# Patient Record
Sex: Male | Born: 1950 | Race: White | Hispanic: No | Marital: Married | Smoking: Former smoker
Health system: Southern US, Community
[De-identification: ages and names within clinical notes are randomized; demographics above are authoritative.]

## PROBLEM LIST (undated history)

## (undated) DIAGNOSIS — F419 Anxiety disorder, unspecified: Secondary | ICD-10-CM

## (undated) DIAGNOSIS — N289 Disorder of kidney and ureter, unspecified: Secondary | ICD-10-CM

## (undated) DIAGNOSIS — M1990 Unspecified osteoarthritis, unspecified site: Secondary | ICD-10-CM

## (undated) DIAGNOSIS — F32A Depression, unspecified: Secondary | ICD-10-CM

## (undated) DIAGNOSIS — I10 Essential (primary) hypertension: Secondary | ICD-10-CM

## (undated) DIAGNOSIS — E785 Hyperlipidemia, unspecified: Secondary | ICD-10-CM

## (undated) DIAGNOSIS — Z87442 Personal history of urinary calculi: Secondary | ICD-10-CM

## (undated) DIAGNOSIS — G4730 Sleep apnea, unspecified: Secondary | ICD-10-CM

## (undated) DIAGNOSIS — D649 Anemia, unspecified: Secondary | ICD-10-CM

## (undated) DIAGNOSIS — N189 Chronic kidney disease, unspecified: Secondary | ICD-10-CM

## (undated) DIAGNOSIS — G2581 Restless legs syndrome: Secondary | ICD-10-CM

## (undated) DIAGNOSIS — K219 Gastro-esophageal reflux disease without esophagitis: Secondary | ICD-10-CM

## (undated) DIAGNOSIS — I219 Acute myocardial infarction, unspecified: Secondary | ICD-10-CM

## (undated) HISTORY — DX: Disorder of kidney and ureter, unspecified: N28.9

## (undated) HISTORY — PX: BACK SURGERY: SHX140

## (undated) HISTORY — PX: CATARACT EXTRACTION: SUR2

## (undated) HISTORY — PX: WRIST SURGERY: SHX841

---

## 1981-10-26 DIAGNOSIS — N289 Disorder of kidney and ureter, unspecified: Secondary | ICD-10-CM

## 1981-10-26 HISTORY — PX: OTHER SURGICAL HISTORY: SHX169

## 1981-10-26 HISTORY — DX: Disorder of kidney and ureter, unspecified: N28.9

## 2002-03-24 ENCOUNTER — Ambulatory Visit (HOSPITAL_COMMUNITY): Admission: RE | Admit: 2002-03-24 | Discharge: 2002-03-24 | Payer: Self-pay | Admitting: Internal Medicine

## 2005-01-09 ENCOUNTER — Ambulatory Visit: Payer: Self-pay | Admitting: Internal Medicine

## 2005-02-09 ENCOUNTER — Ambulatory Visit: Payer: Self-pay | Admitting: Internal Medicine

## 2005-02-23 ENCOUNTER — Ambulatory Visit (HOSPITAL_BASED_OUTPATIENT_CLINIC_OR_DEPARTMENT_OTHER): Admission: RE | Admit: 2005-02-23 | Discharge: 2005-02-23 | Payer: Self-pay | Admitting: Internal Medicine

## 2005-03-01 ENCOUNTER — Ambulatory Visit: Payer: Self-pay | Admitting: Internal Medicine

## 2005-03-09 ENCOUNTER — Ambulatory Visit: Payer: Self-pay | Admitting: Internal Medicine

## 2005-04-06 ENCOUNTER — Ambulatory Visit: Payer: Self-pay | Admitting: Internal Medicine

## 2005-10-12 ENCOUNTER — Ambulatory Visit: Payer: Self-pay | Admitting: Internal Medicine

## 2005-11-27 ENCOUNTER — Ambulatory Visit (HOSPITAL_COMMUNITY): Admission: RE | Admit: 2005-11-27 | Discharge: 2005-11-27 | Payer: Self-pay | Admitting: Pulmonary Disease

## 2005-12-16 ENCOUNTER — Ambulatory Visit: Payer: Self-pay | Admitting: Cardiology

## 2005-12-28 ENCOUNTER — Ambulatory Visit: Payer: Self-pay

## 2006-01-04 ENCOUNTER — Ambulatory Visit: Payer: Self-pay | Admitting: Cardiology

## 2006-01-08 ENCOUNTER — Ambulatory Visit: Payer: Self-pay | Admitting: Cardiology

## 2006-01-08 ENCOUNTER — Inpatient Hospital Stay (HOSPITAL_BASED_OUTPATIENT_CLINIC_OR_DEPARTMENT_OTHER): Admission: RE | Admit: 2006-01-08 | Discharge: 2006-01-08 | Payer: Self-pay | Admitting: Cardiology

## 2006-08-03 ENCOUNTER — Ambulatory Visit: Payer: Self-pay | Admitting: Internal Medicine

## 2006-09-03 ENCOUNTER — Ambulatory Visit: Payer: Self-pay | Admitting: Internal Medicine

## 2007-08-29 ENCOUNTER — Ambulatory Visit: Payer: Self-pay | Admitting: Internal Medicine

## 2007-11-25 ENCOUNTER — Ambulatory Visit (HOSPITAL_COMMUNITY): Admission: RE | Admit: 2007-11-25 | Discharge: 2007-11-25 | Payer: Self-pay | Admitting: Pulmonary Disease

## 2008-05-14 ENCOUNTER — Ambulatory Visit (HOSPITAL_COMMUNITY): Admission: RE | Admit: 2008-05-14 | Discharge: 2008-05-14 | Payer: Self-pay | Admitting: Pulmonary Disease

## 2008-06-21 ENCOUNTER — Ambulatory Visit (HOSPITAL_COMMUNITY): Admission: RE | Admit: 2008-06-21 | Discharge: 2008-06-21 | Payer: Self-pay | Admitting: Preventative Medicine

## 2008-08-29 ENCOUNTER — Encounter: Admission: RE | Admit: 2008-08-29 | Discharge: 2008-08-29 | Payer: Self-pay | Admitting: Preventative Medicine

## 2008-09-13 ENCOUNTER — Encounter: Admission: RE | Admit: 2008-09-13 | Discharge: 2008-09-13 | Payer: Self-pay | Admitting: Preventative Medicine

## 2008-10-05 ENCOUNTER — Encounter: Admission: RE | Admit: 2008-10-05 | Discharge: 2008-10-05 | Payer: Self-pay | Admitting: Preventative Medicine

## 2008-10-26 HISTORY — PX: OTHER SURGICAL HISTORY: SHX169

## 2009-07-12 ENCOUNTER — Encounter: Admission: RE | Admit: 2009-07-12 | Discharge: 2009-07-12 | Payer: Self-pay | Admitting: Neurological Surgery

## 2009-08-23 ENCOUNTER — Encounter: Admission: RE | Admit: 2009-08-23 | Discharge: 2009-08-23 | Payer: Self-pay | Admitting: Neurological Surgery

## 2009-11-19 ENCOUNTER — Encounter: Admission: RE | Admit: 2009-11-19 | Discharge: 2009-11-19 | Payer: Self-pay | Admitting: Neurological Surgery

## 2011-03-10 ENCOUNTER — Other Ambulatory Visit: Payer: Self-pay | Admitting: Neurological Surgery

## 2011-03-10 DIAGNOSIS — M542 Cervicalgia: Secondary | ICD-10-CM

## 2011-03-13 NOTE — Assessment & Plan Note (Signed)
[name]                               PULMONARY OFFICE NOTE   [name], [name]                      MRN:          [id number]  DATE:08/05/2006                            DOB:          02-05-1951    PULMONARY SLEEP FOLLOWUP   DATE OF VISIT:  August 03, 2006.   PRIMARY CARE PHYSICIAN:  Johnny L. Juanetta Gosling, MD.   PRIMARY CARDIOLOGIST:  Johnny Sans. Wall, MD, [facility].   PROBLEM:  1. Obstructive sleep apnea.  2. Periodic limb movement with arousal.   HISTORY:  He was last here in December when he said he was doing great on  CPAP at 17 CWP.  Since then, he has had cataract surgeries and a negative  heart cath.  He says CPAP worked great for a year with no daytime  sleepiness, then seemed to stop working.  By this, he means he began waking  up around 3 a.m., tossing and turning, and then feeling tired during the day  the way he did before he started CPAP.  He denies any discomfort from the  CPAP system, and he denies that he is snoring.  He also denies that he is  under any particular stresses or reason to be waking up at night.  He does  have Ambien.  He went on a three-week trip to [place] without taking his CPAP  and says he slept great as long as he took Ambien.  I discussed this and  explained to him that the Ambien would make it harder for him to wake when  he needed to get a breath.  Since he is not sleeping as well, he says he is  eating more and exercising less and is thus putting on weight.   MEDICATIONS:  1. Elavil 25 mg.  2. Lipitor.  3. CPAP at 17 CWP.  4. Ambien 5 to 10 mg p.r.n.   No medication allergy.   OBJECTIVE:  Weight 225 pounds, BP 102/62, pulse 71, room air saturation 95%.  He is alert, somewhat overweight, but down 12 pounds from December.  There  is no neck vein distention or stridor; no obvious nasal congestion.  Lungs  are clear.   IMPRESSION:  Obstructive sleep apnea with an insomnia component.   PLAN:  We are  having [facility] do an auto-titration to check his  pressure control.  I discussed treatment options.  Scheduled a return in one  month, earlier p.r.n.       Johnny D. Maple Hudson, MD, FCCP, FACP      CDY/MedQ  DD:  08/05/2006  DT:  08/07/2006  Job #:  [id number]   cc:   [name], M.D.  Johnny C. Wall, MD, [facility]

## 2011-03-13 NOTE — Assessment & Plan Note (Signed)
[facility]                               PULMONARY OFFICE NOTE   [name]                      MRN:          [id number]  DATE:09/03/2006                            DOB:          1951/08/31    PULMONARY/SLEEP FOLLOWUP   PRIMARY CARE:  Dr. Kari Baars.   CARDIOLOGIST:  Dr. Valera Castle.   PROBLEMS:  1. Obstructive sleep apnea.  2. Periodic limb movement with arousal.   HISTORY:  He says he still is not sleeping as well as he did during the  first 6 or 8 months of CPAP, but he has done better since we reduced his  pressure to 12 CWP after auto-titration.  Sleep onset is okay, but he is  waking around 2 or 3 a.m. for a few hours.  This is prevented if he takes  1/2 of a 5 mg Ambien tablet, but he was not sure about long-term use.  He  does not recognize causes for sleep disturbance.  He is comfortable with his  CPAP and told that he is not snoring through it.  We spent time discussing  sleep hygiene, management of sleep apnea, and of insomnia, and a discussion  of sleep medication use.   MEDICATIONS:  1. Elavil 25 mg.  2. Lipitor.  3. CPAP at 12 CWP.  4. Ambien 1/2 x5 mg.   ALLERGIES:  No medication allergy.   OBJECTIVE:  Weight 225 pounds, which is stable.  BP 124/78, pulse regular  77, room air saturation 97%.  He looks alert and comfortable.  There are no pressure marks on his face from the mask and no obvious nasal  congestion.  No tremor.  Pulses regular.   IMPRESSION:  1. Obstructive sleep apnea, continuous positive airway pressure of 12      seems adequate, but if there is any possibility that he would be more      comfortable at a lower pressure without breakthrough, we agreed we      would try it.  2. Waking after sleep onset/insomnia, nonspecific.   PLAN:  1. [name] will reduce his CPAP from 12 to 11 CWP for trial.  2. We discussed alternatives to the Ambien, suggesting he might want to  try Tylenol P.M. occasionally, but I also gave him permission to try      increasing his Elavil to 50 mg at bedtime for trial mainly to breakup      the emotional expectation of dependence on Ambien.  3. Schedule return 1 year, earlier p.r.n., but he is encouraged to call if      he has problems with any of this.     Johnny D. Maple Hudson, MD, [name], FACP  Electronically Signed    CDY/MedQ  DD: 09/05/2006  DT: 09/05/2006  Job #: [id number]   cc:   [name], M.D.

## 2011-03-13 NOTE — Assessment & Plan Note (Signed)
[facility]                             PULMONARY OFFICE NOTE   [name], [name]                      MRN:          [id number]  DATE:08/29/2007                            DOB:          [birth year]    PROBLEM:  1. Obstructive sleep apnea.  2. Periodic limb movement with arousal.   HISTORY:  A one-year followup.  He feels he is doing well.  He is taking  1/2 of an Ambien 10 mg tab most nights for sleep, but with this  comfortable with CPAP at 11 CWP.  He gets at least seven hours of sleep  and feels well with this.  Complains of weight gain despite exercise,  which we discussed.  He has had a flu vaccine.   MEDICATIONS:  1. Elavil 25 mg.  2. CPAP at 11 CWP.  3. Crestor.  4. Ambien.   ALLERGIES:  No known drug allergies.   OBJECTIVE:  VITAL SIGNS:  Weight 239 pounds, blood pressure 136/70,  pulse 82, room air saturation 98%.  GENERAL:  He is overweight, alert.  HEENT:  There are no pressures on his face from CPAP.  Palate spacing is  2/4.  LUNGS:  Fields are clear.  HEART:  Pulse is regular.   IMPRESSION:  Obstructive sleep apnea, well-controlled, but I am  concerned that he has gained some weight and this was discussed.   PLAN:  Weight loss.  Try Tylenol PM occasionally if needed as an  alternative to Ambien, with sleep hygiene discussion and medication  discussion.   FOLLOWUP:  Schedule a return appointment in one year or earlier p.r.n.     Johnny D. Maple Hudson, MD, [name], FACP  Electronically Signed    CDY/MedQ  DD: 09/03/2007  DT: 09/04/2007  Job #: [id number]   cc:   Ramon Dredge L. Juanetta Gosling, M.D.

## 2011-03-13 NOTE — Procedures (Signed)
NAMEEATHON, VALADE               ACCOUNT NO.:  [id number]   MEDICAL RECORD NO.:  [id number]          PATIENT TYPE:  OUT   LOCATION:  SLEEP CENTER                 FACILITY:  [facility]   PHYSICIAN:  Clinton D. Maple Hudson, M.D. DATE OF BIRTH:  04/22/1951   DATE OF STUDY:  02/23/2005                              NOCTURNAL POLYSOMNOGRAM   STUDY DATE:  Feb 23, 2005   REFERRING PHYSICIAN:  Dr. Jetty Duhamel   INDICATION FOR STUDY:  Hypersomnia with sleep apnea.  Epworth Sleepiness  Score 13/24, BMI 33, weight 215 pounds.   SLEEP ARCHITECTURE:  Total sleep time 411 minutes with sleep efficiency 86%.  Stage I was 4%, stage II 44%, stages III and IV were absent, REM was 32% of  total sleep time.  Sleep latency 26 minutes, REM latency 71 minutes, awake  after sleep onset 43 minutes, arousal index increased at 35.  He took Elavil  at bedtime.   RESPIRATORY DATA:  Split-study protocol.  Respiratory disturbance index  (RDI, AHI) 58.2 obstructive events per hour indicating severe obstructive  sleep apnea/hypopnea syndrome before CPAP.  There was 90 obstructive apneas  and 25 hypopneas before CPAP.  Events were most common while lying supine.  REM RDI 51.  CPAP was titrated to 17 CWP, RDI 1.5 per hour.  A ResMed Swift  with medium nasal pillows was used, with a heated humidifier.   OXYGEN DATA:  Moderate to loud snoring with oxygen desaturation to a nadir  of 78% before CPAP.  After CPAP control saturation held 97-98% on room air.   CARDIAC DATA:  Normal sinus rhythm.   MOVEMENT/PARASOMNIA:  A total of 24 limb jerks were recorded of which 15  were associated with arousal or awakening for a periodic limb movement with  arousal index of 2.2 per hour which was mildly increased.   IMPRESSION/RECOMMENDATION:  1.  Severe obstructive sleep apnea/hypopnea syndrome, respiratory      disturbance index 58.2 per hour with moderately loud snoring and oxygen      desaturation to a nadir of 78%.  2.  Continuous  positive airway pressure titration to 17 CWP, respiratory      disturbance index 1.5 per hour using a ResMed Swift with medium nasal      pillows and a heated humidifier.      CDY/MEDQ  D:  03/01/2005 10:24:07  T:  03/01/2005 11:41:37  Job:  [id number]

## 2011-03-13 NOTE — Procedures (Signed)
[name], [name]               ACCOUNT NO.:  [id number]   MEDICAL RECORD NO.:  [id number]          PATIENT TYPE:  OUT   LOCATION:  RAD                           FACILITY:  [facility]   PHYSICIAN:  Edward L. Juanetta Gosling, M.D.DATE OF BIRTH:  12/12/1950   DATE OF PROCEDURE:  DATE OF DISCHARGE:                              PULMONARY FUNCTION TEST   1.  Spirometry is normal.  2.  Lung volumes are normal.  3.  DLCO is normal.  4.  Arterial blood gases are normal.      Edward L. Juanetta Gosling, M.D.  Electronically Signed     ELH/MEDQ  D:  11/28/2005  T:  11/28/2005  Job:  [id number]

## 2011-03-13 NOTE — Cardiovascular Report (Signed)
[name], [name]               ACCOUNT NO.:  [id number]   MEDICAL RECORD NO.:  [id number]          PATIENT TYPE:  OIB   LOCATION:  1962                         FACILITY:  [facility]   PHYSICIAN:  Charlies Constable, M.D. 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  He has no prior history of known heart  disease but has had some dyspnea on exertion.  He attributed this to some  increased weight but he saw Dr. Juanetta Gosling and then Dr. Daleen Squibb in referral and  Dr. Daleen Squibb did a Cardiolite scan which was borderline for inferior ischemia.  Dr. Daleen Squibb arranged for him to be studied today in the outpatient laboratory.   PROCEDURE:  The procedure was performed via the right femoral artery using  arterial sheath and 4 French preformed coronary catheters.  A front wall  arterial puncture was performed and Omnipaque contrast was used.  The  patient tolerated the procedure well and left the laboratory in satisfactory  condition.   RESULTS:  The aortic pressure was 104/68 with a mean of 85.  The left  ventricular  pressure is 104/17.   Left main coronary artery was free of significant disease.   Left anterior descending artery gave rise to two diagonal branches and two  septal perforators.  There was minimal irregularity in the LAD but no  significant obstruction.   The circumflex artery was a large dominant vessel that gave rise to a  marginal branch, three posterolateral branches, an atrial branch, and a  posterior descending branch.  These vessels were free of significant  disease.   The right coronary artery is a nondominant vessel and supplies two right  ventricular branches.  These vessels were free of significant disease.   The left ventriculogram performed in the RAO projection showed  good wall  motion with no areas of hypokinesis.  The estimated ejection fraction was  60%.   A distal aortogram was performed which showed patent renal arteries and no  significant aorto-iliac obstruction.   CONCLUSION:  Normal coronary angiography and left ventricular wall motion.   RECOMMENDATIONS:  Reassurance.  In view of these findings, I think there is  no cardiac etiology to explain the patient's dyspnea on exertion.  I will  plan to continue primary risk factor modification and follow up with Dr.  Juanetta Gosling.           ______________________________  Charlies Constable, M.D. [facility]     BB/MEDQ  D:  01/08/2006  T:  01/09/2006  Job:  [id number]   cc:   Ramon Dredge L. Juanetta Gosling, M.D.  Fax: [phone]   Jesse Sans. Wall, M.D.  [address]  [address]  [place]  [place] [address]

## 2011-03-20 ENCOUNTER — Ambulatory Visit
Admission: RE | Admit: 2011-03-20 | Discharge: 2011-03-20 | Disposition: A | Discharge status: Home / Self Care | Payer: PRIVATE HEALTH INSURANCE | Source: Ambulatory Visit | Attending: Neurological Surgery | Admitting: Neurological Surgery

## 2011-03-20 DIAGNOSIS — M542 Cervicalgia: Secondary | ICD-10-CM

## 2011-04-22 ENCOUNTER — Other Ambulatory Visit (HOSPITAL_COMMUNITY): Payer: Self-pay | Admitting: Pulmonary Disease

## 2011-04-22 DIAGNOSIS — R7989 Other specified abnormal findings of blood chemistry: Secondary | ICD-10-CM

## 2011-04-24 ENCOUNTER — Ambulatory Visit (HOSPITAL_COMMUNITY)
Admission: RE | Admit: 2011-04-24 | Discharge: 2011-04-24 | Disposition: A | Discharge status: Home / Self Care | Payer: PRIVATE HEALTH INSURANCE | Source: Ambulatory Visit | Attending: Pulmonary Disease | Admitting: Pulmonary Disease

## 2011-04-24 ENCOUNTER — Other Ambulatory Visit (HOSPITAL_COMMUNITY): Payer: Self-pay | Admitting: Pulmonary Disease

## 2011-04-24 DIAGNOSIS — M25519 Pain in unspecified shoulder: Secondary | ICD-10-CM

## 2011-04-24 DIAGNOSIS — M546 Pain in thoracic spine: Secondary | ICD-10-CM | POA: Insufficient documentation

## 2011-04-24 DIAGNOSIS — R7989 Other specified abnormal findings of blood chemistry: Secondary | ICD-10-CM

## 2011-04-24 DIAGNOSIS — N1330 Unspecified hydronephrosis: Secondary | ICD-10-CM | POA: Insufficient documentation

## 2011-04-24 DIAGNOSIS — R944 Abnormal results of kidney function studies: Secondary | ICD-10-CM | POA: Insufficient documentation

## 2011-04-24 DIAGNOSIS — M549 Dorsalgia, unspecified: Secondary | ICD-10-CM

## 2011-07-24 ENCOUNTER — Ambulatory Visit (HOSPITAL_BASED_OUTPATIENT_CLINIC_OR_DEPARTMENT_OTHER)
Admission: RE | Admit: 2011-07-24 | Discharge: 2011-07-24 | Disposition: A | Discharge status: Home / Self Care | Payer: PRIVATE HEALTH INSURANCE | Source: Ambulatory Visit | Attending: Urology | Admitting: Urology

## 2011-07-24 DIAGNOSIS — N1330 Unspecified hydronephrosis: Secondary | ICD-10-CM | POA: Insufficient documentation

## 2011-07-24 DIAGNOSIS — N135 Crossing vessel and stricture of ureter without hydronephrosis: Secondary | ICD-10-CM | POA: Insufficient documentation

## 2011-07-24 LAB — POCT I-STAT 4, (NA,K, GLUC, HGB,HCT)
Glucose, Bld: 97 mg/dL (ref 70–99)
HCT: 39 % (ref 39.0–52.0)
Hemoglobin: 13.3 g/dL (ref 13.0–17.0)
Potassium: 3.8 mEq/L (ref 3.5–5.1)
Sodium: 143 mEq/L (ref 135–145)

## 2011-08-02 NOTE — Op Note (Signed)
[name], [name]               ACCOUNT NO.:  [id number]  MEDICAL RECORD NO.:  [id number]  LOCATION:                                 FACILITY:  PHYSICIAN:  Nevaeha Finerty C. Vernie Ammons, M.D.  DATE OF BIRTH:  [birth year]  DATE OF PROCEDURE:  07/24/2011 DATE OF DISCHARGE:                              OPERATIVE REPORT   PREOPERATIVE DIAGNOSIS:  Right hydronephrosis of undetermined etiology.  POSTOPERATIVE DIAGNOSIS:  Right ureteropelvic junction obstruction.  SURGEON:  Taziyah Iannuzzi C. Vernie Ammons, MD  ANESTHESIA:  General.  BLOOD LOSS:  None.  DRAINS:  None.  SPECIMENS:  None.  COMPLICATIONS:  None.  INDICATIONS:  The patient is a 60 year old who was found to have an elevated creatinine of 1.9.  A renal ultrasound was obtained that revealed marked hydronephrosis on the right-hand side with very little parenchyma present and severe blunting of the calyces, indicating longstanding obstruction.  The patient reports having undergone a right ureterolithotomy by Dr. Vonita Moss in 1983, although operative notes are not available to confirm the location of the previous surgery.  His wife indicated the stones were located in the kidney.  The need to evaluate the cause of his obstruction and location was discussed as well as the procedure in detail.  The patient understands and has elected to proceed.  DESCRIPTION OF OPERATION:  After informed consent, the patient was brought to the major OR, placed on the table, and administered general anesthesia and then moved to the dorsal lithotomy position.  His genitalia was sterilely prepped and draped.  An official time-out was then performed.  Initially, the 22-French cystoscope with 12-degree lens was passed under direct vision down the urethra which was noted to be entirely normal. Prostatic urethra revealed no lesions.  The lateral lobes were nonobstructing.  He did have a slightly high bladder neck/median lobe, but this was nonobstructing.  Upon entering  the bladder, it was fully and systematically inspected, noted to be free of any tumor, stones, or inflammatory lesions.  Ureteral orifices were of normal configuration and position.  A 6-French cone-tip catheter was then passed through the cystoscope and a right retrograde pyelogram was performed in a standard fashion.  I injected full-strength contrast under direct fluoroscopic control and watched the contrast as it passed up the ureter which appeared entirely normal throughout its course without filling defect or mass effect.  As the ureter met the renal pelvic region, I noted jetting of the contrast into the renal pelvis which was capacious.  This appeared to be free of any mass effect or filling defect.  I then removed the cone-tip catheter and passed an open-ended 6-French ureteral catheter through the cystoscope and then guidewire through this and up the ureter under fluoroscopy and noted this passed easily into the area of the renal pelvis.  I passed the open-ended catheter up to the renal pelvic region and into the renal pelvis and removed the guidewire and noted hydronephrotic drip.  I then injected contrast as I pulled the 6-French open-ended catheter down the ureter and through the UPJ which helped to me delineate this and I noted it was somewhat tortuous just below the UPJ, but again no filling defects or  worrisome findings were noted.  I, therefore, removed the open-ended ureteral catheter, drained the bladder, and the patient was awakened and taken to recovery room in stable and satisfactory condition.  He tolerated the procedure well, there were no intraoperative complications.  At this point, it would appear that he has a benign right UPJ obstruction whether this is congenital or equally possible acquired due to his previous surgery is undetermined, but of no clinical significance at this point since the right kidney has virtually no significant parenchymal mass and  therefore a UPJ repair would not be indicated.  I discussed that with the patient's wife and at this point, he will return to see me on an as-needed basis.     Betul Brisky C. Vernie Ammons, M.D.     MCO/MEDQ  D:  07/24/2011  T:  07/24/2011  Job:  [id number]  cc:   Ramon Dredge L. Juanetta Gosling, M.D. Fax: [phone]  Electronically Signed by Ihor Gully M.D. on 08/02/2011 03:09:48 PM

## 2011-10-14 ENCOUNTER — Other Ambulatory Visit: Payer: Self-pay | Admitting: Neurological Surgery

## 2011-10-14 DIAGNOSIS — M542 Cervicalgia: Secondary | ICD-10-CM

## 2011-10-23 ENCOUNTER — Other Ambulatory Visit: Payer: PRIVATE HEALTH INSURANCE

## 2011-10-23 ENCOUNTER — Ambulatory Visit
Admission: RE | Admit: 2011-10-23 | Discharge: 2011-10-23 | Disposition: A | Discharge status: Home / Self Care | Payer: PRIVATE HEALTH INSURANCE | Source: Ambulatory Visit | Attending: Neurological Surgery | Admitting: Neurological Surgery

## 2011-10-23 DIAGNOSIS — M542 Cervicalgia: Secondary | ICD-10-CM

## 2011-10-23 MED ORDER — TRIAMCINOLONE ACETONIDE 40 MG/ML IJ SUSP (RADIOLOGY)
60.0000 mg | Freq: Once | INTRAMUSCULAR | Status: AC
  Administered 2011-10-23: 60 mg via EPIDURAL

## 2011-10-23 MED ORDER — IOHEXOL 300 MG/ML  SOLN
1.0000 mL | Freq: Once | INTRAMUSCULAR | Status: AC | PRN
  Administered 2011-10-23: 1 mL via EPIDURAL

## 2011-10-23 NOTE — Progress Notes (Signed)
1145  Ambulates alone, gait steady.  Denies numbness or tingling of extremities.  Given verbal & written discharge instructions & states that he understands.  1150 Dr Karin Golden here to see patient.  1205  Patient's wife available to drive.  Discharged to home.

## 2011-11-22 DIAGNOSIS — I10 Essential (primary) hypertension: Secondary | ICD-10-CM | POA: Insufficient documentation

## 2011-12-01 ENCOUNTER — Other Ambulatory Visit (HOSPITAL_COMMUNITY): Payer: Self-pay | Admitting: Pulmonary Disease

## 2011-12-01 DIAGNOSIS — R591 Generalized enlarged lymph nodes: Secondary | ICD-10-CM

## 2011-12-03 ENCOUNTER — Ambulatory Visit (HOSPITAL_COMMUNITY)
Admission: RE | Admit: 2011-12-03 | Discharge: 2011-12-03 | Disposition: A | Discharge status: Home / Self Care | Payer: PRIVATE HEALTH INSURANCE | Source: Ambulatory Visit | Attending: Pulmonary Disease | Admitting: Pulmonary Disease

## 2011-12-03 DIAGNOSIS — R599 Enlarged lymph nodes, unspecified: Secondary | ICD-10-CM | POA: Insufficient documentation

## 2011-12-03 DIAGNOSIS — R591 Generalized enlarged lymph nodes: Secondary | ICD-10-CM

## 2012-01-20 ENCOUNTER — Encounter (INDEPENDENT_AMBULATORY_CARE_PROVIDER_SITE_OTHER): Payer: Self-pay

## 2012-02-23 ENCOUNTER — Encounter (INDEPENDENT_AMBULATORY_CARE_PROVIDER_SITE_OTHER): Payer: Self-pay | Admitting: *Deleted

## 2012-02-24 ENCOUNTER — Other Ambulatory Visit: Payer: Self-pay | Admitting: Neurological Surgery

## 2012-02-24 DIAGNOSIS — M542 Cervicalgia: Secondary | ICD-10-CM

## 2012-02-26 ENCOUNTER — Ambulatory Visit
Admission: RE | Admit: 2012-02-26 | Discharge: 2012-02-26 | Disposition: A | Discharge status: Home / Self Care | Payer: PRIVATE HEALTH INSURANCE | Source: Ambulatory Visit | Attending: Neurological Surgery | Admitting: Neurological Surgery

## 2012-02-26 VITALS — BP 188/96 | HR 84

## 2012-02-26 DIAGNOSIS — M542 Cervicalgia: Secondary | ICD-10-CM

## 2012-02-26 MED ORDER — TRIAMCINOLONE ACETONIDE 40 MG/ML IJ SUSP (RADIOLOGY)
60.0000 mg | Freq: Once | INTRAMUSCULAR | Status: AC
  Administered 2012-02-26: 60 mg via EPIDURAL

## 2012-02-26 MED ORDER — IOHEXOL 300 MG/ML  SOLN
1.0000 mL | Freq: Once | INTRAMUSCULAR | Status: AC | PRN
  Administered 2012-02-26: 1 mL via EPIDURAL

## 2012-03-01 ENCOUNTER — Other Ambulatory Visit (HOSPITAL_COMMUNITY): Payer: Self-pay | Admitting: Pulmonary Disease

## 2012-03-01 DIAGNOSIS — R591 Generalized enlarged lymph nodes: Secondary | ICD-10-CM

## 2012-03-02 ENCOUNTER — Encounter (HOSPITAL_COMMUNITY): Payer: Self-pay

## 2012-03-02 ENCOUNTER — Ambulatory Visit (HOSPITAL_COMMUNITY)
Admission: RE | Admit: 2012-03-02 | Discharge: 2012-03-02 | Disposition: A | Discharge status: Home / Self Care | Payer: PRIVATE HEALTH INSURANCE | Source: Ambulatory Visit | Attending: Pulmonary Disease | Admitting: Pulmonary Disease

## 2012-03-02 DIAGNOSIS — R599 Enlarged lymph nodes, unspecified: Secondary | ICD-10-CM | POA: Insufficient documentation

## 2012-03-02 DIAGNOSIS — R591 Generalized enlarged lymph nodes: Secondary | ICD-10-CM

## 2012-03-02 DIAGNOSIS — K5710 Diverticulosis of small intestine without perforation or abscess without bleeding: Secondary | ICD-10-CM | POA: Insufficient documentation

## 2012-03-30 ENCOUNTER — Telehealth (INDEPENDENT_AMBULATORY_CARE_PROVIDER_SITE_OTHER): Payer: Self-pay | Admitting: *Deleted

## 2012-03-30 ENCOUNTER — Other Ambulatory Visit (INDEPENDENT_AMBULATORY_CARE_PROVIDER_SITE_OTHER): Payer: Self-pay | Admitting: *Deleted

## 2012-03-30 DIAGNOSIS — Z1211 Encounter for screening for malignant neoplasm of colon: Secondary | ICD-10-CM

## 2012-03-30 NOTE — Telephone Encounter (Signed)
Patient needs movi prep 

## 2012-04-01 MED ORDER — PEG-KCL-NACL-NASULF-NA ASC-C 100 G PO SOLR: 1.0000 | Freq: Once | ORAL | Status: DC

## 2012-05-11 ENCOUNTER — Encounter (HOSPITAL_COMMUNITY): Payer: Self-pay | Admitting: Pharmacy Technician

## 2012-05-16 ENCOUNTER — Telehealth (INDEPENDENT_AMBULATORY_CARE_PROVIDER_SITE_OTHER): Payer: Self-pay | Admitting: *Deleted

## 2012-05-16 NOTE — Telephone Encounter (Signed)
PCP/Requesting MD: Juanetta Gosling  Name & DOB: [name] 12/16/1950     Procedure: tcs  Reason/Indication:  screening  Has patient had this procedure before?  yes  If so, when, by whom and where?  2003  Is there a family history of colon cancer?  no  Who?  What age when diagnosed?    Is patient diabetic?   no      Does patient have prosthetic heart valve?  no  Do you have a pacemaker?  no  Has patient had joint replacement within last 12 months?  no  Is patient on Coumadin, Plavix and/or Aspirin? yes  Medications: asa 81 mg daily, crestor 10 mg daily, amitriptyline 25 mg daily, tramadol 50 mg bid, ambien 10 mg 4-5 nights a week, androgel daily, omeprazole 20 mg bid, multi vit, flax seed oil  Allergies: nkda  Medication Adjustment: asa 2 days  Procedure date & time: 05/25/12 at 1030

## 2012-05-16 NOTE — Telephone Encounter (Signed)
Agree 

## 2012-05-25 ENCOUNTER — Encounter (HOSPITAL_COMMUNITY): Payer: Self-pay | Admitting: *Deleted

## 2012-05-25 ENCOUNTER — Ambulatory Visit (HOSPITAL_COMMUNITY)
Admission: RE | Admit: 2012-05-25 | Discharge: 2012-05-25 | Disposition: A | Discharge status: Home / Self Care | Payer: PRIVATE HEALTH INSURANCE | Source: Ambulatory Visit | Attending: Internal Medicine | Admitting: Internal Medicine

## 2012-05-25 ENCOUNTER — Encounter (HOSPITAL_COMMUNITY)
Admission: RE | Disposition: A | Discharge status: Home / Self Care | Payer: Self-pay | Source: Ambulatory Visit | Attending: Internal Medicine

## 2012-05-25 DIAGNOSIS — K5730 Diverticulosis of large intestine without perforation or abscess without bleeding: Secondary | ICD-10-CM

## 2012-05-25 DIAGNOSIS — K644 Residual hemorrhoidal skin tags: Secondary | ICD-10-CM

## 2012-05-25 DIAGNOSIS — Z1211 Encounter for screening for malignant neoplasm of colon: Secondary | ICD-10-CM

## 2012-05-25 DIAGNOSIS — E785 Hyperlipidemia, unspecified: Secondary | ICD-10-CM | POA: Insufficient documentation

## 2012-05-25 DIAGNOSIS — D126 Benign neoplasm of colon, unspecified: Secondary | ICD-10-CM | POA: Insufficient documentation

## 2012-05-25 DIAGNOSIS — G4733 Obstructive sleep apnea (adult) (pediatric): Secondary | ICD-10-CM | POA: Insufficient documentation

## 2012-05-25 HISTORY — DX: Hyperlipidemia, unspecified: E78.5

## 2012-05-25 HISTORY — DX: Unspecified osteoarthritis, unspecified site: M19.90

## 2012-05-25 HISTORY — DX: Gastro-esophageal reflux disease without esophagitis: K21.9

## 2012-05-25 HISTORY — DX: Sleep apnea, unspecified: G47.30

## 2012-05-25 HISTORY — PX: COLONOSCOPY: SHX5424

## 2012-05-25 SURGERY — COLONOSCOPY
Anesthesia: Moderate Sedation

## 2012-05-25 MED ORDER — MIDAZOLAM HCL 5 MG/5ML IJ SOLN
INTRAMUSCULAR | Status: DC | PRN
  Administered 2012-05-25 (×3): 2 mg via INTRAVENOUS

## 2012-05-25 MED ORDER — STERILE WATER FOR IRRIGATION IR SOLN
Status: DC | PRN
  Administered 2012-05-25: 11:00:00

## 2012-05-25 MED ORDER — MIDAZOLAM HCL 5 MG/5ML IJ SOLN
INTRAMUSCULAR | Status: AC
  Filled 2012-05-25: qty 10

## 2012-05-25 MED ORDER — MEPERIDINE HCL 50 MG/ML IJ SOLN
INTRAMUSCULAR | Status: DC | PRN
  Administered 2012-05-25 (×2): 25 mg via INTRAVENOUS

## 2012-05-25 MED ORDER — MEPERIDINE HCL 50 MG/ML IJ SOLN
INTRAMUSCULAR | Status: AC
  Filled 2012-05-25: qty 1

## 2012-05-25 MED ORDER — SODIUM CHLORIDE 0.45 % IV SOLN
Freq: Once | INTRAVENOUS | Status: AC
  Administered 2012-05-25: 1000 mL via INTRAVENOUS

## 2012-05-25 NOTE — H&P (Signed)
[name] is an 61 y.o. male.   Chief Complaint: Patient is here for colonoscopy. HPI: Patient is 61 year old Caucasian male who is in for screening colonoscopy. Patient's last exam was 10 years ago. He denies change in bowel habits or rectal bleeding. Family history is negative for colorectal carcinoma.  Past Medical History  Diagnosis Date  . Hyperlipidemia   . Sleep apnea     uses Cpap  . GERD (gastroesophageal reflux disease)   . Arthritis     Past Surgical History  Procedure Date  . Kidney stone   . Left foot surgery     History reviewed. No pertinent family history. Social History:  reports that he has never smoked. He does not have any smokeless tobacco history on file. He reports that he does not drink alcohol or use illicit drugs.  Allergies: No Known Allergies  Medications Prior to Admission  Medication Sig Dispense Refill  . amitriptyline (ELAVIL) 25 MG tablet Take 25 mg by mouth at bedtime.      Marland Kitchen aspirin EC 81 MG tablet Take 81 mg by mouth daily.      . Flaxseed, Linseed, 1200 MG CAPS Take 1,200 mg by mouth 2 (two) times daily.      . Multiple Vitamins-Minerals (MULTIVITAMIN WITH MINERALS) tablet Take 1 tablet by mouth daily.      Marland Kitchen omeprazole (PRILOSEC) 20 MG capsule Take 20 mg by mouth 2 (two) times daily.      . peg 3350 powder (MOVIPREP) 100 G SOLR Take 1 kit (100 g total) by mouth once.  1 kit  0  . rosuvastatin (CRESTOR) 10 MG tablet Take 10 mg by mouth at bedtime.      . Testosterone (ANDROGEL PUMP) 1.25 GM/ACT (1%) GEL Place 1 % onto the skin daily.      . traMADol (ULTRAM) 50 MG tablet Take 50 mg by mouth 3 (three) times daily.      Marland Kitchen zolpidem (AMBIEN) 10 MG tablet Take 10 mg by mouth at bedtime.        No results found for this or any previous visit (from the past 48 hour(s)). No results found.  ROS  Blood pressure 155/90, pulse 80, temperature 97.9 F (36.6 C), temperature source Oral, resp. rate 18, height 5\' 7"  (1.702 m), weight 225 lb  (102.059 kg), SpO2 96.00%. Physical Exam  Constitutional: He appears well-developed and well-nourished.  HENT:  Mouth/Throat: Oropharynx is clear and moist.  Eyes: Conjunctivae are normal. No scleral icterus.  Neck: No thyromegaly present.  Cardiovascular: Normal rate, regular rhythm and normal heart sounds.   No murmur heard. Respiratory: Effort normal and breath sounds normal.  GI: Soft. He exhibits no distension and no mass. There is no tenderness.  Musculoskeletal: He exhibits no edema.  Lymphadenopathy:    He has no cervical adenopathy.  Neurological: He is alert.  Skin: Skin is warm and dry.     Assessment/Plan Average risk screening colonoscopy.  REHMAN,NAJEEB U 05/25/2012, 10:26 AM

## 2012-05-25 NOTE — Op Note (Signed)
COLONOSCOPY PROCEDURE REPORT  PATIENT:  [name]  MR#:  [id number] Birthdate:  [birth year], 61 y.o., male Endoscopist:  Dr. Malissa Hippo, MD Referred By:  Dr. Oneal Deputy. Juanetta Gosling, MD Procedure Date: 05/25/2012  Procedure:   Colonoscopy  Indications: Patient is 61 year old Caucasian male was undergoing average risk screening colonoscopy.  Informed Consent:  The procedure and risks were reviewed with the patient and informed consent was obtained.  Medications:  Demerol 50 mg IV Versed 6 mg IV  Description of procedure:  After a digital rectal exam was performed, that colonoscope was advanced from the anus through the rectum and colon to the area of the cecum, ileocecal valve and appendiceal orifice. The cecum was deeply intubated. These structures were well-seen and photographed for the record. From the level of the cecum and ileocecal valve, the scope was slowly and cautiously withdrawn. The mucosal surfaces were carefully surveyed utilizing scope tip to flexion to facilitate fold flattening as needed. The scope was pulled down into the rectum where a thorough exam including retroflexion was performed. Terminal ileum was also examined.  Findings:   Prep excellent. Normal terminal ileum. Small polyp ablated via cold biopsy from hepatic flexure. Few scattered diverticula at sigmoid colon. Normal rectal mucosa. Small hemorrhoids below the dentate line.  Therapeutic/Diagnostic Maneuvers Performed:  See above  Complications:  None  Cecal Withdrawal Time:  14 minutes  Impression:  Normal terminal ileum. Small polyp ablated via cold biopsy from hepatic flexure. Few diverticula at sigmoid colon. Small external hemorrhoids.  Recommendations:  Standard instructions given. I will be contacting patient with results of biopsy and further recommendations.  REHMAN,NAJEEB U  05/25/2012 10:58 AM  CC: Dr. Fredirick Maudlin, MD & Dr. Bonnetta Barry ref. provider found

## 2012-05-27 ENCOUNTER — Encounter (HOSPITAL_COMMUNITY): Payer: Self-pay | Admitting: Internal Medicine

## 2012-06-01 ENCOUNTER — Encounter (INDEPENDENT_AMBULATORY_CARE_PROVIDER_SITE_OTHER): Payer: Self-pay | Admitting: *Deleted

## 2012-12-01 ENCOUNTER — Other Ambulatory Visit (HOSPITAL_COMMUNITY): Payer: Self-pay | Admitting: Pulmonary Disease

## 2012-12-01 DIAGNOSIS — I880 Nonspecific mesenteric lymphadenitis: Secondary | ICD-10-CM

## 2012-12-02 ENCOUNTER — Ambulatory Visit (HOSPITAL_COMMUNITY): Payer: PRIVATE HEALTH INSURANCE

## 2012-12-06 ENCOUNTER — Ambulatory Visit (HOSPITAL_COMMUNITY)
Admission: RE | Admit: 2012-12-06 | Discharge: 2012-12-06 | Disposition: A | Discharge status: Home / Self Care | Payer: PRIVATE HEALTH INSURANCE | Source: Ambulatory Visit | Attending: Pulmonary Disease | Admitting: Pulmonary Disease

## 2012-12-06 ENCOUNTER — Other Ambulatory Visit (HOSPITAL_COMMUNITY): Payer: Self-pay | Admitting: Pulmonary Disease

## 2012-12-06 DIAGNOSIS — I880 Nonspecific mesenteric lymphadenitis: Secondary | ICD-10-CM

## 2012-12-06 DIAGNOSIS — M5030 Other cervical disc degeneration, unspecified cervical region: Secondary | ICD-10-CM | POA: Insufficient documentation

## 2012-12-06 DIAGNOSIS — M542 Cervicalgia: Secondary | ICD-10-CM | POA: Insufficient documentation

## 2012-12-06 DIAGNOSIS — M25569 Pain in unspecified knee: Secondary | ICD-10-CM | POA: Insufficient documentation

## 2012-12-06 DIAGNOSIS — K8020 Calculus of gallbladder without cholecystitis without obstruction: Secondary | ICD-10-CM | POA: Insufficient documentation

## 2012-12-06 DIAGNOSIS — M79609 Pain in unspecified limb: Secondary | ICD-10-CM | POA: Insufficient documentation

## 2012-12-20 ENCOUNTER — Ambulatory Visit (INDEPENDENT_AMBULATORY_CARE_PROVIDER_SITE_OTHER): Payer: PRIVATE HEALTH INSURANCE | Admitting: Orthopedic Surgery

## 2012-12-20 ENCOUNTER — Encounter: Payer: Self-pay | Admitting: Orthopedic Surgery

## 2012-12-20 VITALS — BP 124/68 | Ht 68.0 in | Wt 222.0 lb

## 2012-12-20 DIAGNOSIS — G5600 Carpal tunnel syndrome, unspecified upper limb: Secondary | ICD-10-CM | POA: Insufficient documentation

## 2012-12-20 DIAGNOSIS — M23321 Other meniscus derangements, posterior horn of medial meniscus, right knee: Secondary | ICD-10-CM

## 2012-12-20 MED ORDER — GABAPENTIN 100 MG PO CAPS: 100.0000 mg | ORAL_CAPSULE | Freq: Every day | ORAL | Status: DC

## 2012-12-20 MED ORDER — VITAMIN B-6 100 MG PO TABS: 100.0000 mg | ORAL_TABLET | Freq: Two times a day (BID) | ORAL | Status: DC

## 2012-12-20 NOTE — Progress Notes (Signed)
Patient ID: [name], male   DOB: 1951-05-03, 62 y.o.   MRN: [id number]  Chief Complaint  Patient presents with  . Knee Pain    Right knee pain , no injury, referred by Dr.Hawkins   A ring consultation has been requested by Dr. Juanetta Gosling to evaluate the patient for right knee pain and bilateral hand pain and numbness and tingling in bilateral thumb pain  The patient's right knee started hurting 6 months ago and he remembers a specific event. He had his knee in external rotation and slight flexion and a push and then and he felt an acute burning sensation over the medial joint line. He continues to have dull aching pain over the joint line inability to squat and difficulty performing activities such as woodworking which is very important to him  He has bilateral hand pain with numbness and tingling in all the fingers and digits except the small finger. The pain does not radiate from proximal to distal from distal to proximal. He has a history of cervical disc disease stable evaluated by neurosurgery no surgery needed. Feels this is different.  Prior to his hand symptoms he started having pain in both thumbs at the base the joint. This involved grip and pinch activities.  Review of systems 10 systems reviewed all were negative except the neurologic symptom that as described in the musculoskeletal symptoms as described  Past Medical History  Diagnosis Date  . Hyperlipidemia   . Sleep apnea     uses Cpap  . GERD (gastroesophageal reflux disease)   . Arthritis   . Kidney function abnormal     Past Surgical History  Procedure Laterality Date  . Kidney stone    . Left foot surgery    . Colonoscopy  05/25/2012    Procedure: COLONOSCOPY;  Surgeon: Malissa Hippo, MD;  Location: [facility];  Service: Endoscopy;  Laterality: N/A;  1030  . Cataract extraction     The other parameters of the history have been reviewed in Epic.  The vital signs are BP 124/68  Ht 5\' 8"  (1.727 m)  Wt 222  lb (100.699 kg)  BMI 33.76 kg/m2  Gen. appearance overall the patient has a mild amount of obesity with a BMI of 33. He is well-developed nutritional status is normal he has no deformities his grooming is excellent. He is oriented x3 his mood and affect are normal his gait and station appear normal with mild varus. His upper extremities have normal pulses and temperature no edema no swelling no lymph nodes are palpable gross sensation is normal but he does have the sensory deficit in the median nerve distribution. No pathologic reflexes and normal coordination.  On inspection he has no swelling in the hands he has some tenderness over the carpal tunnel range of motion is normal he has tenderness at the basal joints of the thumb. There is some motion there consistent with basal joint arthrosis remaining joints are stable strength is normal skin is intact  Left lower extremity normal range of motion strength stability alignment skin pulse. Normal sensation. No repeat X. abnormalities normal coordination normal pulse.  Right knee and right leg coordination is normal no pathologic reflexes sensation is normal negative for lymphadenopathy normal pulse and temperature normal skin normal strength normal ligaments. Painful range of motion in terminal range of motion positive screw home test, positive Murray's test, positive hyperflexion rotation test. Tenderness along the medial joint line.   X-rays were done of his hand  and his knee and are basically normal he has a little arthritis in the knee a little subluxation at the basal joint of the thumb  Diagnosis CTS (carpal tunnel syndrome), unspecified laterality - Plan: pyridOXINE (VITAMIN B-6) 100 MG tablet, gabapentin (NEURONTIN) 100 MG capsule  Medial meniscus, posterior horn derangement, right  CMC arthritis   Then recommend splinting, medication for his carpal tunnel  Injection for the medial meniscal tear and observation currently for the [facility]  arthritis  We discussed possible surgical option for the knee but he would like to try the injection.  Knee  Injection Procedure Note  Pre-operative Diagnosis: right knee oa  Post-operative Diagnosis: same  Indications: pain  Anesthesia: ethyl chloride   Procedure Details   Verbal consent was obtained for the procedure. Time out was completed.The joint was prepped with alcohol, followed by  Ethyl chloride spray and A 20 gauge needle was inserted into the knee via lateral approach; 4ml 1% lidocaine and 1 ml of depomedrol  was then injected into the joint . The needle was removed and the area cleansed and dressed.  Complications:  None; patient tolerated the procedure well.

## 2012-12-20 NOTE — Patient Instructions (Addendum)
You have received a steroid shot. 15% of patients experience increased pain at the injection site with in the next 24 hours. This is best treated with ice and tylenol extra strength 2 tabs every 8 hours. If you are still having pain please call the office.   Knee sleeve  Braces for carpal tunnel  Gabapentin and B6   Carpal Tunnel Syndrome The carpal tunnel is a narrow area located on the palm side of your wrist. The tunnel is formed by the wrist bones and ligaments. Nerves, blood vessels, and tendons pass through the carpal tunnel. Repeated wrist motion or certain diseases may cause swelling within the tunnel. This swelling pinches the main nerve in the wrist (median nerve) and causes the painful hand and arm condition called carpal tunnel syndrome. CAUSES   Repeated wrist motions.  Wrist injuries.  Certain diseases like arthritis, diabetes, alcoholism, hyperthyroidism, and kidney failure.  Obesity.  Pregnancy. SYMPTOMS   A "pins and needles" feeling in your fingers or hand.  Tingling or numbness in your fingers or hand.  An aching feeling in your entire arm.  Wrist pain that goes up your arm to your shoulder.  Pain that goes down into your palm or fingers.  A weak feeling in your hands. DIAGNOSIS  Your caregiver will take your history and perform a physical exam. An electromyography test may be needed. This test measures electrical signals sent out by the muscles. The electrical signals are usually slowed by carpal tunnel syndrome. You may also need X-rays. TREATMENT  Carpal tunnel syndrome may clear up by itself. Your caregiver may recommend a wrist splint or medicine such as a nonsteroidal anti-inflammatory medicine. Cortisone injections may help. Sometimes, surgery may be needed to free the pinched nerve.  HOME CARE INSTRUCTIONS   Take all medicine as directed by your caregiver. Only take over-the-counter or prescription medicines for pain, discomfort, or fever as directed  by your caregiver.  If you were given a splint to keep your wrist from bending, wear it as directed. It is important to wear the splint at night. Wear the splint for as long as you have pain or numbness in your hand, arm, or wrist. This may take 1 to 2 months.  Rest your wrist from any activity that may be causing your pain. If your symptoms are work-related, you may need to talk to your employer about changing to a job that does not require using your wrist.  Put ice on your wrist after long periods of wrist activity.  Put ice in a plastic bag.  Place a towel between your skin and the bag.  Leave the ice on for 15 to 20 minutes, 3 to 4 times a day.  Keep all follow-up visits as directed by your caregiver. This includes any orthopedic referrals, physical therapy, and rehabilitation. Any delay in getting necessary care could result in a delay or failure of your condition to heal. SEEK IMMEDIATE MEDICAL CARE IF:   You have new, unexplained symptoms.  Your symptoms get worse and are not helped or controlled with medicines. MAKE SURE YOU:   Understand these instructions.  Will watch your condition.  Will get help right away if you are not doing well or get worse. Document Released: 10/09/2000 Document Revised: 01/04/2012 Document Reviewed: 08/28/2011 [facility] Patient Information 2013 [place], [place]. Knee, Cartilage (Meniscus) Injury It is suspected that you have a torn cartilage (meniscus) in your knee. The menisci are made of tough cartilage, and fit between the surfaces of  the thigh and leg bones. The menisci are "C"shaped and have a wedged profile. The wedged profile helps the stability of the joint by keeping the rounded femur surface from sliding off the flat tibial surface. The menisci are fed (nourished) by small blood vessels; but there is also a large area at the inner edge of the meniscus that does not have a good blood supply (avascular). This presents a problem when there is an  injury to the meniscus, because areas without good blood supply heal poorly. As a result when there is a torn cartilage in the knee, surgery is often required to fix it. This is usually done with a surgical procedure less invasive than open surgery (arthroscopy). Some times open surgery of the knee is required if there is other damage. PURPOSE OF THE MENISCUS The medial meniscus rests on the medial tibial plateau. The tibia is the large bone in your lower leg (the shin bone). The medial tibial plateau is the upper end of the bone making up the inner part of your knee. The lateral meniscus serves the same purpose and is located on the outside of the knee. The menisci help to distribute your body weight across the knee joint; they act as shock absorbers. Without the meniscus present, the weight of your body would be unevenly applied to the bones in your legs (the femur and tibia). The femur is the large bone in your thigh. This uneven weight distribution would cause increased wear and tear on the cartilage lining the joint surfaces, leading to early damage (arthritis) of these areas. The presence of the menisci cartilage is necessary for a healthy knee. PURPOSE OF THE KNEE CARTILAGE The knee joint is made up of three bones: the thigh bone (femur), the shin bone (tibia), and the knee cap (patella). The surfaces of these bones at the knee joint are covered with cartilage called articular cartilage. This smooth slippery surface allows the bones to slide against each other without causing bone damage. The meniscus sits between these cartilaginous surfaces of the bones. It distributes the weight evenly in the joints and helps with the stability of the joint (keeps the joint steady). HOME CARE INSTRUCTIONS  Use crutches and external braces as instructed.  Once home an ice pack applied to your injured knee may help with discomfort and keep the swelling down. An ice pack can be used for the first couple of days or as  instructed.  Only take over-the-counter or prescription medicines for pain, discomfort, or fever as directed by your caregiver.  Call if you do not have relief of pain with medications or if there is increasing in pain.  Call if your foot becomes cold or blue.  You may resume normal diet and activities as directed.  Make sure to keep your appointment with your follow-up caregiver. This injury may require further evaluation and treatment beyond the temporary treatment given today. Document Released: 01/02/2003 Document Revised: 01/04/2012 Document Reviewed: 04/26/2009 [facility] Patient Information 2013 [place], [place].

## 2013-01-31 ENCOUNTER — Ambulatory Visit (INDEPENDENT_AMBULATORY_CARE_PROVIDER_SITE_OTHER): Payer: PRIVATE HEALTH INSURANCE | Admitting: Orthopedic Surgery

## 2013-01-31 VITALS — BP 118/62 | Ht 68.0 in | Wt 222.0 lb

## 2013-01-31 DIAGNOSIS — M19049 Primary osteoarthritis, unspecified hand: Secondary | ICD-10-CM

## 2013-01-31 DIAGNOSIS — M23329 Other meniscus derangements, posterior horn of medial meniscus, unspecified knee: Secondary | ICD-10-CM

## 2013-01-31 DIAGNOSIS — G5600 Carpal tunnel syndrome, unspecified upper limb: Secondary | ICD-10-CM

## 2013-01-31 NOTE — Progress Notes (Signed)
Patient ID: CHASE ARNALL, male   DOB: Oct 05, 1951, 62 y.o.   MRN: [id number] No diagnosis found.  Chief Complaint  Patient presents with  . Follow-up    6 week recheck right knee and CTS   Medial meniscus, posterior horn derangement, unspecified laterality  CTS (carpal tunnel syndrome), unspecified laterality  CMC arthritis   The patient reports that his knee is 90% better after injection and his carpal tunnel is 85% better after splinting and gabapentin   He still having pain in the left thumb over the base of the joint  He denies fever chills any new symptoms  Knee range of motion full without effusion  Hands no numbness or tingling carpal tunnel is nontender bilaterally  Thumb tender painful range of motion at the base of the joint  Recommend small joint injection into the [facility] The joint  Procedure note Preprocedure diagnosis CMC joint arthritis left thumb Post procedure same Procedure injection CMC joint Medication 1 cc of Depo-Medrol 40 mg 1 cc of lidocaine 1% Technique sterile prep with alcohol and ethyl chloride for anesthesia Injected CMC joint no complications

## 2013-01-31 NOTE — Patient Instructions (Addendum)
You have received a steroid shot. 15% of patients experience increased pain at the injection site with in the next 24 hours. This is best treated with ice and tylenol extra strength 2 tabs every 8 hours. If you are still having pain please call the office.    

## 2013-02-07 ENCOUNTER — Emergency Department (HOSPITAL_COMMUNITY)
Admission: EM | Admit: 2013-02-07 | Discharge: 2013-02-07 | Disposition: A | Discharge status: Home / Self Care | Payer: PRIVATE HEALTH INSURANCE | Attending: Emergency Medicine | Admitting: Emergency Medicine

## 2013-02-07 ENCOUNTER — Encounter (HOSPITAL_COMMUNITY): Payer: Self-pay | Admitting: *Deleted

## 2013-02-07 DIAGNOSIS — S62639B Displaced fracture of distal phalanx of unspecified finger, initial encounter for open fracture: Secondary | ICD-10-CM | POA: Insufficient documentation

## 2013-02-07 DIAGNOSIS — W312XXA Contact with powered woodworking and forming machines, initial encounter: Secondary | ICD-10-CM | POA: Insufficient documentation

## 2013-02-07 DIAGNOSIS — Y929 Unspecified place or not applicable: Secondary | ICD-10-CM | POA: Insufficient documentation

## 2013-02-07 DIAGNOSIS — Y9389 Activity, other specified: Secondary | ICD-10-CM | POA: Insufficient documentation

## 2013-02-07 HISTORY — DX: Disorder of kidney and ureter, unspecified: N28.9

## 2013-02-07 MED ORDER — OXYCODONE-ACETAMINOPHEN 5-325 MG PO TABS
2.0000 | ORAL_TABLET | Freq: Once | ORAL | Status: AC
  Administered 2013-02-07: 2 via ORAL
  Filled 2013-02-07: qty 2

## 2013-02-07 MED ORDER — OXYCODONE HCL 5 MG PO TABS: ORAL_TABLET | ORAL | Status: DC

## 2013-02-07 MED ORDER — CEPHALEXIN 500 MG PO CAPS: 500.0000 mg | ORAL_CAPSULE | Freq: Four times a day (QID) | ORAL | Status: DC

## 2013-02-07 NOTE — ED Provider Notes (Signed)
MSE was initiated and I personally evaluated the patient and placed orders (if any) at  5:01 PM on February 07, 2013.  The patient appears stable so that the remainder of the MSE may be completed by another provider (Pt sent to meet hand surgeon Dr. Amanda Pea in ED).  Hurman Horn, MD 02/07/13 [phone]

## 2013-02-07 NOTE — Consult Note (Signed)
  Dictation # [id number] See Dictation  Dx: open distal phalanx fracture due to table saw injury Johnny Severin MD

## 2013-02-07 NOTE — Discharge Summary (Signed)
  See full consult note Diagnosis: Status post table saw injury with reconstruction 02/07/2013 We in [place].D.

## 2013-02-07 NOTE — ED Notes (Signed)
Dr. Amanda Pea at bedside with suture cart.

## 2013-02-07 NOTE — ED Notes (Addendum)
Pt comes from [facility] in [place] for laceration to left thumb as result of table saw. Reporting bone involvement. Given tetanus vaccine, 1 g rocephin IM. Coming private vehicle

## 2013-02-07 NOTE — ED Notes (Signed)
Pt sent here from UC in [place] for fracture/lac to L thumb d/t table saw accident.  Pt rcvd IM Rocephin, tetanus and a wound dressing, but no pain meds.

## 2013-02-08 NOTE — Consult Note (Signed)
NAMEBRAD, LIEURANCE               ACCOUNT NO.:  [id number]  MEDICAL RECORD NO.:  [id number]  LOCATION:  B16C                         FACILITY:  [facility]  PHYSICIAN:  Dionne Ano. Kinta Martis, M.D.DATE OF BIRTH:  Jan 18, 1951  DATE OF CONSULTATION: DATE OF DISCHARGE:  02/07/2013                                CONSULTATION   I had the pleasure to see [name].  He was referred from [facility] by Dr. Wende Crease.  He had a Skil saw/table saw type injury to his left thumb with open bony fracture, nail plate, and nail bed injury.  He is here with his wife.  He denies other issues.  He sees Dr. Fuller Canada for his regular orthopedic needs in the [place] area.  He is a retired Environmental manager.  He is a delightful gentleman.  I have reviewed his medicines, records, etc.  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  MEDICINES:  Tramadol, Ambien, Androgel, Crestor, vitamin B6, Prilosec, aspirin, and Elavil as well as Microzide.  ALLERGIES:  The patient has no known drug allergies.  He was given an antibiotic injection.  I have reviewed his x-rays from the urgent care and his chart at length.  He complains of pain in the thumb.  PHYSICAL EXAMINATION:  GENERAL:  He is a male, alert and oriented, in no acute distress. VITAL SIGNS:  Stable. PELVIS:  Stable. ABDOMEN:  Nontender, slightly obese. HEENT:  Within normal limits. CHEST:  Clear. NECK AND BACK:  Nontender. EXTREMITIES:  He has full sensation to the elbow, wrist, and forearm, right upper extremity.  Lower extremity examination is stable.  Left upper extremity has Skil saw injury to the pulp involving the bone.  His nail plate and nail bed are involved as well unfortunately.  X-rays do verify the open fracture.  IMPRESSION:  Left thumb saw injury with open bony fracture as well as nail plate and nail bed injury.  PLAN:  He was consented for I  and D and repair as necessary.  PROCEDURE NOTE:  The patient was seen by myself and taken to the procedure suite, underwent an intermetacarpal block to the thumb and median nerve as well as superficial radial nerve block with lidocaine with epinephrine.  Following this, he was prepped and draped in usual sterile fashion with 3 separate Betadine scrubs.  I was very meticulous in my scrub and sterile technique to make sure that we really got a good I and D.  Following this, he was irrigated with between 2 and 3 L of saline.  I performed irrigation debridement of skin, subcutaneous tissue, bone, periosteal tissue, nail plate, nail bed.  This was an excisional debridement with scissor tip, scalpel, and a curette.  Following this and copious irrigation, the bone was then set.  This was open treatment of an open distal phalanx fracture.  Following this, I performed nail plate removal, followed by a nail bed repair.  I was able to place a little sling flap to get a good coverage over the bone as there was a portion of it missing due to the defect created by  the saw.  He underwent nail plate removal, I and D of an open fracture, treatment of an open distal phalanx fracture, as well as volar advancement flap, and nail bed repair complex in nature under 4.0 loupe magnification.  He tolerated the procedure well.  I did perform a volar advancement flap and repair of a 1 inch laceration (2.5 cm).  Following this, I placed Adaptic under the eponychial fold to prevent nail bed adherence and dressed the thumb with further Adaptic, Xeroform and a thumb spica splint.  He tolerated this well.  There were no complicating features. He had excellent refill at the conclusion of the case.  The tourniquet was insufflated for only a brief portion of time.  I have discussed with him Keflex 500 mg 1 p.o. q.i.d., oxycodone 5 mg 1-2 q.4-6 h. p.r.n. pain p.o., and he will return to see me in 8-10 days for  followup with Therapy seeing him immediately following.  I have discussed him should any problems arise, he will notify me.  Otherwise, we will look forward to seeing him back at that time.  It has been absolute pleasure to see him today.  He understands our plans and all issues, etc.  Should any problems arise, he will notify us.  Once again, this was a full consultation as well as irrigation and debridement, open fracture, treatment of an open fracture, nail plate removal, nail bed repair, and volar advancement flap of the defect with suturing of greater than an inch injury.     Dionne Ano. Amanda Pea, M.D.     [facility]  D:  02/07/2013  T:  02/08/2013  Job:  [id number]

## 2013-04-13 ENCOUNTER — Ambulatory Visit (INDEPENDENT_AMBULATORY_CARE_PROVIDER_SITE_OTHER): Payer: PRIVATE HEALTH INSURANCE | Admitting: Orthopedic Surgery

## 2013-04-13 VITALS — BP 118/70 | Ht 68.0 in | Wt 222.0 lb

## 2013-04-13 DIAGNOSIS — M23329 Other meniscus derangements, posterior horn of medial meniscus, unspecified knee: Secondary | ICD-10-CM

## 2013-04-13 NOTE — Patient Instructions (Addendum)
You have received a steroid shot. 15% of patients experience increased pain at the injection site with in the next 24 hours. This is best treated with ice and tylenol extra strength 2 tabs every 8 hours. If you are still having pain please call the office.  Call if decide on surgery or repeat injection

## 2013-04-14 ENCOUNTER — Encounter: Payer: Self-pay | Admitting: Orthopedic Surgery

## 2013-04-14 NOTE — Progress Notes (Signed)
Patient ID: [name], male   DOB: May 30, 1951, 62 y.o.   MRN: [id number] Chief Complaint  Patient presents with  . Follow-up    Recheck right knee, requesting injection.    Diagnosis medial meniscal tear right knee  Patient received injection in the past did well and has requested another.  Knee  Injection Procedure Note  Pre-operative Diagnosis: right knee oa  Post-operative Diagnosis: same  Indications: pain  Anesthesia: ethyl chloride   Procedure Details   Verbal consent was obtained for the procedure. Time out was completed.The joint was prepped with alcohol, followed by  Ethyl chloride spray and A 20 gauge needle was inserted into the knee via lateral approach; 4ml 1% lidocaine and 1 ml of depomedrol  was then injected into the joint . The needle was removed and the area cleansed and dressed.  Complications:  None; patient tolerated the procedure well.

## 2013-06-21 ENCOUNTER — Telehealth: Payer: Self-pay | Admitting: Orthopedic Surgery

## 2013-06-21 NOTE — Telephone Encounter (Signed)
Called patient and told him to have pharmacy fax a refill request.

## 2013-06-22 ENCOUNTER — Other Ambulatory Visit: Payer: Self-pay | Admitting: *Deleted

## 2013-06-22 DIAGNOSIS — G5600 Carpal tunnel syndrome, unspecified upper limb: Secondary | ICD-10-CM

## 2013-06-22 MED ORDER — GABAPENTIN 100 MG PO CAPS: 100.0000 mg | ORAL_CAPSULE | Freq: Every day | ORAL | Status: DC

## 2013-12-11 ENCOUNTER — Other Ambulatory Visit (HOSPITAL_COMMUNITY): Payer: Self-pay | Admitting: Pulmonary Disease

## 2013-12-11 DIAGNOSIS — R109 Unspecified abdominal pain: Secondary | ICD-10-CM

## 2013-12-14 ENCOUNTER — Ambulatory Visit (HOSPITAL_COMMUNITY)
Admission: RE | Admit: 2013-12-14 | Discharge: 2013-12-14 | Disposition: A | Discharge status: Home / Self Care | Payer: PRIVATE HEALTH INSURANCE | Source: Ambulatory Visit | Attending: Pulmonary Disease | Admitting: Pulmonary Disease

## 2013-12-14 DIAGNOSIS — K8020 Calculus of gallbladder without cholecystitis without obstruction: Secondary | ICD-10-CM | POA: Insufficient documentation

## 2013-12-14 DIAGNOSIS — R109 Unspecified abdominal pain: Secondary | ICD-10-CM | POA: Insufficient documentation

## 2013-12-14 DIAGNOSIS — N269 Renal sclerosis, unspecified: Secondary | ICD-10-CM | POA: Insufficient documentation

## 2013-12-14 DIAGNOSIS — N135 Crossing vessel and stricture of ureter without hydronephrosis: Secondary | ICD-10-CM | POA: Insufficient documentation

## 2013-12-14 DIAGNOSIS — E785 Hyperlipidemia, unspecified: Secondary | ICD-10-CM | POA: Insufficient documentation

## 2013-12-14 MED ORDER — IOHEXOL 300 MG/ML  SOLN
80.0000 mL | Freq: Once | INTRAMUSCULAR | Status: AC | PRN
  Administered 2013-12-14: 80 mL via INTRAVENOUS

## 2014-01-23 ENCOUNTER — Ambulatory Visit: Payer: PRIVATE HEALTH INSURANCE | Admitting: Orthopedic Surgery

## 2014-03-14 ENCOUNTER — Other Ambulatory Visit: Payer: Self-pay | Admitting: Dermatology

## 2014-05-01 ENCOUNTER — Ambulatory Visit (INDEPENDENT_AMBULATORY_CARE_PROVIDER_SITE_OTHER): Payer: PRIVATE HEALTH INSURANCE | Admitting: Orthopedic Surgery

## 2014-05-01 VITALS — BP 142/86 | Ht 68.0 in | Wt 222.0 lb

## 2014-05-01 DIAGNOSIS — IMO0002 Reserved for concepts with insufficient information to code with codable children: Secondary | ICD-10-CM

## 2014-05-01 DIAGNOSIS — S83207A Unspecified tear of unspecified meniscus, current injury, left knee, initial encounter: Secondary | ICD-10-CM

## 2014-05-01 MED ORDER — HYDROCODONE-ACETAMINOPHEN 5-325 MG PO TABS: 1.0000 | ORAL_TABLET | Freq: Four times a day (QID) | ORAL | Status: DC | PRN

## 2014-05-01 NOTE — Progress Notes (Signed)
Patient ID: [name], male   DOB: Jul 13, 1951, 63 y.o.   MRN: [id number]  Chief complaint left knee pain x10 weeks  Acute onset left knee pain 10 weeks ago. Patient presents as a 63 year old male who with pain stiffness and giving way symptoms of his left knee. He complains that the knee is on fire with aching pain radiating across the front of the knee medial joint line which has become constant. He says is controlling his life he did get some mild relief with ice. He did not get good relief with prednisone. He says it hurts more sitting and walking.  Current medications Elavil Crestor Ambien tramadol AndroGel hydrochlorothiazide omeprazole multivitamin  No known drug allergies  System review ringing in the ears heartburn joint pain and limb pain swelling of the left knee  Visual disturbance numbness and tingling recent hematuria currently scheduled to have MRI for further workup as he has 1 functioning kidney  Vital signs: BP 142/86  Ht 5\' 8"  (1.727 m)  Wt 222 lb (100.699 kg)  BMI 33.76 kg/m2   General the patient is well-developed and well-nourished grooming and hygiene are normal Oriented x3 Mood and affect normal Ambulation he has a left leg limping gait with pain radiating up into his hip secondary to pain in his knee  medial joint line is tender  passive range of motion is normal Provocative test torn medial meniscus indicated by positive McMurray sign and positive screw home test.   All ligaments tested and were stable muscle tone and strength normal skin clean dry and intact no rash puncture  Cardiovascular exam is normal Sensory exam normal  Right knee no swelling full range of motion strength stability and alignment  Urgent care x-ray reviewed no arthritic changes no fracture dislocation or swelling  Diagnosis torn medial meniscus. The patient is anxious to have surgery if needed and I think it will be needed. MRI to plan surgical treatment and give appropriate  preoperative counseling.  MRI left knee  You have received a steroid shot. 15% of patients experience increased pain at the injection site with in the next 24 hours. This is best treated with ice and tylenol extra strength 2 tabs every 8 hours. If you are still having pain please call the office.   Meds ordered this encounter  Medications  . HYDROcodone-acetaminophen (NORCO/VICODIN) 5-325 MG per tablet    Sig: Take 1 tablet by mouth every 6 (six) hours as needed for moderate pain.    Dispense:  30 tablet    Refill:  0    Orders Placed This Encounter  Procedures  . MR Knee Left  Wo Contrast    Standing Status: Future     Number of Occurrences:      Standing Expiration Date: 07/03/2015    Order Specific Question:  Reason for Exam (SYMPTOM  OR DIAGNOSIS REQUIRED)    Answer:  left meniscal tear    Order Specific Question:  Preferred imaging location?    Answer:  [facility] [address]    Order Specific Question:  Does the patient have a pacemaker or implanted devices?    Answer:  No    Order Specific Question:  What is the patient's sedation requirement?    Answer:  No Sedation

## 2014-05-01 NOTE — Patient Instructions (Signed)
We will schedule MRI and call you with the appointment

## 2014-05-03 ENCOUNTER — Other Ambulatory Visit (HOSPITAL_COMMUNITY): Payer: Self-pay | Admitting: Urology

## 2014-05-03 ENCOUNTER — Telehealth: Payer: Self-pay | Admitting: *Deleted

## 2014-05-03 DIAGNOSIS — N189 Chronic kidney disease, unspecified: Secondary | ICD-10-CM

## 2014-05-03 DIAGNOSIS — R9389 Abnormal findings on diagnostic imaging of other specified body structures: Secondary | ICD-10-CM

## 2014-05-03 NOTE — Telephone Encounter (Signed)
No pre-cert required for MRI lower joint w/out contrast CPT 73721 per Sandrea Hammond Reference # [id number] Scheduled for 05-07-14 at 2:00

## 2014-05-07 ENCOUNTER — Ambulatory Visit (HOSPITAL_COMMUNITY)
Admission: RE | Admit: 2014-05-07 | Discharge: 2014-05-07 | Disposition: A | Discharge status: Home / Self Care | Payer: PRIVATE HEALTH INSURANCE | Source: Ambulatory Visit | Attending: Orthopedic Surgery | Admitting: Orthopedic Surgery

## 2014-05-07 DIAGNOSIS — M6740 Ganglion, unspecified site: Secondary | ICD-10-CM | POA: Insufficient documentation

## 2014-05-07 DIAGNOSIS — M7120 Synovial cyst of popliteal space [Baker], unspecified knee: Secondary | ICD-10-CM | POA: Insufficient documentation

## 2014-05-07 DIAGNOSIS — S83207A Unspecified tear of unspecified meniscus, current injury, left knee, initial encounter: Secondary | ICD-10-CM

## 2014-05-10 ENCOUNTER — Ambulatory Visit (HOSPITAL_COMMUNITY)
Admission: RE | Admit: 2014-05-10 | Discharge: 2014-05-10 | Disposition: A | Discharge status: Home / Self Care | Payer: PRIVATE HEALTH INSURANCE | Source: Ambulatory Visit | Attending: Urology | Admitting: Urology

## 2014-05-10 DIAGNOSIS — R319 Hematuria, unspecified: Secondary | ICD-10-CM | POA: Insufficient documentation

## 2014-05-10 DIAGNOSIS — K8020 Calculus of gallbladder without cholecystitis without obstruction: Secondary | ICD-10-CM | POA: Insufficient documentation

## 2014-05-10 DIAGNOSIS — N189 Chronic kidney disease, unspecified: Secondary | ICD-10-CM

## 2014-05-10 DIAGNOSIS — R9389 Abnormal findings on diagnostic imaging of other specified body structures: Secondary | ICD-10-CM | POA: Insufficient documentation

## 2014-05-14 ENCOUNTER — Ambulatory Visit (INDEPENDENT_AMBULATORY_CARE_PROVIDER_SITE_OTHER): Payer: PRIVATE HEALTH INSURANCE | Admitting: Orthopedic Surgery

## 2014-05-14 VITALS — BP 127/81 | Ht 68.0 in | Wt 222.0 lb

## 2014-05-14 DIAGNOSIS — IMO0002 Reserved for concepts with insufficient information to code with codable children: Secondary | ICD-10-CM

## 2014-05-14 DIAGNOSIS — S83207A Unspecified tear of unspecified meniscus, current injury, left knee, initial encounter: Secondary | ICD-10-CM

## 2014-05-14 MED ORDER — HYDROCODONE-ACETAMINOPHEN 5-325 MG PO TABS: 1.0000 | ORAL_TABLET | Freq: Four times a day (QID) | ORAL | Status: DC | PRN

## 2014-05-14 MED ORDER — GABAPENTIN 100 MG PO CAPS: 100.0000 mg | ORAL_CAPSULE | Freq: Three times a day (TID) | ORAL | Status: DC

## 2014-05-14 NOTE — Patient Instructions (Signed)
Start new medication Gabapentin 100mg  three times a day, if no relief go up to 200mg  three times a day, max 300mg  three times a day Continue Hydrocodone

## 2014-05-14 NOTE — Progress Notes (Signed)
Patient ID: [name], male   DOB: 02/07/1951, 63 y.o.   MRN: [id number] Chief complaint left knee pain x10 weeks  Acute onset left knee pain 10 weeks ago. Patient presents as a 63 year old male who with pain stiffness and giving way symptoms of his left knee. He complains that the knee is on fire with aching pain radiating across the front of the knee medial joint line which has become constant. He says is controlling his life he did get some mild relief with ice. He did not get good relief with prednisone. He says it hurts more sitting than walking.  MRI followup  His MRI shows fraying of his medial meniscus normal lateral meniscus mild arthritis. He still has lateral pain and he hurts when he sitting with his legs dangling more than when he is walking. He says he can stand and shuffle day without pain. I advised him that this is unusual in his symptoms don't match his MRI. He does not have back pain. He seems to have a burning pain in the joint he describes as being deep in the joint but not like the pain he had in his right knee which did have meniscal tear.  I discussed this with him at length I reexamined his knee with the following findings  Mild medial joint line tenderness negative McMurray's full range of motion. Normal strength normal straight leg raise. Ligament stable. Normal straight leg raise opposite side negative opposite leg strength leg raise test. Normal neurovascular exam. No lateral tenderness in the joint. No joint effusion.  Unclear cause of left knee pain. He is going away in September on the trip and would like to have this resolved before then however, explain to him that we were refer further nonoperative treatment since we don't have a definitive lesion on MRI and that is his knee symptoms. He understands that we may have to scope the knee if we can't figure things out any other way   Meds ordered this encounter  Medications  . gabapentin (NEURONTIN) 100 MG capsule     Sig: Take 1 capsule (100 mg total) by mouth 3 (three) times daily.    Dispense:  90 capsule    Refill:  2  . HYDROcodone-acetaminophen (NORCO/VICODIN) 5-325 MG per tablet    Sig: Take 1 tablet by mouth every 6 (six) hours as needed for moderate pain.    Dispense:  60 tablet    Refill:  0

## 2014-05-21 ENCOUNTER — Other Ambulatory Visit (HOSPITAL_COMMUNITY): Payer: Self-pay | Admitting: Pulmonary Disease

## 2014-05-21 ENCOUNTER — Telehealth: Payer: Self-pay | Admitting: Orthopedic Surgery

## 2014-05-21 DIAGNOSIS — M545 Low back pain: Secondary | ICD-10-CM

## 2014-05-21 NOTE — Telephone Encounter (Signed)
Patient called to inquire about (1) his Gabapentin 100mg , which was prescribed at his visit 05/14/14, at which time his MRI results of knee were discussed -- he states the medicine is not helping; Therefore, (2) he is asking about a possible referral to neurosurgeon, and/or an MRI of his back, rather than waiting till his next scheduled appointment on 06/18/14 to decide on what next treatment is to be.  His ph# is [phone]

## 2014-05-21 NOTE — Telephone Encounter (Signed)
PATIENT WENT TO PCP TODAY AND MRI WAS SCHEDULED

## 2014-05-21 NOTE — Telephone Encounter (Signed)
Routing to Dr Harrison 

## 2014-05-21 NOTE — Telephone Encounter (Signed)
He will have to come in per his insurance before back mri can be done

## 2014-05-22 ENCOUNTER — Ambulatory Visit (HOSPITAL_COMMUNITY)
Admission: RE | Admit: 2014-05-22 | Discharge: 2014-05-22 | Disposition: A | Discharge status: Home / Self Care | Payer: PRIVATE HEALTH INSURANCE | Source: Ambulatory Visit | Attending: Pulmonary Disease | Admitting: Pulmonary Disease

## 2014-05-22 ENCOUNTER — Ambulatory Visit (INDEPENDENT_AMBULATORY_CARE_PROVIDER_SITE_OTHER): Payer: PRIVATE HEALTH INSURANCE | Admitting: Urology

## 2014-05-22 DIAGNOSIS — N269 Renal sclerosis, unspecified: Secondary | ICD-10-CM

## 2014-05-22 DIAGNOSIS — M48061 Spinal stenosis, lumbar region without neurogenic claudication: Secondary | ICD-10-CM | POA: Insufficient documentation

## 2014-05-22 DIAGNOSIS — M545 Low back pain, unspecified: Secondary | ICD-10-CM | POA: Insufficient documentation

## 2014-05-22 DIAGNOSIS — R310 Gross hematuria: Secondary | ICD-10-CM

## 2014-05-22 NOTE — Telephone Encounter (Signed)
05/22/14 - Received notes from primary care provider, Dr. Sinda Du, from office visit 05/21/14, as noted.  Placed in Dr Ruthe Mannan box for review; copied for patient's scheduled follow up appointment here in August.

## 2014-05-24 ENCOUNTER — Other Ambulatory Visit: Payer: Self-pay | Admitting: Urology

## 2014-05-24 ENCOUNTER — Telehealth: Payer: Self-pay | Admitting: Orthopedic Surgery

## 2014-05-24 NOTE — Telephone Encounter (Signed)
Spoke with patient and clarified the order for gabapentin as Dr. Aline Brochure stated in patients's instructions from office visit 05/14/14.

## 2014-05-24 NOTE — Telephone Encounter (Signed)
Patient has called back regarding Gabapentin; asking for clarification on dosage -- is it 3 pills 3x a day?  Please advise.  Ph# is [phone]

## 2014-05-28 ENCOUNTER — Ambulatory Visit: Payer: PRIVATE HEALTH INSURANCE | Admitting: Orthopedic Surgery

## 2014-05-28 NOTE — Telephone Encounter (Signed)
Routing to dr harrison 

## 2014-05-28 NOTE — Telephone Encounter (Signed)
CALLED PATIENT TO RELAY DR HARRISON'S RECOMMENDATION, PATIENT UNAVAILABLE, WIFE STATES HE WILL RETURN CALL TOMORROW

## 2014-05-28 NOTE — Telephone Encounter (Signed)
i looked at his mri   i would suggest esi   We can schedule for him   [id number]

## 2014-05-28 NOTE — Telephone Encounter (Signed)
Patient has called back again, and still relays hurting-radiating knee pain - also has already had MRI of his back as noted, ordered by primary care, Dr Luan Pulling.  Patient states "medication doesn't seem to be helping - pain medication, Hydrocodone along with Gabapentin.  Patient's next scheduled appointment here is 06/18/14 (which was scheduled based on last office visit here) Does Dr Aline Brochure need to confer with Dr Luan Pulling?  Please advise patient.

## 2014-05-29 ENCOUNTER — Other Ambulatory Visit: Payer: Self-pay | Admitting: *Deleted

## 2014-05-29 ENCOUNTER — Telehealth: Payer: Self-pay | Admitting: Orthopedic Surgery

## 2014-05-29 DIAGNOSIS — M4807 Spinal stenosis, lumbosacral region: Secondary | ICD-10-CM

## 2014-05-29 NOTE — Telephone Encounter (Signed)
Patricia Pesa asked if you can prescribe a different pain medication.  Said the Hydrocodone is just not working. Just wants something while he is waiting on the appointment for the injections

## 2014-05-29 NOTE — Telephone Encounter (Signed)
Routing to dr harrison 

## 2014-05-29 NOTE — Telephone Encounter (Signed)
Patient in agreement and order sent to [facility] for ESI injections

## 2014-05-31 NOTE — Telephone Encounter (Signed)
No we can not  

## 2014-05-31 NOTE — Telephone Encounter (Signed)
Patient aware.

## 2014-06-04 ENCOUNTER — Telehealth: Payer: Self-pay | Admitting: Orthopedic Surgery

## 2014-06-04 ENCOUNTER — Encounter (HOSPITAL_BASED_OUTPATIENT_CLINIC_OR_DEPARTMENT_OTHER): Payer: Self-pay | Admitting: *Deleted

## 2014-06-04 NOTE — Telephone Encounter (Signed)
[name] has not heard anything about the appointment for the [facility].  Asked for a call to tell him where he was referred So he can call and try to expedite the appointment

## 2014-06-04 NOTE — Progress Notes (Signed)
Instructed to [facility] at 0800-Istat ,Ekg on arrival-Npo after Mn- to take omeprazole, pain medication as needed with small amt water that am.

## 2014-06-04 NOTE — Telephone Encounter (Signed)
Patient advised [facility]

## 2014-06-05 ENCOUNTER — Ambulatory Visit
Admission: RE | Admit: 2014-06-05 | Discharge: 2014-06-05 | Disposition: A | Discharge status: Home / Self Care | Payer: PRIVATE HEALTH INSURANCE | Source: Ambulatory Visit | Attending: Orthopedic Surgery | Admitting: Orthopedic Surgery

## 2014-06-05 DIAGNOSIS — M4807 Spinal stenosis, lumbosacral region: Secondary | ICD-10-CM

## 2014-06-05 MED ORDER — METHYLPREDNISOLONE ACETATE 40 MG/ML INJ SUSP (RADIOLOG
120.0000 mg | Freq: Once | INTRAMUSCULAR | Status: AC
  Administered 2014-06-05: 120 mg via EPIDURAL

## 2014-06-05 MED ORDER — IOHEXOL 180 MG/ML  SOLN
1.0000 mL | Freq: Once | INTRAMUSCULAR | Status: AC | PRN
  Administered 2014-06-05: 1 mL via EPIDURAL

## 2014-06-05 NOTE — Discharge Instructions (Signed)

## 2014-06-07 ENCOUNTER — Ambulatory Visit (HOSPITAL_BASED_OUTPATIENT_CLINIC_OR_DEPARTMENT_OTHER)
Admission: RE | Admit: 2014-06-07 | Discharge: 2014-06-07 | Disposition: A | Discharge status: Home / Self Care | Payer: PRIVATE HEALTH INSURANCE | Source: Ambulatory Visit | Attending: Urology | Admitting: Urology

## 2014-06-07 ENCOUNTER — Ambulatory Visit (HOSPITAL_BASED_OUTPATIENT_CLINIC_OR_DEPARTMENT_OTHER): Payer: PRIVATE HEALTH INSURANCE | Admitting: Anesthesiology

## 2014-06-07 ENCOUNTER — Encounter (HOSPITAL_BASED_OUTPATIENT_CLINIC_OR_DEPARTMENT_OTHER): Payer: Self-pay | Admitting: *Deleted

## 2014-06-07 ENCOUNTER — Encounter (HOSPITAL_BASED_OUTPATIENT_CLINIC_OR_DEPARTMENT_OTHER): Payer: PRIVATE HEALTH INSURANCE | Admitting: Anesthesiology

## 2014-06-07 ENCOUNTER — Ambulatory Visit: Payer: Self-pay | Admitting: Urology

## 2014-06-07 ENCOUNTER — Encounter (HOSPITAL_BASED_OUTPATIENT_CLINIC_OR_DEPARTMENT_OTHER)
Admission: RE | Disposition: A | Discharge status: Home / Self Care | Payer: Self-pay | Source: Ambulatory Visit | Attending: Urology

## 2014-06-07 DIAGNOSIS — K219 Gastro-esophageal reflux disease without esophagitis: Secondary | ICD-10-CM | POA: Diagnosis not present

## 2014-06-07 DIAGNOSIS — N1330 Unspecified hydronephrosis: Secondary | ICD-10-CM | POA: Diagnosis present

## 2014-06-07 DIAGNOSIS — Z79899 Other long term (current) drug therapy: Secondary | ICD-10-CM | POA: Diagnosis not present

## 2014-06-07 DIAGNOSIS — G4730 Sleep apnea, unspecified: Secondary | ICD-10-CM | POA: Insufficient documentation

## 2014-06-07 DIAGNOSIS — M4800 Spinal stenosis, site unspecified: Secondary | ICD-10-CM | POA: Insufficient documentation

## 2014-06-07 DIAGNOSIS — E785 Hyperlipidemia, unspecified: Secondary | ICD-10-CM | POA: Diagnosis not present

## 2014-06-07 DIAGNOSIS — Z7982 Long term (current) use of aspirin: Secondary | ICD-10-CM | POA: Diagnosis not present

## 2014-06-07 DIAGNOSIS — N135 Crossing vessel and stricture of ureter without hydronephrosis: Secondary | ICD-10-CM | POA: Insufficient documentation

## 2014-06-07 DIAGNOSIS — R319 Hematuria, unspecified: Secondary | ICD-10-CM

## 2014-06-07 DIAGNOSIS — N269 Renal sclerosis, unspecified: Secondary | ICD-10-CM | POA: Insufficient documentation

## 2014-06-07 DIAGNOSIS — R310 Gross hematuria: Secondary | ICD-10-CM | POA: Insufficient documentation

## 2014-06-07 DIAGNOSIS — I10 Essential (primary) hypertension: Secondary | ICD-10-CM | POA: Diagnosis not present

## 2014-06-07 HISTORY — PX: CYSTOSCOPY/RETROGRADE/URETEROSCOPY: SHX5316

## 2014-06-07 HISTORY — DX: Essential (primary) hypertension: I10

## 2014-06-07 LAB — POCT I-STAT 4, (NA,K, GLUC, HGB,HCT)
Glucose, Bld: 107 mg/dL — ABNORMAL HIGH (ref 70–99)
HCT: 38 % — ABNORMAL LOW (ref 39.0–52.0)
HEMOGLOBIN: 12.9 g/dL — AB (ref 13.0–17.0)
POTASSIUM: 3.7 meq/L (ref 3.7–5.3)
Sodium: 142 mEq/L (ref 137–147)

## 2014-06-07 SURGERY — CYSTOSCOPY/RETROGRADE/URETEROSCOPY
Anesthesia: General | Site: Ureter | Laterality: Right

## 2014-06-07 MED ORDER — ACETAMINOPHEN 325 MG PO TABS
650.0000 mg | ORAL_TABLET | ORAL | Status: DC | PRN
  Filled 2014-06-07: qty 2

## 2014-06-07 MED ORDER — OXYBUTYNIN CHLORIDE 5 MG PO TABS: 5.0000 mg | ORAL_TABLET | Freq: Three times a day (TID) | ORAL | Status: DC

## 2014-06-07 MED ORDER — SODIUM CHLORIDE 0.9 % IR SOLN
Status: DC | PRN
  Administered 2014-06-07: 4500 mL

## 2014-06-07 MED ORDER — ONDANSETRON HCL 4 MG/2ML IJ SOLN
INTRAMUSCULAR | Status: DC | PRN
  Administered 2014-06-07: 4 mg via INTRAVENOUS

## 2014-06-07 MED ORDER — OXYCODONE HCL 5 MG PO TABS
5.0000 mg | ORAL_TABLET | ORAL | Status: DC | PRN
  Filled 2014-06-07: qty 2

## 2014-06-07 MED ORDER — ACETAMINOPHEN 10 MG/ML IV SOLN
INTRAVENOUS | Status: DC | PRN
  Administered 2014-06-07: 1000 mg via INTRAVENOUS

## 2014-06-07 MED ORDER — STERILE WATER FOR IRRIGATION IR SOLN
Status: DC | PRN
  Administered 2014-06-07: 500 mL

## 2014-06-07 MED ORDER — FENTANYL CITRATE 0.05 MG/ML IJ SOLN
25.0000 ug | INTRAMUSCULAR | Status: DC | PRN
  Filled 2014-06-07: qty 1

## 2014-06-07 MED ORDER — DEXAMETHASONE SODIUM PHOSPHATE 4 MG/ML IJ SOLN
INTRAMUSCULAR | Status: DC | PRN
  Administered 2014-06-07: 4 mg via INTRAVENOUS

## 2014-06-07 MED ORDER — KETOROLAC TROMETHAMINE 30 MG/ML IJ SOLN
INTRAMUSCULAR | Status: DC | PRN
  Administered 2014-06-07: 30 mg via INTRAVENOUS

## 2014-06-07 MED ORDER — SODIUM CHLORIDE 0.9 % IV SOLN
250.0000 mL | INTRAVENOUS | Status: DC | PRN
  Filled 2014-06-07: qty 250

## 2014-06-07 MED ORDER — LACTATED RINGERS IV SOLN
INTRAVENOUS | Status: DC | PRN
  Administered 2014-06-07: 09:00:00 via INTRAVENOUS

## 2014-06-07 MED ORDER — PROPOFOL 10 MG/ML IV BOLUS
INTRAVENOUS | Status: DC | PRN
  Administered 2014-06-07: 250 mg via INTRAVENOUS

## 2014-06-07 MED ORDER — ACETAMINOPHEN 650 MG RE SUPP
650.0000 mg | RECTAL | Status: DC | PRN
  Filled 2014-06-07: qty 1

## 2014-06-07 MED ORDER — FENTANYL CITRATE 0.05 MG/ML IJ SOLN
INTRAMUSCULAR | Status: AC
  Filled 2014-06-07: qty 4

## 2014-06-07 MED ORDER — CIPROFLOXACIN HCL 250 MG PO TABS: 250.0000 mg | ORAL_TABLET | Freq: Two times a day (BID) | ORAL | Status: DC

## 2014-06-07 MED ORDER — LIDOCAINE HCL (CARDIAC) 20 MG/ML IV SOLN
INTRAVENOUS | Status: DC | PRN
  Administered 2014-06-07: 100 mg via INTRAVENOUS

## 2014-06-07 MED ORDER — FENTANYL CITRATE 0.05 MG/ML IJ SOLN
INTRAMUSCULAR | Status: DC | PRN
  Administered 2014-06-07: 25 ug via INTRAVENOUS
  Administered 2014-06-07: 50 ug via INTRAVENOUS
  Administered 2014-06-07 (×5): 25 ug via INTRAVENOUS

## 2014-06-07 MED ORDER — SODIUM CHLORIDE 0.9 % IJ SOLN
3.0000 mL | INTRAMUSCULAR | Status: DC | PRN
Start: 2014-06-07 — End: 2014-06-07
  Filled 2014-06-07: qty 3

## 2014-06-07 MED ORDER — IOHEXOL 350 MG/ML SOLN
INTRAVENOUS | Status: DC | PRN
Start: 2014-06-07 — End: 2014-06-07
  Administered 2014-06-07: 13 mL

## 2014-06-07 MED ORDER — MIDAZOLAM HCL 2 MG/2ML IJ SOLN
INTRAMUSCULAR | Status: AC
  Filled 2014-06-07: qty 2

## 2014-06-07 MED ORDER — MORPHINE SULFATE 2 MG/ML IJ SOLN
2.0000 mg | INTRAMUSCULAR | Status: DC | PRN
  Filled 2014-06-07: qty 1

## 2014-06-07 MED ORDER — SODIUM CHLORIDE 0.9 % IJ SOLN
3.0000 mL | Freq: Two times a day (BID) | INTRAMUSCULAR | Status: DC
  Filled 2014-06-07: qty 3

## 2014-06-07 MED ORDER — LACTATED RINGERS IV SOLN
INTRAVENOUS | Status: DC
  Administered 2014-06-07: 09:00:00 via INTRAVENOUS
  Filled 2014-06-07: qty 1000

## 2014-06-07 MED ORDER — MIDAZOLAM HCL 5 MG/5ML IJ SOLN
INTRAMUSCULAR | Status: DC | PRN
  Administered 2014-06-07 (×2): 1 mg via INTRAVENOUS

## 2014-06-07 MED ORDER — CEFAZOLIN SODIUM 1-5 GM-% IV SOLN
1.0000 g | INTRAVENOUS | Status: DC
  Filled 2014-06-07: qty 50

## 2014-06-07 MED ORDER — CEFAZOLIN SODIUM-DEXTROSE 2-3 GM-% IV SOLR
2.0000 g | INTRAVENOUS | Status: AC
  Administered 2014-06-07: 2 g via INTRAVENOUS
  Filled 2014-06-07: qty 50

## 2014-06-07 MED ORDER — LACTATED RINGERS IV SOLN
INTRAVENOUS | Status: DC
  Filled 2014-06-07: qty 1000

## 2014-06-07 SURGICAL SUPPLY — 27 items
BAG DRAIN URO-CYSTO SKYTR STRL (DRAIN) ×3 IMPLANT
BAG DRN UROCATH (DRAIN) ×1
CANISTER SUCT LVC 12 LTR MEDI- (MISCELLANEOUS) ×2 IMPLANT
CATH INTERMIT  6FR 70CM (CATHETERS) ×2 IMPLANT
CLOTH BEACON ORANGE TIMEOUT ST (SAFETY) ×3 IMPLANT
CONT SPECI 4OZ STER CLIK (MISCELLANEOUS) ×2 IMPLANT
DRAPE CAMERA CLOSED 9X96 (DRAPES) ×3 IMPLANT
ELECT REM PT RETURN 9FT ADLT (ELECTROSURGICAL) ×3
ELECTRODE REM PT RTRN 9FT ADLT (ELECTROSURGICAL) IMPLANT
FORCEPS BIOP 2.4F 115CM BACKLD (INSTRUMENTS) ×2 IMPLANT
GLOVE BIO SURGEON STRL SZ 6.5 (GLOVE) ×1 IMPLANT
GLOVE BIO SURGEON STRL SZ8 (GLOVE) ×3 IMPLANT
GLOVE BIO SURGEONS STRL SZ 6.5 (GLOVE) ×1
GLOVE INDICATOR 6.5 STRL GRN (GLOVE) ×4 IMPLANT
GOWN STRL REUS W/ TWL LRG LVL3 (GOWN DISPOSABLE) IMPLANT
GOWN STRL REUS W/TWL LRG LVL3 (GOWN DISPOSABLE) ×5 IMPLANT
GUIDEWIRE STR DUAL SENSOR (WIRE) ×2 IMPLANT
IV NS 1000ML (IV SOLUTION) ×3
IV NS 1000ML BAXH (IV SOLUTION) IMPLANT
IV NS IRRIG 3000ML ARTHROMATIC (IV SOLUTION) ×4 IMPLANT
KIT BALLIN UROMAX 15FX10 (LABEL) IMPLANT
NS IRRIG 500ML POUR BTL (IV SOLUTION) ×2 IMPLANT
PACK CYSTOSCOPY (CUSTOM PROCEDURE TRAY) ×3 IMPLANT
SET HIGH PRES BAL DIL (LABEL) ×2
SHEATH ACCESS URETERAL 54CM (SHEATH) ×2 IMPLANT
STENT URET 6FRX24 CONTOUR (STENTS) ×2 IMPLANT
WATER STERILE IRR 500ML POUR (IV SOLUTION) ×2 IMPLANT

## 2014-06-07 NOTE — Anesthesia Procedure Notes (Signed)
Procedure Name: LMA Insertion Date/Time: 06/07/2014 9:39 AM Performed by: Justice Rocher Pre-anesthesia Checklist: Patient identified, Emergency Drugs available, Suction available and Patient being monitored Patient Re-evaluated:Patient Re-evaluated prior to inductionOxygen Delivery Method: Circle System Utilized Preoxygenation: Pre-oxygenation with 100% oxygen Intubation Type: IV induction Ventilation: Mask ventilation without difficulty LMA: LMA inserted LMA Size: 5.0 Number of attempts: 1 Airway Equipment and Method: bite block Placement Confirmation: positive ETCO2 Tube secured with: Tape Dental Injury: Teeth and Oropharynx as per pre-operative assessment

## 2014-06-07 NOTE — Anesthesia Postprocedure Evaluation (Signed)
  Anesthesia Post-op Note  Patient: [name]  Procedure(s) Performed: Procedure(s) (LRB): CYSTOSCOPY, RIGHT RETROGRADE, RIGHT URETEROSCOPY, RIGHT URETERAL BALLOON DILATATION, BIOPSY, RIGHT URETERAL STENT (Right)  Patient Location: PACU  Anesthesia Type: General  Level of Consciousness: awake and alert   Airway and Oxygen Therapy: Patient Spontanous Breathing  Post-op Pain: mild  Post-op Assessment: Post-op Vital signs reviewed, Patient's Cardiovascular Status Stable, Respiratory Function Stable, Patent Airway and No signs of Nausea or vomiting  Last Vitals:  Filed Vitals:   06/07/14 1045  BP: 144/83  Pulse: 91  Temp:   Resp: 12    Post-op Vital Signs: stable   Complications: No apparent anesthesia complications

## 2014-06-07 NOTE — H&P (Signed)
Urology History and Physical Exam  CC: blood in urine  HPI: 63 year old male presents at this time for cystoscopy, right retrograde ureteropyelogram and diagnostic right uretero pyeloscopy.his history is as follows:   He has a history of an open ureterolithotomy years ago by Dr. Terance Hart.  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  He was asymptomatic from that, but he did have renal insufficiency.  He has been followed for that by a nephrologist.  Recently, he was found to have gross hematuria which was otherwise asymptomatic.  CT stone protocol was performed revealing right renal atrophy, significant hydronephrosis and a possible clot versus renal pelvic mass.  He had a follow-up MRI which showed a probable clot in his renal pelvis, but tumor could not be ruled out.  He is no longer having hematuria.  He is having no flank pain.  Recent creatinine was 2.10.   PMH: Past Medical History  Diagnosis Date  . Hyperlipidemia   . Sleep apnea     uses Cpap  . GERD (gastroesophageal reflux disease)   . Kidney function abnormal   . 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 Hypertension   . Arthritis     spinal stenosis    PSH: Past Surgical History  Procedure Laterality Date  . Kidney stone  1983  . Left foot surgery  2010  . Colonoscopy  05/25/2012    Procedure: COLONOSCOPY;  Surgeon: Rogene Houston, MD;  Location: [facility];  Service: Endoscopy;  Laterality: N/A;  1030  . Cataract extraction      10-15 years ago    Allergies: No Known Allergies  Medications: Prescriptions prior to admission  Medication Sig Dispense Refill  . amitriptyline (ELAVIL) 25 MG tablet Take 25 mg by mouth at bedtime.      Marland Kitchen aspirin EC 81 MG tablet Take 81 mg by mouth at bedtime.       . gabapentin (NEURONTIN) 100 MG capsule Take 1 capsule (100 mg total) by mouth at bedtime.  60 capsule  5  . HYDROcodone-acetaminophen (NORCO/VICODIN)  5-325 MG per tablet Take 1 tablet by mouth every 6 (six) hours as needed for moderate pain.  60 tablet  0  . Multiple Vitamins-Minerals (MULTIVITAMIN WITH MINERALS) tablet Take 1 tablet by mouth daily.      Marland Kitchen omeprazole (PRILOSEC) 20 MG capsule Take 20 mg by mouth 2 (two) times daily.      Marland Kitchen pyridOXINE (VITAMIN B-6) 100 MG tablet Take 1 tablet (100 mg total) by mouth 2 (two) times daily.  60 tablet  1  . rosuvastatin (CRESTOR) 10 MG tablet Take 10 mg by mouth at bedtime.      . Testosterone (ANDROGEL PUMP) 1.25 GM/ACT (1%) GEL Place 1 application onto the skin every evening.       . traMADol (ULTRAM) 50 MG tablet Take 50 mg by mouth 3 (three) times daily.      Marland Kitchen zolpidem (AMBIEN) 10 MG tablet Take 10 mg by mouth at bedtime.      . cephALEXin (KEFLEX) 500 MG capsule Take 1 capsule (500 mg total) by mouth 4 (four) times daily.  40 capsule  0  . gabapentin (NEURONTIN) 100 MG capsule Take 1 capsule (100 mg total) by mouth 3 (three) times daily.  90 capsule  2  . hydrochlorothiazide (MICROZIDE) 12.5 MG capsule Take 12.5 mg by mouth daily.      Marland Kitchen  oxyCODONE (OXY IR/ROXICODONE) 5 MG immediate release tablet Take 1-2 tablets as needed for pain  50 tablet  0     Social History: History   Social History  . Marital Status: Married    Spouse Name: N/A    Number of Children: N/A  . Years of Education: N/A   Occupational History  . Not on file.   Social History Main Topics  . Smoking status: Never Smoker   . Smokeless tobacco: Not on file  . Alcohol Use: No     Comment: rare  . Drug Use: No  . Sexual Activity: Not on file   Other Topics Concern  . Not on file   Social History Narrative  . No narrative on file    Family History: History reviewed. No pertinent family history.  Review of Systems: Genitourinary, constitutional, skin, eye, otolaryngeal, hematologic/lymphatic, cardiovascular, pulmonary, endocrine, musculoskeletal, gastrointestinal, neurological and psychiatric system(s) were  reviewed and pertinent findings if present are noted.  Genitourinary: hematuria.  Musculoskeletal: back pain and joint pain.               Physical Exam: ENT:. The ears and nose are normal in appearance.  Neck: The appearance of the neck is normal.  Pulmonary: No respiratory distress.  Cardiovascular: Heart rate and rhythm are normal.  Abdomen: The abdomen is obese. The abdomen is soft and nontender. No suprapubic tenderness. No CVA tenderness.  Rectal: Rectal exam demonstrates normal sphincter tone, the anus is normal on inspection. and no tenderness. Estimated prostate size is 2+. Normal rectal tone, no rectal masses, prostate is smooth, symmetric and non-tender. The perineum is normal on inspection, no perineal tenderness.  Skin: Normal skin turgor and normal skin color and pigmentation.  Neuro/Psych:. Mood and affect are appropriate.   Studies:  Recent Labs     06/07/14  0907  HGB  12.9*    Recent Labs     06/07/14  0907  NA  142  K  3.7     No results found for this basename: PT, INR, APTT,  in the last 72 hours   No components found with this basename: ABG,     Assessment:    1.  Hydronephrotic/atrophic/nonfunctioning right kidney-this is asymptomatic at the present time from the standpoint of pain  2.  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

## 2014-06-07 NOTE — Transfer of Care (Signed)
Immediate Anesthesia Transfer of Care Note  Patient: [name]  Procedure(s) Performed: Procedure(s) (LRB): CYSTOSCOPY, RIGHT RETROGRADE, RIGHT URETEROSCOPY, RIGHT URETERAL BALLOON DILATATION, BIOPSY, RIGHT URETERAL STENT (Right)  Patient Location: PACU  Anesthesia Type: General  Level of Consciousness: awake, sedated, patient cooperative and responds to stimulation  Airway & Oxygen Therapy: Patient Spontanous Breathing and Patient connected to face mask oxygen  Post-op Assessment: Report given to PACU RN, Post -op Vital signs reviewed and stable and Patient moving all extremities  Post vital signs: Reviewed and stable  Complications: No apparent anesthesia complications

## 2014-06-07 NOTE — Anesthesia Preprocedure Evaluation (Addendum)
Anesthesia Evaluation  Patient identified by MRN, date of birth, ID band Patient awake    Reviewed: Allergy & Precautions, H&P , NPO status , Patient's Chart, lab work & pertinent test results  Airway Mallampati: III TM Distance: >3 FB Neck ROM: full    Dental no notable dental hx. (+) Teeth Intact, Dental Advisory Given   Pulmonary sleep apnea and Continuous Positive Airway Pressure Ventilation ,  breath sounds clear to auscultation  Pulmonary exam normal       Cardiovascular hypertension, Pt. on medications Rhythm:regular Rate:Normal     Neuro/Psych negative neurological ROS  negative psych ROS   GI/Hepatic negative GI ROS, Neg liver ROS, GERD-  Medicated and Controlled,  Endo/Other  negative endocrine ROS  Renal/GU negative Renal ROS  negative genitourinary   Musculoskeletal   Abdominal   Peds  Hematology negative hematology ROS (+)   Anesthesia Other Findings   Reproductive/Obstetrics negative OB ROS                          Anesthesia Physical Anesthesia Plan  ASA: III  Anesthesia Plan: General   Post-op Pain Management:    Induction: Intravenous  Airway Management Planned: LMA  Additional Equipment:   Intra-op Plan:   Post-operative Plan:   Informed Consent: I have reviewed the patients History and Physical, chart, labs and discussed the procedure including the risks, benefits and alternatives for the proposed anesthesia with the patient or authorized representative who has indicated his/her understanding and acceptance.   Dental Advisory Given  Plan Discussed with: CRNA and Surgeon  Anesthesia Plan Comments:         Anesthesia Quick Evaluation

## 2014-06-07 NOTE — Op Note (Signed)
PATIENT:  [name]  PRE-OPERATIVE DIAGNOSIS: Chronic right hydronephrosis/poorly functioning right kidney, right renal pelvic filling defect with history of hematuria  POST-OPERATIVE DIAGNOSIS: Same  PROCEDURE: Cystoscopy, right retrograde ureteropyelogram, right ureteroscopy/pyeloscopy, right renal washings, right renal pelvic biopsy, interpretive fluoroscopy, placement of right double-J stent-24 cm x 6 [place] contour with string  SURGEON:  Lillette Boxer. Lalitha Ilyas, M.D.  ANESTHESIA:  General  EBL:  Minimal  DRAINS: 24 cm x 6 French double-J stent-contour, with string  LOCAL MEDICATIONS USED:  None  SPECIMEN:  1. Right renal pelvic washings for cytology 2. Right renal pelvic biopsies  INDICATION: [name] is a 63 year old male with known right hydronephrosis and a poorly functioning right kidney. He recently presented with gross hematuria. Evaluation included CT which revealed a filling defect in his right renal pelvis. This was thought to be clot versus renal pelvic tumor. He presents at this time for diagnostic evaluation including cystoscopy and ureteroscopy.  Description of procedure: The patient was properly identified and marked (if applicable) in the holding area. They were then  taken to the operating room and placed on the table in a supine position. General anesthesia was then administered. Once fully anesthetized the patient was moved to the dorsolithotomy position and the genitalia and perineum were sterilely prepped and draped in standard fashion. An official timeout was then performed.  A 22 French panendoscope was passed under direct vision through his urethra. Urethra was without lesions or strictures. Prostatic urethra was nonobstructive. The bladder was entered and inspected circumferentially. Both the 12 and 70 scopes were used. There were no tumors trabeculations or foreign bodies. Ureteral orifices were normal in configuration and location. With the  assistance of a guidewire, the right ureteral orifice was cannulated with a 6 [place] open-ended catheter. Retrograde ureteropyelogram was performed.  This revealed a normal appearing ureter from the ureterovesical junction to the UPJ. There was obvious narrowing of the UPJ with beaking. Contrast readily entered the the patient's right renal pelvis. Calyces were severely blunted. I saw no evidence of filling defects. Prior to fluoroscopy, no evident calcifications were seen.  Following retrograde study, I advanced a sensor-tip guidewire through the open-ended catheter with a curl seen within the right renal pelvis. I attempted to pass the inner quarter of a 12/14 ureteral access sheath. This was unsuccessful due to the narrowing of the ureter. I then removed the inner core of the access sheath, and passed a 10 cm ureteral balloon dilator. With the assistance of fluoroscopy, I dilated the ureter throughout, from the ureterovesical junction up to the ureteropelvic junction. Dilation was performed for 2 minutes at each stage, up to a pressure of 16 atmosphere. Following this, the balloon dilator was removed. I was then easily able to pass first the inner core and in the entire ureteral access sheath into the right renal pelvis. The core was removed and a digital flexible ureteroscope was passed in the right renal pelvis. I first performed washings using 20 cc of saline in and out. This was sent for cytology labeled "right renal washings". Thorough inspection of the entire right pyelocalyceal system was performed, without any papillary lesions seen. Calyces were significantly blunted. There were 2-3 erythematous areas that, more than likely, were from abrasions from the digital ureteroscope. However, these were biopsied x2 with the bigopsy forceps and labeled "right renal pelvic biopsies". No further lesions were seen. Retroflexed view of the UPJ was performed. After thorough inspection, and no significant bleeding  from the biopsy sites,  the procedure was terminated. I removed the digital ureteroscope. Through the access sheath, a sensor-tip guidewire was placed. The guidewire was backloaded through a cystoscope, and under fluoroscopic guidance, a 24 cm x 6 French contour double-J stent was placed. A thread was left intact. The bladder was drained. A thread was then brought through the patient's urethra and taped to his penis.  The patient tolerated procedure well. He was awakened, extubated, and taken to the PACU in stable condition.    PLAN OF CARE: Discharge to home after PACU

## 2014-06-07 NOTE — Discharge Instructions (Addendum)
Ureteroscopy Ureteroscopy is a surgical procedure to check for and treat a problem inside part of your urinary tract. You may need this procedure if you have frequent urinary tract infections, blood in your urine, or a stone in one of the tubes that carries urine from your kidneys to your bladder (ureter). You may also have this procedure if your health care provider wants to check for an abnormal growth in your ureter. To do this, your surgeon passes a thin, flexible telescope (ureteroscope) into one or both ureters. LET [facility] CARE PROVIDER KNOW ABOUT:   Any allergies you have.  All medicines you are taking, including vitamins, herbs, eye drops, creams, and over-the-counter medicines.  Previous problems you or members of your family have had with the use of anesthetics.  Any blood disorders you have.  Previous surgeries you have had.  Medical conditions you have. RISKS AND COMPLICATIONS  Generally, this is a safe procedure. However, as with any procedure, problems can occur. Possible problems include:   Bleeding.  Pain.  Infection.  Scarring that narrows the ureter (stricture).  Creating a hole in the ureter (perforation). BEFORE THE PROCEDURE   You may be given a medicine to make you sleep during ureteroscopy (general anesthetic).  Do not eat or drink anything for 6-8 hours before the procedure.  You may also need to have a urine test before the procedure to check for any sign of infection.  You may be given an antibiotic medicine by injection or through an IV tube before the procedure to prevent infection.  Arrange for someone to take you home after the procedure. PROCEDURE  You may be given one of the following medicines for this procedure:  A medicine that makes you go to sleep (general anesthetic).  A medicine injected into your spine that numbs your body below the waist (spinal anesthetic). This procedure is usually done as outpatient surgery and usually takes  about 30 minutes. During the procedure:  The opening from which you urinate (urethra) will be cleaned with a germ-killing solution.  The ureteroscope will be passed through your urethra into your bladder.  A saltwater solution will flow through the ureteroscope to fill your bladder. This will help the surgeon see the openings of your ureters clearly.  The ureteroscope will then be passed into your ureter.  If a growth is found, your surgeon may take a piece of the growth (biopsy) to examine under a microscope.  If a stone is found, your surgeon may remove the stone through the ureteroscope or break up the stone using a laser, shock waves, or electrical energy.  In some cases, the surgeon may put in a tube that keeps your ureter open (ureteral stent).  When the procedure is over, the surgeon will remove the scope. Then your bladder will be emptied and you will go to the recovery area. AFTER THE PROCEDURE  After ureteroscopy, you will remain in the recovery area until the anesthesia wears off and your surgeon says you can go home.  Document Released: 10/17/2013 Document Reviewed: 10/17/2013 [facility] Patient Information 2015 [place], [place]. This information is not intended to replace advice given to you by your health care provider. Make sure you discuss any questions you have with your health care provider. [facility] [phone] Post Ureteroscopy With or Without Stent Instructions  Definitions:  Ureter: The duct that transports urine from the kidney to the bladder. Stent:   A plastic hollow tube that is placed into the ureter, from  the kidney to the  bladder to prevent the ureter from swelling shut.  GENERAL INSTRUCTIONS:  Despite the fact that no skin incisions were used, the area around the ureter and bladder is raw and irritated. The stent is a foreign body which will further irritate the bladder wall. This irritation is manifested by increased frequency of  urination, both day and night, and by an increase in the urge to urinate. In some, the urge to urinate is present almost always. Sometimes the urge is strong enough that you may not be able to stop yourself from urinating. The only real cure is to remove the stent and then give time for the bladder wall to heal which can't be done until the danger of the ureter swelling shut has passed, which varies. It is okay to remove the stent by pulling on the string on Monday morning.  You may see some blood in your urine while the stent is in place and a few days afterwards. Do not be alarmed, even if the urine was clear for a while. Get off your feet and drink lots of fluids until clearing occurs. If you start to pass clots or don't improve, call us.  DIET: You may return to your normal diet immediately. Because of the raw surface of your bladder, alcohol, spicy foods, acid type foods and drinks with caffeine may cause irritation or frequency and should be used in moderation. To keep your urine flowing freely and to avoid constipation, drink plenty of fluids during the day ( 8-10 glasses ). Tip: Avoid cranberry juice because it is very acidic.  ACTIVITY: Your physical activity doesn't need to be restricted. However, if you are very active, you may see some blood in your urine. We suggest that you reduce your activity under these circumstances until the bleeding has stopped.  BOWELS: It is important to keep your bowels regular during the postoperative period. Straining with bowel movements can cause bleeding. A bowel movement every other day is reasonable. Use a mild laxative if needed, such as Milk of Magnesia 2-3 tablespoons, or 2 Dulcolax tablets. Call if you continue to have problems. If you have been taking narcotics for pain, before, during or after your surgery, you may be constipated. Take a laxative if necessary.   MEDICATION: You should resume your pre-surgery medications unless told not to. In  addition you will often be given an antibiotic to prevent infection. These should be taken as prescribed until the bottles are finished unless you are having an unusual reaction to one of the drugs.  PROBLEMS YOU SHOULD REPORT TO [place]:  Fevers over 100.5 Fahrenheit.  Heavy bleeding, or clots ( See above notes about blood in urine ).  Inability to urinate.  Drug reactions ( hives, rash, nausea, vomiting, diarrhea ).  Severe burning or pain with urination that is not improving.  FOLLOW-UP: You will need a follow-up appointment to monitor your progress. Call for this appointment at the number listed above. Usually the first appointment will be about three to fourteen days after your surgery.      Post Anesthesia Home Care Instructions  Activity: Get plenty of rest for the remainder of the day. A responsible adult should stay with you for 24 hours following the procedure.  For the next 24 hours, DO NOT: -Drive a car -Paediatric nurse -Drink alcoholic beverages -Take any medication unless instructed by your physician -Make any legal decisions or sign important papers.  Meals: Start with liquid foods such as  gelatin or soup. Progress to regular foods as tolerated. Avoid greasy, spicy, heavy foods. If nausea and/or vomiting occur, drink only clear liquids until the nausea and/or vomiting subsides. Call your physician if vomiting continues. ° °Special Instructions/Symptoms: °Your throat may feel dry or sore from the anesthesia or the breathing tube placed in your throat during surgery. If this causes discomfort, gargle with warm salt water. The discomfort should disappear within 24 hours. ° °

## 2014-06-08 ENCOUNTER — Other Ambulatory Visit: Payer: Self-pay | Admitting: Orthopedic Surgery

## 2014-06-08 ENCOUNTER — Encounter (HOSPITAL_BASED_OUTPATIENT_CLINIC_OR_DEPARTMENT_OTHER): Payer: Self-pay | Admitting: Urology

## 2014-06-08 DIAGNOSIS — M5442 Lumbago with sciatica, left side: Secondary | ICD-10-CM

## 2014-06-18 ENCOUNTER — Ambulatory Visit (INDEPENDENT_AMBULATORY_CARE_PROVIDER_SITE_OTHER): Payer: PRIVATE HEALTH INSURANCE | Admitting: Orthopedic Surgery

## 2014-06-18 VITALS — BP 144/91 | Ht 68.0 in | Wt 223.0 lb

## 2014-06-18 DIAGNOSIS — M48061 Spinal stenosis, lumbar region without neurogenic claudication: Secondary | ICD-10-CM

## 2014-06-18 NOTE — Progress Notes (Signed)
Chief Complaint  Patient presents with  . Follow-up    4 week follow up Left knee s/p medication change and ESI   BP 144/91  Ht 5\' 8"  (1.727 m)  Wt 223 lb (101.152 kg)  BMI 33.91 kg/m2  The patient improved 98% with one ESI and he is very happy. He will get the second one in September if he becomes more symptomatic.  His left leg pain is gone his right leg pain is gone is not having any back pain  Recommend followup as needed

## 2014-07-05 DIAGNOSIS — M479 Spondylosis, unspecified: Secondary | ICD-10-CM | POA: Diagnosis not present

## 2014-07-05 DIAGNOSIS — Z6833 Body mass index (BMI) 33.0-33.9, adult: Secondary | ICD-10-CM | POA: Diagnosis not present

## 2014-07-06 ENCOUNTER — Ambulatory Visit
Admission: RE | Admit: 2014-07-06 | Discharge: 2014-07-06 | Disposition: A | Discharge status: Home / Self Care | Payer: Medicare Other | Source: Ambulatory Visit | Attending: Orthopedic Surgery | Admitting: Orthopedic Surgery

## 2014-07-06 DIAGNOSIS — M545 Low back pain, unspecified: Secondary | ICD-10-CM | POA: Diagnosis not present

## 2014-07-06 DIAGNOSIS — M5442 Lumbago with sciatica, left side: Secondary | ICD-10-CM

## 2014-07-06 MED ORDER — METHYLPREDNISOLONE ACETATE 40 MG/ML INJ SUSP (RADIOLOG
120.0000 mg | Freq: Once | INTRAMUSCULAR | Status: AC
  Administered 2014-07-06: 120 mg via EPIDURAL

## 2014-07-06 MED ORDER — IOHEXOL 180 MG/ML  SOLN
1.0000 mL | Freq: Once | INTRAMUSCULAR | Status: AC | PRN
  Administered 2014-07-06: 1 mL via EPIDURAL

## 2014-07-09 DIAGNOSIS — M79609 Pain in unspecified limb: Secondary | ICD-10-CM | POA: Diagnosis not present

## 2014-07-10 ENCOUNTER — Ambulatory Visit (INDEPENDENT_AMBULATORY_CARE_PROVIDER_SITE_OTHER): Payer: Medicare Other | Admitting: Urology

## 2014-07-10 DIAGNOSIS — R310 Gross hematuria: Secondary | ICD-10-CM | POA: Diagnosis not present

## 2014-08-15 DIAGNOSIS — G4730 Sleep apnea, unspecified: Secondary | ICD-10-CM | POA: Diagnosis not present

## 2014-08-15 DIAGNOSIS — G4700 Insomnia, unspecified: Secondary | ICD-10-CM | POA: Diagnosis not present

## 2014-08-15 DIAGNOSIS — N189 Chronic kidney disease, unspecified: Secondary | ICD-10-CM | POA: Diagnosis not present

## 2014-08-15 DIAGNOSIS — Z23 Encounter for immunization: Secondary | ICD-10-CM | POA: Diagnosis not present

## 2014-08-15 DIAGNOSIS — K210 Gastro-esophageal reflux disease with esophagitis: Secondary | ICD-10-CM | POA: Diagnosis not present

## 2014-08-20 DIAGNOSIS — N183 Chronic kidney disease, stage 3 (moderate): Secondary | ICD-10-CM | POA: Diagnosis not present

## 2014-08-20 DIAGNOSIS — Z79899 Other long term (current) drug therapy: Secondary | ICD-10-CM | POA: Diagnosis not present

## 2014-08-20 DIAGNOSIS — R809 Proteinuria, unspecified: Secondary | ICD-10-CM | POA: Diagnosis not present

## 2014-08-20 DIAGNOSIS — E559 Vitamin D deficiency, unspecified: Secondary | ICD-10-CM | POA: Diagnosis not present

## 2014-08-20 DIAGNOSIS — D649 Anemia, unspecified: Secondary | ICD-10-CM | POA: Diagnosis not present

## 2014-08-20 DIAGNOSIS — I10 Essential (primary) hypertension: Secondary | ICD-10-CM | POA: Diagnosis not present

## 2014-08-22 DIAGNOSIS — D649 Anemia, unspecified: Secondary | ICD-10-CM | POA: Diagnosis not present

## 2014-08-22 DIAGNOSIS — I129 Hypertensive chronic kidney disease with stage 1 through stage 4 chronic kidney disease, or unspecified chronic kidney disease: Secondary | ICD-10-CM | POA: Diagnosis not present

## 2014-08-22 DIAGNOSIS — N2581 Secondary hyperparathyroidism of renal origin: Secondary | ICD-10-CM | POA: Diagnosis not present

## 2014-08-22 DIAGNOSIS — N183 Chronic kidney disease, stage 3 (moderate): Secondary | ICD-10-CM | POA: Diagnosis not present

## 2014-08-30 ENCOUNTER — Ambulatory Visit (INDEPENDENT_AMBULATORY_CARE_PROVIDER_SITE_OTHER): Payer: Medicare Other | Admitting: Otolaryngology

## 2014-08-30 DIAGNOSIS — H903 Sensorineural hearing loss, bilateral: Secondary | ICD-10-CM

## 2014-10-29 DIAGNOSIS — M1851 Other unilateral secondary osteoarthritis of first carpometacarpal joint, right hand: Secondary | ICD-10-CM | POA: Diagnosis not present

## 2014-10-29 DIAGNOSIS — M1852 Other unilateral secondary osteoarthritis of first carpometacarpal joint, left hand: Secondary | ICD-10-CM | POA: Diagnosis not present

## 2014-10-30 DIAGNOSIS — G2581 Restless legs syndrome: Secondary | ICD-10-CM | POA: Diagnosis not present

## 2014-10-30 DIAGNOSIS — M791 Myalgia: Secondary | ICD-10-CM | POA: Diagnosis not present

## 2014-11-30 DIAGNOSIS — Z8739 Personal history of other diseases of the musculoskeletal system and connective tissue: Secondary | ICD-10-CM | POA: Diagnosis not present

## 2014-11-30 DIAGNOSIS — I10 Essential (primary) hypertension: Secondary | ICD-10-CM | POA: Diagnosis not present

## 2014-11-30 DIAGNOSIS — N183 Chronic kidney disease, stage 3 (moderate): Secondary | ICD-10-CM | POA: Diagnosis not present

## 2014-11-30 DIAGNOSIS — R809 Proteinuria, unspecified: Secondary | ICD-10-CM | POA: Diagnosis not present

## 2014-11-30 DIAGNOSIS — K210 Gastro-esophageal reflux disease with esophagitis: Secondary | ICD-10-CM | POA: Diagnosis not present

## 2014-11-30 DIAGNOSIS — N189 Chronic kidney disease, unspecified: Secondary | ICD-10-CM | POA: Diagnosis not present

## 2014-11-30 DIAGNOSIS — E559 Vitamin D deficiency, unspecified: Secondary | ICD-10-CM | POA: Diagnosis not present

## 2014-11-30 DIAGNOSIS — Z0000 Encounter for general adult medical examination without abnormal findings: Secondary | ICD-10-CM | POA: Diagnosis not present

## 2014-11-30 DIAGNOSIS — G4730 Sleep apnea, unspecified: Secondary | ICD-10-CM | POA: Diagnosis not present

## 2014-11-30 DIAGNOSIS — Z79899 Other long term (current) drug therapy: Secondary | ICD-10-CM | POA: Diagnosis not present

## 2014-11-30 DIAGNOSIS — D649 Anemia, unspecified: Secondary | ICD-10-CM | POA: Diagnosis not present

## 2014-11-30 DIAGNOSIS — G4700 Insomnia, unspecified: Secondary | ICD-10-CM | POA: Diagnosis not present

## 2014-11-30 DIAGNOSIS — M791 Myalgia: Secondary | ICD-10-CM | POA: Diagnosis not present

## 2014-11-30 DIAGNOSIS — Z87442 Personal history of urinary calculi: Secondary | ICD-10-CM | POA: Diagnosis not present

## 2014-12-04 DIAGNOSIS — Z0000 Encounter for general adult medical examination without abnormal findings: Secondary | ICD-10-CM | POA: Diagnosis not present

## 2014-12-04 DIAGNOSIS — Z1211 Encounter for screening for malignant neoplasm of colon: Secondary | ICD-10-CM | POA: Diagnosis not present

## 2015-01-25 ENCOUNTER — Other Ambulatory Visit: Payer: Self-pay | Admitting: Neurological Surgery

## 2015-01-25 DIAGNOSIS — M47812 Spondylosis without myelopathy or radiculopathy, cervical region: Secondary | ICD-10-CM

## 2015-01-30 ENCOUNTER — Ambulatory Visit
Admission: RE | Admit: 2015-01-30 | Discharge: 2015-01-30 | Disposition: A | Discharge status: Home / Self Care | Payer: Medicare Other | Source: Ambulatory Visit | Attending: Neurological Surgery | Admitting: Neurological Surgery

## 2015-01-30 DIAGNOSIS — M47812 Spondylosis without myelopathy or radiculopathy, cervical region: Secondary | ICD-10-CM

## 2015-01-30 DIAGNOSIS — M542 Cervicalgia: Secondary | ICD-10-CM | POA: Diagnosis not present

## 2015-01-30 MED ORDER — TRIAMCINOLONE ACETONIDE 40 MG/ML IJ SUSP (RADIOLOGY)
60.0000 mg | Freq: Once | INTRAMUSCULAR | Status: AC
  Administered 2015-01-30: 60 mg via EPIDURAL

## 2015-01-30 MED ORDER — IOHEXOL 300 MG/ML  SOLN
1.0000 mL | Freq: Once | INTRAMUSCULAR | Status: AC | PRN
  Administered 2015-01-30: 1 mL via EPIDURAL

## 2015-02-01 DIAGNOSIS — M1811 Unilateral primary osteoarthritis of first carpometacarpal joint, right hand: Secondary | ICD-10-CM | POA: Diagnosis not present

## 2015-02-01 DIAGNOSIS — M1812 Unilateral primary osteoarthritis of first carpometacarpal joint, left hand: Secondary | ICD-10-CM | POA: Diagnosis not present

## 2015-04-05 DIAGNOSIS — I10 Essential (primary) hypertension: Secondary | ICD-10-CM | POA: Diagnosis not present

## 2015-04-05 DIAGNOSIS — N183 Chronic kidney disease, stage 3 (moderate): Secondary | ICD-10-CM | POA: Diagnosis not present

## 2015-04-05 DIAGNOSIS — Z79899 Other long term (current) drug therapy: Secondary | ICD-10-CM | POA: Diagnosis not present

## 2015-04-05 DIAGNOSIS — D649 Anemia, unspecified: Secondary | ICD-10-CM | POA: Diagnosis not present

## 2015-04-05 DIAGNOSIS — E559 Vitamin D deficiency, unspecified: Secondary | ICD-10-CM | POA: Diagnosis not present

## 2015-04-05 DIAGNOSIS — R809 Proteinuria, unspecified: Secondary | ICD-10-CM | POA: Diagnosis not present

## 2015-04-22 ENCOUNTER — Other Ambulatory Visit: Payer: Self-pay

## 2015-04-24 DIAGNOSIS — N183 Chronic kidney disease, stage 3 (moderate): Secondary | ICD-10-CM | POA: Diagnosis not present

## 2015-04-24 DIAGNOSIS — E870 Hyperosmolality and hypernatremia: Secondary | ICD-10-CM | POA: Diagnosis not present

## 2015-04-24 DIAGNOSIS — N2581 Secondary hyperparathyroidism of renal origin: Secondary | ICD-10-CM | POA: Diagnosis not present

## 2015-04-24 DIAGNOSIS — I10 Essential (primary) hypertension: Secondary | ICD-10-CM | POA: Diagnosis not present

## 2015-04-24 DIAGNOSIS — D649 Anemia, unspecified: Secondary | ICD-10-CM | POA: Diagnosis not present

## 2015-08-13 DIAGNOSIS — G8918 Other acute postprocedural pain: Secondary | ICD-10-CM | POA: Diagnosis not present

## 2015-08-13 DIAGNOSIS — M1812 Unilateral primary osteoarthritis of first carpometacarpal joint, left hand: Secondary | ICD-10-CM | POA: Diagnosis not present

## 2015-08-13 DIAGNOSIS — M19041 Primary osteoarthritis, right hand: Secondary | ICD-10-CM | POA: Diagnosis not present

## 2015-08-13 DIAGNOSIS — M1811 Unilateral primary osteoarthritis of first carpometacarpal joint, right hand: Secondary | ICD-10-CM | POA: Diagnosis not present

## 2015-08-13 DIAGNOSIS — M19042 Primary osteoarthritis, left hand: Secondary | ICD-10-CM | POA: Diagnosis not present

## 2015-08-28 DIAGNOSIS — Z4789 Encounter for other orthopedic aftercare: Secondary | ICD-10-CM | POA: Diagnosis not present

## 2015-08-28 DIAGNOSIS — M1811 Unilateral primary osteoarthritis of first carpometacarpal joint, right hand: Secondary | ICD-10-CM | POA: Diagnosis not present

## 2015-08-30 DIAGNOSIS — R809 Proteinuria, unspecified: Secondary | ICD-10-CM | POA: Diagnosis not present

## 2015-08-30 DIAGNOSIS — I10 Essential (primary) hypertension: Secondary | ICD-10-CM | POA: Diagnosis not present

## 2015-08-30 DIAGNOSIS — N183 Chronic kidney disease, stage 3 (moderate): Secondary | ICD-10-CM | POA: Diagnosis not present

## 2015-08-30 DIAGNOSIS — Z79899 Other long term (current) drug therapy: Secondary | ICD-10-CM | POA: Diagnosis not present

## 2015-08-30 DIAGNOSIS — D649 Anemia, unspecified: Secondary | ICD-10-CM | POA: Diagnosis not present

## 2015-08-30 DIAGNOSIS — E559 Vitamin D deficiency, unspecified: Secondary | ICD-10-CM | POA: Diagnosis not present

## 2015-09-02 ENCOUNTER — Other Ambulatory Visit: Payer: Self-pay | Admitting: Neurological Surgery

## 2015-09-02 DIAGNOSIS — M47812 Spondylosis without myelopathy or radiculopathy, cervical region: Secondary | ICD-10-CM

## 2015-09-04 DIAGNOSIS — I10 Essential (primary) hypertension: Secondary | ICD-10-CM | POA: Diagnosis not present

## 2015-09-04 DIAGNOSIS — E870 Hyperosmolality and hypernatremia: Secondary | ICD-10-CM | POA: Diagnosis not present

## 2015-09-04 DIAGNOSIS — N2581 Secondary hyperparathyroidism of renal origin: Secondary | ICD-10-CM | POA: Diagnosis not present

## 2015-09-04 DIAGNOSIS — N183 Chronic kidney disease, stage 3 (moderate): Secondary | ICD-10-CM | POA: Diagnosis not present

## 2015-09-04 DIAGNOSIS — D649 Anemia, unspecified: Secondary | ICD-10-CM | POA: Diagnosis not present

## 2015-09-10 ENCOUNTER — Inpatient Hospital Stay: Admission: RE | Admit: 2015-09-10 | Payer: Medicare Other | Source: Ambulatory Visit

## 2015-09-10 DIAGNOSIS — Z23 Encounter for immunization: Secondary | ICD-10-CM | POA: Diagnosis not present

## 2015-09-12 ENCOUNTER — Inpatient Hospital Stay: Admission: RE | Admit: 2015-09-12 | Payer: Medicare Other | Source: Ambulatory Visit

## 2015-09-16 DIAGNOSIS — Z4789 Encounter for other orthopedic aftercare: Secondary | ICD-10-CM | POA: Diagnosis not present

## 2015-09-25 DIAGNOSIS — Z4789 Encounter for other orthopedic aftercare: Secondary | ICD-10-CM | POA: Diagnosis not present

## 2015-09-25 DIAGNOSIS — M79644 Pain in right finger(s): Secondary | ICD-10-CM | POA: Diagnosis not present

## 2015-09-26 DIAGNOSIS — G4700 Insomnia, unspecified: Secondary | ICD-10-CM | POA: Diagnosis not present

## 2015-09-26 DIAGNOSIS — G4730 Sleep apnea, unspecified: Secondary | ICD-10-CM | POA: Diagnosis not present

## 2015-10-03 DIAGNOSIS — G4730 Sleep apnea, unspecified: Secondary | ICD-10-CM | POA: Diagnosis not present

## 2015-10-03 DIAGNOSIS — G2581 Restless legs syndrome: Secondary | ICD-10-CM | POA: Diagnosis not present

## 2015-10-03 DIAGNOSIS — G4700 Insomnia, unspecified: Secondary | ICD-10-CM | POA: Diagnosis not present

## 2015-10-03 DIAGNOSIS — M79644 Pain in right finger(s): Secondary | ICD-10-CM | POA: Diagnosis not present

## 2015-10-10 DIAGNOSIS — M79644 Pain in right finger(s): Secondary | ICD-10-CM | POA: Diagnosis not present

## 2015-10-14 DIAGNOSIS — M79644 Pain in right finger(s): Secondary | ICD-10-CM | POA: Diagnosis not present

## 2015-10-17 DIAGNOSIS — M79644 Pain in right finger(s): Secondary | ICD-10-CM | POA: Diagnosis not present

## 2015-10-23 DIAGNOSIS — M79644 Pain in right finger(s): Secondary | ICD-10-CM | POA: Diagnosis not present

## 2015-10-24 DIAGNOSIS — Z4789 Encounter for other orthopedic aftercare: Secondary | ICD-10-CM | POA: Diagnosis not present

## 2015-10-24 DIAGNOSIS — M79644 Pain in right finger(s): Secondary | ICD-10-CM | POA: Diagnosis not present

## 2015-10-29 DIAGNOSIS — M79644 Pain in right finger(s): Secondary | ICD-10-CM | POA: Diagnosis not present

## 2015-10-31 DIAGNOSIS — M79644 Pain in right finger(s): Secondary | ICD-10-CM | POA: Diagnosis not present

## 2015-11-04 DIAGNOSIS — M79644 Pain in right finger(s): Secondary | ICD-10-CM | POA: Diagnosis not present

## 2015-11-06 DIAGNOSIS — M79644 Pain in right finger(s): Secondary | ICD-10-CM | POA: Diagnosis not present

## 2015-11-13 DIAGNOSIS — M79644 Pain in right finger(s): Secondary | ICD-10-CM | POA: Diagnosis not present

## 2015-11-15 DIAGNOSIS — M1811 Unilateral primary osteoarthritis of first carpometacarpal joint, right hand: Secondary | ICD-10-CM | POA: Diagnosis not present

## 2015-11-15 DIAGNOSIS — Z4789 Encounter for other orthopedic aftercare: Secondary | ICD-10-CM | POA: Diagnosis not present

## 2015-11-18 DIAGNOSIS — M79644 Pain in right finger(s): Secondary | ICD-10-CM | POA: Diagnosis not present

## 2015-12-10 DIAGNOSIS — M79644 Pain in right finger(s): Secondary | ICD-10-CM | POA: Diagnosis not present

## 2015-12-12 DIAGNOSIS — M79644 Pain in right finger(s): Secondary | ICD-10-CM | POA: Diagnosis not present

## 2015-12-24 DIAGNOSIS — M79644 Pain in right finger(s): Secondary | ICD-10-CM | POA: Diagnosis not present

## 2015-12-27 DIAGNOSIS — Z4789 Encounter for other orthopedic aftercare: Secondary | ICD-10-CM | POA: Diagnosis not present

## 2015-12-30 DIAGNOSIS — M79644 Pain in right finger(s): Secondary | ICD-10-CM | POA: Diagnosis not present

## 2016-01-03 DIAGNOSIS — M791 Myalgia: Secondary | ICD-10-CM | POA: Diagnosis not present

## 2016-01-03 DIAGNOSIS — Z8739 Personal history of other diseases of the musculoskeletal system and connective tissue: Secondary | ICD-10-CM | POA: Diagnosis not present

## 2016-01-03 DIAGNOSIS — R809 Proteinuria, unspecified: Secondary | ICD-10-CM | POA: Diagnosis not present

## 2016-01-03 DIAGNOSIS — Z79899 Other long term (current) drug therapy: Secondary | ICD-10-CM | POA: Diagnosis not present

## 2016-01-03 DIAGNOSIS — G4730 Sleep apnea, unspecified: Secondary | ICD-10-CM | POA: Diagnosis not present

## 2016-01-03 DIAGNOSIS — Z87442 Personal history of urinary calculi: Secondary | ICD-10-CM | POA: Diagnosis not present

## 2016-01-03 DIAGNOSIS — N183 Chronic kidney disease, stage 3 (moderate): Secondary | ICD-10-CM | POA: Diagnosis not present

## 2016-01-03 DIAGNOSIS — D509 Iron deficiency anemia, unspecified: Secondary | ICD-10-CM | POA: Diagnosis not present

## 2016-01-03 DIAGNOSIS — E559 Vitamin D deficiency, unspecified: Secondary | ICD-10-CM | POA: Diagnosis not present

## 2016-01-03 DIAGNOSIS — G2581 Restless legs syndrome: Secondary | ICD-10-CM | POA: Diagnosis not present

## 2016-01-03 DIAGNOSIS — I10 Essential (primary) hypertension: Secondary | ICD-10-CM | POA: Diagnosis not present

## 2016-01-03 DIAGNOSIS — N189 Chronic kidney disease, unspecified: Secondary | ICD-10-CM | POA: Diagnosis not present

## 2016-01-08 DIAGNOSIS — D649 Anemia, unspecified: Secondary | ICD-10-CM | POA: Diagnosis not present

## 2016-01-08 DIAGNOSIS — N183 Chronic kidney disease, stage 3 (moderate): Secondary | ICD-10-CM | POA: Diagnosis not present

## 2016-01-08 DIAGNOSIS — E870 Hyperosmolality and hypernatremia: Secondary | ICD-10-CM | POA: Diagnosis not present

## 2016-01-08 DIAGNOSIS — I10 Essential (primary) hypertension: Secondary | ICD-10-CM | POA: Diagnosis not present

## 2016-01-09 DIAGNOSIS — Z0000 Encounter for general adult medical examination without abnormal findings: Secondary | ICD-10-CM | POA: Diagnosis not present

## 2016-01-09 DIAGNOSIS — Z1211 Encounter for screening for malignant neoplasm of colon: Secondary | ICD-10-CM | POA: Diagnosis not present

## 2016-01-20 ENCOUNTER — Ambulatory Visit
Admission: RE | Admit: 2016-01-20 | Discharge: 2016-01-20 | Disposition: A | Discharge status: Home / Self Care | Payer: Medicare Other | Source: Ambulatory Visit | Attending: Neurological Surgery | Admitting: Neurological Surgery

## 2016-01-20 DIAGNOSIS — M542 Cervicalgia: Secondary | ICD-10-CM | POA: Diagnosis not present

## 2016-01-20 DIAGNOSIS — M47812 Spondylosis without myelopathy or radiculopathy, cervical region: Secondary | ICD-10-CM

## 2016-01-20 MED ORDER — TRIAMCINOLONE ACETONIDE 40 MG/ML IJ SUSP (RADIOLOGY)
60.0000 mg | Freq: Once | INTRAMUSCULAR | Status: AC
  Administered 2016-01-20: 60 mg via EPIDURAL

## 2016-01-20 MED ORDER — IOHEXOL 300 MG/ML  SOLN
1.0000 mL | Freq: Once | INTRAMUSCULAR | Status: AC | PRN
  Administered 2016-01-20: 1 mL via EPIDURAL

## 2016-01-23 ENCOUNTER — Ambulatory Visit (INDEPENDENT_AMBULATORY_CARE_PROVIDER_SITE_OTHER): Payer: Medicare Other | Admitting: Orthopedic Surgery

## 2016-01-23 DIAGNOSIS — M4806 Spinal stenosis, lumbar region: Secondary | ICD-10-CM | POA: Diagnosis not present

## 2016-01-23 DIAGNOSIS — M48061 Spinal stenosis, lumbar region without neurogenic claudication: Secondary | ICD-10-CM

## 2016-01-23 NOTE — Addendum Note (Signed)
Addended by: Christia Reading on: 01/23/2016 06:22 PM   Modules accepted: Orders

## 2016-01-23 NOTE — Patient Instructions (Signed)
ESI LUMBAR

## 2016-01-23 NOTE — Progress Notes (Signed)
Radicular pain left leg recurrent  HPI 65 years old history of lumbar disc disease treated with epidural injections did very well. Over the last 6-8 weeks she's had increasing left lateral leg pain exacerbated by standing. No bowel or bladder symptoms at this time. Pain is increasing. No weakness.  No new symptoms from when he had last time he says it feels exactly the same with left lateral leg pain radiating from his back across his hip into his left knee  He did have epidural injections in his neck a [place] [facility] this week and they told him he could have them again in his back on April 10  Review of Systems  Constitutional: Negative for fever, chills, weight loss and malaise/fatigue.  Gastrointestinal: Negative for constipation.  Genitourinary: Negative for dysuria.  Neurological: Positive for sensory change. Negative for focal weakness and weakness.    Past Medical History  Diagnosis Date  . Hyperlipidemia   . Sleep apnea     uses Cpap  . GERD (gastroesophageal reflux disease)   . Kidney function abnormal   . 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 Hypertension   . Arthritis     spinal stenosis    Past Surgical History  Procedure Laterality Date  . Kidney stone  1983  . Left foot surgery  2010  . Colonoscopy  05/25/2012    Procedure: COLONOSCOPY;  Surgeon: Rogene Houston, MD;  [facility];  Service: Endoscopy;  Laterality: N/A;  1030  . Cataract extraction      10-15 years ago  . Cystoscopy/retrograde/ureteroscopy Right 06/07/2014    Procedure: CYSTOSCOPY, RIGHT RETROGRADE, RIGHT URETEROSCOPY, RIGHT URETERAL BALLOON DILATATION, BIOPSY, RIGHT URETERAL STENT;  Surgeon: Jorja Loa, MD;  Location: [facility];  Service: Urology;  Laterality: Right;   No family history on file. Social History  Substance Use Topics  . Smoking status: Never Smoker   . Smokeless tobacco: Not on file  . Alcohol Use: No     Comment: rare     Current outpatient prescriptions:  .  amitriptyline (ELAVIL) 25 MG tablet, Take 25 mg by mouth at bedtime., Disp: , Rfl:  .  ciprofloxacin (CIPRO) 250 MG tablet, Take 1 tablet (250 mg total) by mouth 2 (two) times daily., Disp: 6 tablet, Rfl: 0 .  gabapentin (NEURONTIN) 100 MG capsule, Take 1 capsule (100 mg total) by mouth at bedtime., Disp: 60 capsule, Rfl: 5 .  gabapentin (NEURONTIN) 100 MG capsule, Take 1 capsule (100 mg total) by mouth 3 (three) times daily., Disp: 90 capsule, Rfl: 2 .  hydrochlorothiazide (MICROZIDE) 12.5 MG capsule, Take 12.5 mg by mouth daily., Disp: , Rfl:  .  HYDROcodone-acetaminophen (NORCO/VICODIN) 5-325 MG per tablet, Take 1 tablet by mouth every 6 (six) hours as needed for moderate pain., Disp: 60 tablet, Rfl: 0 .  Multiple Vitamins-Minerals (MULTIVITAMIN WITH MINERALS) tablet, Take 1 tablet by mouth daily., Disp: , Rfl:  .  omeprazole (PRILOSEC) 20 MG capsule, Take 20 mg by mouth 2 (two) times daily., Disp: , Rfl:  .  oxybutynin (DITROPAN) 5 MG tablet, Take 1 tablet (5 mg total) by mouth 3 (three) times daily., Disp: 30 tablet, Rfl: 1 .  oxyCODONE (OXY IR/ROXICODONE) 5 MG immediate release tablet, Take 1-2 tablets as needed for pain, Disp: 50 tablet, Rfl: 0 .  pyridOXINE (VITAMIN B-6) 100 MG tablet, Take 1 tablet (100 mg total) by mouth 2 (two) times daily., Disp: 60 tablet, Rfl:  1 .  rosuvastatin (CRESTOR) 10 MG tablet, Take 10 mg by mouth at bedtime., Disp: , Rfl:  .  Testosterone (ANDROGEL PUMP) 1.25 GM/ACT (1%) GEL, Place 1 application onto the skin every evening. , Disp: , Rfl:  .  traMADol (ULTRAM) 50 MG tablet, Take 50 mg by mouth 3 (three) times daily., Disp: , Rfl:  .  zolpidem (AMBIEN) 10 MG tablet, Take 10 mg by mouth at bedtime., Disp: , Rfl:   There were no vitals taken for this visit.  Physical Exam  Constitutional: He is oriented to person, place, and time. He appears well-developed and well-nourished. No distress.  Cardiovascular: Normal rate  and intact distal pulses.   Musculoskeletal:  Gait is normal. Strength normal in both lower extremities  Normal sensation both lower extremities Normal reflexes at the knee and ankle in both lower extremities  Negative straight leg raise bilaterally  Nontender lumbar spine normal muscle tension lumbar spine left knee range of motion normal no effusion knee stable        Neurological: He is alert and oriented to person, place, and time.  Skin: Skin is warm and dry. No rash noted. He is not diaphoretic. No erythema. No pallor.  Psychiatric: He has a normal mood and affect. His behavior is normal. Judgment and thought content normal.    Ortho Exam   ASSESSMENT: The most recent MRI  IMPRESSION: 1. Transitional lumbosacral anatomy. The transitional segment is S1. 2. Moderate multifactorial spinal stenosis at L4-5. Although there is a small synovial cyst projecting medially from the right facet joint, no asymmetric lateral recess or foraminal stenosis is apparent to account for the right radicular symptoms. Both lateral recesses and foramina are mildly narrowed. 3. Mild multifactorial spinal and right foraminal stenosis at L5-S1. 4. 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   Electronically Signed  By: Camie Patience M.D.  On: 05/22/2014 15:45          PLAN Set up ESI's a  imaging for April 10

## 2016-01-29 ENCOUNTER — Other Ambulatory Visit: Payer: Self-pay | Admitting: *Deleted

## 2016-01-29 ENCOUNTER — Telehealth: Payer: Self-pay | Admitting: *Deleted

## 2016-01-29 NOTE — Telephone Encounter (Signed)
PATIENT SCHEDULED AT [facility] FOR [facility] 02/03/16 AT 10:00AM PATIENT AWARE

## 2016-02-03 ENCOUNTER — Ambulatory Visit
Admission: RE | Admit: 2016-02-03 | Discharge: 2016-02-03 | Disposition: A | Discharge status: Home / Self Care | Payer: Medicare Other | Source: Ambulatory Visit | Attending: Orthopedic Surgery | Admitting: Orthopedic Surgery

## 2016-02-03 DIAGNOSIS — M48061 Spinal stenosis, lumbar region without neurogenic claudication: Secondary | ICD-10-CM

## 2016-02-03 DIAGNOSIS — M545 Low back pain: Secondary | ICD-10-CM | POA: Diagnosis not present

## 2016-02-03 MED ORDER — METHYLPREDNISOLONE ACETATE 40 MG/ML INJ SUSP (RADIOLOG
120.0000 mg | Freq: Once | INTRAMUSCULAR | Status: AC
  Administered 2016-02-03: 120 mg via EPIDURAL

## 2016-02-03 MED ORDER — IOHEXOL 180 MG/ML  SOLN
1.0000 mL | Freq: Once | INTRAMUSCULAR | Status: AC | PRN
  Administered 2016-02-03: 1 mL via EPIDURAL

## 2016-03-02 DIAGNOSIS — Z4789 Encounter for other orthopedic aftercare: Secondary | ICD-10-CM | POA: Diagnosis not present

## 2016-03-05 DIAGNOSIS — G2581 Restless legs syndrome: Secondary | ICD-10-CM | POA: Diagnosis not present

## 2016-03-05 DIAGNOSIS — N189 Chronic kidney disease, unspecified: Secondary | ICD-10-CM | POA: Diagnosis not present

## 2016-03-05 DIAGNOSIS — G4730 Sleep apnea, unspecified: Secondary | ICD-10-CM | POA: Diagnosis not present

## 2016-03-26 DIAGNOSIS — N189 Chronic kidney disease, unspecified: Secondary | ICD-10-CM | POA: Diagnosis not present

## 2016-03-26 DIAGNOSIS — G4730 Sleep apnea, unspecified: Secondary | ICD-10-CM | POA: Diagnosis not present

## 2016-03-26 DIAGNOSIS — G2581 Restless legs syndrome: Secondary | ICD-10-CM | POA: Diagnosis not present

## 2016-05-08 DIAGNOSIS — K210 Gastro-esophageal reflux disease with esophagitis: Secondary | ICD-10-CM | POA: Diagnosis not present

## 2016-05-08 DIAGNOSIS — G4733 Obstructive sleep apnea (adult) (pediatric): Secondary | ICD-10-CM | POA: Diagnosis not present

## 2016-05-08 DIAGNOSIS — G2581 Restless legs syndrome: Secondary | ICD-10-CM | POA: Diagnosis not present

## 2016-05-08 DIAGNOSIS — G4700 Insomnia, unspecified: Secondary | ICD-10-CM | POA: Diagnosis not present

## 2016-05-29 DIAGNOSIS — R809 Proteinuria, unspecified: Secondary | ICD-10-CM | POA: Diagnosis not present

## 2016-05-29 DIAGNOSIS — E559 Vitamin D deficiency, unspecified: Secondary | ICD-10-CM | POA: Diagnosis not present

## 2016-05-29 DIAGNOSIS — I10 Essential (primary) hypertension: Secondary | ICD-10-CM | POA: Diagnosis not present

## 2016-05-29 DIAGNOSIS — N183 Chronic kidney disease, stage 3 (moderate): Secondary | ICD-10-CM | POA: Diagnosis not present

## 2016-05-29 DIAGNOSIS — D509 Iron deficiency anemia, unspecified: Secondary | ICD-10-CM | POA: Diagnosis not present

## 2016-05-29 DIAGNOSIS — Z79899 Other long term (current) drug therapy: Secondary | ICD-10-CM | POA: Diagnosis not present

## 2016-06-03 DIAGNOSIS — E669 Obesity, unspecified: Secondary | ICD-10-CM | POA: Diagnosis not present

## 2016-06-03 DIAGNOSIS — I10 Essential (primary) hypertension: Secondary | ICD-10-CM | POA: Diagnosis not present

## 2016-06-03 DIAGNOSIS — N183 Chronic kidney disease, stage 3 (moderate): Secondary | ICD-10-CM | POA: Diagnosis not present

## 2016-06-26 ENCOUNTER — Other Ambulatory Visit: Payer: Self-pay

## 2016-07-10 DIAGNOSIS — G4700 Insomnia, unspecified: Secondary | ICD-10-CM | POA: Diagnosis not present

## 2016-07-10 DIAGNOSIS — Z23 Encounter for immunization: Secondary | ICD-10-CM | POA: Diagnosis not present

## 2016-07-10 DIAGNOSIS — M545 Low back pain: Secondary | ICD-10-CM | POA: Diagnosis not present

## 2016-07-10 DIAGNOSIS — N189 Chronic kidney disease, unspecified: Secondary | ICD-10-CM | POA: Diagnosis not present

## 2016-07-10 DIAGNOSIS — G2581 Restless legs syndrome: Secondary | ICD-10-CM | POA: Diagnosis not present

## 2016-07-15 ENCOUNTER — Other Ambulatory Visit: Payer: Self-pay | Admitting: Pulmonary Disease

## 2016-07-15 DIAGNOSIS — M545 Low back pain, unspecified: Secondary | ICD-10-CM

## 2016-07-15 DIAGNOSIS — G8929 Other chronic pain: Secondary | ICD-10-CM

## 2016-07-29 ENCOUNTER — Ambulatory Visit
Admission: RE | Admit: 2016-07-29 | Discharge: 2016-07-29 | Disposition: A | Discharge status: Home / Self Care | Payer: Medicare Other | Source: Ambulatory Visit | Attending: Pulmonary Disease | Admitting: Pulmonary Disease

## 2016-07-29 DIAGNOSIS — M48061 Spinal stenosis, lumbar region without neurogenic claudication: Secondary | ICD-10-CM | POA: Diagnosis not present

## 2016-07-29 DIAGNOSIS — M545 Low back pain: Principal | ICD-10-CM

## 2016-07-29 DIAGNOSIS — G8929 Other chronic pain: Secondary | ICD-10-CM

## 2016-07-29 MED ORDER — METHYLPREDNISOLONE ACETATE 40 MG/ML INJ SUSP (RADIOLOG
120.0000 mg | Freq: Once | INTRAMUSCULAR | Status: AC
  Administered 2016-07-29: 120 mg via EPIDURAL

## 2016-07-29 MED ORDER — IOPAMIDOL (ISOVUE-M 200) INJECTION 41%
1.0000 mL | Freq: Once | INTRAMUSCULAR | Status: AC
  Administered 2016-07-29: 1 mL via EPIDURAL

## 2016-08-10 ENCOUNTER — Other Ambulatory Visit: Payer: Self-pay | Admitting: Pulmonary Disease

## 2016-08-10 DIAGNOSIS — M25562 Pain in left knee: Secondary | ICD-10-CM

## 2016-08-13 ENCOUNTER — Other Ambulatory Visit: Payer: Self-pay | Admitting: Pulmonary Disease

## 2016-08-13 ENCOUNTER — Ambulatory Visit
Admission: RE | Admit: 2016-08-13 | Discharge: 2016-08-13 | Disposition: A | Discharge status: Home / Self Care | Payer: Medicare Other | Source: Ambulatory Visit | Attending: Pulmonary Disease | Admitting: Pulmonary Disease

## 2016-08-13 DIAGNOSIS — G8929 Other chronic pain: Secondary | ICD-10-CM

## 2016-08-13 DIAGNOSIS — M47817 Spondylosis without myelopathy or radiculopathy, lumbosacral region: Secondary | ICD-10-CM | POA: Diagnosis not present

## 2016-08-13 DIAGNOSIS — M25562 Pain in left knee: Secondary | ICD-10-CM

## 2016-08-13 DIAGNOSIS — M545 Low back pain, unspecified: Secondary | ICD-10-CM

## 2016-08-13 MED ORDER — IOPAMIDOL (ISOVUE-M 200) INJECTION 41%
1.0000 mL | Freq: Once | INTRAMUSCULAR | Status: AC
  Administered 2016-08-13: 1 mL via EPIDURAL

## 2016-08-13 MED ORDER — METHYLPREDNISOLONE ACETATE 40 MG/ML INJ SUSP (RADIOLOG
120.0000 mg | Freq: Once | INTRAMUSCULAR | Status: AC
  Administered 2016-08-13: 120 mg via EPIDURAL

## 2016-08-13 MED ORDER — IOPAMIDOL (ISOVUE-M 200) INJECTION 41%: 1.0000 mL | Freq: Once | INTRAMUSCULAR | Status: DC

## 2016-08-13 MED ORDER — METHYLPREDNISOLONE ACETATE 40 MG/ML INJ SUSP (RADIOLOG: 120.0000 mg | Freq: Once | INTRAMUSCULAR | Status: DC

## 2016-08-17 ENCOUNTER — Ambulatory Visit: Payer: Medicare Other | Admitting: Orthopedic Surgery

## 2016-08-21 DIAGNOSIS — J209 Acute bronchitis, unspecified: Secondary | ICD-10-CM | POA: Diagnosis not present

## 2016-08-21 DIAGNOSIS — G4733 Obstructive sleep apnea (adult) (pediatric): Secondary | ICD-10-CM | POA: Diagnosis not present

## 2016-09-08 ENCOUNTER — Encounter: Payer: Self-pay | Admitting: Orthopedic Surgery

## 2016-09-08 ENCOUNTER — Ambulatory Visit (INDEPENDENT_AMBULATORY_CARE_PROVIDER_SITE_OTHER): Payer: Medicare Other | Admitting: Orthopedic Surgery

## 2016-09-08 VITALS — BP 112/83 | HR 84 | Ht 69.0 in | Wt 227.0 lb

## 2016-09-08 DIAGNOSIS — M5410 Radiculopathy, site unspecified: Secondary | ICD-10-CM

## 2016-09-08 NOTE — Progress Notes (Signed)
Patient ID: [name], male   DOB: 1951/02/16, 65 y.o.   MRN: [id number]  Chief Complaint  Patient presents with  . Knee Pain    left    HPI [name] is a 65 y.o. male.  Presents for reevaluation of ongoing left knee pain.  This 65 year old male was worked up for his left knee including MRI which was found to be normal. We eventually diagnosed with disc disease in his lower back. He went for epidural injections on several occasions with good relief of pain in his left knee and leg.  However he's had 2 recent injections and he did not get good relief of pain so he came in to have his left knee checked again.  He denies any trauma  No mechanical symptoms  No swelling erythema warmth or loss of motion  Review of Systems Review of Systems Currently no lower back pain, the left leg is not hurting at present  Normal bowel and bladder function   Past Medical History:  Diagnosis Date  . Arthritis    spinal stenosis  . GERD (gastroesophageal reflux disease)   . Hyperlipidemia   . Hypertension   . Kidney function abnormal   . 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 Sleep apnea    uses Cpap    Past Surgical History:  Procedure Laterality Date  . CATARACT EXTRACTION     10-15 years ago  . COLONOSCOPY  05/25/2012   Procedure: COLONOSCOPY;  Surgeon: Johnny Houston, MD;  [facility];  Service: Endoscopy;  Laterality: N/A;  1030  . CYSTOSCOPY/RETROGRADE/URETEROSCOPY Right 06/07/2014   Procedure: CYSTOSCOPY, RIGHT RETROGRADE, RIGHT URETEROSCOPY, RIGHT URETERAL BALLOON DILATATION, BIOPSY, RIGHT URETERAL STENT;  Surgeon: Johnny Loa, MD;  Location: [facility];  Service: Urology;  Laterality: Right;  . kidney stone  1983  . left foot surgery  2010    Social History Social History  Substance Use Topics  . Smoking status: Never Smoker  . Smokeless tobacco: Not on file  . Alcohol use No     Comment: rare    No Known  Allergies  Current Meds  Medication Sig  . amitriptyline (ELAVIL) 25 MG tablet Take 25 mg by mouth at bedtime.  . clonazePAM (KLONOPIN) 0.5 MG tablet Take 0.5 mg by mouth daily.  . hydrochlorothiazide (MICROZIDE) 12.5 MG capsule Take 12.5 mg by mouth daily.  . Multiple Vitamins-Minerals (MULTIVITAMIN WITH MINERALS) tablet Take 1 tablet by mouth daily.  . traMADol (ULTRAM) 50 MG tablet Take 50 mg by mouth 3 (three) times daily.  Marland Kitchen zolpidem (AMBIEN) 10 MG tablet Take 10 mg by mouth at bedtime.      Physical Exam Physical Exam BP 112/83   Pulse 84   Ht 5\' 9"  (1.753 m)   Wt 227 lb (103 kg)   BMI 33.52 kg/m   Gen. appearance. The patient is well-developed and well-nourished, grooming and hygiene are normal. There are no gross congenital abnormalities  The patient is alert and oriented to person place and time  Mood and affect are normal  Ambulation remains normal  Examination reveals the following: On inspection we find nontender back left and right hip, no tenderness left knee  With the range of motion of  left knee remains normal  Stability tests were normal  , meniscal signs were negative  Strength tests revealed grade 5 motor strength  Skin we find no rash ulceration or erythema  Sensation  remains intact  Impression vascular system shows no peripheral edema  Data Reviewed Prior MRI reveals significant lumbar disc disease  Assessment    Disc disease    Plan    Repeat MRI last MRI was 2 years ago. MRI needs to be done to reimage the spine. Epidural injections       [name] 09/08/2016, 4:35 PM

## 2016-09-16 ENCOUNTER — Ambulatory Visit (HOSPITAL_COMMUNITY)
Admission: RE | Admit: 2016-09-16 | Discharge: 2016-09-16 | Disposition: A | Discharge status: Home / Self Care | Payer: Medicare Other | Source: Ambulatory Visit | Attending: Orthopedic Surgery | Admitting: Orthopedic Surgery

## 2016-09-16 DIAGNOSIS — M8938 Hypertrophy of bone, other site: Secondary | ICD-10-CM | POA: Insufficient documentation

## 2016-09-16 DIAGNOSIS — M7138 Other bursal cyst, other site: Secondary | ICD-10-CM | POA: Insufficient documentation

## 2016-09-16 DIAGNOSIS — M48061 Spinal stenosis, lumbar region without neurogenic claudication: Secondary | ICD-10-CM | POA: Insufficient documentation

## 2016-09-16 DIAGNOSIS — M5410 Radiculopathy, site unspecified: Secondary | ICD-10-CM | POA: Insufficient documentation

## 2016-09-16 DIAGNOSIS — M50121 Cervical disc disorder at C4-C5 level with radiculopathy: Secondary | ICD-10-CM | POA: Diagnosis not present

## 2016-09-21 ENCOUNTER — Ambulatory Visit (INDEPENDENT_AMBULATORY_CARE_PROVIDER_SITE_OTHER): Payer: Medicare Other | Admitting: Orthopedic Surgery

## 2016-09-21 DIAGNOSIS — M48062 Spinal stenosis, lumbar region with neurogenic claudication: Secondary | ICD-10-CM | POA: Diagnosis not present

## 2016-09-21 NOTE — Patient Instructions (Signed)
Call if you feel you need esi

## 2016-09-21 NOTE — Progress Notes (Signed)
Patient ID: [name], male   DOB: [birth year], 65 y.o.   MRN: [id number]  Chief Complaint  Patient presents with  . Follow-up    MRI results of L-spine.    HPI [name] is a 65 y.o. male.   HPI  MRI follow-up  Patient having increased pain in his lower back and radiating into his legs  Since we got the MRI his pain has resolved. We think it may have been brought on by the trip that he took 3 had to do a lot of sitting. He did get 2 epidural injections they did not help and hence we got the new MRI which does show increased spinal disease  Review of Systems Review of Systems  His bowel and bladder function remain normal Physical Exam There were no vitals taken for this visit.   Physical Exam  Encounter Diagnosis  Name Primary?  . Spinal stenosis of lumbar region with neurogenic claudication Yes   The MRI report was read as follows Moderate multifactorial spinal stenosis at L4-5. Although there is a small synovial cyst projecting medially from the right facet joint, no asymmetric lateral recess or foraminal stenosis is apparent to account for the right radicular symptoms. Both lateral recesses and foramina are mildly narrowed. 3. Mild multifactorial spinal and right foraminal stenosis at L5-S1.  We discussed this further. He is asymptomatic at present. Therefore he will call us if he needs to have epidural injections done based on his symptoms

## 2016-10-30 DIAGNOSIS — D509 Iron deficiency anemia, unspecified: Secondary | ICD-10-CM | POA: Diagnosis not present

## 2016-10-30 DIAGNOSIS — N183 Chronic kidney disease, stage 3 (moderate): Secondary | ICD-10-CM | POA: Diagnosis not present

## 2016-10-30 DIAGNOSIS — E559 Vitamin D deficiency, unspecified: Secondary | ICD-10-CM | POA: Diagnosis not present

## 2016-10-30 DIAGNOSIS — R809 Proteinuria, unspecified: Secondary | ICD-10-CM | POA: Diagnosis not present

## 2016-10-30 DIAGNOSIS — I10 Essential (primary) hypertension: Secondary | ICD-10-CM | POA: Diagnosis not present

## 2016-10-30 DIAGNOSIS — Z79899 Other long term (current) drug therapy: Secondary | ICD-10-CM | POA: Diagnosis not present

## 2016-11-04 DIAGNOSIS — N250 Renal osteodystrophy: Secondary | ICD-10-CM | POA: Diagnosis not present

## 2016-11-04 DIAGNOSIS — N183 Chronic kidney disease, stage 3 (moderate): Secondary | ICD-10-CM | POA: Diagnosis not present

## 2016-11-04 DIAGNOSIS — E870 Hyperosmolality and hypernatremia: Secondary | ICD-10-CM | POA: Diagnosis not present

## 2016-11-04 DIAGNOSIS — I10 Essential (primary) hypertension: Secondary | ICD-10-CM | POA: Diagnosis not present

## 2016-11-05 ENCOUNTER — Other Ambulatory Visit (HOSPITAL_COMMUNITY): Payer: Self-pay | Admitting: Pulmonary Disease

## 2016-11-05 DIAGNOSIS — G4700 Insomnia, unspecified: Secondary | ICD-10-CM | POA: Diagnosis not present

## 2016-11-05 DIAGNOSIS — G2581 Restless legs syndrome: Secondary | ICD-10-CM | POA: Diagnosis not present

## 2016-11-05 DIAGNOSIS — R5383 Other fatigue: Secondary | ICD-10-CM

## 2016-11-05 DIAGNOSIS — G4733 Obstructive sleep apnea (adult) (pediatric): Secondary | ICD-10-CM | POA: Diagnosis not present

## 2016-11-05 DIAGNOSIS — K210 Gastro-esophageal reflux disease with esophagitis: Secondary | ICD-10-CM | POA: Diagnosis not present

## 2016-11-05 DIAGNOSIS — M6281 Muscle weakness (generalized): Secondary | ICD-10-CM

## 2016-11-10 ENCOUNTER — Ambulatory Visit (HOSPITAL_COMMUNITY)
Admission: RE | Admit: 2016-11-10 | Discharge: 2016-11-10 | Disposition: A | Discharge status: Home / Self Care | Payer: Medicare Other | Source: Ambulatory Visit | Attending: Pulmonary Disease | Admitting: Pulmonary Disease

## 2016-11-10 DIAGNOSIS — I10 Essential (primary) hypertension: Secondary | ICD-10-CM | POA: Diagnosis not present

## 2016-11-10 DIAGNOSIS — M791 Myalgia: Secondary | ICD-10-CM | POA: Diagnosis not present

## 2016-11-10 DIAGNOSIS — Z87442 Personal history of urinary calculi: Secondary | ICD-10-CM | POA: Diagnosis not present

## 2016-11-10 DIAGNOSIS — N189 Chronic kidney disease, unspecified: Secondary | ICD-10-CM | POA: Diagnosis not present

## 2016-11-10 DIAGNOSIS — R531 Weakness: Secondary | ICD-10-CM | POA: Diagnosis not present

## 2016-11-10 DIAGNOSIS — R5383 Other fatigue: Secondary | ICD-10-CM

## 2016-11-10 DIAGNOSIS — Z8739 Personal history of other diseases of the musculoskeletal system and connective tissue: Secondary | ICD-10-CM | POA: Diagnosis not present

## 2016-11-10 DIAGNOSIS — G4700 Insomnia, unspecified: Secondary | ICD-10-CM | POA: Diagnosis not present

## 2016-11-10 DIAGNOSIS — M6281 Muscle weakness (generalized): Secondary | ICD-10-CM

## 2016-11-10 DIAGNOSIS — K210 Gastro-esophageal reflux disease with esophagitis: Secondary | ICD-10-CM | POA: Diagnosis not present

## 2016-11-10 DIAGNOSIS — G4730 Sleep apnea, unspecified: Secondary | ICD-10-CM | POA: Diagnosis not present

## 2016-11-10 DIAGNOSIS — G2581 Restless legs syndrome: Secondary | ICD-10-CM | POA: Insufficient documentation

## 2016-12-07 ENCOUNTER — Encounter: Payer: Self-pay | Admitting: Neurology

## 2016-12-07 ENCOUNTER — Ambulatory Visit (INDEPENDENT_AMBULATORY_CARE_PROVIDER_SITE_OTHER): Payer: Medicare Other | Admitting: Neurology

## 2016-12-07 VITALS — BP 129/82 | HR 71 | Resp 16 | Ht 69.0 in | Wt 225.5 lb

## 2016-12-07 DIAGNOSIS — E611 Iron deficiency: Secondary | ICD-10-CM | POA: Diagnosis not present

## 2016-12-07 DIAGNOSIS — M4802 Spinal stenosis, cervical region: Secondary | ICD-10-CM | POA: Diagnosis not present

## 2016-12-07 DIAGNOSIS — G4733 Obstructive sleep apnea (adult) (pediatric): Secondary | ICD-10-CM

## 2016-12-07 DIAGNOSIS — M48062 Spinal stenosis, lumbar region with neurogenic claudication: Secondary | ICD-10-CM | POA: Diagnosis not present

## 2016-12-07 DIAGNOSIS — Z9989 Dependence on other enabling machines and devices: Secondary | ICD-10-CM | POA: Diagnosis not present

## 2016-12-07 DIAGNOSIS — G629 Polyneuropathy, unspecified: Secondary | ICD-10-CM

## 2016-12-07 DIAGNOSIS — R200 Anesthesia of skin: Secondary | ICD-10-CM

## 2016-12-07 DIAGNOSIS — G2581 Restless legs syndrome: Secondary | ICD-10-CM

## 2016-12-07 MED ORDER — GABAPENTIN 600 MG PO TABS: ORAL_TABLET | ORAL | 5 refills | Status: DC

## 2016-12-07 NOTE — Progress Notes (Signed)
[facility]  PATIENT: [name] DOB: [birth year]  REFERRING DOCTOR OR PCP:  Sinda Du SOURCE: patient, notes from Dr. Luan Pulling, imaging reports, MRI images on PACS  _________________________________   HISTORICAL  CHIEF COMPLAINT:  Chief Complaint  Patient presents with  . Restless Leg Syndrome    [name] is here for eval of RLS.  Sts. he has been seeing his pcp for same,   Sts. he doesn't believe Gabapentin 34m qd helped.  He was not able to tolerate Requip (nightmares, excessive daytime drowsiness.)  Sts. he is tolerating Mirapex 0.239mwell, but not sure it is helping.  Hx. of OSA--sts is compliant with CPAP--pcp manages/fim    HISTORY OF PRESENT ILLNESS:  I had the pleasure seeing you patient, [name] for neurologic consultation regarding his restless leg syndrome.  For many years, he noted mild RLS a night a week on average which was mild   After joint surgery on the the right hand, he felt symptoms worsened.  He did note he was less active a few weeks.   The RLS increased to several days a week and intensified.    He is unable to correlate the RLS with any activity or food.   He can go several days without it and once went 18 days in a row with the leg symptoms.   He feels an itching sensation and crawly sensation on his skin that improves with walking or moving around.  More recently, he has had symptoms in his arms also.   He is on amitriptyline but has been on x 20 years.    He tried Requip but it was poorly tolerated (bad dreams and drowsiness during the day).  Mirapex 0.25 mg may be helping some.  Gabapentin in the past (was on for knee pain) was tolerated but he doesn't recall its effect on RLS (was mild at that time).    He notes mild numbness in his feet.  He moves his legs a lot at night and he has OSA.   He is on CPAP and is very compliant.   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   Once asleep he  sleeps well.      He reports that he had some blood work back in January and was told that the rheumatoid factor was elevated and that he will be referred to rheumatology.  I don't have other lab tests results.  MRI of the lumbar spine 09/16/2016 shows severe spinal stenosis at L4-L5 at that level. There is facet and ligamentum flavum hypertrophy and congenitally short pedicles. There is also a left lateral disc bulge. At L5-S1 there is facet hypertrophy and a right lateral disc bulge that does not compress any nerve roots.    MRI of the brain 11/10/2016 was normal.   I also reviewed an MRI of the cervical spine from 2012. He has borderline to mild spinal stenosis at a couple levels in the mid cervical spine and multilevel narrowing though there is no definite nerve root compression.  The worse foraminal narrowing is to the left at C7-T1 that could affect the C8 nerve root.  REVIEW OF SYSTEMS: Constitutional: No fevers, chills, sweats, or change in appetite Eyes: No visual changes, double vision, eye pain Ear, nose and throat: No hearing loss, ear pain, nasal congestion, sore throat Cardiovascular: No chest pain, palpitations Respiratory: No shortness of breath at rest or with exertion.   No wheezes.   He has  OSA on CPAP GastrointestinaI: No nausea, vomiting, diarrhea, abdominal pain, fecal incontinence Genitourinary: No dysuria, urinary retention or frequency.  No nocturia. Musculoskeletal: Reports mild neck pain, back pain Integumentary: No rash, pruritus, skin lesions Neurological: as above Psychiatric: No depression at this time.  No anxiety Endocrine: No palpitations, diaphoresis, change in appetite, change in weigh or increased thirst Hematologic/Lymphatic: No anemia, purpura, petechiae. Allergic/Immunologic: No itchy/runny eyes, nasal congestion, recent allergic reactions, rashes  ALLERGIES: No Known Allergies  HOME MEDICATIONS:  Current Outpatient Prescriptions:  .  traMADol  (ULTRAM) 50 MG tablet, Take 50 mg by mouth 3 (three) times daily., Disp: , Rfl:  .  zolpidem (AMBIEN) 10 MG tablet, Take 10 mg by mouth at bedtime., Disp: , Rfl:  .  aspirin 81 MG chewable tablet, Chew 81 mg by mouth., Disp: , Rfl:  .  gabapentin (NEURONTIN) 600 MG tablet, 1/2 to 1 po nightly, Disp: 30 tablet, Rfl: 5 .  hydrochlorothiazide (MICROZIDE) 12.5 MG capsule, Take 12.5 mg by mouth daily., Disp: , Rfl:  .  Multiple Vitamins-Minerals (MULTIVITAMIN WITH MINERALS) tablet, Take 1 tablet by mouth daily., Disp: , Rfl:  .  pramipexole (MIRAPEX) 0.25 MG tablet, , Disp: , Rfl:   PAST MEDICAL HISTORY: Past Medical History:  Diagnosis Date  . Arthritis    spinal stenosis  . GERD (gastroesophageal reflux disease)   . Hyperlipidemia   . Hypertension   . Kidney function abnormal   . 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 Sleep apnea    uses Cpap    PAST SURGICAL HISTORY: Past Surgical History:  Procedure Laterality Date  . CATARACT EXTRACTION     10-15 years ago  . COLONOSCOPY  05/25/2012   Procedure: COLONOSCOPY;  Surgeon: Rogene Houston, MD;  [facility];  Service: Endoscopy;  Laterality: N/A;  1030  . CYSTOSCOPY/RETROGRADE/URETEROSCOPY Right 06/07/2014   Procedure: CYSTOSCOPY, RIGHT RETROGRADE, RIGHT URETEROSCOPY, RIGHT URETERAL BALLOON DILATATION, BIOPSY, RIGHT URETERAL STENT;  Surgeon: Jorja Loa, MD;  Location: [facility];  Service: Urology;  Laterality: Right;  . kidney stone  1983  . left foot surgery  2010    FAMILY HISTORY: No family history on file.  SOCIAL HISTORY:  Social History   Social History  . Marital status: Married    Spouse name: N/A  . Number of children: N/A  . Years of education: N/A   Occupational History  . Not on file.   Social History Main Topics  . Smoking status: Current Some Day Smoker    Types: Cigars  . Smokeless tobacco: Never Used  . Alcohol use No     Comment: rare  . Drug use: No  .  Sexual activity: Not on file   Other Topics Concern  . Not on file   Social History Narrative  . No narrative on file     PHYSICAL EXAM  Vitals:   12/07/16 0852  BP: 129/82  Pulse: 71  Resp: 16  Weight: 225 lb 8 oz (102.3 kg)  Height: 5' 9" (1.753 m)    Body mass index is 33.3 kg/m.   General: The patient is well-developed and well-nourished and in no acute distress  Eyes:  Funduscopic exam shows normal optic discs and retinal vessels.  Neck: The neck is supple, no carotid bruits are noted.  The neck is nontender.  Cardiovascular: The heart has a regular rate and rhythm with a normal S1 and S2. There were no murmurs, gallops  or rubs. Lungs are clear to auscultation.  Skin: Extremities are without significant edema.  Musculoskeletal:  Back is nontender  Neurologic Exam  Mental status: The patient is alert and oriented x 3 at the time of the examination. The patient has apparent normal recent and remote memory, with an apparently normal attention span and concentration ability.   Speech is normal.  Cranial nerves: Extraocular movements are full. Pupils are equal, round, and reactive to light and accomodation.  Visual fields are full.  Facial symmetry is present. There is good facial sensation to soft touch bilaterally.Facial strength is normal.  Trapezius and sternocleidomastoid strength is normal. No dysarthria is noted.  The tongue is midline, and the patient has symmetric elevation of the soft palate. No obvious hearing deficits are noted.  Motor:  Muscle bulk is normal.   Tone is normal. Strength is  5 / 5 in all 4 extremities.   Sensory: Sensory testing is intact to pinprick, soft touch and vibration sensation in the arms but reduced vibration sensation in his toes.  Coordination: Cerebellar testing reveals good finger-nose-finger and heel-to-shin bilaterally.  Gait and station: Station is normal.   Gait is normal. Tandem gait is normal. Romberg is negative.    Reflexes: Deep tendon reflexes are symmetric and normal bilaterally.   Plantar responses are flexor.    DIAGNOSTIC DATA (LABS, IMAGING, TESTING) - I reviewed patient records, labs, notes, testing and imaging myself where available.  Lab Results  Component Value Date   HGB 12.9 (L) 06/07/2014   HCT 38.0 (L) 06/07/2014      Component Value Date/Time   NA 142 06/07/2014 0907   K 3.7 06/07/2014 0907   GLUCOSE 107 (H) 06/07/2014 0907       ASSESSMENT AND PLAN   Restless leg syndrome - Plan: Ferritin  Numbness - Plan: Multiple Myeloma Panel (SPEP&IFE w/QIG), Cryoglobulin, Vitamin B12  Spinal stenosis of lumbar region with neurogenic claudication  Low iron - Plan: Ferritin  Neuropathy ([place]) - Plan: Vitamin B12  OSA on CPAP  Spinal stenosis of cervical region   In summary, Mr. Sanguinetti is a 66 year old man with restless leg syndrome who also has numbness in his feet. He will take gabapentin at night to help with the restless legs and he will continue low dose Mirapex for now.   Additionally I will have him stop the amitriptyline at night since it may make restless legs worse.    I will check a ferritin to see if he has low iron which could be contributing to his restless leg syndrome. We will check neuropathy labs as he has numbness in his feet.   If the evaluation is negative and symptoms do not improve, consider an MRI of the cervical spine to determine if the spinal stenosis has worsened which could be contributing.    He will return to see me in 2-3 months but call sooner if he has any new or worsening neurologic symptoms.  Thank you for asking me to see Mr. Manes. Please let me know if I can be of further assistance with him or other patients in the future.   Richard A. Felecia Shelling, MD, PhD 06/03/9832, 8:25 PM Certified in Neurology, Clinical Neurophysiology, Sleep Medicine, Pain Medicine and Neuroimaging  [facility] [address], [place], [place] [address] [phone]

## 2016-12-08 ENCOUNTER — Telehealth: Payer: Self-pay | Admitting: Neurology

## 2016-12-08 NOTE — Telephone Encounter (Signed)
Pt called said he wants to know if Dr Felecia Shelling wants him to d/c pramipexole (MIRAPEX) 0.25 MG tablet since he has started gabapentin. Please call and can LVM if he not at home.

## 2016-12-08 NOTE — Telephone Encounter (Signed)
I have spoken with [name] this afternoon and explained, per RAS ov note, that he may continue Mirapex.  He verbalized understanding of same/fim

## 2016-12-11 LAB — CRYOGLOBULIN

## 2016-12-11 LAB — MULTIPLE MYELOMA PANEL, SERUM
ALPHA2 GLOB SERPL ELPH-MCNC: 0.7 g/dL (ref 0.4–1.0)
Albumin SerPl Elph-Mcnc: 3.9 g/dL (ref 2.9–4.4)
Albumin/Glob SerPl: 1.4 (ref 0.7–1.7)
Alpha 1: 0.3 g/dL (ref 0.0–0.4)
B-Globulin SerPl Elph-Mcnc: 1.3 g/dL (ref 0.7–1.3)
Gamma Glob SerPl Elph-Mcnc: 0.7 g/dL (ref 0.4–1.8)
Globulin, Total: 2.9 g/dL (ref 2.2–3.9)
IGA/IMMUNOGLOBULIN A, SERUM: 181 mg/dL (ref 61–437)
IGG (IMMUNOGLOBIN G), SERUM: 714 mg/dL (ref 700–1600)
IgM (Immunoglobulin M), Srm: 78 mg/dL (ref 20–172)
TOTAL PROTEIN: 6.8 g/dL (ref 6.0–8.5)

## 2016-12-11 LAB — FERRITIN: Ferritin: 51 ng/mL (ref 30–400)

## 2016-12-11 LAB — VITAMIN B12: VITAMIN B 12: 720 pg/mL (ref 232–1245)

## 2016-12-17 ENCOUNTER — Telehealth: Payer: Self-pay | Admitting: *Deleted

## 2016-12-17 DIAGNOSIS — K210 Gastro-esophageal reflux disease with esophagitis: Secondary | ICD-10-CM | POA: Diagnosis not present

## 2016-12-17 DIAGNOSIS — G4700 Insomnia, unspecified: Secondary | ICD-10-CM | POA: Diagnosis not present

## 2016-12-17 DIAGNOSIS — G4733 Obstructive sleep apnea (adult) (pediatric): Secondary | ICD-10-CM | POA: Diagnosis not present

## 2016-12-17 DIAGNOSIS — G2581 Restless legs syndrome: Secondary | ICD-10-CM | POA: Diagnosis not present

## 2016-12-17 NOTE — Telephone Encounter (Signed)
-----   Message from Britt Bottom, MD sent at 12/12/2016  4:01 PM EST ----- Please let him know that the lab work is normal. If he is not any better with the medications, I would like to check an MRI of the cervical spine.

## 2016-12-17 NOTE — Telephone Encounter (Signed)
I have spoken with wife Joaquim Lai (on hippa) and per RAS, reveiwed lab results as below.  She will discuss with pt. and have him call back if sx. no better/fim

## 2016-12-23 DIAGNOSIS — M19041 Primary osteoarthritis, right hand: Secondary | ICD-10-CM | POA: Diagnosis not present

## 2016-12-23 DIAGNOSIS — M1990 Unspecified osteoarthritis, unspecified site: Secondary | ICD-10-CM | POA: Diagnosis not present

## 2016-12-23 DIAGNOSIS — M79643 Pain in unspecified hand: Secondary | ICD-10-CM | POA: Diagnosis not present

## 2016-12-23 DIAGNOSIS — M19042 Primary osteoarthritis, left hand: Secondary | ICD-10-CM | POA: Diagnosis not present

## 2016-12-23 DIAGNOSIS — R768 Other specified abnormal immunological findings in serum: Secondary | ICD-10-CM | POA: Diagnosis not present

## 2016-12-23 DIAGNOSIS — G2581 Restless legs syndrome: Secondary | ICD-10-CM | POA: Diagnosis not present

## 2017-01-04 DIAGNOSIS — N189 Chronic kidney disease, unspecified: Secondary | ICD-10-CM | POA: Diagnosis not present

## 2017-01-04 DIAGNOSIS — G4700 Insomnia, unspecified: Secondary | ICD-10-CM | POA: Diagnosis not present

## 2017-01-04 DIAGNOSIS — Z87442 Personal history of urinary calculi: Secondary | ICD-10-CM | POA: Diagnosis not present

## 2017-01-04 DIAGNOSIS — M791 Myalgia: Secondary | ICD-10-CM | POA: Diagnosis not present

## 2017-01-04 DIAGNOSIS — G2581 Restless legs syndrome: Secondary | ICD-10-CM | POA: Diagnosis not present

## 2017-01-04 DIAGNOSIS — Z125 Encounter for screening for malignant neoplasm of prostate: Secondary | ICD-10-CM | POA: Diagnosis not present

## 2017-01-04 DIAGNOSIS — K210 Gastro-esophageal reflux disease with esophagitis: Secondary | ICD-10-CM | POA: Diagnosis not present

## 2017-01-04 DIAGNOSIS — G4730 Sleep apnea, unspecified: Secondary | ICD-10-CM | POA: Diagnosis not present

## 2017-01-04 DIAGNOSIS — Z8739 Personal history of other diseases of the musculoskeletal system and connective tissue: Secondary | ICD-10-CM | POA: Diagnosis not present

## 2017-01-04 DIAGNOSIS — Z0000 Encounter for general adult medical examination without abnormal findings: Secondary | ICD-10-CM | POA: Diagnosis not present

## 2017-01-04 DIAGNOSIS — M6281 Muscle weakness (generalized): Secondary | ICD-10-CM | POA: Diagnosis not present

## 2017-01-13 DIAGNOSIS — Z1211 Encounter for screening for malignant neoplasm of colon: Secondary | ICD-10-CM | POA: Diagnosis not present

## 2017-01-13 DIAGNOSIS — Z23 Encounter for immunization: Secondary | ICD-10-CM | POA: Diagnosis not present

## 2017-01-13 DIAGNOSIS — Z0000 Encounter for general adult medical examination without abnormal findings: Secondary | ICD-10-CM | POA: Diagnosis not present

## 2017-02-23 DIAGNOSIS — D509 Iron deficiency anemia, unspecified: Secondary | ICD-10-CM | POA: Diagnosis not present

## 2017-02-23 DIAGNOSIS — I10 Essential (primary) hypertension: Secondary | ICD-10-CM | POA: Diagnosis not present

## 2017-02-23 DIAGNOSIS — R809 Proteinuria, unspecified: Secondary | ICD-10-CM | POA: Diagnosis not present

## 2017-02-23 DIAGNOSIS — Z79899 Other long term (current) drug therapy: Secondary | ICD-10-CM | POA: Diagnosis not present

## 2017-02-23 DIAGNOSIS — E559 Vitamin D deficiency, unspecified: Secondary | ICD-10-CM | POA: Diagnosis not present

## 2017-02-23 DIAGNOSIS — N183 Chronic kidney disease, stage 3 (moderate): Secondary | ICD-10-CM | POA: Diagnosis not present

## 2017-03-03 DIAGNOSIS — E871 Hypo-osmolality and hyponatremia: Secondary | ICD-10-CM | POA: Diagnosis not present

## 2017-03-03 DIAGNOSIS — N183 Chronic kidney disease, stage 3 (moderate): Secondary | ICD-10-CM | POA: Diagnosis not present

## 2017-03-03 DIAGNOSIS — E669 Obesity, unspecified: Secondary | ICD-10-CM | POA: Diagnosis not present

## 2017-03-03 DIAGNOSIS — D509 Iron deficiency anemia, unspecified: Secondary | ICD-10-CM | POA: Diagnosis not present

## 2017-03-08 ENCOUNTER — Ambulatory Visit (INDEPENDENT_AMBULATORY_CARE_PROVIDER_SITE_OTHER): Payer: Medicare Other | Admitting: Neurology

## 2017-03-08 ENCOUNTER — Encounter: Payer: Self-pay | Admitting: Neurology

## 2017-03-08 VITALS — BP 129/77 | HR 69 | Resp 8 | Ht 69.0 in | Wt 228.5 lb

## 2017-03-08 DIAGNOSIS — G4733 Obstructive sleep apnea (adult) (pediatric): Secondary | ICD-10-CM

## 2017-03-08 DIAGNOSIS — G2581 Restless legs syndrome: Secondary | ICD-10-CM

## 2017-03-08 DIAGNOSIS — Z9989 Dependence on other enabling machines and devices: Secondary | ICD-10-CM

## 2017-03-08 MED ORDER — PRAMIPEXOLE DIHYDROCHLORIDE 0.25 MG PO TABS: ORAL_TABLET | ORAL | 5 refills | Status: DC

## 2017-03-08 NOTE — Progress Notes (Signed)
[facility]  PATIENT: [name] DOB: 06/03/1951  REFERRING DOCTOR OR PCP:  Sinda Du SOURCE: patient, notes from Dr. Luan Pulling, imaging reports, MRI images on PACS  _________________________________   HISTORICAL  CHIEF COMPLAINT:  Chief Complaint  Patient presents with  . RLS    Sts. RLS is about the same.  Couldn't tolerate being off of HS dose of Amitriptyline, so he restarted it./fim    HISTORY OF PRESENT ILLNESS:  [name] is a 66 yo man restless leg syndrome.  He has RLS that can be severe at times and is sometimes in his arms and it occurs for a week or so every month.    He was unable to stop amitriptyline as his sleep maintenance worsened tremendously (has been on x 20 years).    When present, he feels an itching sensation and crawly sensation on his skin that improves with walking or moving around.     Requip was poorly tolerated (bad dreams and drowsiness during the day).  Mirapex 0.25 mg is helping some.  Gabapentin 300 mg may be helping slightly.     He also has OSA.   He is on CPAP and is very compliant.     He has sleep onset and sleep maintenance insomnia.   Ambien greatly helped his sleep onset insomnia and amitriptyline hlps the sleep maintenance.  Now, once asleep he sleeps well.       MRI of the lumbar spine 09/16/2016 shows severe spinal stenosis at L4-L5 at that level. There is facet and ligamentum flavum hypertrophy and congenitally short pedicles. There is also a left lateral disc bulge. At L5-S1 there is facet hypertrophy and a right lateral disc bulge that does not compress any nerve roots.    MRI of the brain 11/10/2016 was normal.   I also reviewed an MRI of the cervical spine from 2012. He has borderline to mild spinal stenosis at a couple levels in the mid cervical spine and multilevel narrowing though there is no definite nerve root compression.  The worse foraminal narrowing is to the left at C7-T1 that could affect the C8  nerve root.  REVIEW OF SYSTEMS: Constitutional: No fevers, chills, sweats, or change in appetite Eyes: No visual changes, double vision, eye pain Ear, nose and throat: No hearing loss, ear pain, nasal congestion, sore throat Cardiovascular: No chest pain, palpitations Respiratory: No shortness of breath at rest or with exertion.   No wheezes.   He has OSA on CPAP GastrointestinaI: No nausea, vomiting, diarrhea, abdominal pain, fecal incontinence Genitourinary: No dysuria, urinary retention or frequency.  No nocturia. Musculoskeletal: Reports mild neck pain, back pain Integumentary: No rash, pruritus, skin lesions Neurological: as above Psychiatric: No depression at this time.  No anxiety Endocrine: No palpitations, diaphoresis, change in appetite, change in weigh or increased thirst Hematologic/Lymphatic: No anemia, purpura, petechiae. Allergic/Immunologic: No itchy/runny eyes, nasal congestion, recent allergic reactions, rashes  ALLERGIES: No Known Allergies  HOME MEDICATIONS:  Current Outpatient Prescriptions:  .  aspirin 81 MG chewable tablet, Chew 81 mg by mouth., Disp: , Rfl:  .  gabapentin (NEURONTIN) 600 MG tablet, 1/2 to 1 po nightly, Disp: 30 tablet, Rfl: 5 .  hydrochlorothiazide (MICROZIDE) 12.5 MG capsule, Take 12.5 mg by mouth daily., Disp: , Rfl:  .  Multiple Vitamins-Minerals (MULTIVITAMIN WITH MINERALS) tablet, Take 1 tablet by mouth daily., Disp: , Rfl:  .  pramipexole (MIRAPEX) 0.25 MG tablet, Take 2 to 3 pills po nightly, Disp: 90 tablet,  Rfl: 5 .  traMADol (ULTRAM) 50 MG tablet, Take 50 mg by mouth 3 (three) times daily., Disp: , Rfl:  .  zolpidem (AMBIEN) 10 MG tablet, Take 10 mg by mouth at bedtime., Disp: , Rfl:   PAST MEDICAL HISTORY: Past Medical History:  Diagnosis Date  . Arthritis    spinal stenosis  . GERD (gastroesophageal reflux disease)   . Hyperlipidemia   . Hypertension   . Kidney function abnormal   . Renal disorder    R kidney does not  function  . Sleep apnea    uses Cpap    PAST SURGICAL HISTORY: Past Surgical History:  Procedure Laterality Date  . CATARACT EXTRACTION     10-15 years ago  . COLONOSCOPY  05/25/2012   Procedure: COLONOSCOPY;  Surgeon: Rogene Houston, MD;  [facility];  Service: Endoscopy;  Laterality: N/A;  1030  . CYSTOSCOPY/RETROGRADE/URETEROSCOPY Right 06/07/2014   Procedure: CYSTOSCOPY, RIGHT RETROGRADE, RIGHT URETEROSCOPY, RIGHT URETERAL BALLOON DILATATION, BIOPSY, RIGHT URETERAL STENT;  Surgeon: Jorja Loa, MD;  Location: [facility];  Service: Urology;  Laterality: Right;  . kidney stone  1983  . left foot surgery  2010    FAMILY HISTORY: No family history on file.  SOCIAL HISTORY:  Social History   Social History  . Marital status: Married    Spouse name: N/A  . Number of children: N/A  . Years of education: N/A   Occupational History  . Not on file.   Social History Main Topics  . Smoking status: Current Some Day Smoker    Types: Cigars  . Smokeless tobacco: Never Used  . Alcohol use No     Comment: rare  . Drug use: No  . Sexual activity: Not on file   Other Topics Concern  . Not on file   Social History Narrative  . No narrative on file     PHYSICAL EXAM  Vitals:   03/08/17 1052  BP: 129/77  Pulse: 69  Resp: (!) 8  Weight: 228 lb 8 oz (103.6 kg)  Height: 5\' 9"  (1.753 m)    Body mass index is 33.74 kg/m.   General: The patient is well-developed and well-nourished and in no acute distress   Skin: Extremities are without rash or edema.   Neurologic Exam  Mental status: The patient is alert and oriented x 3 at the time of the examination. The patient has apparent normal recent and remote memory, with an apparently normal attention span and concentration ability.   Speech is normal.  Cranial nerves: Extraocular movements are full. Facial strength and sensation is normal. Trapezius and sternocleidomastoid strength is  normal. No dysarthria is noted.  The tongue is midline, and the patient has symmetric elevation of the soft palate. No obvious hearing deficits are noted.  Motor:  Muscle bulk is normal.   Tone is normal. Strength is  5 / 5 in all 4 extremities.   Sensory: Sensory testing is intact to pinprick, soft touch and vibration sensation in the arms but reduced vibration sensation in his toes (50% at toes 75% at ankles compared to knees)  Coordination: Cerebellar testing reveals good finger-nose-finger and heel-to-shin bilaterally.  Gait and station: Station is normal.   Gait is normal. Tandem gait is normal. Romberg is negative.   Reflexes: Deep tendon reflexes are symmetric and normal bilaterally.       DIAGNOSTIC DATA (LABS, IMAGING, TESTING) - I reviewed patient records, labs, notes, testing and imaging myself  where available.  Lab Results  Component Value Date   HGB 12.9 (L) 06/07/2014   HCT 38.0 (L) 06/07/2014      Component Value Date/Time   NA 142 06/07/2014 0907   K 3.7 06/07/2014 0907   GLUCOSE 107 (H) 06/07/2014 0907   PROT 6.8 12/07/2016 1026       ASSESSMENT AND PLAN   Restless leg syndrome  OSA on CPAP   1.   Increase Mirapex to 0.5 to 0.75 mg nightly.     For now, continue the gabapentin 300 mg nightly and the Ambien 10 mg nightly. If not better, we will increase the gabapentin to 600 mg (he has only one kidney and crt is 1.54 by his report so I would not go past 600 mg).    If that is not effective, consider switching the zolpidem to a benzodiazepine.  2.   His PCP is going to have him get an iron infusion for anemia. This might also help his restless legs. 3.   If symptoms do not improve with changes in medication, consider a nerve conduction study to better evaluate for the possibility of polyneuropathy (may also have radiculopathies from spinal stenosis)  He will return to see me in 6 months but call sooner if he has any new or worsening neurologic symptoms.     Laquesha Holcomb A. Felecia Shelling, MD, PhD 3/74/4514, 6:04 PM Certified in Neurology, Clinical Neurophysiology, Sleep Medicine, Pain Medicine and Neuroimaging  [facility] [address], [place], [place] [address] [phone]

## 2017-03-09 ENCOUNTER — Encounter (HOSPITAL_COMMUNITY)
Admission: RE | Admit: 2017-03-09 | Discharge: 2017-03-09 | Disposition: A | Discharge status: Home / Self Care | Payer: Medicare Other | Source: Ambulatory Visit | Attending: Nephrology | Admitting: Nephrology

## 2017-03-09 DIAGNOSIS — D509 Iron deficiency anemia, unspecified: Secondary | ICD-10-CM | POA: Diagnosis not present

## 2017-03-09 MED ORDER — SODIUM CHLORIDE 0.9 % IV SOLN
INTRAVENOUS | Status: DC
  Administered 2017-03-09: 250 mL via INTRAVENOUS

## 2017-03-09 MED ORDER — SODIUM CHLORIDE 0.9 % IV SOLN
510.0000 mg | Freq: Once | INTRAVENOUS | Status: AC
  Administered 2017-03-09: 510 mg via INTRAVENOUS
  Filled 2017-03-09: qty 17

## 2017-03-11 ENCOUNTER — Encounter (INDEPENDENT_AMBULATORY_CARE_PROVIDER_SITE_OTHER): Payer: Self-pay | Admitting: *Deleted

## 2017-03-16 ENCOUNTER — Encounter (HOSPITAL_COMMUNITY): Payer: Self-pay

## 2017-03-16 ENCOUNTER — Encounter (HOSPITAL_COMMUNITY)
Admission: RE | Admit: 2017-03-16 | Discharge: 2017-03-16 | Disposition: A | Discharge status: Home / Self Care | Payer: Medicare Other | Source: Ambulatory Visit | Attending: Nephrology | Admitting: Nephrology

## 2017-03-16 DIAGNOSIS — D509 Iron deficiency anemia, unspecified: Secondary | ICD-10-CM | POA: Diagnosis not present

## 2017-03-16 MED ORDER — SODIUM CHLORIDE 0.9 % IV SOLN
510.0000 mg | Freq: Once | INTRAVENOUS | Status: AC
  Administered 2017-03-16: 510 mg via INTRAVENOUS
  Filled 2017-03-16: qty 17

## 2017-03-16 MED ORDER — SODIUM CHLORIDE 0.9 % IV SOLN
INTRAVENOUS | Status: DC
  Administered 2017-03-16: 250 mL via INTRAVENOUS

## 2017-03-17 DIAGNOSIS — G2589 Other specified extrapyramidal and movement disorders: Secondary | ICD-10-CM | POA: Diagnosis not present

## 2017-03-17 DIAGNOSIS — N189 Chronic kidney disease, unspecified: Secondary | ICD-10-CM | POA: Diagnosis not present

## 2017-03-17 DIAGNOSIS — K210 Gastro-esophageal reflux disease with esophagitis: Secondary | ICD-10-CM | POA: Diagnosis not present

## 2017-03-17 DIAGNOSIS — G4730 Sleep apnea, unspecified: Secondary | ICD-10-CM | POA: Diagnosis not present

## 2017-04-05 ENCOUNTER — Encounter (INDEPENDENT_AMBULATORY_CARE_PROVIDER_SITE_OTHER): Payer: Self-pay

## 2017-04-05 ENCOUNTER — Other Ambulatory Visit (INDEPENDENT_AMBULATORY_CARE_PROVIDER_SITE_OTHER): Payer: Self-pay | Admitting: Internal Medicine

## 2017-04-05 ENCOUNTER — Telehealth (INDEPENDENT_AMBULATORY_CARE_PROVIDER_SITE_OTHER): Payer: Self-pay | Admitting: *Deleted

## 2017-04-05 ENCOUNTER — Telehealth: Payer: Self-pay | Admitting: Orthopedic Surgery

## 2017-04-05 ENCOUNTER — Other Ambulatory Visit: Payer: Self-pay | Admitting: *Deleted

## 2017-04-05 ENCOUNTER — Encounter (INDEPENDENT_AMBULATORY_CARE_PROVIDER_SITE_OTHER): Payer: Self-pay | Admitting: *Deleted

## 2017-04-05 ENCOUNTER — Ambulatory Visit (INDEPENDENT_AMBULATORY_CARE_PROVIDER_SITE_OTHER): Payer: Medicare Other | Admitting: Internal Medicine

## 2017-04-05 ENCOUNTER — Encounter (INDEPENDENT_AMBULATORY_CARE_PROVIDER_SITE_OTHER): Payer: Self-pay | Admitting: Internal Medicine

## 2017-04-05 VITALS — BP 130/70 | HR 72 | Temp 98.0°F | Ht 68.0 in | Wt 225.2 lb

## 2017-04-05 DIAGNOSIS — D508 Other iron deficiency anemias: Secondary | ICD-10-CM

## 2017-04-05 DIAGNOSIS — D649 Anemia, unspecified: Secondary | ICD-10-CM | POA: Insufficient documentation

## 2017-04-05 DIAGNOSIS — M48061 Spinal stenosis, lumbar region without neurogenic claudication: Secondary | ICD-10-CM

## 2017-04-05 MED ORDER — PEG 3350-KCL-NA BICARB-NACL 420 G PO SOLR: 4000.0000 mL | Freq: Once | ORAL | 0 refills | Status: AC

## 2017-04-05 NOTE — Patient Instructions (Signed)
The risks of bleeding, perforation and infection were reviewed with patient.  

## 2017-04-05 NOTE — Telephone Encounter (Signed)
Patient needs trilyte 

## 2017-04-05 NOTE — Progress Notes (Signed)
Subjective:    Patient ID: [name], male    DOB: [birth year], 66 y.o.   MRN: [id number]         Referred by Dr. Lowanda Foster for IDA/colonoscopy. Patient states he has hx of IDA. Has tried OTC iron in the past. Says po iron does not bring iron up.  Has had 2 Iron infusion x 2  5/15 and 5/22.  Hx of kidney stone about 30 yrs ago.  He denies seeing any rectal bleeding.  His appetite is good. No weight loss.  BM x 1 a day.  No NSAIDS except for ASA 81mg  daily.   05/25/2012 Colonoscopy: average risk.  Findings:   Prep excellent. Normal terminal ileum. Small polyp ablated via cold biopsy from hepatic flexure. Few scattered diverticula at sigmoid colon. Normal rectal mucosa. Small hemorrhoids below the dentate line.  Biopsy results reviewed the patient. Since he has single small tubular adenoma he could date 10 years before his next colonoscopy  02/25/2002 Colonoscopy: elective. A few tiny diverticula at sigmoid colon otherwise normal   Biopsy results reviewed the patient. Since he has single small tubular adenoma he could date 10 years before his next colonoscopy   02/24/2017 H and H  12.8 and 38.3, Ferritin 22, Iron 47, Iron Sat 12, TIBC 381.  10/30/2016 H and H 12.0 and 36.6, Ferritin 30, TIBC 387, Iron 63, Iron Sat 16 05/29/2016 H and H 13.1 and 39.8 Ferritin 56, TIBC 331, Iron 102, Iron Sat 31   Review of Systems Past Medical History:  Diagnosis Date  . Arthritis    spinal stenosis  . GERD (gastroesophageal reflux disease)   . Hyperlipidemia   . Hypertension   . Kidney function abnormal   . 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 Sleep apnea    uses Cpap    Past Surgical History:  Procedure Laterality Date  . CATARACT EXTRACTION     10-15 years ago  . COLONOSCOPY  05/25/2012   Procedure: COLONOSCOPY;  Surgeon: Rogene Houston, MD;  [facility];  Service: Endoscopy;  Laterality: N/A;  1030  . CYSTOSCOPY/RETROGRADE/URETEROSCOPY Right 06/07/2014    Procedure: CYSTOSCOPY, RIGHT RETROGRADE, RIGHT URETEROSCOPY, RIGHT URETERAL BALLOON DILATATION, BIOPSY, RIGHT URETERAL STENT;  Surgeon: Jorja Loa, MD;  Location: [facility];  Service: Urology;  Laterality: Right;  . kidney stone  1983  . left foot surgery  2010    No Known Allergies  Current Outpatient Prescriptions on File Prior to Visit  Medication Sig Dispense Refill  . aspirin 81 MG chewable tablet Chew 81 mg by mouth.    . gabapentin (NEURONTIN) 600 MG tablet 1/2 to 1 po nightly 30 tablet 5  . hydrochlorothiazide (MICROZIDE) 12.5 MG capsule Take 12.5 mg by mouth daily.    . pramipexole (MIRAPEX) 0.25 MG tablet Take 2 to 3 pills po nightly (Patient taking differently: Take 2 pills po nightly) 90 tablet 5  . traMADol (ULTRAM) 50 MG tablet Take 50 mg by mouth 3 (three) times daily.    Marland Kitchen zolpidem (AMBIEN) 10 MG tablet Take 10 mg by mouth at bedtime.     No current facility-administered medications on file prior to visit.         Objective:   Physical Exam Blood pressure 130/70, pulse 72, temperature 98 F (36.7 C), height 5\' 8"  (1.727 m), weight 225 lb 3.2 oz (102.2 kg).    Stool brown and guaiac negative.  Assessment & Plan:  IDA. Colonic neoplasm needs to be ruled. AVM, polyp also needs to be ruled out. Colonoscopy

## 2017-04-05 NOTE — Telephone Encounter (Signed)
INJECTIONS ORDERED

## 2017-04-05 NOTE — Telephone Encounter (Signed)
Patient called stating that he saw Dr. Aline Brochure last year and was sent to [place] for injections for his back. He stated that Dr. Aline Brochure told him then that if he started having any problems and needed to be referred back to [facility] to just call the office. He states that he would like to be referred again. He is leaving in two weeks and will be out of the [place] for [place]17 days and would like to get the injections before leaving.  Please call and advise  [phone]

## 2017-04-08 ENCOUNTER — Ambulatory Visit
Admission: RE | Admit: 2017-04-08 | Discharge: 2017-04-08 | Disposition: A | Discharge status: Home / Self Care | Payer: Medicare Other | Source: Ambulatory Visit | Attending: Orthopedic Surgery | Admitting: Orthopedic Surgery

## 2017-04-08 ENCOUNTER — Telehealth: Payer: Self-pay | Admitting: Diagnostic Radiology

## 2017-04-08 DIAGNOSIS — M48061 Spinal stenosis, lumbar region without neurogenic claudication: Secondary | ICD-10-CM

## 2017-04-08 DIAGNOSIS — M545 Low back pain: Secondary | ICD-10-CM | POA: Diagnosis not present

## 2017-04-08 MED ORDER — METHYLPREDNISOLONE ACETATE 40 MG/ML INJ SUSP (RADIOLOG
120.0000 mg | Freq: Once | INTRAMUSCULAR | Status: AC
  Administered 2017-04-08: 120 mg via EPIDURAL

## 2017-04-08 MED ORDER — IOPAMIDOL (ISOVUE-M 200) INJECTION 41%
1.0000 mL | Freq: Once | INTRAMUSCULAR | Status: AC
  Administered 2017-04-08: 1 mL via EPIDURAL

## 2017-04-08 NOTE — Discharge Instructions (Signed)

## 2017-04-08 NOTE — Progress Notes (Signed)
No encounter.  Was looking at how to doucment a phone call.  Can not find a way to close this without a note.

## 2017-05-12 DIAGNOSIS — G2581 Restless legs syndrome: Secondary | ICD-10-CM | POA: Diagnosis not present

## 2017-05-12 DIAGNOSIS — K649 Unspecified hemorrhoids: Secondary | ICD-10-CM | POA: Diagnosis not present

## 2017-05-12 DIAGNOSIS — G4733 Obstructive sleep apnea (adult) (pediatric): Secondary | ICD-10-CM | POA: Diagnosis not present

## 2017-05-12 DIAGNOSIS — N189 Chronic kidney disease, unspecified: Secondary | ICD-10-CM | POA: Diagnosis not present

## 2017-05-14 ENCOUNTER — Ambulatory Visit (HOSPITAL_COMMUNITY)
Admission: RE | Admit: 2017-05-14 | Discharge: 2017-05-14 | Disposition: A | Discharge status: Home / Self Care | Payer: Medicare Other | Source: Ambulatory Visit | Attending: Internal Medicine | Admitting: Internal Medicine

## 2017-05-14 ENCOUNTER — Encounter (HOSPITAL_COMMUNITY)
Admission: RE | Disposition: A | Discharge status: Home / Self Care | Payer: Self-pay | Source: Ambulatory Visit | Attending: Internal Medicine

## 2017-05-14 ENCOUNTER — Encounter (HOSPITAL_COMMUNITY): Payer: Self-pay | Admitting: *Deleted

## 2017-05-14 DIAGNOSIS — K648 Other hemorrhoids: Secondary | ICD-10-CM | POA: Diagnosis not present

## 2017-05-14 DIAGNOSIS — I10 Essential (primary) hypertension: Secondary | ICD-10-CM | POA: Diagnosis not present

## 2017-05-14 DIAGNOSIS — Z8379 Family history of other diseases of the digestive system: Secondary | ICD-10-CM | POA: Insufficient documentation

## 2017-05-14 DIAGNOSIS — Z9989 Dependence on other enabling machines and devices: Secondary | ICD-10-CM | POA: Diagnosis not present

## 2017-05-14 DIAGNOSIS — Z79899 Other long term (current) drug therapy: Secondary | ICD-10-CM | POA: Diagnosis not present

## 2017-05-14 DIAGNOSIS — D509 Iron deficiency anemia, unspecified: Secondary | ICD-10-CM | POA: Insufficient documentation

## 2017-05-14 DIAGNOSIS — F17290 Nicotine dependence, other tobacco product, uncomplicated: Secondary | ICD-10-CM | POA: Diagnosis not present

## 2017-05-14 DIAGNOSIS — Z86010 Personal history of colonic polyps: Secondary | ICD-10-CM | POA: Insufficient documentation

## 2017-05-14 DIAGNOSIS — E785 Hyperlipidemia, unspecified: Secondary | ICD-10-CM | POA: Diagnosis not present

## 2017-05-14 DIAGNOSIS — G4730 Sleep apnea, unspecified: Secondary | ICD-10-CM | POA: Diagnosis not present

## 2017-05-14 DIAGNOSIS — R151 Fecal smearing: Secondary | ICD-10-CM | POA: Diagnosis not present

## 2017-05-14 DIAGNOSIS — K219 Gastro-esophageal reflux disease without esophagitis: Secondary | ICD-10-CM | POA: Insufficient documentation

## 2017-05-14 DIAGNOSIS — Z7982 Long term (current) use of aspirin: Secondary | ICD-10-CM | POA: Diagnosis not present

## 2017-05-14 DIAGNOSIS — D508 Other iron deficiency anemias: Secondary | ICD-10-CM

## 2017-05-14 DIAGNOSIS — M1990 Unspecified osteoarthritis, unspecified site: Secondary | ICD-10-CM | POA: Insufficient documentation

## 2017-05-14 DIAGNOSIS — D649 Anemia, unspecified: Secondary | ICD-10-CM | POA: Insufficient documentation

## 2017-05-14 HISTORY — PX: COLONOSCOPY: SHX5424

## 2017-05-14 LAB — HEPATIC FUNCTION PANEL
ALT: 21 U/L (ref 17–63)
AST: 23 U/L (ref 15–41)
Albumin: 3.7 g/dL (ref 3.5–5.0)
Alkaline Phosphatase: 53 U/L (ref 38–126)
BILIRUBIN DIRECT: 0.1 mg/dL (ref 0.1–0.5)
BILIRUBIN INDIRECT: 0.7 mg/dL (ref 0.3–0.9)
BILIRUBIN TOTAL: 0.8 mg/dL (ref 0.3–1.2)
Total Protein: 6.1 g/dL — ABNORMAL LOW (ref 6.5–8.1)

## 2017-05-14 LAB — FERRITIN: Ferritin: 207 ng/mL (ref 24–336)

## 2017-05-14 LAB — IRON AND TIBC
Iron: 91 ug/dL (ref 45–182)
SATURATION RATIOS: 27 % (ref 17.9–39.5)
TIBC: 333 ug/dL (ref 250–450)
UIBC: 242 ug/dL

## 2017-05-14 LAB — LIPID PANEL
Cholesterol: 183 mg/dL (ref 0–200)
HDL: 29 mg/dL — ABNORMAL LOW (ref >40)
LDL Cholesterol: 122 mg/dL — ABNORMAL HIGH (ref 0–99)
TRIGLYCERIDES: 160 mg/dL — AB (ref <150)
Total CHOL/HDL Ratio: 6.3 RATIO
VLDL: 32 mg/dL (ref 0–40)

## 2017-05-14 SURGERY — COLONOSCOPY
Anesthesia: Moderate Sedation

## 2017-05-14 MED ORDER — SODIUM CHLORIDE 0.9 % IV SOLN
INTRAVENOUS | Status: DC
Start: 2017-05-14 — End: 2017-05-14
  Administered 2017-05-14: 10:00:00 via INTRAVENOUS

## 2017-05-14 MED ORDER — MIDAZOLAM HCL 5 MG/5ML IJ SOLN
INTRAMUSCULAR | Status: AC
  Filled 2017-05-14: qty 10

## 2017-05-14 MED ORDER — MEPERIDINE HCL 50 MG/ML IJ SOLN
INTRAMUSCULAR | Status: DC | PRN
  Administered 2017-05-14 (×2): 25 mg via INTRAVENOUS

## 2017-05-14 MED ORDER — STERILE WATER FOR IRRIGATION IR SOLN
Status: DC | PRN
  Administered 2017-05-14: 11:00:00

## 2017-05-14 MED ORDER — MEPERIDINE HCL 50 MG/ML IJ SOLN
INTRAMUSCULAR | Status: AC
  Filled 2017-05-14: qty 1

## 2017-05-14 MED ORDER — PSYLLIUM 28 % PO PACK: 1.0000 | PACK | Freq: Every day | ORAL | Status: DC

## 2017-05-14 MED ORDER — MIDAZOLAM HCL 5 MG/5ML IJ SOLN
INTRAMUSCULAR | Status: DC | PRN
  Administered 2017-05-14 (×2): 1 mg via INTRAVENOUS
  Administered 2017-05-14 (×2): 2 mg via INTRAVENOUS

## 2017-05-14 NOTE — Op Note (Signed)
[facility] Patient Name: [name] Procedure Date: 05/14/2017 10:00 AM MRN: [id number] Date of Birth: July 31, 1951 Attending MD: Hildred Laser , MD CSN: [id number] Age: 66 Admit Type: Outpatient Procedure:                Colonoscopy Indications:              Unexplained iron deficiency anemia Providers:                Hildred Laser, MD, Otis Peak B. Sharon Seller, RN, Aram Candela Referring MD:             Jasper Loser. Luan Pulling, MD Medicines:                Meperidine 50 mg IV, Midazolam 6 mg IV Complications:            No immediate complications. Estimated Blood Loss:     Estimated blood loss: none. Procedure:                Pre-Anesthesia Assessment:                           - Prior to the procedure, a History and Physical                            was performed, and patient medications and                            allergies were reviewed. The patient's tolerance of                            previous anesthesia was also reviewed. The risks                            and benefits of the procedure and the sedation                            options and risks were discussed with the patient.                            All questions were answered, and informed consent                            was obtained. Prior Anticoagulants: The patient has                            taken no previous anticoagulant or antiplatelet                            agents. ASA Grade Assessment: II - A patient with                            mild systemic disease. After reviewing the risks  and benefits, the patient was deemed in                            satisfactory condition to undergo the procedure.                           After obtaining informed consent, the colonoscope                            was passed under direct vision. Throughout the                            procedure, the patient's blood pressure, pulse, and                             oxygen saturations were monitored continuously. The                            EC-[id number] ([id number]) scope was introduced through                            the anus and advanced to the the terminal ileum,                            with identification of the appendiceal orifice and                            IC valve. The colonoscopy was performed without                            difficulty. The patient tolerated the procedure                            well. The quality of the bowel preparation was                            excellent. The terminal ileum, ileocecal valve,                            appendiceal orifice, and rectum were photographed. Scope In: 10:38:30 AM Scope Out: 10:53:13 AM Scope Withdrawal Time: 0 hours 9 minutes 31 seconds  Total Procedure Duration: 0 hours 14 minutes 43 seconds  Findings:      The perianal and digital rectal examinations were normal.      The terminal ileum appeared normal.      The colon (entire examined portion) appeared normal.      Internal hemorrhoids were found during retroflexion. The hemorrhoids       were small. Impression:               - The examined portion of the ileum was normal.                           - The entire examined colon is normal.                           -  Internal hemorrhoids.                           - No specimens collected. Moderate Sedation:      Moderate (conscious) sedation was administered by the endoscopy nurse       and supervised by the endoscopist. The following parameters were       monitored: oxygen saturation, heart rate, blood pressure, CO2       capnography and response to care. Total physician intraservice time was       21 minutes. Recommendation:           - Patient has a contact number available for                            emergencies. The signs and symptoms of potential                            delayed complications were discussed with the                            patient. Return to  normal activities tomorrow.                            Written discharge instructions were provided to the                            patient.                           - Resume previous diet today.                           - Continue present medications.                           - Metamucil 4 g by mouth daily at bedtime.                           - Celiac antibody panel.                           - Repeat colonoscopy in 10 years for screening                            purposes. Procedure Code(s):        --- Professional ---                           [phone], Colonoscopy, flexible; diagnostic, including                            collection of specimen(s) by brushing or washing,                            when performed (separate procedure)  83151, Moderate sedation services provided by the                            same physician or other qualified health care                            professional performing the diagnostic or                            therapeutic service that the sedation supports,                            requiring the presence of an independent trained                            observer to assist in the monitoring of the                            patient's level of consciousness and physiological                            status; initial 15 minutes of intraservice time,                            patient age 85 years or older Diagnosis Code(s):        --- Professional ---                           K64.8, Other hemorrhoids                           D50.9, Iron deficiency anemia, unspecified CPT copyright 2016 American Medical Association. All rights reserved. The codes documented in this report are preliminary and upon coder review may  be revised to meet current compliance requirements. Hildred Laser, MD Hildred Laser, MD 05/14/2017 11:02:02 AM This report has been signed electronically. Number of Addenda: 0

## 2017-05-14 NOTE — H&P (Signed)
[name] is an 66 y.o. male.   Chief Complaint: Patient is here for colonoscopy. HPI: Patient is 66 year old Caucasian male who was found to have iron deficiency anemia and received 2 doses of iron infusion about 2 months ago. He states he is at low iron for many years. He is not able to absorb by mouth iron. He used to be on it PPI for GERD but not anymore. He denies diarrhea melena or rectal bleeding. He does complain of seepage after having formed stool in seepage may go on for a few hours. He denies diarrhea or weight loss. Last colonoscopy was 5 years ago with removal of small polyp and was an adenoma. Brother has Crohn's disease. Family history is negative for CRC.  Past Medical History:  Diagnosis Date  . Arthritis    spinal stenosis  . GERD (gastroesophageal reflux disease)Now diet controlled    . Hyperlipidemia   . Hypertension   . Kidney function abnormal   . 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 Sleep apnea    uses Cpap    Past Surgical History:  Procedure Laterality Date  . CATARACT EXTRACTION     10-15 years ago  . COLONOSCOPY  05/25/2012   Procedure: COLONOSCOPY;  Surgeon: Rogene Houston, MD;  [facility];  Service: Endoscopy;  Laterality: N/A;  1030  . CYSTOSCOPY/RETROGRADE/URETEROSCOPY Right 06/07/2014   Procedure: CYSTOSCOPY, RIGHT RETROGRADE, RIGHT URETEROSCOPY, RIGHT URETERAL BALLOON DILATATION, BIOPSY, RIGHT URETERAL STENT;  Surgeon: Jorja Loa, MD;  Location: [facility];  Service: Urology;  Laterality: Right;  . kidney stone  1983  . left foot surgery  2010    History reviewed. No pertinent family history. Social History:  reports that he has been smoking Cigars.  He has never used smokeless tobacco. He reports that he does not drink alcohol or use drugs.  Allergies: No Known Allergies  Medications Prior to Admission  Medication Sig Dispense Refill  . amitriptyline (ELAVIL) 25 MG tablet Take 25 mg by  mouth at bedtime.    Marland Kitchen aspirin 81 MG chewable tablet Chew 81 mg by mouth.    . gabapentin (NEURONTIN) 600 MG tablet 1/2 to 1 po nightly (Patient taking differently: Take 300 mg by mouth at bedtime. ) 30 tablet 5  . hydrochlorothiazide (MICROZIDE) 12.5 MG capsule Take 12.5 mg by mouth daily.    . pramipexole (MIRAPEX) 0.25 MG tablet Take 2 to 3 pills po nightly (Patient taking differently: Take 0.5 mg by mouth at bedtime. Take 2 pills po nightly) 90 tablet 5  . pravastatin (PRAVACHOL) 40 MG tablet Take 40 mg by mouth every morning.    . traMADol (ULTRAM) 50 MG tablet Take 50 mg by mouth every 6 (six) hours as needed for moderate pain.     Marland Kitchen zolpidem (AMBIEN) 10 MG tablet Take 10 mg by mouth at bedtime.      No results found for this or any previous visit (from the past 48 hour(s)). No results found.  ROS  Blood pressure 121/81, pulse 66, temperature 98.1 F (36.7 C), temperature source Oral, resp. rate 17, height 5\' 7"  (1.702 m), weight 210 lb (95.3 kg), SpO2 100 %. Physical Exam  Constitutional: He appears well-developed and well-nourished.  HENT:  Mouth/Throat: Oropharynx is clear and moist.  Eyes: Conjunctivae are normal. No scleral icterus.  Neck: No thyromegaly present.  Cardiovascular: Normal rate, regular rhythm and normal heart sounds.   No murmur heard.  Respiratory: Effort normal and breath sounds normal.  GI: Soft. He exhibits no distension and no mass. There is no tenderness.  Musculoskeletal: He exhibits no edema.  Neurological: He is alert.  Skin: Skin is warm and dry.     Assessment/Plan Iron deficiency anemia. History of colonic adenoma. Fecal seepage. Diagnostic colonoscopy.  Hildred Laser, MD 05/14/2017, 10:26 AM

## 2017-05-14 NOTE — Discharge Instructions (Signed)
Resume usual medications and diet. Metamucil 4 g by mouth daily at bedtime, No driving for 24 hours. Physician will call with results of blood test.    Colonoscopy, Adult, Care After This sheet gives you information about how to care for yourself after your procedure. Your health care provider may also give you more specific instructions. If you have problems or questions, contact your health care provider. What can I expect after the procedure? After the procedure, it is common to have:  A small amount of blood in your stool for 24 hours after the procedure.  Some gas.  Mild abdominal cramping or bloating.  Follow these instructions at home: General instructions   For the first 24 hours after the procedure: ? Do not drive or use machinery. ? Do not sign important documents. ? Do not drink alcohol. ? Do your regular daily activities at a slower pace than normal. ? Eat soft, easy-to-digest foods. ? Rest often.  Take over-the-counter or prescription medicines only as told by your health care provider.  It is up to you to get the results of your procedure. Ask your health care provider, or the department performing the procedure, when your results will be ready. Relieving cramping and bloating  Try walking around when you have cramps or feel bloated.  Apply heat to your abdomen as told by your health care provider. Use a heat source that your health care provider recommends, such as a moist heat pack or a heating pad. ? Place a towel between your skin and the heat source. ? Leave the heat on for 20-30 minutes. ? Remove the heat if your skin turns bright red. This is especially important if you are unable to feel pain, heat, or cold. You may have a greater risk of getting burned. Eating and drinking  Drink enough fluid to keep your urine clear or pale yellow.  Resume your normal diet as instructed by your health care provider. Avoid heavy or fried foods that are hard to  digest.  Avoid drinking alcohol for as long as instructed by your health care provider. Contact a health care provider if:  You have blood in your stool 2-3 days after the procedure. Get help right away if:  You have more than a small spotting of blood in your stool.  You pass large blood clots in your stool.  Your abdomen is swollen.  You have nausea or vomiting.  You have a fever.  You have increasing abdominal pain that is not relieved with medicine. This information is not intended to replace advice given to you by your health care provider. Make sure you discuss any questions you have with your health care provider. Document Released: 05/26/2004 Document Revised: 07/06/2016 Document Reviewed: 12/24/2015 Elsevier Interactive Patient Education  [employer].

## 2017-05-16 LAB — TISSUE TRANSGLUTAMINASE, IGA

## 2017-05-17 LAB — GLIADIN ANTIBODIES, SERUM
Gliadin IgA: 4 units (ref 0–19)
Gliadin IgG: 3 units (ref 0–19)

## 2017-05-19 ENCOUNTER — Encounter (HOSPITAL_COMMUNITY): Payer: Self-pay | Admitting: Internal Medicine

## 2017-05-19 LAB — RETICULIN ANTIBODIES, IGA W TITER: RETICULIN AB, IGA: NEGATIVE {titer}

## 2017-06-09 DIAGNOSIS — R809 Proteinuria, unspecified: Secondary | ICD-10-CM | POA: Diagnosis not present

## 2017-06-09 DIAGNOSIS — Z79899 Other long term (current) drug therapy: Secondary | ICD-10-CM | POA: Diagnosis not present

## 2017-06-09 DIAGNOSIS — D509 Iron deficiency anemia, unspecified: Secondary | ICD-10-CM | POA: Diagnosis not present

## 2017-06-09 DIAGNOSIS — E559 Vitamin D deficiency, unspecified: Secondary | ICD-10-CM | POA: Diagnosis not present

## 2017-06-09 DIAGNOSIS — I10 Essential (primary) hypertension: Secondary | ICD-10-CM | POA: Diagnosis not present

## 2017-06-09 DIAGNOSIS — N183 Chronic kidney disease, stage 3 (moderate): Secondary | ICD-10-CM | POA: Diagnosis not present

## 2017-06-16 DIAGNOSIS — D509 Iron deficiency anemia, unspecified: Secondary | ICD-10-CM | POA: Diagnosis not present

## 2017-06-16 DIAGNOSIS — E870 Hyperosmolality and hypernatremia: Secondary | ICD-10-CM | POA: Diagnosis not present

## 2017-06-16 DIAGNOSIS — E669 Obesity, unspecified: Secondary | ICD-10-CM | POA: Diagnosis not present

## 2017-06-16 DIAGNOSIS — N183 Chronic kidney disease, stage 3 (moderate): Secondary | ICD-10-CM | POA: Diagnosis not present

## 2017-06-30 ENCOUNTER — Telehealth: Payer: Self-pay | Admitting: *Deleted

## 2017-06-30 NOTE — Telephone Encounter (Signed)
Routing to Dr. Aline Brochure to approve

## 2017-06-30 NOTE — Telephone Encounter (Signed)
Approve esi L spine

## 2017-06-30 NOTE — Telephone Encounter (Signed)
Patient called requesting for Dr Aline Brochure to submit an order for his injections to [facility] imaging. Patient states it is for his back

## 2017-07-01 ENCOUNTER — Other Ambulatory Visit: Payer: Self-pay | Admitting: *Deleted

## 2017-07-01 DIAGNOSIS — M48062 Spinal stenosis, lumbar region with neurogenic claudication: Secondary | ICD-10-CM

## 2017-07-01 NOTE — Telephone Encounter (Signed)
Entered order and called [facility]. They will call patient to schedule.

## 2017-07-05 ENCOUNTER — Other Ambulatory Visit: Payer: Self-pay | Admitting: Orthopedic Surgery

## 2017-07-05 DIAGNOSIS — M48062 Spinal stenosis, lumbar region with neurogenic claudication: Secondary | ICD-10-CM

## 2017-07-14 ENCOUNTER — Ambulatory Visit
Admission: RE | Admit: 2017-07-14 | Discharge: 2017-07-14 | Disposition: A | Discharge status: Home / Self Care | Payer: Medicare Other | Source: Ambulatory Visit | Attending: Orthopedic Surgery | Admitting: Orthopedic Surgery

## 2017-07-14 ENCOUNTER — Encounter (INDEPENDENT_AMBULATORY_CARE_PROVIDER_SITE_OTHER): Payer: Medicare Other | Admitting: Physical Medicine and Rehabilitation

## 2017-07-14 DIAGNOSIS — M48062 Spinal stenosis, lumbar region with neurogenic claudication: Secondary | ICD-10-CM

## 2017-07-14 DIAGNOSIS — M47816 Spondylosis without myelopathy or radiculopathy, lumbar region: Secondary | ICD-10-CM | POA: Diagnosis not present

## 2017-07-14 MED ORDER — METHYLPREDNISOLONE ACETATE 40 MG/ML INJ SUSP (RADIOLOG
120.0000 mg | Freq: Once | INTRAMUSCULAR | Status: AC
  Administered 2017-07-14: 120 mg via EPIDURAL

## 2017-07-14 MED ORDER — IOPAMIDOL (ISOVUE-M 200) INJECTION 41%
1.0000 mL | Freq: Once | INTRAMUSCULAR | Status: AC
  Administered 2017-07-14: 1 mL via EPIDURAL

## 2017-08-11 DIAGNOSIS — M47812 Spondylosis without myelopathy or radiculopathy, cervical region: Secondary | ICD-10-CM | POA: Diagnosis not present

## 2017-08-16 DIAGNOSIS — G4700 Insomnia, unspecified: Secondary | ICD-10-CM | POA: Diagnosis not present

## 2017-08-16 DIAGNOSIS — G4733 Obstructive sleep apnea (adult) (pediatric): Secondary | ICD-10-CM | POA: Diagnosis not present

## 2017-08-16 DIAGNOSIS — N189 Chronic kidney disease, unspecified: Secondary | ICD-10-CM | POA: Diagnosis not present

## 2017-08-16 DIAGNOSIS — G2581 Restless legs syndrome: Secondary | ICD-10-CM | POA: Diagnosis not present

## 2017-08-16 DIAGNOSIS — Z23 Encounter for immunization: Secondary | ICD-10-CM | POA: Diagnosis not present

## 2017-08-23 ENCOUNTER — Other Ambulatory Visit: Payer: Self-pay | Admitting: Neurology

## 2017-08-23 DIAGNOSIS — G4733 Obstructive sleep apnea (adult) (pediatric): Secondary | ICD-10-CM | POA: Diagnosis not present

## 2017-08-23 DIAGNOSIS — G4700 Insomnia, unspecified: Secondary | ICD-10-CM | POA: Diagnosis not present

## 2017-08-23 DIAGNOSIS — G2581 Restless legs syndrome: Secondary | ICD-10-CM | POA: Diagnosis not present

## 2017-08-23 DIAGNOSIS — N184 Chronic kidney disease, stage 4 (severe): Secondary | ICD-10-CM | POA: Diagnosis not present

## 2017-09-08 ENCOUNTER — Ambulatory Visit: Payer: Medicare Other | Admitting: Neurology

## 2017-09-14 ENCOUNTER — Other Ambulatory Visit: Payer: Self-pay

## 2017-09-14 ENCOUNTER — Encounter: Payer: Self-pay | Admitting: Neurology

## 2017-09-14 ENCOUNTER — Ambulatory Visit (INDEPENDENT_AMBULATORY_CARE_PROVIDER_SITE_OTHER): Payer: Medicare Other | Admitting: Neurology

## 2017-09-14 VITALS — BP 125/73 | HR 76 | Resp 18 | Ht 68.0 in | Wt 229.0 lb

## 2017-09-14 DIAGNOSIS — G2581 Restless legs syndrome: Secondary | ICD-10-CM | POA: Diagnosis not present

## 2017-09-14 DIAGNOSIS — G4733 Obstructive sleep apnea (adult) (pediatric): Secondary | ICD-10-CM | POA: Diagnosis not present

## 2017-09-14 DIAGNOSIS — M48062 Spinal stenosis, lumbar region with neurogenic claudication: Secondary | ICD-10-CM

## 2017-09-14 DIAGNOSIS — Z9989 Dependence on other enabling machines and devices: Secondary | ICD-10-CM | POA: Diagnosis not present

## 2017-09-14 MED ORDER — PRAMIPEXOLE DIHYDROCHLORIDE 0.5 MG PO TABS: ORAL_TABLET | ORAL | 3 refills | Status: DC

## 2017-09-14 MED ORDER — GABAPENTIN 600 MG PO TABS: 300.0000 mg | ORAL_TABLET | Freq: Every day | ORAL | 3 refills | Status: DC

## 2017-09-14 NOTE — Progress Notes (Signed)
[facility]  PATIENT: [name] DOB: 08-04-1951  REFERRING DOCTOR OR PCP:  Sinda Du SOURCE: patient, notes from [name], imaging reports, MRI images on PACS  _________________________________   HISTORICAL  CHIEF COMPLAINT:  Chief Complaint  Patient presents with  . Sleep Apnea    Sts. he is compliant with CPAP (managed by pcp, [name]).  Sts. RLS sx. are improved since increasing Mirapex, and after having Iron infusion for anemia. Tried to d/c Amitriptyline, but had nightmares, irritability, so he restarted it/fim  . RLS    HISTORY OF PRESENT ILLNESS:  [name] is a 66 yo man restless leg syndrome.      Update 09/14/2017:     He feels his RLS is much better and he only gets mild symptoms at times, not bad enough to make him shake his legs or get out of bed.     He is sleeping well, getting 7 hours of sleep.  He takes Mirapex 0.5 and gabapentin 300 (1/2 of a 600 mg tablet)  nightly.   He also takes amitriptyline 25 mg and felt worse when he tried to discontinue it.   He also takes Ambien at night.    He has OSA and uses it nightly.   He also uses it for naps if in bed.      He gets some LBP and leg pain.   He gets ESI's about once a month with beenfit.   He [facility] Dr. Ellene [facility]uevas [facility].  From 03/08/2017: He has RLS that can be severe at times and is sometimes in his arms and it occurs for a week or so every month.    He was unable to stop amitriptyline as his sleep maintenance worsened tremendously (has been on x 20 years).    When present, he feels an itching sensation and crawly sensation on his skin that improves with walking or moving around.     Requip was poorly tolerated (bad dreams and drowsiness during the day).  Mirapex 0.25 mg is helping some.  Gabapentin 300 mg may be helping slightly.   He also has had iron infusions and that may be helping.  He also has OSA.   He is on CPAP and is very compliant.     He has sleep onset and  sleep maintenance insomnia.   Ambien greatly helped his sleep onset insomnia and amitriptyline hlps the sleep maintenance.  Now, once asleep he sleeps well.       MRI of the lumbar spine 09/16/2016 shows severe spinal stenosis at L4-L5 at that level. There is facet and ligamentum flavum hypertrophy and congenitally short pedicles. There is also a left lateral disc bulge. At L5-S1 there is facet hypertrophy and a right lateral disc bulge that does not compress any nerve roots.    MRI of the brain 11/10/2016 was normal.   I also reviewed an MRI of the cervical spine from 2012. He has borderline to mild spinal stenosis at a couple levels in the mid cervical spine and multilevel narrowing though there is no definite nerve root compression.  The worse foraminal narrowing is to the left at C7-T1 that could affect the C8 nerve root.  REVIEW OF SYSTEMS: Constitutional: No fevers, chills, sweats, or change in appetite Eyes: No visual changes, double vision, eye pain Ear, nose and throat: No hearing loss, ear pain, nasal congestion, sore throat Cardiovascular: No chest pain, palpitations Respiratory: No shortness of breath at rest or with exertion.  No wheezes.   He has OSA on CPAP GastrointestinaI: No nausea, vomiting, diarrhea, abdominal pain, fecal incontinence Genitourinary: No dysuria, urinary retention or frequency.  No nocturia. Musculoskeletal: Reports mild neck pain, back pain Integumentary: No rash, pruritus, skin lesions Neurological: as above Psychiatric: No depression at this time.  No anxiety Endocrine: No palpitations, diaphoresis, change in appetite, change in weigh or increased thirst Hematologic/Lymphatic: No anemia, purpura, petechiae. Allergic/Immunologic: No itchy/runny eyes, nasal congestion, recent allergic reactions, rashes  ALLERGIES: No Known Allergies  HOME MEDICATIONS:  Current Outpatient Medications:  .  amitriptyline (ELAVIL) 25 MG tablet, Take 25 mg by mouth at  bedtime., Disp: , Rfl:  .  aspirin 81 MG chewable tablet, Chew 81 mg by mouth., Disp: , Rfl:  .  gabapentin (NEURONTIN) 600 MG tablet, Take 0.5-1 tablets (300-600 mg total) by mouth at bedtime., Disp: 90 tablet, Rfl: 3 .  hydrochlorothiazide (MICROZIDE) 12.5 MG capsule, Take 12.5 mg by mouth daily., Disp: , Rfl:  .  Multiple Vitamin (MULTIVITAMIN) capsule, Take by mouth., Disp: , Rfl:  .  pravastatin (PRAVACHOL) 40 MG tablet, Take 40 mg by mouth every morning., Disp: , Rfl:  .  psyllium (METAMUCIL SMOOTH TEXTURE) 28 % packet, Take 1 packet by mouth at bedtime., Disp: , Rfl:  .  traMADol (ULTRAM) 50 MG tablet, Take 50 mg by mouth every 6 (six) hours as needed for moderate pain. , Disp: , Rfl:  .  zolpidem (AMBIEN) 10 MG tablet, Take 10 mg by mouth at bedtime., Disp: , Rfl:  .  amitriptyline (ELAVIL) 10 MG tablet, Take 10 mg by mouth., Disp: , Rfl:  .  pramipexole (MIRAPEX) 0.5 MG tablet, Take one or two at bedtime, Disp: 180 tablet, Rfl: 3  PAST MEDICAL HISTORY: Past Medical History:  Diagnosis Date  . Arthritis    spinal stenosis  . GERD (gastroesophageal reflux disease)   . Hyperlipidemia   . Hypertension   . Kidney function abnormal   . 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 Sleep apnea    uses Cpap    PAST SURGICAL HISTORY: Past Surgical History:  Procedure Laterality Date  . CATARACT EXTRACTION     10-15 years ago  . COLONOSCOPY  05/25/2012   Procedure: COLONOSCOPY;  Surgeon: Rogene Houston, MD;  [facility];  Service: Endoscopy;  Laterality: N/A;  1030  . COLONOSCOPY N/A 05/14/2017   Procedure: COLONOSCOPY;  Surgeon: Rogene Houston, MD;  [facility]: [facility];  Service: Endoscopy;  Laterality: N/A;  10:10  . CYSTOSCOPY/RETROGRADE/URETEROSCOPY Right 06/07/2014   Procedure: CYSTOSCOPY, RIGHT RETROGRADE, RIGHT URETEROSCOPY, RIGHT URETERAL BALLOON DILATATION, BIOPSY, RIGHT URETERAL STENT;  Surgeon: Jorja Loa, MD;  Location: [facility];  Service: Urology;  Laterality: Right;  . kidney stone  1983  . left foot surgery  2010    FAMILY HISTORY: History reviewed. No pertinent family history.  SOCIAL HISTORY:  Social History   Socioeconomic History  . Marital status: Married    Spouse name: Not on file  . Number of children: Not on file  . Years of education: Not on file  . Highest education level: Not on file  Social Needs  . Financial resource strain: Not on file  . Food insecurity - worry: Not on file  . Food insecurity - inability: Not on file  . Transportation needs - medical: Not on file  . Transportation needs - non-medical: Not on file  Occupational History  .  Not on file  Tobacco Use  . Smoking status: Current Some Day Smoker    Types: Cigars  . Smokeless tobacco: Never Used  Substance and Sexual Activity  . Alcohol use: No    Comment: rare  . Drug use: No  . Sexual activity: Not on file  Other Topics Concern  . Not on file  Social History Narrative  . Not on file     PHYSICAL EXAM  Vitals:   09/14/17 1130  BP: 125/73  Pulse: 76  Resp: 18  Weight: 229 lb (103.9 kg)  Height: 5\' 8"  (1.727 m)    Body mass index is 34.82 kg/m.   General: The patient is well-developed and well-nourished and in no acute distress   Skin: Extremities are without rash or edema.   Neurologic Exam  Mental status: The patient is alert and oriented x 3 at the time of the examination. The patient has apparent normal recent and remote memory, with an apparently normal attention span and concentration ability.   Speech is normal.  Cranial nerves: Extraocular movements are full. . Sensation is normal. Trapezius strength is normal.  The tongue is midline, and the patient has symmetric elevation of the soft palate. No obvious hearing deficits are noted.  Motor:  Muscle bulk is normal.   Tone is normal. Strength is  5 / 5 in all 4 extremities.   Sensory: He has intact sensation to touch and vibration in  the arms. In the legs he has intact touch but there is mild reduced vibration in the feet.  Coordination: Cerebellar testing reveals good finger-nose-finger and heel-to-shin bilaterally.  Gait and station: Station is normal.   Gait is normal. Tandem gait is normal. Romberg is negative.   Reflexes: Deep tendon reflexes are symmetric and normal bilaterally.       DIAGNOSTIC DATA (LABS, IMAGING, TESTING) - I reviewed patient records, labs, notes, testing and imaging myself where available.  Lab Results  Component Value Date   HGB 12.9 (L) 06/07/2014   HCT 38.0 (L) 06/07/2014      Component Value Date/Time   NA 142 06/07/2014 0907   K 3.7 06/07/2014 0907   GLUCOSE 107 (H) 06/07/2014 0907   PROT 6.1 (L) 05/14/2017 0940   PROT 6.8 12/07/2016 1026   ALBUMIN 3.7 05/14/2017 0940   AST 23 05/14/2017 0940   ALT 21 05/14/2017 0940   ALKPHOS 53 05/14/2017 0940   BILITOT 0.8 05/14/2017 0940       ASSESSMENT AND PLAN   Restless leg syndrome  OSA on CPAP  Spinal stenosis of lumbar region with neurogenic claudication   1.   Continue Mirapex no change seen prescription to 0.5 mg pill and he can take 1 or 2 of these at night. He can also take gabapentin 300-600 mg at night. Since he has only one kidney and creatinine has been elevated we will not push the gabapentin dose dose any higher.   2.   CPAP for OSA. 3.    He will continue to follow-up with his primary care for his reduced iron. 4.    He will return to see me in 12 months but call sooner if he has any new or worsening neurologic symptoms.    Richard A. Felecia Shelling, MD, PhD 67/61/9509, 32:67 AM Certified in Neurology, Clinical Neurophysiology, Sleep Medicine, Pain Medicine and Neuroimaging  [facility] [address] [place], [place] [address] [phone]

## 2017-09-30 DIAGNOSIS — R809 Proteinuria, unspecified: Secondary | ICD-10-CM | POA: Diagnosis not present

## 2017-09-30 DIAGNOSIS — I10 Essential (primary) hypertension: Secondary | ICD-10-CM | POA: Diagnosis not present

## 2017-09-30 DIAGNOSIS — D509 Iron deficiency anemia, unspecified: Secondary | ICD-10-CM | POA: Diagnosis not present

## 2017-09-30 DIAGNOSIS — N183 Chronic kidney disease, stage 3 (moderate): Secondary | ICD-10-CM | POA: Diagnosis not present

## 2017-09-30 DIAGNOSIS — Z79899 Other long term (current) drug therapy: Secondary | ICD-10-CM | POA: Diagnosis not present

## 2017-09-30 DIAGNOSIS — E559 Vitamin D deficiency, unspecified: Secondary | ICD-10-CM | POA: Diagnosis not present

## 2017-10-06 DIAGNOSIS — N183 Chronic kidney disease, stage 3 (moderate): Secondary | ICD-10-CM | POA: Diagnosis not present

## 2017-10-06 DIAGNOSIS — E870 Hyperosmolality and hypernatremia: Secondary | ICD-10-CM | POA: Diagnosis not present

## 2017-10-06 DIAGNOSIS — D649 Anemia, unspecified: Secondary | ICD-10-CM | POA: Diagnosis not present

## 2017-10-06 DIAGNOSIS — I10 Essential (primary) hypertension: Secondary | ICD-10-CM | POA: Diagnosis not present

## 2017-11-05 DIAGNOSIS — J209 Acute bronchitis, unspecified: Secondary | ICD-10-CM | POA: Diagnosis not present

## 2017-12-01 DIAGNOSIS — J209 Acute bronchitis, unspecified: Secondary | ICD-10-CM | POA: Diagnosis not present

## 2017-12-01 DIAGNOSIS — K210 Gastro-esophageal reflux disease with esophagitis: Secondary | ICD-10-CM | POA: Diagnosis not present

## 2017-12-01 DIAGNOSIS — G4733 Obstructive sleep apnea (adult) (pediatric): Secondary | ICD-10-CM | POA: Diagnosis not present

## 2017-12-01 DIAGNOSIS — E669 Obesity, unspecified: Secondary | ICD-10-CM | POA: Diagnosis not present

## 2018-01-13 DIAGNOSIS — Z125 Encounter for screening for malignant neoplasm of prostate: Secondary | ICD-10-CM | POA: Diagnosis not present

## 2018-01-13 DIAGNOSIS — R809 Proteinuria, unspecified: Secondary | ICD-10-CM | POA: Diagnosis not present

## 2018-01-13 DIAGNOSIS — Z1159 Encounter for screening for other viral diseases: Secondary | ICD-10-CM | POA: Diagnosis not present

## 2018-01-13 DIAGNOSIS — Z79899 Other long term (current) drug therapy: Secondary | ICD-10-CM | POA: Diagnosis not present

## 2018-01-13 DIAGNOSIS — D519 Vitamin B12 deficiency anemia, unspecified: Secondary | ICD-10-CM | POA: Diagnosis not present

## 2018-01-13 DIAGNOSIS — D509 Iron deficiency anemia, unspecified: Secondary | ICD-10-CM | POA: Diagnosis not present

## 2018-01-13 DIAGNOSIS — I10 Essential (primary) hypertension: Secondary | ICD-10-CM | POA: Diagnosis not present

## 2018-01-13 DIAGNOSIS — N183 Chronic kidney disease, stage 3 (moderate): Secondary | ICD-10-CM | POA: Diagnosis not present

## 2018-01-17 DIAGNOSIS — Z0000 Encounter for general adult medical examination without abnormal findings: Secondary | ICD-10-CM | POA: Diagnosis not present

## 2018-01-19 DIAGNOSIS — E870 Hyperosmolality and hypernatremia: Secondary | ICD-10-CM | POA: Diagnosis not present

## 2018-01-19 DIAGNOSIS — N183 Chronic kidney disease, stage 3 (moderate): Secondary | ICD-10-CM | POA: Diagnosis not present

## 2018-01-19 DIAGNOSIS — E669 Obesity, unspecified: Secondary | ICD-10-CM | POA: Diagnosis not present

## 2018-01-19 DIAGNOSIS — D649 Anemia, unspecified: Secondary | ICD-10-CM | POA: Diagnosis not present

## 2018-02-24 ENCOUNTER — Ambulatory Visit (INDEPENDENT_AMBULATORY_CARE_PROVIDER_SITE_OTHER): Payer: Medicare Other | Admitting: Otolaryngology

## 2018-02-24 DIAGNOSIS — J343 Hypertrophy of nasal turbinates: Secondary | ICD-10-CM | POA: Diagnosis not present

## 2018-02-24 DIAGNOSIS — J310 Chronic rhinitis: Secondary | ICD-10-CM

## 2018-02-25 ENCOUNTER — Other Ambulatory Visit (INDEPENDENT_AMBULATORY_CARE_PROVIDER_SITE_OTHER): Payer: Self-pay | Admitting: Otolaryngology

## 2018-02-25 DIAGNOSIS — J320 Chronic maxillary sinusitis: Secondary | ICD-10-CM

## 2018-03-16 ENCOUNTER — Ambulatory Visit (HOSPITAL_COMMUNITY)
Admission: RE | Admit: 2018-03-16 | Discharge: 2018-03-16 | Disposition: A | Discharge status: Home / Self Care | Payer: Medicare Other | Source: Ambulatory Visit | Attending: Otolaryngology | Admitting: Otolaryngology

## 2018-03-16 DIAGNOSIS — J320 Chronic maxillary sinusitis: Secondary | ICD-10-CM

## 2018-03-16 DIAGNOSIS — J3489 Other specified disorders of nose and nasal sinuses: Secondary | ICD-10-CM | POA: Diagnosis not present

## 2018-03-23 ENCOUNTER — Ambulatory Visit (INDEPENDENT_AMBULATORY_CARE_PROVIDER_SITE_OTHER): Payer: Medicare Other | Admitting: Orthopedic Surgery

## 2018-03-23 ENCOUNTER — Encounter: Payer: Self-pay | Admitting: Orthopedic Surgery

## 2018-03-23 ENCOUNTER — Ambulatory Visit (INDEPENDENT_AMBULATORY_CARE_PROVIDER_SITE_OTHER): Payer: Medicare Other

## 2018-03-23 VITALS — BP 146/90 | HR 97 | Ht 68.0 in | Wt 225.0 lb

## 2018-03-23 DIAGNOSIS — M23321 Other meniscus derangements, posterior horn of medial meniscus, right knee: Secondary | ICD-10-CM

## 2018-03-23 DIAGNOSIS — M25561 Pain in right knee: Secondary | ICD-10-CM | POA: Diagnosis not present

## 2018-03-23 MED ORDER — HYDROCODONE-ACETAMINOPHEN 5-325 MG PO TABS: 1.0000 | ORAL_TABLET | Freq: Four times a day (QID) | ORAL | 0 refills | Status: AC | PRN

## 2018-03-23 NOTE — Patient Instructions (Signed)
Ice to knee 3 times a day  Wear knee brace  Take hydrocodone for pain  Okay to use topical medications such as Aspercreme or capsaicin BenGay  Meniscus Injury, Arthroscopy Arthroscopy is a surgical procedure that involves the use of a small scope that has a camera and surgical instruments on the end (arthroscope). An arthroscope can be used to repair your meniscus injury.  LET [facility] CARE PROVIDER KNOW ABOUT:  Any allergies you have.  All medicines you are taking, including vitamins, herbs, eyedrops, creams, and over-the-counter medicines.  Any recent colds or infections you have had or currently have.  Previous problems you or members of your family have had with the use of anesthetics.  Any blood disorders or blood clotting problems you have.  Previous surgeries you have had.  Medical conditions you have. RISKS AND COMPLICATIONS Generally, this is a safe procedure. However, as with any procedure, problems can occur. Possible problems include:  Damage to nerves or blood vessels.  Excess bleeding.  Blood clots.  Infection. BEFORE THE PROCEDURE  Do not eat or drink for 6-8 hours before the procedure.  Take medicines as directed by your surgeon. Ask your surgeon about changing or stopping your regular medicines.  You may have lab tests the morning of surgery. PROCEDURE  You will be given one of the following:   A medicine that numbs the area (local anesthesia).  A medicine that makes you go to sleep (general anesthesia).  A medicine injected into your spine that numbs your body below the waist (spinal anesthesia). Most often, several small cuts (incisions) are made in the knee. The arthroscope and instruments go into the incisions to repair the damage. The torn portion of the meniscus is removed.   AFTER THE PROCEDURE  You will be taken to the recovery area where your progress will be monitored. When you are awake, stable, and taking fluids without  complications, you will be allowed to go home. This is usually the same day. A torn or stretched ligament (ligament sprain) may take 6-8 weeks to heal.   It takes about the 4-6 WEEKS if your surgeon removed a torn meniscus.  A repaired meniscus may require 6-12 weeks of recovery time.  A torn ligament needing reconstructive surgery may take 6-12 months to heal fully.   This information is not intended to replace advice given to you by your health care provider. Make sure you discuss any questions you have with your health care provider. You have decided to proceed with operative arthroscopy of the knee. You have decided not to continue with nonoperative measures such as but not limited to oral medication, weight loss, activity modification, physical therapy, bracing, or injection.  We will perform operative arthroscopy of the knee. Some of the risks associated with arthroscopic surgery of the knee include but are not limited to Bleeding Infection Swelling Stiffness Blood clot Pain  If you're not comfortable with these risks and would like to continue with nonoperative treatment please let Dr. Aline Brochure know prior to your surgery.   Document Released: 10/09/2000 Document Revised: 10/17/2013 Document Reviewed: 03/10/2013 Elsevier Interactive Patient Education 2016 [place] have decided to proceed with operative arthroscopy of the knee. You have decided not to continue with nonoperative measures such as but not limited to oral medication, weight loss, activity modification, physical therapy, bracing, or injection.  We will perform operative arthroscopy of the knee. Some of the risks associated with arthroscopic surgery of the knee include but are not  limited to Bleeding Infection Swelling Stiffness Blood clot Pain  If you're not comfortable with these risks and would like to continue with nonoperative treatment please let Dr. Aline Brochure know prior to your surgery.

## 2018-03-23 NOTE — Progress Notes (Signed)
Progress Note:NEW PROBLEM   Patient ID: [name], male   DOB: 12/31/1950, 67 y.o.   MRN: [id number] Chief Complaint  Patient presents with  . Knee Pain    right knee increased pain since twisting end of March      Medical decision-making  Imaging:  Xrays ordered: y/n ? yes  Encounter Diagnoses  Name Primary?  . Acute pain of right knee Yes  . Derangement of posterior horn of medial meniscus of right knee      PLAN:  He is going to [place] for a week he will be back on the 10th  He is been aware of brace were going to give him some pain medication to take we injected his knee for pain relief  When he comes back on the 11th we will reassess his knee if necessary we will proceed with arthroscopic surgery of the right knee with partial medial meniscectomy as he had a torn medial meniscus on MRI before tolerated for 6 years and to the recent twisting injury.  FURTHER WORKUP: Arthroscopy right knee partial medial meniscectomy  Meds ordered this encounter  Medications  . HYDROcodone-acetaminophen (NORCO/VICODIN) 5-325 MG tablet    Sig: Take 1 tablet by mouth every 6 (six) hours as needed for up to 5 days for moderate pain.    Dispense:  20 tablet    Refill:  0   Data bank reviewed     Chief Complaint  Patient presents with  . Knee Pain    right knee increased pain since twisting end of March     67 year old male history of meniscal tear diagnosed and confirmed with MRI 6 to 7 years ago presents with a new twisting injury to the right knee a month or 2 ago.  Since that time his knee is worsened and he presents with moderately severe dull aching medial knee pain along the right knee medial joint line now associated with pain in his right hip and he has a history of spinal stenosis and the limping is making that worse.  Mild swelling of the joint mild loss of motion especially flexion and painful extension is noted.  He has a history of chronic kidney disease and cannot take  anti-inflammatories      Review of Systems  Constitutional: Negative for fever.  HENT: Negative.   Respiratory: Negative for shortness of breath.   Cardiovascular: Negative for chest pain.  Musculoskeletal: Positive for back pain and joint pain.  Skin: Negative.   Neurological: Negative for tingling, tremors and sensory change.   Current Meds  Medication Sig  . amitriptyline (ELAVIL) 25 MG tablet Take 25 mg by mouth at bedtime.  Marland Kitchen aspirin 81 MG chewable tablet Chew 81 mg by mouth.  . gabapentin (NEURONTIN) 600 MG tablet Take 0.5-1 tablets (300-600 mg total) by mouth at bedtime.  . hydrochlorothiazide (MICROZIDE) 12.5 MG capsule Take 12.5 mg by mouth daily.  . Multiple Vitamin (MULTIVITAMIN) capsule Take by mouth.  . pramipexole (MIRAPEX) 0.5 MG tablet Take one or two at bedtime  . traMADol (ULTRAM) 50 MG tablet Take 50 mg by mouth every 6 (six) hours as needed for moderate pain.   Marland Kitchen zolpidem (AMBIEN) 10 MG tablet Take 10 mg by mouth at bedtime.    Past Medical History:  Diagnosis Date  . Arthritis    spinal stenosis  . GERD (gastroesophageal reflux disease)   . Hyperlipidemia   . Hypertension   . Kidney function abnormal   . 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 Sleep apnea    uses Cpap     No Known Allergies  BP (!) 146/90   Pulse 97   Ht 5\' 8"  (1.727 m)   Wt 225 lb (102.1 kg)   BMI 34.21 kg/m    Physical Exam  Constitutional: He is oriented to person, place, and time. He appears well-developed and well-nourished. No distress.  HENT:  Head: Normocephalic and atraumatic.  Right Ear: External ear normal.  Left Ear: External ear normal.  Nose: Nose normal.  Eyes: Pupils are equal, round, and reactive to light. Conjunctivae and EOM are normal. Right eye exhibits no discharge. Left eye exhibits no discharge.  Neck: Normal range of motion. Neck supple. No tracheal deviation present. No thyromegaly present.  Cardiovascular: Normal rate, regular rhythm  and intact distal pulses.  Pulmonary/Chest: Effort normal. No respiratory distress.  Musculoskeletal:       Right knee: He exhibits effusion.  Lymphadenopathy:    He has no cervical adenopathy.  Neurological: He is alert and oriented to person, place, and time. He has normal reflexes.  Skin: Skin is warm and dry. Capillary refill takes less than 2 seconds. He is not diaphoretic.  Psychiatric: He has a normal mood and affect. His behavior is normal. Judgment and thought content normal.    Right Knee Exam   Muscle Strength  The patient has normal right knee strength.  Tenderness  The patient is experiencing tenderness in the medial joint line.  Range of Motion  Extension: normal  Flexion: normal   Tests  McMurray:  Medial - positive Lateral - negative Varus: negative Valgus: negative Drawer:  Anterior - negative    Posterior - negative  Other  Erythema: absent Scars: absent Sensation: normal Pulse: present Swelling: none Effusion: effusion present   Left Knee Exam   Muscle Strength  The patient has normal left knee strength.  Tenderness  The patient is experiencing no tenderness.   Range of Motion  Extension: normal  Flexion: normal   Tests  McMurray:  Medial - negative Lateral - negative Varus: negative Valgus: negative Drawer:  Anterior - negative     Posterior - negative  Other  Erythema: absent Scars: absent Sensation: normal Pulse: present Swelling: none      Arther Abbott, MD  9:52 AM 03/23/2018

## 2018-03-24 ENCOUNTER — Telehealth: Payer: Self-pay | Admitting: Orthopedic Surgery

## 2018-03-24 ENCOUNTER — Ambulatory Visit (INDEPENDENT_AMBULATORY_CARE_PROVIDER_SITE_OTHER): Payer: Medicare Other | Admitting: Otolaryngology

## 2018-03-24 DIAGNOSIS — J310 Chronic rhinitis: Secondary | ICD-10-CM | POA: Diagnosis not present

## 2018-03-24 DIAGNOSIS — J343 Hypertrophy of nasal turbinates: Secondary | ICD-10-CM | POA: Diagnosis not present

## 2018-03-24 NOTE — Telephone Encounter (Signed)
Patient requests to postpone surgery date by one week, due to going out of town. Please advise.

## 2018-03-24 NOTE — Telephone Encounter (Signed)
No problem at all, we will see him back in the office to discuss when he is back (not posted yet)   Is he still coming in on 04/05/18 for follow up, or does he need to RS this visit ?

## 2018-03-25 NOTE — Telephone Encounter (Signed)
Called patient; states he will still keep the 04/05/18 visit appointment, although requests to have surgery at least one week out from that visit date.

## 2018-04-05 ENCOUNTER — Ambulatory Visit (INDEPENDENT_AMBULATORY_CARE_PROVIDER_SITE_OTHER): Payer: Medicare Other | Admitting: Orthopedic Surgery

## 2018-04-05 ENCOUNTER — Encounter: Payer: Self-pay | Admitting: Orthopedic Surgery

## 2018-04-05 VITALS — BP 142/80 | HR 81 | Ht 68.0 in | Wt 225.0 lb

## 2018-04-05 DIAGNOSIS — M23321 Other meniscus derangements, posterior horn of medial meniscus, right knee: Secondary | ICD-10-CM

## 2018-04-05 NOTE — Patient Instructions (Signed)
Meniscus Injury, Arthroscopy Arthroscopy is a surgical procedure that involves the use of a small scope that has a camera and surgical instruments on the end (arthroscope). An arthroscope can be used to repair your meniscus injury.  LET YOUR HEALTH CARE PROVIDER KNOW ABOUT:  Any allergies you have.  All medicines you are taking, including vitamins, herbs, eyedrops, creams, and over-the-counter medicines.  Any recent colds or infections you have had or currently have.  Previous problems you or members of your family have had with the use of anesthetics.  Any blood disorders or blood clotting problems you have.  Previous surgeries you have had.  Medical conditions you have. RISKS AND COMPLICATIONS Generally, this is a safe procedure. However, as with any procedure, problems can occur. Possible problems include:  Damage to nerves or blood vessels.  Excess bleeding.  Blood clots.  Infection. BEFORE THE PROCEDURE  Do not eat or drink for 6-8 hours before the procedure.  Take medicines as directed by your surgeon. Ask your surgeon about changing or stopping your regular medicines.  You may have lab tests the morning of surgery. PROCEDURE  You will be given one of the following:   A medicine that numbs the area (local anesthesia).  A medicine that makes you go to sleep (general anesthesia).  A medicine injected into your spine that numbs your body below the waist (spinal anesthesia). Most often, several small cuts (incisions) are made in the knee. The arthroscope and instruments go into the incisions to repair the damage. The torn portion of the meniscus is removed.   AFTER THE PROCEDURE  You will be taken to the recovery area where your progress will be monitored. When you are awake, stable, and taking fluids without complications, you will be allowed to go home. This is usually the same day. A torn or stretched ligament (ligament sprain) may take 6-8 weeks to heal.   It  takes about the 4-6 WEEKS if your surgeon removed a torn meniscus.  A repaired meniscus may require 6-12 weeks of recovery time.  A torn ligament needing reconstructive surgery may take 6-12 months to heal fully.   This information is not intended to replace advice given to you by your health care provider. Make sure you discuss any questions you have with your health care provider. You have decided to proceed with operative arthroscopy of the knee. You have decided not to continue with nonoperative measures such as but not limited to oral medication, weight loss, activity modification, physical therapy, bracing, or injection.  We will perform operative arthroscopy of the knee. Some of the risks associated with arthroscopic surgery of the knee include but are not limited to Bleeding Infection Swelling Stiffness Blood clot Pain  If you're not comfortable with these risks and would like to continue with nonoperative treatment please let Dr. Harrison know prior to your surgery.   Document Released: 10/09/2000 Document Revised: 10/17/2013 Document Reviewed: 03/10/2013 Elsevier Interactive Patient Education 2016 [employer] You have decided to proceed with operative arthroscopy of the knee. You have decided not to continue with nonoperative measures such as but not limited to oral medication, weight loss, activity modification, physical therapy, bracing, or injection.  In compliance with recent [place] law in federal regulation regarding opioid use and abuse and addiction, we will taper (stop) opioid medication after 2 weeks.  We will perform operative arthroscopy of the knee. Some of the risks associated with arthroscopic surgery of the knee include but are not limited to   Bleeding Infection Swelling Stiffness Blood clot Pain  If you're not comfortable with these risks and would like to continue with nonoperative treatment please let Dr. Harrison know prior to your surgery. 

## 2018-04-05 NOTE — Progress Notes (Signed)
PREOP   Chief Complaint  Patient presents with  . Knee Pain    right / better after injection but still has some pain     MEDICAL DECISION SECTION  xrays ordered? n0  My independent reading of xrays: Prior x-rays were taken.  No acute fracture dislocations noted     Encounter Diagnosis  Name Primary?  . Derangement of posterior horn of medial meniscus of right knee Yes     PLAN:   Nonoperative treatment included opioid therapy and injection.  Patient did improve but he says he cannot go on his trip if his knee was like this.  He has trouble climbing the steps and he still has the medial knee pain.  He says he is altered his activities to help with his giving way symptoms.  We both decided to proceed with surgery so that he can rehab would be ready for his trip to [place] in December  Surgical procedure planned: Arthroscopy right knee partial medial meniscectomy  The procedure has been fully reviewed with the patient; The risks and benefits of surgery have been discussed and explained and understood. Alternative treatment has also been reviewed, questions were encouraged and answered. The postoperative plan is also been reviewed.  Nonsurgical treatment as described in the history and physical section was attempted and unsuccessful and the patient has agreed to proceed with surgical intervention to improve their situation.  No orders of the defined types were placed in this encounter.      Chief Complaint  Patient presents with  . Knee Pain    right / better after injection but still has some pain     [name] comes in after his trip and after the injection right knee for medial knee pain  He says his knee is better but he still having trouble with it.  He presented to [place] about a week ago with the following history 67 year old male history of meniscal tear diagnosed and confirmed with MRI 6 to 7 years ago presents with a new twisting injury to the right knee a month or 2  ago.  Since that time his knee is worsened and he presents with moderately severe dull aching medial knee pain along the right knee medial joint line now associated with pain in his right hip and he has a history of spinal stenosis and the limping is making that worse.  Mild swelling of the joint mild loss of motion especially flexion and painful extension is noted.  He has a history of chronic kidney disease and cannot take anti-inflammatories    Review of Systems  Respiratory: Negative.   Cardiovascular: Negative.   Musculoskeletal: Positive for back pain and joint pain.  Neurological: Negative.      Past Medical History:  Diagnosis Date  . Arthritis    spinal stenosis  . GERD (gastroesophageal reflux disease)   . Hyperlipidemia   . Hypertension   . Kidney function abnormal   . 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 Sleep apnea    uses Cpap    Past Surgical History:  Procedure Laterality Date  . CATARACT EXTRACTION     10-15 years ago  . COLONOSCOPY  05/25/2012   Procedure: COLONOSCOPY;  Surgeon: Rogene Houston, MD;  Location: [facility];  Service: Endoscopy;  Laterality: N/A;  1030  . COLONOSCOPY N/A 05/14/2017   Procedure: COLONOSCOPY;  Surgeon: Rogene Houston, MD;  Location: [facility];  Service: Endoscopy;  Laterality:  N/A;  10:10  . CYSTOSCOPY/RETROGRADE/URETEROSCOPY Right 06/07/2014   Procedure: CYSTOSCOPY, RIGHT RETROGRADE, RIGHT URETEROSCOPY, RIGHT URETERAL BALLOON DILATATION, BIOPSY, RIGHT URETERAL STENT;  Surgeon: Jorja Loa, MD;  Location: [facility];  Service: Urology;  Laterality: Right;  . kidney stone  1983  . left foot surgery  2010    Family History  Problem Relation Age of Onset  . Cancer Father   . Cancer Brother    Social History   Tobacco Use  . Smoking status: Current Some Day Smoker    Types: Cigars  . Smokeless tobacco: Never Used  Substance Use Topics  . Alcohol use: No    Comment: rare  . Drug  use: No    No Known Allergies   Current Meds  Medication Sig  . amitriptyline (ELAVIL) 10 MG tablet Take 10 mg by mouth.  Marland Kitchen amitriptyline (ELAVIL) 25 MG tablet Take 25 mg by mouth at bedtime.  Marland Kitchen aspirin 81 MG chewable tablet Chew 81 mg by mouth.  . gabapentin (NEURONTIN) 600 MG tablet Take 0.5-1 tablets (300-600 mg total) by mouth at bedtime.  . hydrochlorothiazide (MICROZIDE) 12.5 MG capsule Take 12.5 mg by mouth daily.  . Multiple Vitamin (MULTIVITAMIN) capsule Take by mouth.  . pramipexole (MIRAPEX) 0.5 MG tablet Take one or two at bedtime  . pravastatin (PRAVACHOL) 40 MG tablet Take 40 mg by mouth every morning.  . psyllium (METAMUCIL SMOOTH TEXTURE) 28 % packet Take 1 packet by mouth at bedtime.  . traMADol (ULTRAM) 50 MG tablet Take 50 mg by mouth every 6 (six) hours as needed for moderate pain.   Marland Kitchen zolpidem (AMBIEN) 10 MG tablet Take 10 mg by mouth at bedtime.    BP (!) 142/80   Pulse 81   Ht 5\' 8"  (1.727 m)   Wt 225 lb (102.1 kg)   BMI 34.21 kg/m   Physical Exam  Constitutional: He is oriented to person, place, and time. He appears well-developed and well-nourished. No distress.  HENT:  Head: Atraumatic.  Right Ear: External ear normal.  Left Ear: External ear normal.  Nose: No rhinorrhea.  Mouth/Throat: Oropharynx is clear and moist and mucous membranes are normal. No oropharyngeal exudate.  Eyes: Pupils are equal, round, and reactive to light. Conjunctivae and EOM are normal. Right eye exhibits no discharge. Left eye exhibits no discharge. No scleral icterus.  Cardiovascular: Normal rate, regular rhythm and intact distal pulses. PMI is not displaced.  Lymphadenopathy:       Right: No supraclavicular adenopathy present.       Left: No supraclavicular adenopathy present.  Neurological: He is alert and oriented to person, place, and time. He has normal strength. He displays normal reflexes. No cranial nerve deficit or sensory deficit. He exhibits normal muscle tone.  Coordination normal.  Skin: Skin is warm, dry and intact. Capillary refill takes less than 2 seconds. No rash noted. Rash is not macular, not papular, not maculopapular, not nodular and not pustular. No cyanosis. Nails show no clubbing.  Psychiatric: He has a normal mood and affect. His speech is normal and behavior is normal. Judgment and thought content normal. Cognition and memory are normal. He is attentive.  Vitals reviewed.   Ortho Exam  Repeat examination today of his right knee revealed medial joint line tenderness with normal alignment there is no effusion We did fine tenderness in the medial joint line especially the posterior medial corner, positive McMurray's.  Positive screw home test. Strength normal ligaments stable Normal neurovascular  exam no problems with the skin  His other extremities are normal  Arther Abbott, MD 04/05/2018 10:03 AM

## 2018-04-08 NOTE — Patient Instructions (Signed)
[name]  04/08/2018     @PREFPERIOPPHARMACY @   Your procedure is scheduled on  04/14/2018   Report to [facility] at 1130   A.M.  Call this number if you have problems the morning of surgery:  [phone]   Remember:  Do not eat or drink after midnight.  You may drink clear liquids until  12 midnight 04/13/2018 .  Clear liquids allowed are:                    Water, Juice (non-citric and without pulp), Carbonated beverages, Clear Tea, Black Coffee only, Plain Jell-O only, Gatorade and Plain Popsicles only    Take these medicines the morning of surgery with A SIP OF WATER  Ultram.    Do not wear jewelry, make-up or nail polish.  Do not wear lotions, powders, or perfumes, or deodorant.  Do not shave 48 hours prior to surgery.  Men may shave face and neck.  Do not bring valuables to the hospital.  [facility] is not responsible for any belongings or valuables.  Contacts, dentures or bridgework may not be worn into surgery.  Leave your suitcase in the car.  After surgery it may be brought to your room.  For patients admitted to the hospital, discharge time will be determined by your treatment team.  Patients discharged the day of surgery will not be allowed to drive home.   Name and phone number of your driver:   family Special instructions:  None  Please read over the following fact sheets that you were given. Anesthesia Post-op Instructions and Care and Recovery After Surgery      Knee Ligament Injury, Arthroscopy Arthroscopy is a surgical technique in which your health care provider examines your knee through a small, pencil-sized telescope (arthroscope). Often, repairs to injured ligaments can be done with instruments in the arthroscope. Arthroscopy is less invasive than open-knee surgery. Tell a health care provider about:  Any allergies you have.  All medicines you are taking, including vitamins, herbs, eye drops, creams, and over-the-counter  medicines.  Any problems you or family members have had with anesthetic medicines.  Any blood disorders you have.  Any surgeries you have had.  Any medical conditions you have. What are the risks? Generally, this is a safe procedure. However, as with any procedure, problems can occur. Possible problems include:  Infection.  Bleeding.  Stiffness.  What happens before the procedure?  Ask your health care provider about changing or stopping any regular medicines. Avoid taking aspirin or blood thinners as directed by your health care provider.  Do not eat or drink anything after midnight the night before surgery.  If you smoke, do not smoke for at least 2 weeks before your surgery.  Do not drink alcohol starting the day before your surgery.  Let your health care provider know if you develop a cold or any infection before your surgery.  Arrange for someone to drive you home after the surgery or after your hospital stay. Also arrange for someone to help you with activities during recovery. What happens during the procedure?  Small monitors will be put on your body. They are used to check your heart, blood pressure, and oxygen levels.  An IV access tube will be put into one of your veins. Medicine will be able to flow directly into your body through this IV tube.  You might be given a medicine to  help you relax (sedative).  You will be given a medicine that makes you go to sleep (general anesthetic), and a breathing tube will be placed into your lungs during the procedure.  Several small incisions are made in your knee. Saline fluid is placed into one of the incisions to expand the knee and clear away any blood in the knee.  Your health care provider will insert the arthroscope to examine the injured knee.  During arthroscopy, your health care provider may find a partial or complete tear in a ligament.  Tools can be inserted through the other incisions to repair the injured  ligaments.  The incisions are then closed with absorbable stitches and covered with dressings. What happens after the procedure?  You will be taken to the recovery area where you will be monitored.  When you are awake, stable, and taking fluids without problems, you will be allowed to go home. This information is not intended to replace advice given to you by your health care provider. Make sure you discuss any questions you have with your health care provider. Document Released: 10/09/2000 Document Revised: 03/19/2016 Document Reviewed: 05/24/2013 Elsevier Interactive Patient Education  2017 [place].  Arthroscopic Knee Ligament Repair, Care After This sheet gives you information about how to care for yourself after your procedure. Your health care provider may also give you more specific instructions. If you have problems or questions, contact your health care provider. What can I expect after the procedure? After the procedure, it is common to have:  Pain in your knee.  Bruising and swelling on your knee, calf, and ankle for 3-4 days.  Fatigue.  Follow these instructions at home: If you have a brace or immobilizer:  Wear the brace or immobilizer as told by your health care provider. Remove it only as told by your health care provider.  Loosen the splint or immobilizer if your toes tingle, become numb, or turn cold and blue.  Keep the brace or immobilizer clean. Bathing  Do not take baths, swim, or use a hot tub until your health care provider approves. Ask your health care provider if you can take showers.  Keep your bandage (dressing) dry until your health care provider says that it can be removed. Cover it and your brace or immobilizer with a watertight covering when you take a shower. Incision care  Follow instructions from your health care provider about how to take care of your incision. Make sure you: ? Wash your hands with soap and water before you change your  bandage (dressing). If soap and water are not available, use hand sanitizer. ? Change your dressing as told by your health care provider. ? Leave stitches (sutures), skin glue, or adhesive strips in place. These skin closures may need to stay in place for 2 weeks or longer. If adhesive strip edges start to loosen and curl up, you may trim the loose edges. Do not remove adhesive strips completely unless your health care provider tells you to do that.  Check your incision area every day for signs of infection. Check for: ? More redness, swelling, or pain. ? More fluid or blood. ? Warmth. ? Pus or a bad smell. Managing pain, stiffness, and swelling  If directed, put ice on the affected area. ? If you have a removable brace or immobilizer, remove it as told by your health care provider. ? Put ice in a plastic bag. ? Place a towel between your skin and the bag or between  your brace or immobilizer and the bag. ? Leave the ice on for 20 minutes, 2-3 times a day.  Move your toes often to avoid stiffness and to lessen swelling.  Raise (elevate) the injured area above the level of your heart while you are sitting or lying down. Driving  Do not drive until your health care provider approves. If you have a brace or immobilizer on your leg, ask your health care provider when it is safe for you to drive.  Do not drive or use heavy machinery while taking prescription pain medicine. Activity  Rest as directed. Ask your health care provider what activities are safe for you.  Do physical therapy exercises as told by your health care provider. Physical therapy will help you regain strength and motion in your knee.  Follow instructions from your health care provider about: ? When you may start motion exercises. ? When you may start riding a stationary bike and doing other low-impact activities. ? When you may start to jog and do other high-impact activities. Safety  Do not use the injured limb to  support your body weight until your health care provider says that you can. Use crutches as told by your health care provider. General instructions  Do not use any products that contain nicotine or tobacco, such as cigarettes and e-cigarettes. These can delay bone healing. If you need help quitting, ask your health care provider.  To prevent or treat constipation while you are taking prescription pain medicine, your health care provider may recommend that you: ? Drink enough fluid to keep your urine clear or pale yellow. ? Take over-the-counter or prescription medicines. ? Eat foods that are high in fiber, such as fresh fruits and vegetables, whole grains, and beans. ? Limit foods that are high in fat and processed sugars, such as fried and sweet foods.  Take over-the-counter and prescription medicines only as told by your health care provider.  Keep all follow-up visits as told by your health care provider. This is important. Contact a health care provider if:  You have more redness, swelling, or pain around an incision.  You have more fluid or blood coming from an incision.  Your incision feels warm to the touch.  You have a fever.  You have pain or swelling in your knee, and it gets worse.  You have pain that does not get better with medicine. Get help right away if:  You have trouble breathing.  You have pus or a bad smell coming from an incision.  You have numbness and tingling near the knee joint. Summary  After the procedure, it is common to have knee pain with bruising and swelling on your knee, calf, and ankle.  Icing your knee and raising your leg above the level of your heart will help control the pain and the swelling.  Do physical therapy exercises as told by your health care provider. Physical therapy will help you regain strength and motion in your knee. This information is not intended to replace advice given to you by your health care provider. Make sure you  discuss any questions you have with your health care provider. Document Released: 08/02/2013 Document Revised: 10/06/2016 Document Reviewed: 10/06/2016 Elsevier Interactive Patient Education  2017 [place] Anesthesia, Adult General anesthesia is the use of medicines to make a person "go to sleep" (be unconscious) for a medical procedure. General anesthesia is often recommended when a procedure:  Is long.  Requires you to be still  or in an unusual position.  Is major and can cause you to lose blood.  Is impossible to do without general anesthesia.  The medicines used for general anesthesia are called general anesthetics. In addition to making you sleep, the medicines:  Prevent pain.  Control your blood pressure.  Relax your muscles.  Tell a health care provider about:  Any allergies you have.  All medicines you are taking, including vitamins, herbs, eye drops, creams, and over-the-counter medicines.  Any problems you or family members have had with anesthetic medicines.  Types of anesthetics you have had in the past.  Any bleeding disorders you have.  Any surgeries you have had.  Any medical conditions you have.  Any history of heart or lung conditions, such as heart failure, sleep apnea, or chronic obstructive pulmonary disease (COPD).  Whether you are pregnant or may be pregnant.  Whether you use tobacco, alcohol, marijuana, or street drugs.  Any history of Armed forces logistics/support/administrative officer.  Any history of depression or anxiety. What are the risks? Generally, this is a safe procedure. However, problems may occur, including:  Allergic reaction to anesthetics.  Lung and heart problems.  Inhaling food or liquids from your stomach into your lungs (aspiration).  Injury to nerves.  Waking up during your procedure and being unable to move (rare).  Extreme agitation or a state of mental confusion (delirium) when you wake up from the anesthetic.  Air in the  bloodstream, which can lead to stroke.  These problems are more likely to develop if you are having a major surgery or if you have an advanced medical condition. You can prevent some of these complications by answering all of your health care provider's questions thoroughly and by following all pre-procedure instructions. General anesthesia can cause side effects, including:  Nausea or vomiting  A sore throat from the breathing tube.  Feeling cold or shivery.  Feeling tired, washed out, or achy.  Sleepiness or drowsiness.  Confusion or agitation.  What happens before the procedure? Staying hydrated Follow instructions from your health care provider about hydration, which may include:  Up to 2 hours before the procedure - you may continue to drink clear liquids, such as water, clear fruit juice, black coffee, and plain tea.  Eating and drinking restrictions Follow instructions from your health care provider about eating and drinking, which may include:  8 hours before the procedure - stop eating heavy meals or foods such as meat, fried foods, or fatty foods.  6 hours before the procedure - stop eating light meals or foods, such as toast or cereal.  6 hours before the procedure - stop drinking milk or drinks that contain milk.  2 hours before the procedure - stop drinking clear liquids.  Medicines  Ask your health care provider about: ? Changing or stopping your regular medicines. This is especially important if you are taking diabetes medicines or blood thinners. ? Taking medicines such as aspirin and ibuprofen. These medicines can thin your blood. Do not take these medicines before your procedure if your health care provider instructs you not to. ? Taking new dietary supplements or medicines. Do not take these during the week before your procedure unless your health care provider approves them.  If you are told to take a medicine or to continue taking a medicine on the day of  the procedure, take the medicine with sips of water. General instructions   Ask if you will be going home the same day, the following day,  or after a longer hospital stay. ? Plan to have someone take you home. ? Plan to have someone stay with you for the first 24 hours after you leave the hospital or clinic.  For 3-6 weeks before the procedure, try not to use any tobacco products, such as cigarettes, chewing tobacco, and e-cigarettes.  You may brush your teeth on the morning of the procedure, but make sure to spit out the toothpaste. What happens during the procedure?  You will be given anesthetics through a mask and through an IV tube in one of your veins.  You may receive medicine to help you relax (sedative).  As soon as you are asleep, a breathing tube may be used to help you breathe.  An anesthesia specialist will stay with you throughout the procedure. He or she will help keep you comfortable and safe by continuing to give you medicines and adjusting the amount of medicine that you get. He or she will also watch your blood pressure, pulse, and oxygen levels to make sure that the anesthetics do not cause any problems.  If a breathing tube was used to help you breathe, it will be removed before you wake up. The procedure may vary among health care providers and hospitals. What happens after the procedure?  You will wake up, often slowly, after the procedure is complete, usually in a recovery area.  Your blood pressure, heart rate, breathing rate, and blood oxygen level will be monitored until the medicines you were given have worn off.  You may be given medicine to help you calm down if you feel anxious or agitated.  If you will be going home the same day, your health care provider may check to make sure you can stand, drink, and urinate.  Your health care providers will treat your pain and side effects before you go home.  Do not drive for 24 hours if you received a  sedative.  You may: ? Feel nauseous and vomit. ? Have a sore throat. ? Have mental slowness. ? Feel cold or shivery. ? Feel sleepy. ? Feel tired. ? Feel sore or achy, even in parts of your body where you did not have surgery. This information is not intended to replace advice given to you by your health care provider. Make sure you discuss any questions you have with your health care provider. Document Released: 01/19/2008 Document Revised: 03/24/2016 Document Reviewed: 09/26/2015 Elsevier Interactive Patient Education  2018 [place] Anesthesia, Adult, Care After These instructions provide you with information about caring for yourself after your procedure. Your health care provider may also give you more specific instructions. Your treatment has been planned according to current medical practices, but problems sometimes occur. Call your health care provider if you have any problems or questions after your procedure. What can I expect after the procedure? After the procedure, it is common to have:  Vomiting.  A sore throat.  Mental slowness.  It is common to feel:  Nauseous.  Cold or shivery.  Sleepy.  Tired.  Sore or achy, even in parts of your body where you did not have surgery.  Follow these instructions at home: For at least 24 hours after the procedure:  Do not: ? Participate in activities where you could fall or become injured. ? Drive. ? Use heavy machinery. ? Drink alcohol. ? Take sleeping pills or medicines that cause drowsiness. ? Make important decisions or sign legal documents. ? Take care of children on your own.  Rest. Eating and drinking  If you vomit, drink water, juice, or soup when you can drink without vomiting.  Drink enough fluid to keep your urine clear or pale yellow.  Make sure you have little or no nausea before eating solid foods.  Follow the diet recommended by your health care provider. General instructions  Have a  responsible adult stay with you until you are awake and alert.  Return to your normal activities as told by your health care provider. Ask your health care provider what activities are safe for you.  Take over-the-counter and prescription medicines only as told by your health care provider.  If you smoke, do not smoke without supervision.  Keep all follow-up visits as told by your health care provider. This is important. Contact a health care provider if:  You continue to have nausea or vomiting at home, and medicines are not helpful.  You cannot drink fluids or start eating again.  You cannot urinate after 8-12 hours.  You develop a skin rash.  You have fever.  You have increasing redness at the site of your procedure. Get help right away if:  You have difficulty breathing.  You have chest pain.  You have unexpected bleeding.  You feel that you are having a life-threatening or urgent problem. This information is not intended to replace advice given to you by your health care provider. Make sure you discuss any questions you have with your health care provider. Document Released: 01/18/2001 Document Revised: 03/16/2016 Document Reviewed: 09/26/2015 Elsevier Interactive Patient Education  [employer].

## 2018-04-13 ENCOUNTER — Other Ambulatory Visit: Payer: Self-pay

## 2018-04-13 ENCOUNTER — Encounter (HOSPITAL_COMMUNITY)
Admission: RE | Admit: 2018-04-13 | Discharge: 2018-04-13 | Disposition: A | Discharge status: Home / Self Care | Payer: Medicare Other | Source: Ambulatory Visit | Attending: Orthopedic Surgery | Admitting: Orthopedic Surgery

## 2018-04-13 ENCOUNTER — Telehealth: Payer: Self-pay | Admitting: Orthopedic Surgery

## 2018-04-13 ENCOUNTER — Encounter (HOSPITAL_COMMUNITY): Payer: Self-pay

## 2018-04-13 DIAGNOSIS — M4800 Spinal stenosis, site unspecified: Secondary | ICD-10-CM | POA: Diagnosis not present

## 2018-04-13 DIAGNOSIS — X501XXA Overexertion from prolonged static or awkward postures, initial encounter: Secondary | ICD-10-CM | POA: Diagnosis not present

## 2018-04-13 DIAGNOSIS — I129 Hypertensive chronic kidney disease with stage 1 through stage 4 chronic kidney disease, or unspecified chronic kidney disease: Secondary | ICD-10-CM | POA: Diagnosis not present

## 2018-04-13 DIAGNOSIS — M23321 Other meniscus derangements, posterior horn of medial meniscus, right knee: Secondary | ICD-10-CM

## 2018-04-13 DIAGNOSIS — G4730 Sleep apnea, unspecified: Secondary | ICD-10-CM | POA: Diagnosis not present

## 2018-04-13 DIAGNOSIS — M94261 Chondromalacia, right knee: Secondary | ICD-10-CM | POA: Diagnosis not present

## 2018-04-13 DIAGNOSIS — Z87442 Personal history of urinary calculi: Secondary | ICD-10-CM | POA: Diagnosis not present

## 2018-04-13 DIAGNOSIS — N189 Chronic kidney disease, unspecified: Secondary | ICD-10-CM | POA: Diagnosis not present

## 2018-04-13 DIAGNOSIS — M1990 Unspecified osteoarthritis, unspecified site: Secondary | ICD-10-CM | POA: Diagnosis not present

## 2018-04-13 DIAGNOSIS — S83241A Other tear of medial meniscus, current injury, right knee, initial encounter: Secondary | ICD-10-CM | POA: Diagnosis not present

## 2018-04-13 HISTORY — DX: Anemia, unspecified: D64.9

## 2018-04-13 HISTORY — DX: Personal history of urinary calculi: Z87.442

## 2018-04-13 LAB — BASIC METABOLIC PANEL
ANION GAP: 7 (ref 5–15)
BUN: 17 mg/dL (ref 6–20)
CALCIUM: 9 mg/dL (ref 8.9–10.3)
CO2: 28 mmol/L (ref 22–32)
Chloride: 106 mmol/L (ref 101–111)
Creatinine, Ser: 1.54 mg/dL — ABNORMAL HIGH (ref 0.61–1.24)
GFR calc non Af Amer: 45 mL/min — ABNORMAL LOW (ref >60)
GFR, EST AFRICAN AMERICAN: 52 mL/min — AB (ref >60)
Glucose, Bld: 118 mg/dL — ABNORMAL HIGH (ref 65–99)
POTASSIUM: 3.8 mmol/L (ref 3.5–5.1)
Sodium: 141 mmol/L (ref 135–145)

## 2018-04-13 LAB — CBC WITH DIFFERENTIAL/PLATELET
BASOS ABS: 0 10*3/uL (ref 0.0–0.1)
BASOS PCT: 1 %
Eosinophils Absolute: 0.3 10*3/uL (ref 0.0–0.7)
Eosinophils Relative: 4 %
HEMATOCRIT: 40.8 % (ref 39.0–52.0)
HEMOGLOBIN: 13.3 g/dL (ref 13.0–17.0)
LYMPHS PCT: 26 %
Lymphs Abs: 1.6 10*3/uL (ref 0.7–4.0)
MCH: 29.6 pg (ref 26.0–34.0)
MCHC: 32.6 g/dL (ref 30.0–36.0)
MCV: 90.9 fL (ref 78.0–100.0)
Monocytes Absolute: 0.5 10*3/uL (ref 0.1–1.0)
Monocytes Relative: 8 %
NEUTROS ABS: 3.9 10*3/uL (ref 1.7–7.7)
NEUTROS PCT: 61 %
Platelets: 207 10*3/uL (ref 150–400)
RBC: 4.49 MIL/uL (ref 4.22–5.81)
RDW: 12.4 % (ref 11.5–15.5)
WBC: 6.3 10*3/uL (ref 4.0–10.5)

## 2018-04-13 NOTE — H&P (Signed)
[name] is an 67 y.o. male.   Chief Complaint: Right knee pain HPI: 67 year old male history of meniscal tear diagnosed confirmed with MRI 7 years ago presented on May 29 with a new twisting injury to the right knee approximately 2 months prior.  He noticed worsening knee pain with moderately severe dull aching pain medial joint line which causes right hip to hurt.  He has a history of spinal stenosis and is making that worse  After injection he went on a trip he came back his knee was slightly better but not improved where he could perform his activities of daily living so he presents now for arthroscopy of the knee and medial meniscectomy  He has a history of chronic kidney disease and cannot take anti-inflammatories  Past Medical History:  Diagnosis Date  . Anemia   . Arthritis    spinal stenosis  . History of kidney stones   . Hyperlipidemia   . Hypertension   . Kidney function abnormal   . 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 Sleep apnea    uses Cpap    Past Surgical History:  Procedure Laterality Date  . CATARACT EXTRACTION     10-15 years ago  . COLONOSCOPY  05/25/2012   Procedure: COLONOSCOPY;  Surgeon: Rogene Houston, MD;  Location: [facility];  Service: Endoscopy;  Laterality: N/A;  1030  . COLONOSCOPY N/A 05/14/2017   Procedure: COLONOSCOPY;  Surgeon: Rogene Houston, MD;  Location: [facility];  Service: Endoscopy;  Laterality: N/A;  10:10  . CYSTOSCOPY/RETROGRADE/URETEROSCOPY Right 06/07/2014   Procedure: CYSTOSCOPY, RIGHT RETROGRADE, RIGHT URETEROSCOPY, RIGHT URETERAL BALLOON DILATATION, BIOPSY, RIGHT URETERAL STENT;  Surgeon: Jorja Loa, MD;  Location: [facility];  Service: Urology;  Laterality: Right;  . kidney stone  1983  . left foot surgery  2010   repair of joint    Family History  Problem Relation Age of Onset  . Cancer Father   . Cancer Brother    Social History:  reports that he has never smoked. He has  never used smokeless tobacco. He reports that he does not drink alcohol or use drugs.  Allergies: No Known Allergies  No medications prior to admission.    Results for orders placed or performed during the hospital encounter of 04/13/18 (from the past 48 hour(s))  Basic metabolic panel     Status: Abnormal   Collection Time: 04/13/18  8:22 AM  Result Value Ref Range   Sodium 141 135 - 145 mmol/L   Potassium 3.8 3.5 - 5.1 mmol/L   Chloride 106 101 - 111 mmol/L   CO2 28 22 - 32 mmol/L   Glucose, Bld 118 (H) 65 - 99 mg/dL   BUN 17 6 - 20 mg/dL   Creatinine, Ser 1.54 (H) 0.61 - 1.24 mg/dL   Calcium 9.0 8.9 - 10.3 mg/dL   GFR calc non Af Amer 45 (L) >60 mL/min   GFR calc Af Amer 52 (L) >60 mL/min    Comment: (NOTE) The eGFR has been calculated using the CKD EPI equation. This calculation has not been validated in all clinical situations. eGFR's persistently <60 mL/min signify possible Chronic Kidney Disease.    Anion gap 7 5 - 15    Comment: Performed at [facility], [address], [place],  [address]  CBC WITH DIFFERENTIAL     Status: None   Collection Time: 04/13/18  8:22 AM  Result Value  Ref Range   WBC 6.3 4.0 - 10.5 K/uL   RBC 4.49 4.22 - 5.81 MIL/uL   Hemoglobin 13.3 13.0 - 17.0 g/dL   HCT 40.8 39.0 - 52.0 %   MCV 90.9 78.0 - 100.0 fL   MCH 29.6 26.0 - 34.0 pg   MCHC 32.6 30.0 - 36.0 g/dL   RDW 12.4 11.5 - 15.5 %   Platelets 207 150 - 400 K/uL   Neutrophils Relative % 61 %   Neutro Abs 3.9 1.7 - 7.7 K/uL   Lymphocytes Relative 26 %   Lymphs Abs 1.6 0.7 - 4.0 K/uL   Monocytes Relative 8 %   Monocytes Absolute 0.5 0.1 - 1.0 K/uL   Eosinophils Relative 4 %   Eosinophils Absolute 0.3 0.0 - 0.7 K/uL   Basophils Relative 1 %   Basophils Absolute 0.0 0.0 - 0.1 K/uL    Comment: Performed at [facility], [address] [place], [place] [address]   No results found.  ROS We know no fever your nose and throat normal no shortness of breath or chest pain  He  does have back pain and joint pain skin is negative he denies tingling tremors or sensory changes remaining review of systems was negative There were no vitals taken for this visit. Physical Exam  In the office the vital signs were blood pressure 146/90 pulse 97 weight  225 pounds height 5 8 BMI was 34.21  He is oriented to person place and time he appears well-developed and well-nourished no distress  Normocephalic atraumatic external ears are normal nose is normal eyes pupils were equal round reactive to light conjunctiva were normal extraocular muscles were normal right eye exhibits no discharge left eye exhibits no discharge  Neck normal range of motion and supple no tracheal deviation thyromegaly present cardiovascular normal rate and rhythm intact distal pulses  Chest S effort was normal no respiratory distress  Right knee had a fusion  Skin was normal without scars sensation was intact pulses were good there is no peripheral edema  His McMurray sign was positive on the medial test normal on the lateral test no varus valgus instability drawer test were normal he had normal extension and flexion he had tenderness over the medial joint line but normal strength  Left knee exam range of motion stability strength normal no tenderness no swelling left leg normal skin pulse was normal no swelling no scars Assessment/Plan Diagnosis derangement posterior horn medial meniscus right knee  Plan arthroscopy partial medial meniscectomy right knee  Arther Abbott, MD 04/13/2018, 1:02 PM

## 2018-04-13 NOTE — Telephone Encounter (Signed)
I thought he had one already. I have printed another and faxed for him. He will pick up at [facility].

## 2018-04-13 NOTE — Telephone Encounter (Signed)
Patient went to his pre op this morning and they told him to contact our office for a prescription for either a cane, walker or crutches. Whichever Dr. Aline Brochure thinks is best for him.  Please call and advise

## 2018-04-14 ENCOUNTER — Ambulatory Visit (HOSPITAL_COMMUNITY): Payer: Medicare Other | Admitting: Anesthesiology

## 2018-04-14 ENCOUNTER — Encounter (HOSPITAL_COMMUNITY)
Admission: RE | Disposition: A | Discharge status: Home / Self Care | Payer: Self-pay | Source: Ambulatory Visit | Attending: Orthopedic Surgery

## 2018-04-14 ENCOUNTER — Encounter (HOSPITAL_COMMUNITY): Payer: Self-pay | Admitting: *Deleted

## 2018-04-14 ENCOUNTER — Ambulatory Visit (HOSPITAL_COMMUNITY)
Admission: RE | Admit: 2018-04-14 | Discharge: 2018-04-14 | Disposition: A | Discharge status: Home / Self Care | Payer: Medicare Other | Source: Ambulatory Visit | Attending: Orthopedic Surgery | Admitting: Orthopedic Surgery

## 2018-04-14 DIAGNOSIS — Z87442 Personal history of urinary calculi: Secondary | ICD-10-CM | POA: Insufficient documentation

## 2018-04-14 DIAGNOSIS — M23321 Other meniscus derangements, posterior horn of medial meniscus, right knee: Secondary | ICD-10-CM | POA: Diagnosis not present

## 2018-04-14 DIAGNOSIS — Z9889 Other specified postprocedural states: Secondary | ICD-10-CM

## 2018-04-14 DIAGNOSIS — M94261 Chondromalacia, right knee: Secondary | ICD-10-CM | POA: Insufficient documentation

## 2018-04-14 DIAGNOSIS — M4800 Spinal stenosis, site unspecified: Secondary | ICD-10-CM | POA: Diagnosis not present

## 2018-04-14 DIAGNOSIS — M1990 Unspecified osteoarthritis, unspecified site: Secondary | ICD-10-CM | POA: Insufficient documentation

## 2018-04-14 DIAGNOSIS — G4730 Sleep apnea, unspecified: Secondary | ICD-10-CM | POA: Diagnosis not present

## 2018-04-14 DIAGNOSIS — I129 Hypertensive chronic kidney disease with stage 1 through stage 4 chronic kidney disease, or unspecified chronic kidney disease: Secondary | ICD-10-CM | POA: Diagnosis not present

## 2018-04-14 DIAGNOSIS — N189 Chronic kidney disease, unspecified: Secondary | ICD-10-CM | POA: Insufficient documentation

## 2018-04-14 DIAGNOSIS — S83241A Other tear of medial meniscus, current injury, right knee, initial encounter: Secondary | ICD-10-CM | POA: Insufficient documentation

## 2018-04-14 DIAGNOSIS — X501XXA Overexertion from prolonged static or awkward postures, initial encounter: Secondary | ICD-10-CM | POA: Insufficient documentation

## 2018-04-14 HISTORY — PX: KNEE ARTHROSCOPY WITH MEDIAL MENISECTOMY: SHX5651

## 2018-04-14 SURGERY — ARTHROSCOPY, KNEE, WITH MEDIAL MENISCECTOMY
Anesthesia: General | Site: Knee | Laterality: Right

## 2018-04-14 MED ORDER — CEFAZOLIN SODIUM-DEXTROSE 2-4 GM/100ML-% IV SOLN
2.0000 g | INTRAVENOUS | Status: AC
  Administered 2018-04-14: 2 g via INTRAVENOUS
  Filled 2018-04-14: qty 100

## 2018-04-14 MED ORDER — SODIUM CHLORIDE 0.9 % IR SOLN
Status: DC | PRN
  Administered 2018-04-14: 1000 mL

## 2018-04-14 MED ORDER — EPINEPHRINE PF 1 MG/ML IJ SOLN
INTRAMUSCULAR | Status: AC
  Filled 2018-04-14: qty 4

## 2018-04-14 MED ORDER — FENTANYL CITRATE (PF) 250 MCG/5ML IJ SOLN
INTRAMUSCULAR | Status: AC
  Filled 2018-04-14: qty 5

## 2018-04-14 MED ORDER — HYDROCODONE-ACETAMINOPHEN 7.5-325 MG PO TABS
ORAL_TABLET | ORAL | Status: AC
  Filled 2018-04-14: qty 1

## 2018-04-14 MED ORDER — KETOROLAC TROMETHAMINE 30 MG/ML IJ SOLN: 30.0000 mg | Freq: Once | INTRAMUSCULAR | Status: DC

## 2018-04-14 MED ORDER — HYDROCODONE-ACETAMINOPHEN 7.5-325 MG PO TABS: 1.0000 | ORAL_TABLET | Freq: Once | ORAL | Status: DC | PRN

## 2018-04-14 MED ORDER — HYDROCODONE-ACETAMINOPHEN 5-325 MG PO TABS: 1.0000 | ORAL_TABLET | ORAL | 0 refills | Status: AC | PRN

## 2018-04-14 MED ORDER — FENTANYL CITRATE (PF) 100 MCG/2ML IJ SOLN: 25.0000 ug | INTRAMUSCULAR | Status: DC | PRN

## 2018-04-14 MED ORDER — PROMETHAZINE HCL 12.5 MG PO TABS: 12.5000 mg | ORAL_TABLET | Freq: Four times a day (QID) | ORAL | 0 refills | Status: DC | PRN

## 2018-04-14 MED ORDER — LACTATED RINGERS IV SOLN
INTRAVENOUS | Status: DC
  Administered 2018-04-14: 1000 mL via INTRAVENOUS

## 2018-04-14 MED ORDER — ONDANSETRON HCL 4 MG/2ML IJ SOLN
4.0000 mg | Freq: Once | INTRAMUSCULAR | Status: AC
  Administered 2018-04-14: 4 mg via INTRAVENOUS

## 2018-04-14 MED ORDER — HYDROCODONE-ACETAMINOPHEN 7.5-325 MG PO TABS
1.0000 | ORAL_TABLET | Freq: Once | ORAL | Status: AC
  Administered 2018-04-14: 1 via ORAL

## 2018-04-14 MED ORDER — SODIUM CHLORIDE 0.9 % IR SOLN
Status: DC | PRN
  Administered 2018-04-14 (×3): 3000 mL

## 2018-04-14 MED ORDER — ONDANSETRON HCL 4 MG/2ML IJ SOLN
INTRAMUSCULAR | Status: AC
  Filled 2018-04-14: qty 2

## 2018-04-14 MED ORDER — PROPOFOL 10 MG/ML IV BOLUS
INTRAVENOUS | Status: DC | PRN
  Administered 2018-04-14: 20 mg via INTRAVENOUS
  Administered 2018-04-14: 200 mg via INTRAVENOUS

## 2018-04-14 MED ORDER — BUPIVACAINE-EPINEPHRINE (PF) 0.5% -1:200000 IJ SOLN
INTRAMUSCULAR | Status: DC | PRN
  Administered 2018-04-14: 60 mL via PERINEURAL

## 2018-04-14 MED ORDER — IBUPROFEN 800 MG PO TABS: 800.0000 mg | ORAL_TABLET | Freq: Once | ORAL | Status: DC

## 2018-04-14 MED ORDER — CHLORHEXIDINE GLUCONATE 4 % EX LIQD: 60.0000 mL | Freq: Once | CUTANEOUS | Status: DC

## 2018-04-14 MED ORDER — BUPIVACAINE-EPINEPHRINE (PF) 0.5% -1:200000 IJ SOLN
INTRAMUSCULAR | Status: AC
  Filled 2018-04-14: qty 60

## 2018-04-14 MED ORDER — FENTANYL CITRATE (PF) 100 MCG/2ML IJ SOLN
INTRAMUSCULAR | Status: DC | PRN
  Administered 2018-04-14: 25 ug via INTRAVENOUS
  Administered 2018-04-14 (×2): 50 ug via INTRAVENOUS

## 2018-04-14 SURGICAL SUPPLY — 54 items
ARTHROWAND PARAGON T2 (SURGICAL WAND)
BANDAGE ELASTIC 6 LF NS (GAUZE/BANDAGES/DRESSINGS) ×3 IMPLANT
BLADE AGGRESSIVE PLUS 4.0 (BLADE) ×3 IMPLANT
BLADE SURG SZ11 CARB STEEL (BLADE) ×3 IMPLANT
BNDG CMPR MED 5X6 ELC HKLP NS (GAUZE/BANDAGES/DRESSINGS) ×1
CHLORAPREP W/TINT 26ML (MISCELLANEOUS) ×3 IMPLANT
CLOTH BEACON ORANGE TIMEOUT ST (SAFETY) ×3 IMPLANT
COOLER CRYO IC GRAV AND TUBE (ORTHOPEDIC SUPPLIES) ×3 IMPLANT
CUFF CRYO KNEE18X23 MED (MISCELLANEOUS) ×2 IMPLANT
CUFF TOURNIQUET SINGLE 34IN LL (TOURNIQUET CUFF) ×2 IMPLANT
CUTTER ANGLED AGGR PLUS 4.0 (BURR) ×2 IMPLANT
CUTTER ANGLED DBL BITE 4.5 (BURR) IMPLANT
DECANTER SPIKE VIAL GLASS SM (MISCELLANEOUS) ×6 IMPLANT
GAUZE SPONGE 4X4 12PLY STRL (GAUZE/BANDAGES/DRESSINGS) ×3 IMPLANT
GAUZE SPONGE 4X4 16PLY XRAY LF (GAUZE/BANDAGES/DRESSINGS) ×3 IMPLANT
GAUZE XEROFORM 5X9 LF (GAUZE/BANDAGES/DRESSINGS) ×3 IMPLANT
GLOVE BIOGEL PI IND STRL 7.0 (GLOVE) ×1 IMPLANT
GLOVE BIOGEL PI INDICATOR 7.0 (GLOVE) ×4
GLOVE ECLIPSE 6.5 STRL STRAW (GLOVE) ×2 IMPLANT
GLOVE SKINSENSE NS SZ8.0 LF (GLOVE) ×2
GLOVE SKINSENSE STRL SZ8.0 LF (GLOVE) ×1 IMPLANT
GLOVE SS N UNI LF 8.5 STRL (GLOVE) ×3 IMPLANT
GOWN STRL REUS W/ TWL LRG LVL3 (GOWN DISPOSABLE) ×1 IMPLANT
GOWN STRL REUS W/TWL LRG LVL3 (GOWN DISPOSABLE) ×3
GOWN STRL REUS W/TWL XL LVL3 (GOWN DISPOSABLE) ×3 IMPLANT
HLDR LEG FOAM (MISCELLANEOUS) ×1 IMPLANT
IV NS IRRIG 3000ML ARTHROMATIC (IV SOLUTION) ×8 IMPLANT
KIT BLADEGUARD II DBL (SET/KITS/TRAYS/PACK) ×3 IMPLANT
KIT TURNOVER CYSTO (KITS) ×3 IMPLANT
LEG HOLDER FOAM (MISCELLANEOUS) ×2
MANIFOLD NEPTUNE II (INSTRUMENTS) ×3 IMPLANT
MARKER SKIN DUAL TIP RULER LAB (MISCELLANEOUS) ×3 IMPLANT
NDL HYPO 18GX1.5 BLUNT FILL (NEEDLE) ×1 IMPLANT
NDL HYPO 21X1.5 SAFETY (NEEDLE) ×1 IMPLANT
NDL SPNL 18GX3.5 QUINCKE PK (NEEDLE) ×1 IMPLANT
NEEDLE HYPO 18GX1.5 BLUNT FILL (NEEDLE) ×3 IMPLANT
NEEDLE HYPO 21X1.5 SAFETY (NEEDLE) ×3 IMPLANT
NEEDLE SPNL 18GX3.5 QUINCKE PK (NEEDLE) ×3 IMPLANT
NS IRRIG 1000ML POUR BTL (IV SOLUTION) ×3 IMPLANT
PACK ARTHRO LIMB DRAPE STRL (MISCELLANEOUS) ×3 IMPLANT
PAD ABD 5X9 TENDERSORB (GAUZE/BANDAGES/DRESSINGS) ×3 IMPLANT
PAD ARMBOARD 7.5X6 YLW CONV (MISCELLANEOUS) ×3 IMPLANT
PADDING CAST COTTON 6X4 STRL (CAST SUPPLIES) ×3 IMPLANT
PROBE BIPOLAR 50 DEGREE SUCT (MISCELLANEOUS) ×2 IMPLANT
PROBE BIPOLAR ATHRO 135MM 90D (MISCELLANEOUS) IMPLANT
SET ARTHROSCOPY INST (INSTRUMENTS) ×3 IMPLANT
SET BASIN LINEN APH (SET/KITS/TRAYS/PACK) ×3 IMPLANT
SUT ETHILON 3 0 FSL (SUTURE) ×3 IMPLANT
SYR 10ML LL (SYRINGE) ×3 IMPLANT
SYR 30ML LL (SYRINGE) ×3 IMPLANT
TUBE CONNECTING 12'X1/4 (SUCTIONS) ×2
TUBE CONNECTING 12X1/4 (SUCTIONS) ×4 IMPLANT
TUBING ARTHRO INFLOW-ONLY STRL (TUBING) ×3 IMPLANT
WAND ARTHRO PARAGON T2 (SURGICAL WAND) IMPLANT

## 2018-04-14 NOTE — Brief Op Note (Addendum)
04/14/2018  1:04 PM  PATIENT:  [name]  67 y.o. male  PRE-OPERATIVE DIAGNOSIS:  torn medial meniscus right knee  POST-OPERATIVE DIAGNOSIS:  torn medial meniscus right knee  PROCEDURE:  Procedure(s) with comments: KNEE ARTHROSCOPY WITH MEDIAL MENISECTOMY (Right) - right on June 20th at 12:45  Operative findings torn medial meniscus posterior horn half of the posterior horn was resected (16% of the meniscus) Grade I chondromalacia medial femoral condyle Normal ACL PCL Normal lateral compartment including meniscus Grade I chondromalacia trochlea and patella  Knee arthroscopy dictation  The patient was identified in the preoperative holding area using 2 approved identification mechanisms. The chart was reviewed and updated. The surgical site was confirmed as right knee and marked with an indelible marker.  The patient was taken to the operating room for anesthesia. After successful general anesthesia, Ancef 2 g was used as IV antibiotics.  The patient was placed in the supine position with the (right) the operative extremity in an arthroscopic leg holder and the opposite extremity in a padded leg holder.  The timeout was executed.  A lateral portal was established with an 11 blade and the scope was introduced into the joint. A diagnostic arthroscopy was performed in circumferential manner examining the entire knee joint. A medial portal was established and the diagnostic arthroscopy was repeated using a probe to palpate intra-articular structures as they were encountered.     The medial meniscus was resected using a duckbill forceps. The meniscal fragments were removed with a motorized shaver. The meniscus was balanced with a combination of a motorized shaver and a 50 ConMed wand until a stable rim was obtained.  The arthroscopic pump was placed on the wash mode and any excess debris was removed from the joint using suction.  60 cc of Marcaine with epinephrine was injected  through the arthroscope.  The portals were closed with 3-0 nylon suture.  A sterile bandage, Ace wrap and Cryo/Cuff was placed and the Cryo/Cuff was activated. The patient was taken to the recovery room in stable condition.  SURGEON:  Surgeon(s) and Role:    * Carole Civil, MD - Primary  PHYSICIAN ASSISTANT:   ASSISTANTS: none   ANESTHESIA:   general  EBL:  0 mL   BLOOD ADMINISTERED:none  DRAINS: none   LOCAL MEDICATIONS USED:  MARCAINE     SPECIMEN:  No Specimen  DISPOSITION OF SPECIMEN:  N/A  COUNTS:  YES  TOURNIQUET:  * Missing tourniquet times found for documented tourniquets in log: [id number] *  DICTATION: .Dragon Dictation  PLAN OF CARE: Discharge to home after PACU  PATIENT DISPOSITION:  PACU - hemodynamically stable.   Delay start of Pharmacological VTE agent (>24hrs) due to surgical blood loss or risk of bleeding: not applicable

## 2018-04-14 NOTE — Anesthesia Preprocedure Evaluation (Signed)
Anesthesia Evaluation  Patient identified by MRN, date of birth, ID band Patient awake    Reviewed: Allergy & Precautions, NPO status , Patient's Chart, lab work & pertinent test results  Airway Mallampati: II  TM Distance: >3 FB Neck ROM: Full    Dental no notable dental hx. (+) Teeth Intact   Pulmonary neg pulmonary ROS, sleep apnea and Continuous Positive Airway Pressure Ventilation ,    Pulmonary exam normal breath sounds clear to auscultation       Cardiovascular Exercise Tolerance: Good hypertension, Pt. on medications negative cardio ROS Normal cardiovascular examI Rhythm:Regular Rate:Normal     Neuro/Psych  Neuromuscular disease negative neurological ROS  negative psych ROS   GI/Hepatic negative GI ROS, Neg liver ROS,   Endo/Other  negative endocrine ROS  Renal/GU Renal InsufficiencyRenal diseasenegative Renal ROS  negative genitourinary   Musculoskeletal negative musculoskeletal ROS (+) Arthritis , Osteoarthritis,    Abdominal   Peds negative pediatric ROS (+)  Hematology negative hematology ROS (+) anemia ,   Anesthesia Other Findings   Reproductive/Obstetrics negative OB ROS                             Anesthesia Physical Anesthesia Plan  ASA: II  Anesthesia Plan: General   Post-op Pain Management:    Induction: Intravenous  PONV Risk Score and Plan:   Airway Management Planned: LMA  Additional Equipment:   Intra-op Plan:   Post-operative Plan:   Informed Consent: I have reviewed the patients History and Physical, chart, labs and discussed the procedure including the risks, benefits and alternatives for the proposed anesthesia with the patient or authorized representative who has indicated his/her understanding and acceptance.     Plan Discussed with: CRNA  Anesthesia Plan Comments:         Anesthesia Quick Evaluation

## 2018-04-14 NOTE — Interval H&P Note (Signed)
History and Physical Interval Note:  04/14/2018 11:43 AM  [name]  has presented today for surgery, with the diagnosis of torn medial meniscus right knee  The various methods of treatment have been discussed with the patient and family. After consideration of risks, benefits and other options for treatment, the patient has consented to  Procedure(s) with comments: KNEE ARTHROSCOPY WITH MEDIAL MENISECTOMY (Right) - right on June 20th at 12:45 as a surgical intervention .  The patient's history has been reviewed, patient examined, no change in status, stable for surgery.  I have reviewed the patient's chart and labs.  Questions were answered to the patient's satisfaction.     Arther Abbott

## 2018-04-14 NOTE — Transfer of Care (Signed)
Immediate Anesthesia Transfer of Care Note  Patient: [name]  Procedure(s) Performed: KNEE ARTHROSCOPY WITH MEDIAL MENISECTOMY (Right Knee)  Patient Location: PACU  Anesthesia Type:General  Level of Consciousness: awake and patient cooperative  Airway & Oxygen Therapy: Patient Spontanous Breathing  Post-op Assessment: stable  Post vital signs: Reviewed and stable  Last Vitals:  Vitals Value Taken Time  BP 133/82 04/14/2018  1:03 PM  Temp    Pulse 86 04/14/2018  1:04 PM  Resp 11 04/14/2018  1:04 PM  SpO2 96 % 04/14/2018  1:04 PM  Vitals shown include unvalidated device data.  Last Pain:  Vitals:   04/14/18 1138  TempSrc: Oral  PainSc: 4       Patients Stated Pain Goal: 7 (69/79/48 0165)  Complications: No apparent anesthesia complications

## 2018-04-14 NOTE — Anesthesia Postprocedure Evaluation (Signed)
Anesthesia Post Note  Patient: [name]  Procedure(s) Performed: KNEE ARTHROSCOPY WITH MEDIAL MENISECTOMY (Right Knee)  Patient location during evaluation: PACU Anesthesia Type: General Level of consciousness: awake and alert and patient cooperative Pain management: satisfactory to patient Vital Signs Assessment: post-procedure vital signs reviewed and stable Respiratory status: spontaneous breathing Cardiovascular status: stable Postop Assessment: no apparent nausea or vomiting Anesthetic complications: no     Last Vitals:  Vitals:   04/14/18 1315 04/14/18 1330  BP: 117/77 126/72  Pulse: 85 74  Resp: (!) 8 (!) 9  Temp:    SpO2: 96% 98%    Last Pain:  Vitals:   04/14/18 1330  TempSrc:   PainSc: 2                  Keirah Konitzer

## 2018-04-14 NOTE — Anesthesia Procedure Notes (Signed)
Procedure Name: LMA Insertion Date/Time: 04/14/2018 12:13 PM Performed by: Vista Deck, CRNA Pre-anesthesia Checklist: Patient identified, Patient being monitored, Emergency Drugs available, Timeout performed and Suction available Patient Re-evaluated:Patient Re-evaluated prior to induction Oxygen Delivery Method: Circle System Utilized Preoxygenation: Pre-oxygenation with 100% oxygen Induction Type: IV induction Ventilation: Mask ventilation without difficulty LMA: LMA inserted LMA Size: 5.0 Number of attempts: 1 Placement Confirmation: positive ETCO2 and breath sounds checked- equal and bilateral Tube secured with: Tape Dental Injury: Teeth and Oropharynx as per pre-operative assessment

## 2018-04-14 NOTE — Discharge Instructions (Signed)
Surgical findings  The medial meniscus was torn he had mild arthritis on the medial or inside part of your knee we removed a 16% of the medial meniscus  The lateral side of the knee was normal  The kneecap area had arthritis mild Knee Arthroscopy, Care After Refer to this sheet in the next few weeks. These instructions provide you with information about caring for yourself after your procedure. Your health care provider may also give you more specific instructions. Your treatment has been planned according to current medical practices, but problems sometimes occur. Call your health care provider if you have any problems or questions after your procedure. What can I expect after the procedure? After the procedure, it is common to have:  Soreness.  Pain.  Follow these instructions at home: Bathing  Do not take baths, swim, or use a hot tub until your health care provider approves. Incision care  There are many different ways to close and cover an incision, including stitches, skin glue, and adhesive strips. Follow your health care providers instructions about: ? Incision care. ? Bandage (dressing) changes and removal. ? Incision closure removal.  Check your incision area every day for signs of infection. Watch for: ? Redness, swelling, or pain. ? Fluid, blood, or pus. Activity  Avoid strenuous activities for as long as directed by your health care provider.  Return to your normal activities as directed by your health care provider. Ask your health care provider what activities are safe for you.  Perform range-of-motion exercises only as directed by your health care provider.  Do not lift anything that is heavier than 10 lb (4.5 kg).  Do not drive or operate heavy machinery while taking pain medicine.  If you were given crutches, use them as directed by your health care provider. Managing pain, stiffness, and swelling  If directed, apply ice to the injured area: ? Put ice  in a plastic bag. ? Place a towel between your skin and the bag. ? Leave the ice on for 20 minutes, 2-3 times per day.  Raise the injured area above the level of your heart while you are sitting or lying down as directed by your health care provider. General instructions  Keep all follow-up visits as directed by your health care provider. This is important.  Take medicines only as directed by your health care provider.  Do not use any tobacco products, including cigarettes, chewing tobacco, or electronic cigarettes. If you need help quitting, ask your health care provider.  If you were given compression stockings, wear them as directed by your health care provider. These stockings help prevent blood clots and reduce swelling in your legs. Contact a health care provider if:  You have severe pain with any movement of your knee.  You notice a bad smell coming from the incision or dressing.  You have redness, swelling, or pain at the site of your incision.  You have fluid, blood, or pus coming from your incision. Get help right away if:  You develop a rash.  You have a fever.  You have difficulty breathing or have shortness of breath.  You develop pain in your calves or in the back of your knee.  You develop chest pain.  You develop numbness or tingling in your leg or foot. This information is not intended to replace advice given to you by your health care provider. Make sure you discuss any questions you have with your health care provider. Document Released: 05/01/2005 Document Revised:  03/13/2016 Document Reviewed: 10/08/2014 Elsevier Interactive Patient Education  [employer].

## 2018-04-14 NOTE — Op Note (Signed)
04/14/2018  1:04 PM  PATIENT:  [name]  67 y.o. male  PRE-OPERATIVE DIAGNOSIS:  torn medial meniscus right knee  POST-OPERATIVE DIAGNOSIS:  torn medial meniscus right knee  PROCEDURE:  Procedure(s) with comments: KNEE ARTHROSCOPY WITH MEDIAL MENISECTOMY (Right) - right on June 20th at 12:45  Operative findings torn medial meniscus posterior horn half of the posterior horn was resected (16% of the meniscus) Grade I chondromalacia medial femoral condyle Normal ACL PCL Normal lateral compartment including meniscus Grade I chondromalacia trochlea and patella  Knee arthroscopy dictation  The patient was identified in the preoperative holding area using 2 approved identification mechanisms. The chart was reviewed and updated. The surgical site was confirmed as right knee and marked with an indelible marker.  The patient was taken to the operating room for anesthesia. After successful general anesthesia, Ancef 2 g was used as IV antibiotics.  The patient was placed in the supine position with the (right) the operative extremity in an arthroscopic leg holder and the opposite extremity in a padded leg holder.  The timeout was executed.  A lateral portal was established with an 11 blade and the scope was introduced into the joint. A diagnostic arthroscopy was performed in circumferential manner examining the entire knee joint. A medial portal was established and the diagnostic arthroscopy was repeated using a probe to palpate intra-articular structures as they were encountered.     The medial meniscus was resected using a duckbill forceps. The meniscal fragments were removed with a motorized shaver. The meniscus was balanced with a combination of a motorized shaver and a 50 ConMed wand until a stable rim was obtained.  The arthroscopic pump was placed on the wash mode and any excess debris was removed from the joint using suction.  60 cc of Marcaine with epinephrine was injected  through the arthroscope.  The portals were closed with 3-0 nylon suture.  A sterile bandage, Ace wrap and Cryo/Cuff was placed and the Cryo/Cuff was activated. The patient was taken to the recovery room in stable condition.  SURGEON:  Surgeon(s) and Role:    * Carole Civil, MD - Primary  PHYSICIAN ASSISTANT:   ASSISTANTS: none   ANESTHESIA:   general  EBL:  0 mL   BLOOD ADMINISTERED:none  DRAINS: none   LOCAL MEDICATIONS USED:  MARCAINE     SPECIMEN:  No Specimen  DISPOSITION OF SPECIMEN:  N/A  COUNTS:  YES  TOURNIQUET:  * Missing tourniquet times found for documented tourniquets in log: [id number] *  DICTATION: .Dragon Dictation  PLAN OF CARE: Discharge to home after PACU  PATIENT DISPOSITION:  PACU - hemodynamically stable.   Delay start of Pharmacological VTE agent (>24hrs) due to surgical blood loss or risk of bleeding: not applicable

## 2018-04-15 ENCOUNTER — Encounter (HOSPITAL_COMMUNITY): Payer: Self-pay | Admitting: Orthopedic Surgery

## 2018-04-22 ENCOUNTER — Encounter: Payer: Self-pay | Admitting: Orthopedic Surgery

## 2018-04-22 ENCOUNTER — Ambulatory Visit (INDEPENDENT_AMBULATORY_CARE_PROVIDER_SITE_OTHER): Payer: Self-pay | Admitting: Orthopedic Surgery

## 2018-04-22 VITALS — BP 119/86 | HR 88 | Ht 68.0 in | Wt 225.0 lb

## 2018-04-22 DIAGNOSIS — Z9889 Other specified postprocedural states: Secondary | ICD-10-CM

## 2018-04-22 DIAGNOSIS — M23321 Other meniscus derangements, posterior horn of medial meniscus, right knee: Secondary | ICD-10-CM

## 2018-04-22 MED ORDER — HYDROCODONE-ACETAMINOPHEN 5-325 MG PO TABS: 1.0000 | ORAL_TABLET | Freq: Four times a day (QID) | ORAL | 0 refills | Status: DC | PRN

## 2018-04-22 NOTE — Progress Notes (Signed)
Postop visit #1 status post arthroscopy of the knee  Chief Complaint  Patient presents with  . Follow-up    Recheck on right knee, DOS 04-14-18.    He had a medial meniscectomy has arthritis behind his kneecap  He is doing well with flexion he has a 10 degree loss of extension he is amatory without assistive device has a slight limp his portals look clean there are no signs of infection is a mild effusion  Recommend home exercises follow-up in 3 weeks  Hydrocodone for 1 additional week

## 2018-05-06 ENCOUNTER — Telehealth: Payer: Self-pay | Admitting: Orthopedic Surgery

## 2018-05-06 ENCOUNTER — Other Ambulatory Visit: Payer: Self-pay | Admitting: Orthopedic Surgery

## 2018-05-06 DIAGNOSIS — M48062 Spinal stenosis, lumbar region with neurogenic claudication: Secondary | ICD-10-CM

## 2018-05-06 NOTE — Telephone Encounter (Signed)
Mr. Kincheloe would like to have an order sent to [facility] for an injection in his low back.  He said he has left knee pain and that this would help with that.  He does have a follow up appointment here next week.  Thanks

## 2018-05-06 NOTE — Telephone Encounter (Signed)
Put in order and left message for patient to advise. He can call to schedule

## 2018-05-06 NOTE — Telephone Encounter (Signed)
yes

## 2018-05-06 NOTE — Telephone Encounter (Signed)
He had some lumbar ESI's last year, wants to repeat, since back is flaring after his knee surgery, ok to send referral ?

## 2018-05-13 ENCOUNTER — Ambulatory Visit (INDEPENDENT_AMBULATORY_CARE_PROVIDER_SITE_OTHER): Payer: Medicare Other | Admitting: Orthopedic Surgery

## 2018-05-13 VITALS — BP 145/92 | HR 79 | Ht 68.0 in | Wt 225.0 lb

## 2018-05-13 DIAGNOSIS — Z9889 Other specified postprocedural states: Secondary | ICD-10-CM | POA: Diagnosis not present

## 2018-05-13 NOTE — Progress Notes (Signed)
Chief Complaint  Patient presents with  . Follow-up    Recheck on right knee, DOS 04-14-18.    Encounter Diagnosis  Name Primary?  . S/P right knee arthroscopy 04/14/18 Yes    67 year old male had a knee scope 3 days ago doing well has a little bit of itching his gait but he is not concerned about neither my.  He regained full range of motion no swelling  He does have some left lower back pain and is scheduled for epidurals next week  Follow-up with [place] as needed continue with home exercises progressed to stationary bike and treadmill as tolerated

## 2018-05-13 NOTE — Patient Instructions (Signed)
Home exercises  Tread mill Stationary bike

## 2018-05-16 ENCOUNTER — Ambulatory Visit
Admission: RE | Admit: 2018-05-16 | Discharge: 2018-05-16 | Disposition: A | Discharge status: Home / Self Care | Payer: Medicare Other | Source: Ambulatory Visit | Attending: Orthopedic Surgery | Admitting: Orthopedic Surgery

## 2018-05-16 DIAGNOSIS — M47817 Spondylosis without myelopathy or radiculopathy, lumbosacral region: Secondary | ICD-10-CM | POA: Diagnosis not present

## 2018-05-16 DIAGNOSIS — M48062 Spinal stenosis, lumbar region with neurogenic claudication: Secondary | ICD-10-CM

## 2018-05-16 MED ORDER — IOPAMIDOL (ISOVUE-M 200) INJECTION 41%
1.0000 mL | Freq: Once | INTRAMUSCULAR | Status: AC
  Administered 2018-05-16: 1 mL via EPIDURAL

## 2018-05-16 MED ORDER — METHYLPREDNISOLONE ACETATE 40 MG/ML INJ SUSP (RADIOLOG
120.0000 mg | Freq: Once | INTRAMUSCULAR | Status: AC
  Administered 2018-05-16: 120 mg via EPIDURAL

## 2018-05-27 ENCOUNTER — Other Ambulatory Visit: Payer: Self-pay

## 2018-06-14 DIAGNOSIS — E559 Vitamin D deficiency, unspecified: Secondary | ICD-10-CM | POA: Diagnosis not present

## 2018-06-14 DIAGNOSIS — I10 Essential (primary) hypertension: Secondary | ICD-10-CM | POA: Diagnosis not present

## 2018-06-14 DIAGNOSIS — N183 Chronic kidney disease, stage 3 (moderate): Secondary | ICD-10-CM | POA: Diagnosis not present

## 2018-06-14 DIAGNOSIS — D509 Iron deficiency anemia, unspecified: Secondary | ICD-10-CM | POA: Diagnosis not present

## 2018-06-14 DIAGNOSIS — Z79899 Other long term (current) drug therapy: Secondary | ICD-10-CM | POA: Diagnosis not present

## 2018-06-14 DIAGNOSIS — R809 Proteinuria, unspecified: Secondary | ICD-10-CM | POA: Diagnosis not present

## 2018-06-20 DIAGNOSIS — R159 Full incontinence of feces: Secondary | ICD-10-CM | POA: Diagnosis not present

## 2018-06-20 DIAGNOSIS — R150 Incomplete defecation: Secondary | ICD-10-CM | POA: Diagnosis not present

## 2018-06-24 DIAGNOSIS — J200 Acute bronchitis due to Mycoplasma pneumoniae: Secondary | ICD-10-CM | POA: Diagnosis not present

## 2018-06-24 DIAGNOSIS — G4733 Obstructive sleep apnea (adult) (pediatric): Secondary | ICD-10-CM | POA: Diagnosis not present

## 2018-08-24 ENCOUNTER — Other Ambulatory Visit: Payer: Self-pay | Admitting: Neurological Surgery

## 2018-08-24 DIAGNOSIS — M47812 Spondylosis without myelopathy or radiculopathy, cervical region: Secondary | ICD-10-CM

## 2018-09-05 ENCOUNTER — Other Ambulatory Visit: Payer: Self-pay | Admitting: Orthopedic Surgery

## 2018-09-05 DIAGNOSIS — M48062 Spinal stenosis, lumbar region with neurogenic claudication: Secondary | ICD-10-CM

## 2018-09-09 ENCOUNTER — Ambulatory Visit
Admission: RE | Admit: 2018-09-09 | Discharge: 2018-09-09 | Disposition: A | Discharge status: Home / Self Care | Payer: Medicare Other | Source: Ambulatory Visit | Attending: Orthopedic Surgery | Admitting: Orthopedic Surgery

## 2018-09-09 ENCOUNTER — Other Ambulatory Visit: Payer: No Typology Code available for payment source

## 2018-09-09 DIAGNOSIS — M48062 Spinal stenosis, lumbar region with neurogenic claudication: Secondary | ICD-10-CM

## 2018-09-09 DIAGNOSIS — M48061 Spinal stenosis, lumbar region without neurogenic claudication: Secondary | ICD-10-CM | POA: Diagnosis not present

## 2018-09-09 MED ORDER — IOPAMIDOL (ISOVUE-M 200) INJECTION 41%
1.0000 mL | Freq: Once | INTRAMUSCULAR | Status: AC
  Administered 2018-09-09: 1 mL via EPIDURAL

## 2018-09-09 MED ORDER — METHYLPREDNISOLONE ACETATE 40 MG/ML INJ SUSP (RADIOLOG
120.0000 mg | Freq: Once | INTRAMUSCULAR | Status: AC
  Administered 2018-09-09: 120 mg via EPIDURAL

## 2018-09-12 DIAGNOSIS — R159 Full incontinence of feces: Secondary | ICD-10-CM | POA: Diagnosis not present

## 2018-09-12 DIAGNOSIS — R150 Incomplete defecation: Secondary | ICD-10-CM | POA: Diagnosis not present

## 2018-09-14 ENCOUNTER — Ambulatory Visit (INDEPENDENT_AMBULATORY_CARE_PROVIDER_SITE_OTHER): Payer: Medicare Other | Admitting: Neurology

## 2018-09-14 ENCOUNTER — Encounter: Payer: Self-pay | Admitting: Neurology

## 2018-09-14 VITALS — BP 138/82 | HR 79 | Ht 68.0 in | Wt 226.0 lb

## 2018-09-14 DIAGNOSIS — G2581 Restless legs syndrome: Secondary | ICD-10-CM | POA: Diagnosis not present

## 2018-09-14 DIAGNOSIS — M48062 Spinal stenosis, lumbar region with neurogenic claudication: Secondary | ICD-10-CM | POA: Diagnosis not present

## 2018-09-14 DIAGNOSIS — G4733 Obstructive sleep apnea (adult) (pediatric): Secondary | ICD-10-CM

## 2018-09-14 DIAGNOSIS — Z9989 Dependence on other enabling machines and devices: Secondary | ICD-10-CM | POA: Diagnosis not present

## 2018-09-14 DIAGNOSIS — G4700 Insomnia, unspecified: Secondary | ICD-10-CM

## 2018-09-14 MED ORDER — GABAPENTIN 600 MG PO TABS
ORAL_TABLET | ORAL | 3 refills | Status: DC
Start: 2018-09-14 — End: 2021-05-01

## 2018-09-14 MED ORDER — PRAMIPEXOLE DIHYDROCHLORIDE 0.5 MG PO TABS: ORAL_TABLET | ORAL | 3 refills | Status: DC

## 2018-09-14 NOTE — Progress Notes (Signed)
[facility]  PATIENT: [name] DOB: 1951-04-21  REFERRING DOCTOR OR PCP:  Sinda Du SOURCE: patient, notes from Dr. Luan Pulling, imaging reports, MRI images on PACS  _________________________________   HISTORICAL  CHIEF COMPLAINT:  Chief Complaint  Patient presents with  . Follow-up    RM 12, alone. Last seen 09/14/17.   Marland Kitchen RLS    Taking mirapex. Tolerating well, no SE.   Marland Kitchen CPAP    Managed by PCP. Doing well, uses daily. DME: [facility].     HISTORY OF PRESENT ILLNESS:  [name] is a 67 y.o. man restless leg syndrome.      Update 09/14/2018: He feels he is doing well.    He notes mild restless leg syndrome but it is not causing him to lose sleep as much better.    He is on gabapentin 300 mg, Mirapex 0.5, amitriptyline 25 mg and Zolpidem 10 mg nightly.     He once tried stopping amitriptyline and felt worse so he went back on.      His OSA is well controlled on CPAP and he wears it 100% of the nights and also for naps.       He has lumbar spinal stenosis and gets ESI's in [place] a couple times a year with benefit x many months.     He has seen Dr, Ellene Route [facility] and may need surgery in the future.     Update 09/14/2017:     He feels his RLS is much better and he only gets mild symptoms at times, not bad enough to make him shake his legs or get out of bed.     He is sleeping well, getting 7 hours of sleep.  He takes Mirapex 0.5 and gabapentin 300 (1/2 of a 600 mg tablet)  nightly.   He also takes amitriptyline 25 mg and felt worse when he tried to discontinue it.   He also takes Ambien at night.    He has OSA and uses it nightly.   He also uses it for naps if in bed.      He gets some LBP and leg pain.   He gets ESI's about once a month with beenfit.   [facility] Dr[facility] Ellene [facility].  From 03/08/2017: He has RLS that can be severe at times and is sometimes in his arms and it occurs for a week or so every month.    He was unable to stop amitriptyline  as his sleep maintenance worsened tremendously (has been on x 20 years).    When present, he feels an itching sensation and crawly sensation on his skin that improves with walking or moving around.     Requip was poorly tolerated (bad dreams and drowsiness during the day).  Mirapex 0.25 mg is helping some.  Gabapentin 300 mg may be helping slightly.   He also has had iron infusions and that may be helping.  He also has OSA.   He is on CPAP and is very compliant.     He has sleep onset and sleep maintenance insomnia.   Ambien greatly helped his sleep onset insomnia and amitriptyline hlps the sleep maintenance.  Now, once asleep he sleeps well.       MRI of the lumbar spine 09/16/2016 shows severe spinal stenosis at L4-L5 at that level. There is facet and ligamentum flavum hypertrophy and congenitally short pedicles. There is also a left lateral disc bulge. At L5-S1 there is facet hypertrophy and  a right lateral disc bulge that does not compress any nerve roots.    MRI of the brain 11/10/2016 was normal.   I also reviewed an MRI of the cervical spine from 2012. He has borderline to mild spinal stenosis at a couple levels in the mid cervical spine and multilevel narrowing though there is no definite nerve root compression.  The worse foraminal narrowing is to the left at C7-T1 that could affect the C8 nerve root.  REVIEW OF SYSTEMS: Constitutional: No fevers, chills, sweats, or change in appetite Eyes: No visual changes, double vision, eye pain Ear, nose and throat: No hearing loss, ear pain, nasal congestion, sore throat Cardiovascular: No chest pain, palpitations Respiratory: No shortness of breath at rest or with exertion.   No wheezes.   He has OSA on CPAP GastrointestinaI: No nausea, vomiting, diarrhea, abdominal pain, fecal incontinence Genitourinary: No dysuria, urinary retention or frequency.  No nocturia. Musculoskeletal: Reports mild neck pain, back pain Integumentary: No rash, pruritus,  skin lesions Neurological: as above Psychiatric: No depression at this time.  No anxiety Endocrine: No palpitations, diaphoresis, change in appetite, change in weigh or increased thirst Hematologic/Lymphatic: No anemia, purpura, petechiae. Allergic/Immunologic: No itchy/runny eyes, nasal congestion, recent allergic reactions, rashes  ALLERGIES: Allergies  Allergen Reactions  . Nsaids     "Cannot take because of my kidney disease."    HOME MEDICATIONS:  Current Outpatient Medications:  .  amitriptyline (ELAVIL) 25 MG tablet, Take 25 mg by mouth at bedtime., Disp: , Rfl:  .  aspirin EC 81 MG tablet, Take 81 mg by mouth every morning., Disp: , Rfl:  .  fluticasone (FLONASE) 50 MCG/ACT nasal spray, Place 1 spray into both nostrils daily., Disp: , Rfl:  .  gabapentin (NEURONTIN) 600 MG tablet, Take 1/2 to 1 po qHS, Disp: 90 tablet, Rfl: 3 .  hydrochlorothiazide (MICROZIDE) 12.5 MG capsule, Take 12.5 mg by mouth every morning. , Disp: , Rfl:  .  pramipexole (MIRAPEX) 0.5 MG tablet, Take one or two at bedtime, Disp: 180 tablet, Rfl: 3 .  zolpidem (AMBIEN) 10 MG tablet, Take 10 mg by mouth at bedtime., Disp: , Rfl:   PAST MEDICAL HISTORY: Past Medical History:  Diagnosis Date  . Anemia   . Arthritis    spinal stenosis  . History of kidney stones   . Hyperlipidemia   . Hypertension   . Kidney function abnormal   . 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 Sleep apnea    uses Cpap    PAST SURGICAL HISTORY: Past Surgical History:  Procedure Laterality Date  . CATARACT EXTRACTION     10-15 years ago  . COLONOSCOPY  05/25/2012   Procedure: COLONOSCOPY;  Surgeon: Rogene Houston, MD;  [facility];  Service: Endoscopy;  Laterality: N/A;  1030  . COLONOSCOPY N/A 05/14/2017   Procedure: COLONOSCOPY;  Surgeon: Rogene Houston, MD;  [facility];  Service: Endoscopy;  Laterality: N/A;  10:10  . CYSTOSCOPY/RETROGRADE/URETEROSCOPY Right 06/07/2014    Procedure: CYSTOSCOPY, RIGHT RETROGRADE, RIGHT URETEROSCOPY, RIGHT URETERAL BALLOON DILATATION, BIOPSY, RIGHT URETERAL STENT;  Surgeon: Jorja Loa, MD;  Location: [facility];  Service: Urology;  Laterality: Right;  . kidney stone  1983  . KNEE ARTHROSCOPY WITH MEDIAL MENISECTOMY Right 04/14/2018   Procedure: KNEE ARTHROSCOPY WITH MEDIAL MENISECTOMY;  Surgeon: Carole Civil, MD;  Location: [facility];  Service: Orthopedics;  Laterality: Right;  . left foot surgery  2010   repair of joint    FAMILY HISTORY: Family History  Problem Relation Age of Onset  . Cancer Father   . Cancer Brother     SOCIAL HISTORY:  Social History   Socioeconomic History  . Marital status: Married    Spouse name: Not on file  . Number of children: Not on file  . Years of education: Not on file  . Highest education level: Not on file  Occupational History  . Not on file  Social Needs  . Financial resource strain: Not on file  . Food insecurity:    Worry: Not on file    Inability: Not on file  . Transportation needs:    Medical: Not on file    Non-medical: Not on file  Tobacco Use  . Smoking status: Never Smoker  . Smokeless tobacco: Never Used  Substance and Sexual Activity  . Alcohol use: No    Comment: rare  . Drug use: No  . Sexual activity: Yes    Birth control/protection: None  Lifestyle  . Physical activity:    Days per week: Not on file    Minutes per session: Not on file  . Stress: Not on file  Relationships  . Social connections:    Talks on phone: Not on file    Gets together: Not on file    Attends religious service: Not on file    Active member of club or organization: Not on file    Attends meetings of clubs or organizations: Not on file    Relationship status: Not on file  . Intimate partner violence:    Fear of current or ex partner: Not on file    Emotionally abused: Not on file    Physically abused: Not on file    Forced sexual activity: Not  on file  Other Topics Concern  . Not on file  Social History Narrative  . Not on file     PHYSICAL EXAM  Vitals:   09/14/18 1119  BP: 138/82  Pulse: 79  Weight: 226 lb (102.5 kg)  Height: 5\' 8"  (1.727 m)    Body mass index is 34.36 kg/m.   General: The patient is well-developed and well-nourished and in no acute distress   Skin: Limbs are without rash or edema.   Neurologic Exam  Mental status: The patient is alert and oriented x 3 at the time of the examination. The patient has apparent normal recent and remote memory, with an apparently normal attention span and concentration ability.   Speech is normal.  Cranial nerves: Extraocular movements are full.  Facial strength is normal.. Trapezius strength is normal.  No obvious hearing deficits are noted.  Motor:  Muscle bulk is normal.   Tone is normal. Strength is  5 / 5 in all 4 extremities.   Sensory: He has intact sensation to touch and vibration in the arms. In the legs he has intact touch but there is mild reduced vibration in the toes but normal sensation at the ankles  Coordination: Cerebellar testing reveals good finger-nose-finger and heel-to-shin bilaterally.  Gait and station: Station is normal.   The gait and tandem gait are normal.  Romberg is negative.  Reflexes: Deep tendon reflexes are symmetric and normal bilaterally.       DIAGNOSTIC DATA (LABS, IMAGING, TESTING) - I reviewed patient records, labs, notes, testing and imaging myself where available.  Lab Results  Component Value Date   WBC 6.3 04/13/2018   HGB  13.3 04/13/2018   HCT 40.8 04/13/2018   MCV 90.9 04/13/2018   PLT 207 04/13/2018      Component Value Date/Time   NA 141 04/13/2018 0822   K 3.8 04/13/2018 0822   CL 106 04/13/2018 0822   CO2 28 04/13/2018 0822   GLUCOSE 118 (H) 04/13/2018 0822   BUN 17 04/13/2018 0822   CREATININE 1.54 (H) 04/13/2018 0822   CALCIUM 9.0 04/13/2018 0822   PROT 6.1 (L) 05/14/2017 0940   PROT 6.8  12/07/2016 1026   ALBUMIN 3.7 05/14/2017 0940   AST 23 05/14/2017 0940   ALT 21 05/14/2017 0940   ALKPHOS 53 05/14/2017 0940   BILITOT 0.8 05/14/2017 0940   GFRNONAA 45 (L) 04/13/2018 0822   GFRAA 52 (L) 04/13/2018 7846       ASSESSMENT AND PLAN   Restless leg syndrome  Spinal stenosis of lumbar region with neurogenic claudication  OSA on CPAP  Insomnia, unspecified type   1.   Continue Mirapex and gabapentin for restless leg syndrome.   2.   CPAP for OSA. 3.    If his primary care provider is willing to write for his restless legs medications, we can make the follow-up as needed.  Otherwise, he will return to see me in 12 months.   He should call sooner if he has any new or worsening neurologic symptoms.    Brooklynne Pereida A. Felecia Shelling, MD, PhD 96/29/5284, 1:32 PM Certified in Neurology, Clinical Neurophysiology, Sleep Medicine, Pain Medicine and Neuroimaging  [facility] [address], [place], [place] [address] [phone]

## 2018-09-19 ENCOUNTER — Ambulatory Visit (INDEPENDENT_AMBULATORY_CARE_PROVIDER_SITE_OTHER): Payer: Medicare Other | Admitting: Otolaryngology

## 2018-09-20 ENCOUNTER — Ambulatory Visit (INDEPENDENT_AMBULATORY_CARE_PROVIDER_SITE_OTHER): Payer: No Typology Code available for payment source | Admitting: Internal Medicine

## 2018-10-04 ENCOUNTER — Other Ambulatory Visit: Payer: Self-pay | Admitting: Orthopedic Surgery

## 2018-10-04 DIAGNOSIS — M48061 Spinal stenosis, lumbar region without neurogenic claudication: Secondary | ICD-10-CM

## 2018-10-06 DIAGNOSIS — Z79899 Other long term (current) drug therapy: Secondary | ICD-10-CM | POA: Diagnosis not present

## 2018-10-06 DIAGNOSIS — N183 Chronic kidney disease, stage 3 (moderate): Secondary | ICD-10-CM | POA: Diagnosis not present

## 2018-10-06 DIAGNOSIS — D509 Iron deficiency anemia, unspecified: Secondary | ICD-10-CM | POA: Diagnosis not present

## 2018-10-06 DIAGNOSIS — E559 Vitamin D deficiency, unspecified: Secondary | ICD-10-CM | POA: Diagnosis not present

## 2018-10-06 DIAGNOSIS — I10 Essential (primary) hypertension: Secondary | ICD-10-CM | POA: Diagnosis not present

## 2018-10-06 DIAGNOSIS — R809 Proteinuria, unspecified: Secondary | ICD-10-CM | POA: Diagnosis not present

## 2018-10-13 ENCOUNTER — Ambulatory Visit
Admission: RE | Admit: 2018-10-13 | Discharge: 2018-10-13 | Disposition: A | Discharge status: Home / Self Care | Payer: Medicare Other | Source: Ambulatory Visit | Attending: Orthopedic Surgery | Admitting: Orthopedic Surgery

## 2018-10-13 DIAGNOSIS — M48061 Spinal stenosis, lumbar region without neurogenic claudication: Secondary | ICD-10-CM

## 2018-10-13 MED ORDER — METHYLPREDNISOLONE ACETATE 40 MG/ML INJ SUSP (RADIOLOG
120.0000 mg | Freq: Once | INTRAMUSCULAR | Status: AC
  Administered 2018-10-13: 120 mg via EPIDURAL

## 2018-10-13 MED ORDER — IOPAMIDOL (ISOVUE-M 200) INJECTION 41%
1.0000 mL | Freq: Once | INTRAMUSCULAR | Status: AC
  Administered 2018-10-13: 1 mL via EPIDURAL

## 2018-12-07 ENCOUNTER — Other Ambulatory Visit (HOSPITAL_COMMUNITY): Payer: Self-pay | Admitting: Neurological Surgery

## 2018-12-07 DIAGNOSIS — M5416 Radiculopathy, lumbar region: Secondary | ICD-10-CM

## 2018-12-07 DIAGNOSIS — I10 Essential (primary) hypertension: Secondary | ICD-10-CM | POA: Diagnosis not present

## 2018-12-07 DIAGNOSIS — Z6834 Body mass index (BMI) 34.0-34.9, adult: Secondary | ICD-10-CM | POA: Diagnosis not present

## 2018-12-14 ENCOUNTER — Ambulatory Visit (HOSPITAL_COMMUNITY): Payer: Medicare Other

## 2018-12-20 ENCOUNTER — Ambulatory Visit (HOSPITAL_COMMUNITY)
Admission: RE | Admit: 2018-12-20 | Discharge: 2018-12-20 | Disposition: A | Discharge status: Home / Self Care | Payer: Medicare Other | Source: Ambulatory Visit | Attending: Neurological Surgery | Admitting: Neurological Surgery

## 2018-12-20 DIAGNOSIS — M5416 Radiculopathy, lumbar region: Secondary | ICD-10-CM | POA: Diagnosis not present

## 2018-12-20 DIAGNOSIS — M545 Low back pain: Secondary | ICD-10-CM | POA: Diagnosis not present

## 2018-12-30 DIAGNOSIS — M48062 Spinal stenosis, lumbar region with neurogenic claudication: Secondary | ICD-10-CM | POA: Diagnosis not present

## 2018-12-30 DIAGNOSIS — I10 Essential (primary) hypertension: Secondary | ICD-10-CM | POA: Diagnosis not present

## 2018-12-30 DIAGNOSIS — Z6834 Body mass index (BMI) 34.0-34.9, adult: Secondary | ICD-10-CM | POA: Diagnosis not present

## 2019-01-24 DIAGNOSIS — Z0000 Encounter for general adult medical examination without abnormal findings: Secondary | ICD-10-CM | POA: Diagnosis not present

## 2019-02-02 DIAGNOSIS — M48062 Spinal stenosis, lumbar region with neurogenic claudication: Secondary | ICD-10-CM | POA: Diagnosis not present

## 2019-02-02 DIAGNOSIS — M5416 Radiculopathy, lumbar region: Secondary | ICD-10-CM | POA: Diagnosis not present

## 2019-02-02 DIAGNOSIS — M5126 Other intervertebral disc displacement, lumbar region: Secondary | ICD-10-CM | POA: Diagnosis not present

## 2019-03-23 DIAGNOSIS — M79642 Pain in left hand: Secondary | ICD-10-CM | POA: Diagnosis not present

## 2019-03-23 DIAGNOSIS — M1812 Unilateral primary osteoarthritis of first carpometacarpal joint, left hand: Secondary | ICD-10-CM | POA: Diagnosis not present

## 2019-03-29 DIAGNOSIS — E559 Vitamin D deficiency, unspecified: Secondary | ICD-10-CM | POA: Diagnosis not present

## 2019-03-29 DIAGNOSIS — D649 Anemia, unspecified: Secondary | ICD-10-CM | POA: Diagnosis not present

## 2019-03-29 DIAGNOSIS — Z79899 Other long term (current) drug therapy: Secondary | ICD-10-CM | POA: Diagnosis not present

## 2019-03-29 DIAGNOSIS — N183 Chronic kidney disease, stage 3 (moderate): Secondary | ICD-10-CM | POA: Diagnosis not present

## 2019-03-29 DIAGNOSIS — I10 Essential (primary) hypertension: Secondary | ICD-10-CM | POA: Diagnosis not present

## 2019-03-29 DIAGNOSIS — R809 Proteinuria, unspecified: Secondary | ICD-10-CM | POA: Diagnosis not present

## 2019-04-11 DIAGNOSIS — Z96652 Presence of left artificial knee joint: Secondary | ICD-10-CM | POA: Insufficient documentation

## 2019-04-11 DIAGNOSIS — M13132 Monoarthritis, not elsewhere classified, left wrist: Secondary | ICD-10-CM | POA: Diagnosis not present

## 2019-04-11 DIAGNOSIS — M1812 Unilateral primary osteoarthritis of first carpometacarpal joint, left hand: Secondary | ICD-10-CM | POA: Diagnosis not present

## 2019-04-25 DIAGNOSIS — N200 Calculus of kidney: Secondary | ICD-10-CM | POA: Insufficient documentation

## 2019-04-25 DIAGNOSIS — R809 Proteinuria, unspecified: Secondary | ICD-10-CM | POA: Diagnosis not present

## 2019-04-25 DIAGNOSIS — E8889 Other specified metabolic disorders: Secondary | ICD-10-CM | POA: Diagnosis not present

## 2019-04-25 DIAGNOSIS — M898X9 Other specified disorders of bone, unspecified site: Secondary | ICD-10-CM | POA: Insufficient documentation

## 2019-04-25 DIAGNOSIS — E889 Metabolic disorder, unspecified: Secondary | ICD-10-CM | POA: Insufficient documentation

## 2019-04-25 DIAGNOSIS — N183 Chronic kidney disease, stage 3 (moderate): Secondary | ICD-10-CM | POA: Diagnosis not present

## 2019-04-25 DIAGNOSIS — I10 Essential (primary) hypertension: Secondary | ICD-10-CM | POA: Diagnosis not present

## 2019-04-26 DIAGNOSIS — Z4789 Encounter for other orthopedic aftercare: Secondary | ICD-10-CM | POA: Diagnosis not present

## 2019-04-26 DIAGNOSIS — M1812 Unilateral primary osteoarthritis of first carpometacarpal joint, left hand: Secondary | ICD-10-CM | POA: Diagnosis not present

## 2019-04-26 DIAGNOSIS — M79642 Pain in left hand: Secondary | ICD-10-CM | POA: Diagnosis not present

## 2019-05-08 ENCOUNTER — Ambulatory Visit (INDEPENDENT_AMBULATORY_CARE_PROVIDER_SITE_OTHER): Payer: Medicare Other

## 2019-05-08 ENCOUNTER — Ambulatory Visit (INDEPENDENT_AMBULATORY_CARE_PROVIDER_SITE_OTHER): Payer: Medicare Other | Admitting: Orthopedic Surgery

## 2019-05-08 ENCOUNTER — Encounter: Payer: Self-pay | Admitting: Orthopedic Surgery

## 2019-05-08 ENCOUNTER — Other Ambulatory Visit: Payer: Self-pay

## 2019-05-08 VITALS — BP 137/77 | HR 75 | Temp 97.5°F | Ht 68.0 in | Wt 235.0 lb

## 2019-05-08 DIAGNOSIS — G8929 Other chronic pain: Secondary | ICD-10-CM

## 2019-05-08 DIAGNOSIS — M25562 Pain in left knee: Secondary | ICD-10-CM

## 2019-05-08 NOTE — Progress Notes (Signed)
[name]  05/08/2019  HISTORY SECTION :  Chief Complaint  Patient presents with  . Knee Pain    left knee painful    The patient presents for evaluation of left knee twisted mediate pain medial joint line Location left knee Duration 3 weeks  Quality dull pain  Severity 4-6 Associated with no locking   The patient presents for evaluation of left knee twisted pain medial joint  Location left knee Duration 3 weeks  Quality dull pain  Severity 4-6 Associated with no locking   Review of Systems  Musculoskeletal: Positive for back pain and joint pain.  All other systems reviewed and are negative.    Past Medical History:  Diagnosis Date  . Anemia   . Arthritis    spinal stenosis  . History of kidney stones   . Hyperlipidemia   . Hypertension   . Kidney function abnormal   . 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 Sleep apnea    uses Cpap    Past Surgical History:  Procedure Laterality Date  . CATARACT EXTRACTION     10-15 years ago  . COLONOSCOPY  05/25/2012   Procedure: COLONOSCOPY;  Surgeon: Rogene Houston, MD;  [facility];  Service: Endoscopy;  Laterality: N/A;  1030  . COLONOSCOPY N/A 05/14/2017   Procedure: COLONOSCOPY;  Surgeon: Rogene Houston, MD;  [facility];  Service: Endoscopy;  Laterality: N/A;  10:10  . CYSTOSCOPY/RETROGRADE/URETEROSCOPY Right 06/07/2014   Procedure: CYSTOSCOPY, RIGHT RETROGRADE, RIGHT URETEROSCOPY, RIGHT URETERAL BALLOON DILATATION, BIOPSY, RIGHT URETERAL STENT;  Surgeon: Jorja Loa, MD;  Location: [facility];  Service: Urology;  Laterality: Right;  . kidney stone  1983  . KNEE ARTHROSCOPY WITH MEDIAL MENISECTOMY Right 04/14/2018   Procedure: KNEE ARTHROSCOPY WITH MEDIAL MENISECTOMY;  Surgeon: Carole Civil, MD;  Location: [facility];  Service: Orthopedics;  Laterality: Right;  . left foot surgery  2010   repair of joint     Allergies  Allergen Reactions  .  Nsaids Other (See Comments)    "Cannot take because of my kidney disease."     Current Outpatient Medications:  .  amitriptyline (ELAVIL) 25 MG tablet, Take 25 mg by mouth at bedtime., Disp: , Rfl:  .  aspirin EC 81 MG tablet, Take 81 mg by mouth every morning., Disp: , Rfl:  .  fluticasone (FLONASE) 50 MCG/ACT nasal spray, Place 1 spray into both nostrils daily., Disp: , Rfl:  .  gabapentin (NEURONTIN) 600 MG tablet, Take 1/2 to 1 po qHS, Disp: 90 tablet, Rfl: 3 .  hydrochlorothiazide (MICROZIDE) 12.5 MG capsule, Take 12.5 mg by mouth every morning. , Disp: , Rfl:  .  pramipexole (MIRAPEX) 0.5 MG tablet, Take one or two at bedtime, Disp: 180 tablet, Rfl: 3 .  zolpidem (AMBIEN) 10 MG tablet, Take 10 mg by mouth at bedtime., Disp: , Rfl:    PHYSICAL EXAM SECTION: 1) BP 137/77   Pulse 75   Temp (!) 97.5 F (36.4 C)   Ht 5\' 8"  (1.727 m)   Wt 235 lb (106.6 kg)   BMI 35.73 kg/m   Body mass index is 35.73 kg/m. General appearance: Well-developed well-nourished no gross deformities  2) Cardiovascular normal pulse and perfusion in all 4 extremities normal color without edema  3) Neurologically deep tendon reflexes are equal and normal, no sensation loss or deficits no pathologic reflexes  4) Psychological: Awake alert and oriented x3  mood and affect normal  5) Skin no lacerations or ulcerations no nodularity no palpable masses, no erythema or nodularity  6) Musculoskeletal:  Right knee looks good no swelling or tenderness full range of motion  Left knee medial joint line tenderness no effusion full range of motion meniscal signs are negative ligaments are stable   MEDICAL DECISION SECTION:  Encounter Diagnosis  Name Primary?  . Chronic pain of left knee Yes    Imaging See report but his x-ray looks good his alignment however was 0 degrees tibiofemoral alignment but without a lot of joint space narrowing medially no secondary bone changes except on the axial view of the  patella  Plan:  (Rx., Inj., surg., Frx, MRI/CT, XR:2) Procedure note left knee injection   verbal consent was obtained to inject left knee joint  Timeout was completed to confirm the site of injection  The medications used were 40 mg of Depo-Medrol and 1% lidocaine 3 cc  Anesthesia was provided by ethyl chloride and the skin was prepped with alcohol.  After cleaning the skin with alcohol a 20-gauge needle was used to inject the left knee joint. There were no complications. A sterile bandage was applied.   3:12 PM

## 2019-05-08 NOTE — Patient Instructions (Signed)

## 2019-05-10 DIAGNOSIS — Z4789 Encounter for other orthopedic aftercare: Secondary | ICD-10-CM | POA: Diagnosis not present

## 2019-05-10 DIAGNOSIS — M79642 Pain in left hand: Secondary | ICD-10-CM | POA: Diagnosis not present

## 2019-05-10 DIAGNOSIS — M1812 Unilateral primary osteoarthritis of first carpometacarpal joint, left hand: Secondary | ICD-10-CM | POA: Diagnosis not present

## 2019-05-24 DIAGNOSIS — Z4789 Encounter for other orthopedic aftercare: Secondary | ICD-10-CM | POA: Diagnosis not present

## 2019-05-24 DIAGNOSIS — M79645 Pain in left finger(s): Secondary | ICD-10-CM | POA: Diagnosis not present

## 2019-05-24 DIAGNOSIS — M1812 Unilateral primary osteoarthritis of first carpometacarpal joint, left hand: Secondary | ICD-10-CM | POA: Diagnosis not present

## 2019-05-24 DIAGNOSIS — M25542 Pain in joints of left hand: Secondary | ICD-10-CM | POA: Insufficient documentation

## 2019-05-29 DIAGNOSIS — M79645 Pain in left finger(s): Secondary | ICD-10-CM | POA: Diagnosis not present

## 2019-06-05 ENCOUNTER — Ambulatory Visit (INDEPENDENT_AMBULATORY_CARE_PROVIDER_SITE_OTHER): Payer: Medicare Other | Admitting: Orthopedic Surgery

## 2019-06-05 ENCOUNTER — Other Ambulatory Visit: Payer: Self-pay

## 2019-06-05 ENCOUNTER — Encounter: Payer: Self-pay | Admitting: Orthopedic Surgery

## 2019-06-05 VITALS — Temp 97.9°F | Resp 16 | Ht 68.0 in | Wt 235.0 lb

## 2019-06-05 DIAGNOSIS — M25562 Pain in left knee: Secondary | ICD-10-CM | POA: Diagnosis not present

## 2019-06-05 DIAGNOSIS — G8929 Other chronic pain: Secondary | ICD-10-CM | POA: Diagnosis not present

## 2019-06-05 NOTE — Progress Notes (Signed)
Chief Complaint  Patient presents with  . Knee Pain    left/ feels much better after injeciton     68 year old male was treated for the pain with injection he says is 100% better  Left knee full range of motion gait normal  Encounter Diagnosis  Name Primary?  . Chronic pain of left knee Yes    Return as needed activities as tolerated

## 2019-06-06 ENCOUNTER — Encounter: Payer: Self-pay | Admitting: Podiatry

## 2019-06-06 ENCOUNTER — Ambulatory Visit (INDEPENDENT_AMBULATORY_CARE_PROVIDER_SITE_OTHER): Payer: Medicare Other

## 2019-06-06 ENCOUNTER — Ambulatory Visit (INDEPENDENT_AMBULATORY_CARE_PROVIDER_SITE_OTHER): Payer: Medicare Other | Admitting: Podiatry

## 2019-06-06 VITALS — BP 129/69 | HR 68 | Temp 97.7°F | Resp 16

## 2019-06-06 DIAGNOSIS — M7752 Other enthesopathy of left foot: Secondary | ICD-10-CM | POA: Diagnosis not present

## 2019-06-06 DIAGNOSIS — M778 Other enthesopathies, not elsewhere classified: Secondary | ICD-10-CM

## 2019-06-06 DIAGNOSIS — M79645 Pain in left finger(s): Secondary | ICD-10-CM | POA: Diagnosis not present

## 2019-06-06 DIAGNOSIS — M779 Enthesopathy, unspecified: Secondary | ICD-10-CM

## 2019-06-07 ENCOUNTER — Encounter: Payer: Self-pay | Admitting: Podiatry

## 2019-06-07 NOTE — Progress Notes (Signed)
Subjective:  Patient ID: TYLIN FORCE, male    DOB: June 14, 1951,  MRN: [id number] HPI Chief Complaint  Patient presents with  . Foot Pain    1st MPJ left - sharp, sudden episodes of pain x couple months, previous joint replacement surgery by Dr. Milinda Pointer 10 years ago, no current treatment  . New Patient (Initial Visit)    68 y.o. male presents with the above complaint.   ROS: He denies fever chills nausea vomiting muscle aches pains calf pain back pain chest pain shortness of breath.  Past Medical History:  Diagnosis Date  . Anemia   . Arthritis    spinal stenosis  . History of kidney stones   . Hyperlipidemia   . Hypertension   . Kidney function abnormal   . 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 Sleep apnea    uses Cpap   Past Surgical History:  Procedure Laterality Date  . CATARACT EXTRACTION     10-15 years ago  . COLONOSCOPY  05/25/2012   Procedure: COLONOSCOPY;  Surgeon: Rogene Houston, MD;  [facility];  Service: Endoscopy;  Laterality: N/A;  1030  . COLONOSCOPY N/A 05/14/2017   Procedure: COLONOSCOPY;  Surgeon: Rogene Houston, MD;  [facility];  Service: Endoscopy;  Laterality: N/A;  10:10  . CYSTOSCOPY/RETROGRADE/URETEROSCOPY Right 06/07/2014   Procedure: CYSTOSCOPY, RIGHT RETROGRADE, RIGHT URETEROSCOPY, RIGHT URETERAL BALLOON DILATATION, BIOPSY, RIGHT URETERAL STENT;  Surgeon: Jorja Loa, MD;  Location: [facility];  Service: Urology;  Laterality: Right;  . kidney stone  1983  . KNEE ARTHROSCOPY WITH MEDIAL MENISECTOMY Right 04/14/2018   Procedure: KNEE ARTHROSCOPY WITH MEDIAL MENISECTOMY;  Surgeon: Carole Civil, MD;  Location: [facility];  Service: Orthopedics;  Laterality: Right;  . left foot surgery  2010   repair of joint    Current Outpatient Medications:  .  amitriptyline (ELAVIL) 25 MG tablet, Take 25 mg by mouth at bedtime., Disp: , Rfl:  .  aspirin EC 81 MG tablet, Take 81 mg by mouth every  morning., Disp: , Rfl:  .  fluticasone (FLONASE) 50 MCG/ACT nasal spray, Place 1 spray into both nostrils daily., Disp: , Rfl:  .  gabapentin (NEURONTIN) 600 MG tablet, Take 1/2 to 1 po qHS, Disp: 90 tablet, Rfl: 3 .  hydrochlorothiazide (MICROZIDE) 12.5 MG capsule, Take 12.5 mg by mouth every morning. , Disp: , Rfl:  .  pramipexole (MIRAPEX) 0.5 MG tablet, Take one or two at bedtime, Disp: 180 tablet, Rfl: 3 .  zolpidem (AMBIEN) 10 MG tablet, Take 10 mg by mouth at bedtime., Disp: , Rfl:   Allergies  Allergen Reactions  . Nsaids Other (See Comments)    "Cannot take because of my kidney disease."   Review of Systems Objective:   Vitals:   06/06/19 0906  BP: 129/69  Pulse: 68  Resp: 16  Temp: 97.7 F (36.5 C)    General: Well developed, nourished, in no acute distress, alert and oriented x3   Dermatological: Skin is warm, dry and supple bilateral. Nails x 10 are well maintained; remaining integument appears unremarkable at this time. There are no open sores, no preulcerative lesions, no rash or signs of infection present.  Vascular: Dorsalis Pedis artery and Posterior Tibial artery pedal pulses are 2/4 bilateral with immedate capillary fill time. Pedal hair growth present. No varicosities and no lower extremity edema present bilateral.   Neruologic: Grossly intact via light touch bilateral. Vibratory  intact via tuning fork bilateral. Protective threshold with Semmes Wienstein monofilament intact to all pedal sites bilateral. Patellar and Achilles deep tendon reflexes 2+ bilateral. No Babinski or clonus noted bilateral.   Musculoskeletal: No gross boney pedal deformities bilateral. No pain, crepitus, or limitation noted with foot and ankle range of motion bilateral. Muscular strength 5/5 in all groups tested bilateral.  No reproducible pain on palpation of the first metatarsophalangeal joint or hallux interphalangeal joint left.  Gait: Unassisted, Nonantalgic.    Radiographs:   Radiographs taken today demonstrate an implant we put in the first metatarsal phalangeal joint greater than 10 years ago it appears to be doing very well though it does appear that he may be developing some osteoarthritic changes to the hallux interphalangeal joint which may be resulting in some of his pain.  There does not appear to be any type of cortical interruption in the plantar area of the interphalangeal joint.  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  Plan: Patient simply wanted this information did not want any treatment we did discuss the possible use of a harder soled shoe to prevent excessive dorsiflexion of the hallux interphalangeal joint he understands that and is amenable to it.     Max T. [place], [place]

## 2019-06-16 DIAGNOSIS — M79645 Pain in left finger(s): Secondary | ICD-10-CM | POA: Diagnosis not present

## 2019-06-21 DIAGNOSIS — M1812 Unilateral primary osteoarthritis of first carpometacarpal joint, left hand: Secondary | ICD-10-CM | POA: Diagnosis not present

## 2019-06-21 DIAGNOSIS — Z4789 Encounter for other orthopedic aftercare: Secondary | ICD-10-CM | POA: Diagnosis not present

## 2019-06-21 DIAGNOSIS — M79645 Pain in left finger(s): Secondary | ICD-10-CM | POA: Diagnosis not present

## 2019-06-28 DIAGNOSIS — M79645 Pain in left finger(s): Secondary | ICD-10-CM | POA: Diagnosis not present

## 2019-07-05 DIAGNOSIS — M79645 Pain in left finger(s): Secondary | ICD-10-CM | POA: Diagnosis not present

## 2019-07-19 DIAGNOSIS — M1812 Unilateral primary osteoarthritis of first carpometacarpal joint, left hand: Secondary | ICD-10-CM | POA: Diagnosis not present

## 2019-07-19 DIAGNOSIS — M79645 Pain in left finger(s): Secondary | ICD-10-CM | POA: Diagnosis not present

## 2019-08-04 ENCOUNTER — Other Ambulatory Visit: Payer: Self-pay

## 2019-08-04 DIAGNOSIS — Z20822 Contact with and (suspected) exposure to covid-19: Secondary | ICD-10-CM

## 2019-08-06 LAB — NOVEL CORONAVIRUS, NAA: SARS-CoV-2, NAA: NOT DETECTED

## 2019-08-07 DIAGNOSIS — G4733 Obstructive sleep apnea (adult) (pediatric): Secondary | ICD-10-CM | POA: Diagnosis not present

## 2019-08-07 DIAGNOSIS — R5383 Other fatigue: Secondary | ICD-10-CM | POA: Diagnosis not present

## 2019-08-07 DIAGNOSIS — Z23 Encounter for immunization: Secondary | ICD-10-CM | POA: Diagnosis not present

## 2019-08-07 DIAGNOSIS — G2581 Restless legs syndrome: Secondary | ICD-10-CM | POA: Diagnosis not present

## 2019-08-07 DIAGNOSIS — E669 Obesity, unspecified: Secondary | ICD-10-CM | POA: Diagnosis not present

## 2019-08-09 DIAGNOSIS — Z125 Encounter for screening for malignant neoplasm of prostate: Secondary | ICD-10-CM | POA: Diagnosis not present

## 2019-08-09 DIAGNOSIS — R5383 Other fatigue: Secondary | ICD-10-CM | POA: Diagnosis not present

## 2019-08-09 DIAGNOSIS — G4733 Obstructive sleep apnea (adult) (pediatric): Secondary | ICD-10-CM | POA: Diagnosis not present

## 2019-08-09 DIAGNOSIS — E669 Obesity, unspecified: Secondary | ICD-10-CM | POA: Diagnosis not present

## 2019-08-09 DIAGNOSIS — G2581 Restless legs syndrome: Secondary | ICD-10-CM | POA: Diagnosis not present

## 2019-09-04 ENCOUNTER — Other Ambulatory Visit: Payer: Self-pay

## 2019-09-04 ENCOUNTER — Encounter: Payer: Self-pay | Admitting: Orthopedic Surgery

## 2019-09-04 ENCOUNTER — Ambulatory Visit (INDEPENDENT_AMBULATORY_CARE_PROVIDER_SITE_OTHER): Payer: Medicare Other | Admitting: Orthopedic Surgery

## 2019-09-04 VITALS — BP 134/63 | HR 80 | Ht 68.0 in | Wt 235.0 lb

## 2019-09-04 DIAGNOSIS — G8929 Other chronic pain: Secondary | ICD-10-CM

## 2019-09-04 DIAGNOSIS — M25562 Pain in left knee: Secondary | ICD-10-CM | POA: Diagnosis not present

## 2019-09-04 DIAGNOSIS — Z0000 Encounter for general adult medical examination without abnormal findings: Secondary | ICD-10-CM | POA: Diagnosis not present

## 2019-09-04 NOTE — Progress Notes (Signed)
Chief Complaint  Patient presents with  . Knee Pain    left     68 year old male with chronic left knee pain some meniscal pathology and osteoarthritis had increased pain last week.  He is having some increased lower back pain which he relates to having to limp around on his knee  He requests a knee injection (getting a new puppy)  Procedure note left knee injection   verbal consent was obtained to inject left knee joint  Timeout was completed to confirm the site of injection  The medications used were 40 mg of Depo-Medrol and 1% lidocaine 3 cc  Anesthesia was provided by ethyl chloride and the skin was prepped with alcohol.  After cleaning the skin with alcohol a 20-gauge needle was used to inject the left knee joint. There were no complications. A sterile bandage was applied.  Encounter Diagnosis  Name Primary?  . Chronic pain of left knee Yes

## 2019-09-04 NOTE — Patient Instructions (Signed)

## 2019-09-06 ENCOUNTER — Telehealth: Payer: Self-pay | Admitting: Orthopedic Surgery

## 2019-09-06 NOTE — Telephone Encounter (Signed)
Yes, make appointment, if he is not better.

## 2019-09-06 NOTE — Telephone Encounter (Signed)
Patient called to relay that the injection he was given on 09/04/19, 2 days ago, is not helping much. States he may need to come back in to discuss possible surgery. Please advise if a new appointment is needed or other advice. Cell# is [phone].

## 2019-09-07 DIAGNOSIS — D509 Iron deficiency anemia, unspecified: Secondary | ICD-10-CM | POA: Insufficient documentation

## 2019-09-08 NOTE — Telephone Encounter (Signed)
Called patient; scheduled appointment, although said it is getting a little better.

## 2019-09-11 DIAGNOSIS — N1831 Chronic kidney disease, stage 3a: Secondary | ICD-10-CM | POA: Diagnosis not present

## 2019-09-11 DIAGNOSIS — D649 Anemia, unspecified: Secondary | ICD-10-CM | POA: Diagnosis not present

## 2019-09-11 DIAGNOSIS — E559 Vitamin D deficiency, unspecified: Secondary | ICD-10-CM | POA: Diagnosis not present

## 2019-09-11 DIAGNOSIS — R809 Proteinuria, unspecified: Secondary | ICD-10-CM | POA: Diagnosis not present

## 2019-09-11 DIAGNOSIS — Z79899 Other long term (current) drug therapy: Secondary | ICD-10-CM | POA: Diagnosis not present

## 2019-09-11 DIAGNOSIS — I10 Essential (primary) hypertension: Secondary | ICD-10-CM | POA: Diagnosis not present

## 2019-09-12 DIAGNOSIS — D229 Melanocytic nevi, unspecified: Secondary | ICD-10-CM | POA: Diagnosis not present

## 2019-09-12 DIAGNOSIS — L821 Other seborrheic keratosis: Secondary | ICD-10-CM | POA: Diagnosis not present

## 2019-09-13 DIAGNOSIS — E6609 Other obesity due to excess calories: Secondary | ICD-10-CM | POA: Diagnosis not present

## 2019-09-13 DIAGNOSIS — N1832 Chronic kidney disease, stage 3b: Secondary | ICD-10-CM | POA: Diagnosis not present

## 2019-09-13 DIAGNOSIS — I129 Hypertensive chronic kidney disease with stage 1 through stage 4 chronic kidney disease, or unspecified chronic kidney disease: Secondary | ICD-10-CM | POA: Diagnosis not present

## 2019-09-13 DIAGNOSIS — N200 Calculus of kidney: Secondary | ICD-10-CM | POA: Diagnosis not present

## 2019-09-18 ENCOUNTER — Ambulatory Visit (INDEPENDENT_AMBULATORY_CARE_PROVIDER_SITE_OTHER): Payer: Medicare Other | Admitting: Neurology

## 2019-09-18 ENCOUNTER — Other Ambulatory Visit: Payer: Self-pay

## 2019-09-18 ENCOUNTER — Encounter: Payer: Self-pay | Admitting: Neurology

## 2019-09-18 ENCOUNTER — Telehealth: Payer: Self-pay | Admitting: Neurology

## 2019-09-18 DIAGNOSIS — Z9989 Dependence on other enabling machines and devices: Secondary | ICD-10-CM

## 2019-09-18 DIAGNOSIS — G4733 Obstructive sleep apnea (adult) (pediatric): Secondary | ICD-10-CM

## 2019-09-18 DIAGNOSIS — G2581 Restless legs syndrome: Secondary | ICD-10-CM | POA: Diagnosis not present

## 2019-09-18 DIAGNOSIS — R200 Anesthesia of skin: Secondary | ICD-10-CM

## 2019-09-18 DIAGNOSIS — G4700 Insomnia, unspecified: Secondary | ICD-10-CM | POA: Diagnosis not present

## 2019-09-18 MED ORDER — ZOLPIDEM TARTRATE 10 MG PO TABS: 10.0000 mg | ORAL_TABLET | Freq: Every day | ORAL | 1 refills | Status: DC

## 2019-09-18 MED ORDER — GABAPENTIN 300 MG PO CAPS: 300.0000 mg | ORAL_CAPSULE | Freq: Three times a day (TID) | ORAL | 3 refills | Status: DC

## 2019-09-18 MED ORDER — PRAMIPEXOLE DIHYDROCHLORIDE 0.5 MG PO TABS: ORAL_TABLET | ORAL | 3 refills | Status: DC

## 2019-09-18 NOTE — Progress Notes (Signed)
[facility]  PATIENT: [name] DOB: [birth year]  REFERRING DOCTOR OR PCP:  Sinda Du SOURCE: patient, notes from Dr. Luan Pulling, imaging reports, MRI images on PACS  _________________________________   HISTORICAL  CHIEF COMPLAINT:  Chief Complaint  Patient presents with   Follow-up    Rm 12 here for 1 year f/u on RLS   RLS    reports sx have worsened.- would like to discuss med managment, reports increased RLS, weakness and heavy feelings in bilateral legs     HISTORY OF PRESENT ILLNESS:  [name] is a 68 y.o. man restless leg syndrome.      Update 09/18/2019: He is noting more RLS symptoms.   He also feels weaker in his legs from the knees down.    Some days they feel very heavy.    He stopped Elavil and is still on gabapentin 300 mg and Mirapex 0.5 mg and zolpidem 10 mg nightly.  Dr. Luan Pulling is retiring and is switching to Dr. Nevada Crane.    RLS bothers him the most as soon as he lays down but symptoms start in the evening and occasionally early  He has OSA and uses CPAP every night and even for naps.   He has had insomnia his entire life.  Zolpidem has helped.    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   He had surgery 4/20 by Dr. Ellene Route and is better.    Update 09/14/2018: He feels he is doing well.    He notes mild restless leg syndrome but it is not causing him to lose sleep as much better.    He is on gabapentin 300 mg, Mirapex 0.5, amitriptyline 25 mg and Zolpidem 10 mg nightly.     He once tried stopping amitriptyline and felt worse so he went back on.      His OSA is well controlled on CPAP and he wears it 100% of the nights and also for naps.       He has lumbar spinal stenosis and gets ESI's in [place] a couple times a year with benefit x many months.     He has seen Dr, Ellene [facility] and may need surgery in the future.     Update 09/14/2017:     He feels his RLS  is much better and he only gets mild symptoms at times, not bad enough to make him shake his legs or get out of bed.     He is sleeping well, getting 7 hours of sleep.  He takes Mirapex 0.5 and gabapentin 300 (1/2 of a 600 mg tablet)  nightly.   He also takes amitriptyline 25 mg and felt worse when he tried to discontinue it.   He also takes Ambien at night.    He has OSA and uses it nightly.   He also uses it for naps if in bed.      He gets some LBP and leg pain.   He gets ESI's about once a month with beenfit.   [facility].  From 03/08/2017: He has RLS that can be severe at times and is sometimes in his arms and it occurs for a week or so every month.    He was unable to stop amitriptyline as his sleep maintenance worsened tremendously (has been on x 20 years).    When present, he feels an itching sensation and crawly sensation on his  skin that improves with walking or moving around.     Requip was poorly tolerated (bad dreams and drowsiness during the day).  Mirapex 0.25 mg is helping some.  Gabapentin 300 mg may be helping slightly.   He also has had iron infusions and that may be helping.  He also has OSA.   He is on CPAP and is very compliant.     He has sleep onset and sleep maintenance insomnia.   Ambien greatly helped his sleep onset insomnia and amitriptyline hlps the sleep maintenance.  Now, once asleep he sleeps well.       MRI of the lumbar spine 09/16/2016 shows severe spinal stenosis at L4-L5 at that level. There is facet and ligamentum flavum hypertrophy and congenitally short pedicles. There is also a left lateral disc bulge. At L5-S1 there is facet hypertrophy and a right lateral disc bulge that does not compress any nerve roots.    MRI of the brain 11/10/2016 was normal.   I also reviewed an MRI of the cervical spine from 2012. He has borderline to mild spinal stenosis at a couple levels in the mid cervical spine and multilevel narrowing though there is no  definite nerve root compression.  The worse foraminal narrowing is to the left at C7-T1 that could affect the C8 nerve root.  REVIEW OF SYSTEMS: Constitutional: No fevers, chills, sweats, or change in appetite Eyes: No visual changes, double vision, eye pain Ear, nose and throat: No hearing loss, ear pain, nasal congestion, sore throat Cardiovascular: No chest pain, palpitations Respiratory: No shortness of breath at rest or with exertion.   No wheezes.   He has OSA on CPAP GastrointestinaI: No nausea, vomiting, diarrhea, abdominal pain, fecal incontinence Genitourinary: No dysuria, urinary retention or frequency.  No nocturia. Musculoskeletal: Reports mild neck pain, back pain Integumentary: No rash, pruritus, skin lesions Neurological: as above Psychiatric: No depression at this time.  No anxiety Endocrine: No palpitations, diaphoresis, change in appetite, change in weigh or increased thirst Hematologic/Lymphatic: No anemia, purpura, petechiae. Allergic/Immunologic: No itchy/runny eyes, nasal congestion, recent allergic reactions, rashes  ALLERGIES: Allergies  Allergen Reactions   Nsaids Other (See Comments)    "Cannot take because of my kidney disease."    HOME MEDICATIONS:  Current Outpatient Medications:    aspirin EC 81 MG tablet, Take 81 mg by mouth every morning., Disp: , Rfl:    gabapentin (NEURONTIN) 600 MG tablet, Take 1/2 to 1 po qHS, Disp: 90 tablet, Rfl: 3   hydrochlorothiazide (MICROZIDE) 12.5 MG capsule, Take 12.5 mg by mouth every morning. 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   Sleep apnea    uses Cpap    PAST SURGICAL HISTORY: Past Surgical History:  Procedure Laterality Date   CATARACT EXTRACTION     10-15 years ago  COLONOSCOPY  05/25/2012   Procedure: COLONOSCOPY;  Surgeon: Rogene Houston, MD;  [facility];  Service: Endoscopy;  Laterality: N/A;  1030   COLONOSCOPY N/A 05/14/2017   Procedure: COLONOSCOPY;  Surgeon: Rogene Houston, MD;  [facility];  Service: Endoscopy;  Laterality: N/A;  10:10   CYSTOSCOPY/RETROGRADE/URETEROSCOPY Right 06/07/2014   Procedure: CYSTOSCOPY, RIGHT RETROGRADE, RIGHT URETEROSCOPY, RIGHT URETERAL BALLOON DILATATION, BIOPSY, RIGHT URETERAL STENT;  Surgeon: Jorja Loa, MD;  Location: [facility];  Service: Urology;  Laterality: Right;   kidney stone  1983   KNEE ARTHROSCOPY WITH MEDIAL MENISECTOMY Right 04/14/2018   Procedure: KNEE ARTHROSCOPY WITH MEDIAL MENISECTOMY;  Surgeon: Carole Civil, MD;  Location: [facility];  Service: Orthopedics;  Laterality: Right;   left foot surgery  2010   repair of joint    FAMILY HISTORY: Family History  Problem Relation Age of Onset   Cancer Father    Cancer Brother     SOCIAL HISTORY:  Social History   Socioeconomic History   Marital status: Married    Spouse name: Not on file   Number of children: Not on file   Years of education: Not on file   Highest education level: Not on file  Occupational History   Not on file  Social Needs   Financial resource strain: Not on file   Food insecurity    Worry: Not on file    Inability: Not on file   Transportation needs    Medical: Not on file    Non-medical: Not on file  Tobacco Use   Smoking status: Never Smoker   Smokeless tobacco: Never Used  Substance and Sexual Activity    Alcohol use: No    Comment: rare   Drug use: No   Sexual activity: Yes    Birth control/protection: None  Lifestyle   Physical activity    Days per week: Not on file    Minutes per session: Not on file   Stress: Not on file  Relationships   Social connections    Talks on phone: Not on file    Gets together: Not on file    Attends religious service: Not on file    Active member of club or organization: Not on file    Attends meetings of clubs or organizations: Not on file    Relationship status: Not on file   Intimate partner violence    Fear of current or ex partner: Not on file    Emotionally abused: Not on file    Physically abused: Not on file    Forced sexual activity: Not on file  Other Topics Concern   Not on file  Social History Narrative   Not on file     PHYSICAL EXAM  There were no vitals filed for this visit.  There is no height or weight on file to calculate BMI.   General: The patient is well-developed and well-nourished and in no acute distress   Skin: Limbs are without rash or edema.   Neurologic Exam  Mental status: The patient is alert and oriented x 3 at the time of the examination. The patient has apparent normal recent and remote memory, with an apparently normal attention span and concentration ability.   Speech is normal.  Cranial nerves: Extraocular movements are full.  Facial strength is normal.. Trapezius strength is normal.  No obvious hearing deficits are noted.  Motor:  Muscle bulk is normal.   Tone is  normal. Strength is  5 / 5 in all 4 extremities.   Sensory: He has intact sensation to touch and vibration in the arms.  He has 70% vibration sensation at the toes relative to the knees and 75% at the ankles relative to the knees  Coordination: Cerebellar testing reveals good finger-nose-finger and heel-to-shin bilaterally.  Gait and station: Station is normal.   The gait and tandem gait are normal.  Romberg is  negative.  Reflexes: Deep tendon reflexes are symmetric and normal bilaterally.       DIAGNOSTIC DATA (LABS, IMAGING, TESTING) - I reviewed patient records, labs, notes, testing and imaging myself where available.  Lab Results  Component Value Date   WBC 6.3 04/13/2018   HGB 13.3 04/13/2018   HCT 40.8 04/13/2018   MCV 90.9 04/13/2018   PLT 207 04/13/2018      Component Value Date/Time   NA 141 04/13/2018 0822   K 3.8 04/13/2018 0822   CL 106 04/13/2018 0822   CO2 28 04/13/2018 0822   GLUCOSE 118 (H) 04/13/2018 0822   BUN 17 04/13/2018 0822   CREATININE 1.54 (H) 04/13/2018 0822   CALCIUM 9.0 04/13/2018 0822   PROT 6.1 (L) 05/14/2017 0940   PROT 6.8 12/07/2016 1026   ALBUMIN 3.7 05/14/2017 0940   AST 23 05/14/2017 0940   ALT 21 05/14/2017 0940   ALKPHOS 53 05/14/2017 0940   BILITOT 0.8 05/14/2017 0940   GFRNONAA 45 (L) 04/13/2018 0822   GFRAA 52 (L) 04/13/2018 [id number]       ASSESSMENT AND PLAN   Restless leg syndrome  OSA on CPAP  Numbness  Insomnia, unspecified type   1.   Continue Mirapex (0.5 bid) and gabapentin (increase to 300 mg po tid) for restless leg syndrome and dysesthesias.   Ambien for insomnia.   If not better on higher dose of gabapentin, then check NCV/EMG  2.   CPAP for OSA. 3.    If his primary care provider is willing to write for his zolpidem and restless legs medications, we can make the follow-up as needed.  Otherwise, he will return to see [place] in 6 months.   He should call sooner if he has any new or worsening neurologic symptoms.    Jerimiah Wolman A. Felecia Shelling, MD, PhD [id number], [id number] PM Certified in Neurology, Clinical Neurophysiology, Sleep Medicine, Pain Medicine and Neuroimaging  [facility] [address], [place], [place] [address] [phone]

## 2019-09-18 NOTE — Telephone Encounter (Signed)
During checkout pt did not wish to schedule f/u FYI

## 2019-09-20 DIAGNOSIS — M25562 Pain in left knee: Secondary | ICD-10-CM | POA: Insufficient documentation

## 2019-09-25 ENCOUNTER — Ambulatory Visit: Payer: Medicare Other | Admitting: Orthopedic Surgery

## 2019-09-28 ENCOUNTER — Other Ambulatory Visit: Payer: Self-pay

## 2019-09-28 DIAGNOSIS — Z20822 Contact with and (suspected) exposure to covid-19: Secondary | ICD-10-CM

## 2019-09-28 DIAGNOSIS — Z20828 Contact with and (suspected) exposure to other viral communicable diseases: Secondary | ICD-10-CM | POA: Diagnosis not present

## 2019-09-29 ENCOUNTER — Other Ambulatory Visit (HOSPITAL_COMMUNITY): Payer: Self-pay | Admitting: Orthopedic Surgery

## 2019-09-29 ENCOUNTER — Other Ambulatory Visit: Payer: Self-pay | Admitting: Orthopedic Surgery

## 2019-09-29 DIAGNOSIS — M25562 Pain in left knee: Secondary | ICD-10-CM

## 2019-09-29 DIAGNOSIS — M2392 Unspecified internal derangement of left knee: Secondary | ICD-10-CM | POA: Diagnosis not present

## 2019-09-29 DIAGNOSIS — M238X2 Other internal derangements of left knee: Secondary | ICD-10-CM | POA: Diagnosis not present

## 2019-09-30 LAB — NOVEL CORONAVIRUS, NAA: SARS-CoV-2, NAA: NOT DETECTED

## 2019-10-03 DIAGNOSIS — G2581 Restless legs syndrome: Secondary | ICD-10-CM | POA: Diagnosis not present

## 2019-10-03 DIAGNOSIS — G4700 Insomnia, unspecified: Secondary | ICD-10-CM | POA: Diagnosis not present

## 2019-10-03 DIAGNOSIS — F321 Major depressive disorder, single episode, moderate: Secondary | ICD-10-CM | POA: Diagnosis not present

## 2019-10-03 DIAGNOSIS — M25562 Pain in left knee: Secondary | ICD-10-CM | POA: Diagnosis not present

## 2019-10-03 DIAGNOSIS — I129 Hypertensive chronic kidney disease with stage 1 through stage 4 chronic kidney disease, or unspecified chronic kidney disease: Secondary | ICD-10-CM | POA: Diagnosis not present

## 2019-10-03 DIAGNOSIS — E785 Hyperlipidemia, unspecified: Secondary | ICD-10-CM | POA: Diagnosis not present

## 2019-10-03 DIAGNOSIS — I10 Essential (primary) hypertension: Secondary | ICD-10-CM | POA: Diagnosis not present

## 2019-10-03 DIAGNOSIS — Z0189 Encounter for other specified special examinations: Secondary | ICD-10-CM | POA: Diagnosis not present

## 2019-10-04 ENCOUNTER — Telehealth: Payer: Self-pay | Admitting: Neurology

## 2019-10-04 DIAGNOSIS — M79604 Pain in right leg: Secondary | ICD-10-CM

## 2019-10-04 DIAGNOSIS — R200 Anesthesia of skin: Secondary | ICD-10-CM

## 2019-10-04 DIAGNOSIS — M79605 Pain in left leg: Secondary | ICD-10-CM

## 2019-10-04 NOTE — Telephone Encounter (Signed)
Called, LVM for pt letting him know we ordered EMG/NCS and to call our office back at [phone] to get scheduled. Advised he can schedule w/ phone staff when he calls back

## 2019-10-04 NOTE — Telephone Encounter (Signed)
Ok we can order EMG/NCV   "bilateral leg pain and dysesthesias"

## 2019-10-04 NOTE — Telephone Encounter (Signed)
Dr. Felecia Shelling- ok to proceed with ordering EMG/NCS?

## 2019-10-04 NOTE — Telephone Encounter (Signed)
Pt called stating that he is not feeling relief with the medication changes and is wanting to know if he can go ahead and get the NCV/EMG ordered for him. Please advise.

## 2019-10-09 ENCOUNTER — Other Ambulatory Visit: Payer: Self-pay

## 2019-10-09 ENCOUNTER — Ambulatory Visit (HOSPITAL_COMMUNITY)
Admission: RE | Admit: 2019-10-09 | Discharge: 2019-10-09 | Disposition: A | Discharge status: Home / Self Care | Payer: Medicare Other | Source: Ambulatory Visit | Attending: Orthopedic Surgery | Admitting: Orthopedic Surgery

## 2019-10-09 DIAGNOSIS — M25562 Pain in left knee: Secondary | ICD-10-CM | POA: Diagnosis not present

## 2019-10-10 ENCOUNTER — Encounter: Payer: Self-pay | Admitting: Neurology

## 2019-10-10 ENCOUNTER — Ambulatory Visit (INDEPENDENT_AMBULATORY_CARE_PROVIDER_SITE_OTHER): Payer: Medicare Other | Admitting: Neurology

## 2019-10-10 DIAGNOSIS — M48062 Spinal stenosis, lumbar region with neurogenic claudication: Secondary | ICD-10-CM

## 2019-10-10 DIAGNOSIS — E611 Iron deficiency: Secondary | ICD-10-CM

## 2019-10-10 DIAGNOSIS — G2581 Restless legs syndrome: Secondary | ICD-10-CM

## 2019-10-10 DIAGNOSIS — M79605 Pain in left leg: Secondary | ICD-10-CM | POA: Diagnosis not present

## 2019-10-10 DIAGNOSIS — R200 Anesthesia of skin: Secondary | ICD-10-CM | POA: Diagnosis not present

## 2019-10-10 DIAGNOSIS — M79604 Pain in right leg: Secondary | ICD-10-CM | POA: Diagnosis not present

## 2019-10-10 NOTE — Progress Notes (Signed)
[facility]  PATIENT: [name] DOB: 12-Mar-1951  REFERRING DOCTOR OR PCP:  Sinda Du SOURCE: patient, notes from Dr. Luan Pulling, imaging reports, MRI images on PACS  _________________________________   HISTORICAL  CHIEF COMPLAINT:  No chief complaint on file.   HISTORY OF PRESENT ILLNESS:  [name] is a 68 y.o. man restless leg syndrome.      Update 09/18/2019: He is noting more RLS symptoms.   He also feels weaker in his legs from the knees down.    Some days they feel very heavy.    He stopped Elavil and is still on gabapentin 300 mg and Mirapex 0.5 mg and zolpidem 10 mg nightly.  Dr. Luan Pulling is retiring and is switching to Dr. Nevada Crane.    RLS bothers him the most as soon as he lays down but symptoms start in the evening and occasionally early  He has OSA and uses CPAP every night and even for naps.   He has had insomnia his entire life.  Zolpidem has helped.    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   He had surgery 4/20 by Dr. Ellene Route and is better.    Update 09/14/2018: He feels he is doing well.    He notes mild restless leg syndrome but it is not causing him to lose sleep as much better.    He is on gabapentin 300 mg, Mirapex 0.5, amitriptyline 25 mg and Zolpidem 10 mg nightly.     He once tried stopping amitriptyline and felt worse so he went back on.      His OSA is well controlled on CPAP and he wears it 100% of the nights and also for naps.       He has lumbar spinal stenosis and gets ESI's in [place] a couple times a year with benefit x many months.     He has seen Dr, Ellene Route [facility] and may need surgery in the future.     Update 09/14/2017:     He feels his RLS is much better and he only gets mild symptoms at times, not bad enough to make him shake his legs or get out of bed.     He is sleeping well, getting 7 hours of sleep.  He takes Mirapex 0.5 and gabapentin 300  (1/2 of a 600 mg tablet)  nightly.   He also takes amitriptyline 25 mg and felt worse when he tried to discontinue it.   He also takes Ambien at night.    He has OSA and uses it nightly.   He also uses it for naps if in bed.      He gets some LBP and leg pain.   He gets ESI's about once a month with beenfit.   He [facility] Dr. Ellene [facility] at [facility].  From 03/08/2017: He has RLS that can be severe at times and is sometimes in his arms and it occurs for a week or so every month.    He was unable to stop amitriptyline as his sleep maintenance worsened tremendously (has been on x 20 years).    When present, he feels an itching sensation and crawly sensation on his skin that improves with walking or moving around.     Requip was poorly tolerated (bad dreams and drowsiness during the day).  Mirapex 0.25 mg is helping some.  Gabapentin 300 mg may be helping slightly.   He also has had  iron infusions and that may be helping.  He also has OSA.   He is on CPAP and is very compliant.     He has sleep onset and sleep maintenance insomnia.   Ambien greatly helped his sleep onset insomnia and amitriptyline hlps the sleep maintenance.  Now, once asleep he sleeps well.       MRI of the lumbar spine 09/16/2016 shows severe spinal stenosis at L4-L5 at that level. There is facet and ligamentum flavum hypertrophy and congenitally short pedicles. There is also a left lateral disc bulge. At L5-S1 there is facet hypertrophy and a right lateral disc bulge that does not compress any nerve roots.    MRI of the brain 11/10/2016 was normal.   I also reviewed an MRI of the cervical spine from 2012. He has borderline to mild spinal stenosis at a couple levels in the mid cervical spine and multilevel narrowing though there is no definite nerve root compression.  The worse foraminal narrowing is to the left at C7-T1 that could affect the C8 nerve root.  REVIEW OF SYSTEMS: Constitutional: No fevers, chills, sweats, or change in  appetite Eyes: No visual changes, double vision, eye pain Ear, nose and throat: No hearing loss, ear pain, nasal congestion, sore throat Cardiovascular: No chest pain, palpitations Respiratory: No shortness of breath at rest or with exertion.   No wheezes.   He has OSA on CPAP GastrointestinaI: No nausea, vomiting, diarrhea, abdominal pain, fecal incontinence Genitourinary: No dysuria, urinary retention or frequency.  No nocturia. Musculoskeletal: Reports mild neck pain, back pain Integumentary: No rash, pruritus, skin lesions Neurological: as above Psychiatric: No depression at this time.  No anxiety Endocrine: No palpitations, diaphoresis, change in appetite, change in weigh or increased thirst Hematologic/Lymphatic: No anemia, purpura, petechiae. Allergic/Immunologic: No itchy/runny eyes, nasal congestion, recent allergic reactions, rashes  ALLERGIES: Allergies  Allergen Reactions  . Nsaids Other (See Comments)    "Cannot take because of my kidney disease."    HOME MEDICATIONS:  Current Outpatient Medications:  .  aspirin EC 81 MG tablet, Take 81 mg by mouth every morning., Disp: , Rfl:  .  gabapentin (NEURONTIN) 300 MG capsule, Take 1 capsule (300 mg total) by mouth 3 (three) times daily., Disp: 270 capsule, Rfl: 3 .  gabapentin (NEURONTIN) 600 MG tablet, Take 1/2 to 1 po qHS, Disp: 90 tablet, Rfl: 3 .  hydrochlorothiazide (MICROZIDE) 12.5 MG capsule, Take 12.5 mg by mouth every morning. , Disp: , Rfl:  .  pramipexole (MIRAPEX) 0.5 MG tablet, Take at dinner and at bedtime, Disp: 180 tablet, Rfl: 3 .  zolpidem (AMBIEN) 10 MG tablet, Take 1 tablet (10 mg total) by mouth at bedtime., Disp: 90 tablet, Rfl: 1  PAST MEDICAL HISTORY: Past Medical History:  Diagnosis Date  . Anemia   . Arthritis    spinal stenosis  . History of kidney stones   . Hyperlipidemia   . Hypertension   . Kidney function abnormal   . 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 Sleep apnea     uses Cpap    PAST SURGICAL HISTORY: Past Surgical History:  Procedure Laterality Date  . CATARACT EXTRACTION     10-15 years ago  . COLONOSCOPY  05/25/2012   Procedure: COLONOSCOPY;  Surgeon: Rogene Houston, MD;  [facility];  Service: Endoscopy;  Laterality: N/A;  1030  . COLONOSCOPY N/A 05/14/2017   Procedure: COLONOSCOPY;  Surgeon: Rogene Houston, MD;  [facility];  Service: Endoscopy;  Laterality: N/A;  10:10  . CYSTOSCOPY/RETROGRADE/URETEROSCOPY Right 06/07/2014   Procedure: CYSTOSCOPY, RIGHT RETROGRADE, RIGHT URETEROSCOPY, RIGHT URETERAL BALLOON DILATATION, BIOPSY, RIGHT URETERAL STENT;  Surgeon: Jorja Loa, MD;  Location: [facility];  Service: Urology;  Laterality: Right;  . kidney stone  1983  . KNEE ARTHROSCOPY WITH MEDIAL MENISECTOMY Right 04/14/2018   Procedure: KNEE ARTHROSCOPY WITH MEDIAL MENISECTOMY;  Surgeon: Carole Civil, MD;  Location: [facility];  Service: Orthopedics;  Laterality: Right;  . left foot surgery  2010   repair of joint    FAMILY HISTORY: Family History  Problem Relation Age of Onset  . Cancer Father   . Cancer Brother     SOCIAL HISTORY:  Social History   Socioeconomic History  . Marital status: Married    Spouse name: Not on file  . Number of children: Not on file  . Years of education: Not on file  . Highest education level: Not on file  Occupational History  . Not on file  Tobacco Use  . Smoking status: Never Smoker  . Smokeless tobacco: Never Used  Substance and Sexual Activity  . Alcohol use: No    Comment: rare  . Drug use: No  . Sexual activity: Yes    Birth control/protection: None  Other Topics Concern  . Not on file  Social History Narrative  . Not on file   Social Determinants of Health   Financial Resource Strain:   . Difficulty of Paying Living Expenses: Not on file  Food Insecurity:   . Worried About Charity fundraiser in the Last Year: Not on file  . Ran Out  of Food in the Last Year: Not on file  Transportation Needs:   . Lack of Transportation (Medical): Not on file  . Lack of Transportation (Non-Medical): Not on file  Physical Activity:   . Days of Exercise per Week: Not on file  . Minutes of Exercise per Session: Not on file  Stress:   . Feeling of Stress : Not on file  Social Connections:   . Frequency of Communication with Friends and Family: Not on file  . Frequency of Social Gatherings with Friends and Family: Not on file  . Attends Religious Services: Not on file  . Active Member of Clubs or Organizations: Not on file  . Attends Archivist Meetings: Not on file  . Marital Status: Not on file  Intimate Partner Violence:   . Fear of Current or Ex-Partner: Not on file  . Emotionally Abused: Not on file  . Physically Abused: Not on file  . Sexually Abused: Not on file     PHYSICAL EXAM  There were no vitals filed for this visit.  There is no height or weight on file to calculate BMI.   General: The patient is well-developed and well-nourished and in no acute distress   Skin: There are no rashes.  No edema.  Neurologic Exam  Mental status: The patient is alert and oriented x 3 at the time of the examination. The patient has apparent normal recent and remote memory, with an apparently normal attention span and concentration ability.   Speech is normal.  Cranial nerves: Extraocular movements are full.  Facial strength is normal..   No obvious hearing deficits are noted.  Motor:  Muscle bulk is normal.   Tone is normal. Strength is  5 / 5 in all 4 extremities.   Sensory: He  has intact sensation to touch and vibration in the arms.  Vibration sensation is mildly reduced at the ankles and toes.    Coordination: Cerebellar testing reveals good finger-nose-finger and heel-to-shin bilaterally.  Gait and station: Station is normal.   The gait and tandem gait are normal.  Romberg is negative.  Reflexes: Deep tendon  reflexes are symmetric and normal bilaterally.       DIAGNOSTIC DATA (LABS, IMAGING, TESTING) - I reviewed patient records, labs, notes, testing and imaging myself where available.  Lab Results  Component Value Date   WBC 6.3 04/13/2018   HGB 13.3 04/13/2018   HCT 40.8 04/13/2018   MCV 90.9 04/13/2018   PLT 207 04/13/2018      Component Value Date/Time   NA 141 04/13/2018 0822   K 3.8 04/13/2018 0822   CL 106 04/13/2018 0822   CO2 28 04/13/2018 0822   GLUCOSE 118 (H) 04/13/2018 0822   BUN 17 04/13/2018 0822   CREATININE 1.54 (H) 04/13/2018 0822   CALCIUM 9.0 04/13/2018 0822   PROT 6.1 (L) 05/14/2017 0940   PROT 6.8 12/07/2016 1026   ALBUMIN 3.7 05/14/2017 0940   AST 23 05/14/2017 0940   ALT 21 05/14/2017 0940   ALKPHOS 53 05/14/2017 0940   BILITOT 0.8 05/14/2017 0940   GFRNONAA 45 (L) 04/13/2018 0822   GFRAA 52 (L) 04/13/2018 [id number]       ASSESSMENT AND PLAN   Restless leg syndrome  Spinal stenosis of lumbar region with neurogenic claudication  Numbness   1.   Change Mirapex to 0.5 mg at dinnertime and 0.5 mg at night.  If there is still lethargy in the morning he should just take 1 pill.  Continue gabapentin 300 mg p.o. 3 times daily.  Due to grade 3 CKD, we will increase this dose at this time.  2.   Ambien for insomnia.   If restless leg syndrome does not improve, consider changing Ambien to a benzodiazepine 3.   Check ferritin and iron.  He has been deficient in the past and that may be worsening his restless leg syndrome. 4.   Return in 6 months or sooner if there are new or worsening neurologic symptoms.    Shealyn Sean A. Felecia Shelling, MD, PhD [id number], [id number] PM Certified in Neurology, Clinical Neurophysiology, Sleep Medicine, Pain Medicine and Neuroimaging  [facility] [address] [place], [place] [address] [phone]

## 2019-10-10 NOTE — Progress Notes (Signed)
Full Name: [name] Gender: Male MRN #: [id number] Date of Birth: 1951-05-26    Visit Date: 10/10/2019 09:31 Age: 68 Years Examining Physician: Arlice Colt, MD  Referring Physician: Arlice Colt, MD    History: [name] is a 68 year old man with back pain, bilateral foot numbness and an unsteady gait.  He has restless leg syndrome at night.  Nerve conduction studies: Bilateral peroneal and tibial motor responses had normal distal latencies, amplitudes and conduction velocities.  Bilateral sural and superficial peroneal sensory responses had normal distal latencies and amplitudes.  Tibial F-wave latencies were normal though there was asymmetry, slower on the left.  Electromyography: Needle EMG of selected muscles of the right leg was performed.   Mild chronic denervation changes were seen in the right gluteus medius and peoneal longus muscles.   Other L5 or S1 innervated muscles had some polyphasic units though recruitment was normal.     IMPRESSION:  This NCV/EMG study shows 1.  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 2.   No superimposed polyneuropathy.   [name] A. Felecia Shelling, MD, PhD, FAAN Certified in Neurology, Clinical Neurophysiology, Sleep Medicine, Pain Medicine and Neuroimaging Director, [place] at [facility] [address], [place], [place] [address] [phone]        [facility]    Nerve / Sites Muscle Latency Ref. Amplitude Ref. Rel Amp Segments Distance Velocity Ref. Area    ms ms mV mV %  cm m/s m/s mVms  R Peroneal - EDB     Ankle EDB 4.2 ?6.5 10.1 ?2.0 100 Ankle - EDB 9   22.8     Fib head EDB 10.4  8.5  84.4 Fib head - Ankle 28 45 ?44 21.9     Pop fossa EDB 12.6  7.4  86.7 Pop fossa - Fib head 10 44 ?44 20.7         Pop fossa - Ankle      L Peroneal - EDB     Ankle EDB 4.3 ?6.5 9.1 ?2.0 100 Ankle - EDB 9   22.0     Fib head EDB 10.5  7.6  83.1  Fib head - Ankle 28 45 ?44 20.3     Pop fossa EDB 12.8  6.9  90.4 Pop fossa - Fib head 10 44 ?44 18.5         Pop fossa - Ankle      R Tibial - AH     Ankle AH 3.3 ?5.8 10.2 ?4.0 100 Ankle - AH 9   19.1     Pop fossa AH 12.3  6.8  66.3 Pop fossa - Ankle 37 41 ?41 15.4  L Tibial - AH     Ankle AH 4.0 ?5.8 5.8 ?4.0 100 Ankle - AH 9   11.5     Pop fossa AH 12.0  3.3  56.9 Pop fossa - Ankle 35 44 ?41 12.5             SNC    Nerve / Sites Rec. Site Peak Lat Ref.  Amp Ref. Segments Distance    ms ms V V  cm  R Sural - Ankle (Calf)     Calf Ankle 3.2 ?4.4 14 ?6 Calf - Ankle 14  L Sural - Ankle (Calf)     Calf Ankle 3.8 ?4.4 9 ?6 Calf - Ankle 14  R Superficial peroneal - Ankle  Lat leg Ankle 4.3 ?4.4 16 ?6 Lat leg - Ankle 14  L Superficial peroneal - Ankle     Lat leg Ankle 4.2 ?4.4 14 ?6 Lat leg - Ankle 14             F  Wave    Nerve F Lat Ref.   ms ms  R Tibial - AH 47.7 ?56.0  L Tibial - AH 55.9 ?56.0         EMG Summary Table    Spontaneous MUAP Recruitment  Muscle IA Fib PSW Fasc Other Amp Dur. Poly Pattern  R. Vastus medialis Normal None None None _______ Normal Normal Normal Normal  R. Tibialis anterior Normal None None None _______ Normal Normal 1+ Normal  R. Peroneus longus Normal None None None _______ Normal Normal 1+ Reduced  R. Gastrocnemius (Medial head) Normal None None None _______ Normal Normal 1+ Normal  R. Abductor hallucis Normal None None None _______ Normal Normal Normal Normal  R. Gluteus medius Normal None None None _______ Normal Normal 1+ Reduced  R. Iliopsoas Normal None None None _______ Normal Normal Normal Normal

## 2019-10-11 ENCOUNTER — Ambulatory Visit (INDEPENDENT_AMBULATORY_CARE_PROVIDER_SITE_OTHER): Payer: Medicare Other | Admitting: Cardiology

## 2019-10-11 ENCOUNTER — Ambulatory Visit: Payer: Medicare Other | Admitting: Cardiology

## 2019-10-11 ENCOUNTER — Telehealth: Payer: Self-pay | Admitting: *Deleted

## 2019-10-11 ENCOUNTER — Other Ambulatory Visit: Payer: Self-pay

## 2019-10-11 ENCOUNTER — Encounter: Payer: Self-pay | Admitting: Cardiology

## 2019-10-11 VITALS — BP 158/72 | HR 71 | Ht 68.0 in | Wt 231.0 lb

## 2019-10-11 DIAGNOSIS — R0602 Shortness of breath: Secondary | ICD-10-CM

## 2019-10-11 DIAGNOSIS — R012 Other cardiac sounds: Secondary | ICD-10-CM | POA: Diagnosis not present

## 2019-10-11 DIAGNOSIS — R0789 Other chest pain: Secondary | ICD-10-CM | POA: Insufficient documentation

## 2019-10-11 DIAGNOSIS — N1831 Chronic kidney disease, stage 3a: Secondary | ICD-10-CM

## 2019-10-11 DIAGNOSIS — G4733 Obstructive sleep apnea (adult) (pediatric): Secondary | ICD-10-CM | POA: Diagnosis not present

## 2019-10-11 DIAGNOSIS — R0600 Dyspnea, unspecified: Secondary | ICD-10-CM | POA: Diagnosis not present

## 2019-10-11 DIAGNOSIS — I209 Angina pectoris, unspecified: Secondary | ICD-10-CM | POA: Diagnosis not present

## 2019-10-11 DIAGNOSIS — I10 Essential (primary) hypertension: Secondary | ICD-10-CM | POA: Diagnosis not present

## 2019-10-11 DIAGNOSIS — Z9989 Dependence on other enabling machines and devices: Secondary | ICD-10-CM

## 2019-10-11 DIAGNOSIS — R0609 Other forms of dyspnea: Secondary | ICD-10-CM

## 2019-10-11 LAB — IRON AND TIBC
Iron Saturation: 18 % (ref 15–55)
Iron: 65 ug/dL (ref 38–169)
Total Iron Binding Capacity: 361 ug/dL (ref 250–450)
UIBC: 296 ug/dL (ref 111–343)

## 2019-10-11 LAB — FERRITIN: Ferritin: 91 ng/mL (ref 30–400)

## 2019-10-11 MED ORDER — METOPROLOL TARTRATE 50 MG PO TABS: 100.0000 mg | ORAL_TABLET | Freq: Once | ORAL | 0 refills | Status: DC

## 2019-10-11 MED ORDER — METOPROLOL TARTRATE 50 MG PO TABS: ORAL_TABLET | ORAL | 0 refills | Status: DC

## 2019-10-11 NOTE — Telephone Encounter (Signed)
-----   Message from Britt Bottom, MD sent at 10/11/2019  1:02 PM EST ----- Please let him know that the ferritin and the other iron labs were fine.

## 2019-10-11 NOTE — Telephone Encounter (Signed)
Called and spoke with pt about lab results per Dr. [name] note. Pt verbalized understanding.  

## 2019-10-11 NOTE — Progress Notes (Signed)
Primary Care Provider: Celene Squibb, MD Cardiologist: No primary care provider on file. Electrophysiologist:   Clinic Note: Chief Complaint  Patient presents with  . New Patient (Initial Visit)  . Shortness of Breath    With exertion, exercise intolerance  . Chest Pain    Described as a tightness with exertion    HPI:    [name] is a 68 y.o. male who is being seen today for the evaluation of chest tightness AND SHORTNESS OF BREATH, WEAKNESS at the request of Celene Squibb, MD.  [name] was referred by Dr. Nevada Crane after recent visit.  Recent Hospitalizations: None  Reviewed  CV studies:    The following studies were reviewed today: (if available, images/films reviewed: From Epic Chart or Care Everywhere) . None:   Interval History:   NUH MEEDS "[name] "is a very pleasant gentleman who is here today to discuss symptoms abdomen building up since the summertime.  Basically he says that for the last couple years up until this July, he and his wife would walk anywhere from 2 to 3 miles a day at a brisk clip.  He would only get short of breath if he really rushed up a hill or did significant activity, but now since July, he simply cannot keep up with his wife and has to stop to catch his breath.  If he keeps going he will noticed chest tightness that does not always come if he stops doing enough.    The symptoms of dyspnea and chest tightness resolved once he stops to catch his breath for a few minutes and then he is okay to go back on.  He is also a little bit limited because he tore his medial meniscus in the left knee so has not been able to walk as much because of that as well.  He says that this shortness of breath began before the knee pain.  He has classic OSA symptoms with an Epworth score of 20 complicated by restless leg syndrome.  He does use CPAP and is not able to lie down without the CPAP.  He has trivial edema but nothing that has bothered him.  He  says that he has been retired for the last ~10years and does hobby woodwork.  He has a wood shop that is in the bottom half of a split-level house.  He has to walk outside to get there.  He previously was able to walk up and down that little path without any difficulty and spend the whole day 10 hours in the shop working in building without difficulties.  Now he has to stop to catch his breath especially walking back up the path.  He has to usually catch his breath before he starts doing any work once he gets there on the way down.  He does not necessarily have chest tightness with this little bit of exertion however.  He says he has occasional flip-flops in there but no significant rapid irregular heartbeats or palpitations.  He says that the other time he notes his heart rate going fast is when he is exerting himself.  In addition to the shortness of breath with exertion, he also notes just general lack of get up and go as well.  He wants to do things, but just does not have energy to do so.   CV Review of Symptoms (Summary): positive for - chest pain, dyspnea on exertion and orthopnea negative for - edema, irregular heartbeat,  palpitations, paroxysmal nocturnal dyspnea, rapid heart rate, shortness of breath or Syncope/near syncope, TIA/amaurosis fugax, claudication  The patient does not have symptoms concerning for COVID-19 infection (fever, chills, cough, or new shortness of breath).  The patient is practicing social distancing. ++ Masking.  Very careful when he goes out for groceries/shopping.    REVIEWED OF SYSTEMS   A comprehensive ROS was performed. Review of Systems  Constitutional: Positive for malaise/fatigue (Notably reduced exercise tolerance). Negative for weight loss.  HENT: Negative for congestion and nosebleeds.   Respiratory: Positive for shortness of breath (Per HPI).   Cardiovascular: Positive for orthopnea (Cannot sleep without CPAP).  Gastrointestinal: Negative for blood in  stool, heartburn and melena.  Genitourinary: Negative for hematuria.  Musculoskeletal: Positive for joint pain (Has recent left knee meniscal tear.) and myalgias (Some cramps.). Negative for falls.  Neurological: Positive for tingling (Sciatic neuropathy with restless leg syndrome.). Negative for focal weakness.  Endo/Heme/Allergies: Negative for environmental allergies.  Psychiatric/Behavioral: The patient has insomnia (Trouble with restless leg syndrome.  He may have some sciatic neuropathy.).   All other systems reviewed and are negative.  I have reviewed and (if needed) personally updated the patient's problem list, medications, allergies, past medical and surgical history, social and family history.   PAST MEDICAL HISTORY   Past Medical History:  Diagnosis Date  . Anemia   . Arthritis    spinal stenosis  . History of kidney stones   . Hyperlipidemia   . Hypertension   . 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  . Sleep apnea    uses Cpap     PAST SURGICAL HISTORY   Past Surgical History:  Procedure Laterality Date  . CATARACT EXTRACTION     10-15 years ago  . COLONOSCOPY  05/25/2012   Procedure: COLONOSCOPY;  Surgeon: Rogene Houston, MD;  Location: [facility];  Service: Endoscopy;  Laterality: N/A;  1030  . COLONOSCOPY N/A 05/14/2017   Procedure: COLONOSCOPY;  Surgeon: Rogene Houston, MD;  Location: [facility];  Service: Endoscopy;  Laterality: N/A;  10:10  . CYSTOSCOPY/RETROGRADE/URETEROSCOPY Right 06/07/2014   Procedure: CYSTOSCOPY, RIGHT RETROGRADE, RIGHT URETEROSCOPY, RIGHT URETERAL BALLOON DILATATION, BIOPSY, RIGHT URETERAL STENT;  Surgeon: Jorja Loa, MD;  Location: [facility];  Service: Urology;  Laterality: Right;  . kidney stone  1983   Had partial right nephrectomy  . KNEE ARTHROSCOPY WITH MEDIAL MENISECTOMY Right 04/14/2018   Procedure: KNEE  ARTHROSCOPY WITH MEDIAL MENISECTOMY;  Surgeon: Carole Civil, MD;  Location: [facility];  Service: Orthopedics;  Laterality: Right;  . left foot surgery  2010   repair of joint     MEDICATIONS/ALLERGIES   Current Meds  Medication Sig  . aspirin EC 81 MG tablet Take 81 mg by mouth every morning.  . DULoxetine (CYMBALTA) 30 MG capsule Take 30 mg by mouth 2 (two) times daily.  Marland Kitchen gabapentin (NEURONTIN) 300 MG capsule Take 1 capsule (300 mg total) by mouth 3 (three) times daily.  Marland Kitchen gabapentin (NEURONTIN) 600 MG tablet Take 1/2 to 1 po qHS  . hydrochlorothiazide (MICROZIDE) 12.5 MG capsule Take 12.5 mg by mouth every morning.   . pramipexole (MIRAPEX) 0.5 MG tablet Take at dinner and at bedtime  . zolpidem (AMBIEN) 10 MG tablet Take 1 tablet (10 mg total) by mouth at bedtime.    Allergies  Allergen Reactions  . Nsaids Other (See Comments)    "  Cannot take because of my kidney disease."     SOCIAL HISTORY/FAMILY HISTORY   Social History   Tobacco Use  . Smoking status: Never Smoker  . Smokeless tobacco: Never Used  Substance Use Topics  . Alcohol use: No    Comment: rare  . Drug use: No   Social History   Social History Narrative   He is married.  Has no children.  Is retired Medical illustrator for [employer].  He has an occasional cigar but not anything routine.   December 2020: He said he used to walk about 2 to 3 miles a day with his wife, but now she keeps going and he has to stop because of dyspnea.    Family History family history includes Cancer in his father; Healthy in his brother, brother, and mother; Lung cancer in his brother.   OBJCTIVE -PE, EKG, labs   Wt Readings from Last 3 Encounters:  10/11/19 231 lb (104.8 kg)  09/04/19 235 lb (106.6 kg)  06/05/19 235 lb (106.6 kg)    Physical Exam: BP (!) 158/72   Pulse 71   Ht 5\' 8"  (1.727 m)   Wt 231 lb (104.8 kg)   BMI 35.12 kg/m  Physical Exam  Constitutional: He is oriented to person, place, and time. He  appears well-developed and well-nourished. No distress.  Well-groomed.  Moderately obese.  Healthy healthy-appearing.  HENT:  Head: Normocephalic and atraumatic.  Eyes: Pupils are equal, round, and reactive to light. Conjunctivae and EOM are normal. No scleral icterus.  Neck: No hepatojugular reflux and no JVD present. Carotid bruit is not present.  Cardiovascular: Normal rate, regular rhythm, S1 normal and S2 normal.  Occasional extrasystoles are present. Exam reveals distant heart sounds and decreased pulses (Mildly diminished pedal pulses, likely related to body habitus). Exam reveals no friction rub.  Cannot exclude murmur versus rub.  Sounds are distant.  Pulmonary/Chest: Effort normal. He has no wheezes. He has no rales. He exhibits no tenderness.  Distant breath sounds but no obvious rales or rhonchi.  Mild interstitial sounds.  Abdominal: Soft. Bowel sounds are normal. He exhibits no distension. There is no abdominal tenderness. There is no rebound.  Obese.  No HSM  Musculoskeletal:        General: Edema (Trivial) present. No deformity. Normal range of motion.     Cervical back: Normal range of motion and neck supple.  Neurological: He is alert and oriented to person, place, and time. No cranial nerve deficit.  Skin: Skin is warm and dry.  Psychiatric: He has a normal mood and affect. His behavior is normal. Judgment and thought content normal.  Vitals reviewed.   Adult ECG Report  Rate:71 ;  Rhythm: normal sinus rhythm and Normal axis, intervals and durations.;   Narrative Interpretation: Normal EKG  Recent Labs: I do not have recent labs, but he says his PCP checked in October.  Full lipid panel not available.  August 09, 2019: TC 205, TG 137, HDL 37.  CR 1.6.  A1c 5.5. Lab Results  Component Value Date   CHOL 183 05/14/2017   HDL 29 (L) 05/14/2017   LDLCALC 122 (H) 05/14/2017   TRIG 160 (H) 05/14/2017   CHOLHDL 6.3 05/14/2017   Lab Results  Component Value Date    CREATININE 1.54 (H) 04/13/2018   BUN 17 04/13/2018   NA 141 04/13/2018   K 3.8 04/13/2018   CL 106 04/13/2018   CO2 28 04/13/2018    ASSESSMENT/PLAN  Problem List Items Addressed This Visit    OSA on CPAP (Chronic)    His Epworth score is 20 without his CPAP.      Stage 3 chronic kidney disease (Chronic)    Mostly related to have a unilateral kidney but his creatinine is actually pretty stable on last check at 1.6.  I will ask him to hydrate with plenty of liquids on the night before morning of then after his CT scan.      Relevant Orders   EKG 12-Lead (Completed)   Basic metabolic panel   Essential (primary) hypertension (Chronic)    Blood pressure is high today.  He says usually at home it is not this high.  Will need to reassess on follow-up.  He is only on low-dose HCTZ.  Would probably avoid ACE inhibitor or ARB given his essentially one functional kidney. A lot of this will depend on what we see on his cardiac evaluation.      Relevant Medications   metoprolol tartrate (LOPRESSOR) 50 MG tablet   Angina pectoris, unspecified ([place]) - Primary    Chest pain/precordial pain concerning for angina pectoris.  This is with exertion and dyspnea and is relatively sudden onset.  Plan: Ischemic evaluation with coronary CT scan with possible CT FFR.  This in conjunction with echocardiogram gives excellent anatomic and physiologic data.  He would not be able walk on a treadmill because of left knee meniscal tear.  Obesity with probably increased likelihood of artifact on Myoview.      Relevant Medications   metoprolol tartrate (LOPRESSOR) 50 MG tablet   Other Relevant Orders   CT CORONARY MORPH W/CTA COR W/SCORE W/CA W/CM &/OR WO/CM   CT CORONARY FRACTIONAL FLOW RESERVE DATA PREP   CT CORONARY FRACTIONAL FLOW RESERVE FLUID ANALYSIS   ECHOCARDIOGRAM COMPLETE   DOE (dyspnea on exertion)    Notable decrease in exercise tolerance and exertion with exertional dyspnea.  This all  began sometime in the summertime suggestion that may potentially have some type of event that was not otherwise recognized.  I would like to evaluate his cardiac function with an echocardiogram.  Would also want to evaluate for ischemia.  With his body habitus I think a coronary CTA would probably be the best option.  Plan: Check 2D echo and coronary CTA --> Will need to prehydrate and premedicate for contrast hypersensitivity       Relevant Orders   EKG 12-Lead (Completed)   Basic metabolic panel   CT CORONARY MORPH W/CTA COR W/SCORE W/CA W/CM &/OR WO/CM   CT CORONARY FRACTIONAL FLOW RESERVE DATA PREP   CT CORONARY FRACTIONAL FLOW RESERVE FLUID ANALYSIS   ECHOCARDIOGRAM COMPLETE   Abnormal heart sounds    His heart sounds were distant and difficult to fully determine whether what I am hearing is a murmur or potentially soft gallop.  With his significant dyspnea, we will check 2D echocardiogram.      Relevant Orders   EKG 12-Lead (Completed)   CT CORONARY MORPH W/CTA COR W/SCORE W/CA W/CM &/OR WO/CM   CT CORONARY FRACTIONAL FLOW RESERVE DATA PREP   CT CORONARY FRACTIONAL FLOW RESERVE FLUID ANALYSIS    Other Visit Diagnoses    Shortness of breath       Relevant Orders   EKG 12-Lead (Completed)   CT CORONARY MORPH W/CTA COR W/SCORE W/CA W/CM &/OR WO/CM   CT CORONARY FRACTIONAL FLOW RESERVE DATA PREP   CT CORONARY FRACTIONAL FLOW RESERVE FLUID ANALYSIS   ECHOCARDIOGRAM  COMPLETE     Need to confirm if he does have a IVP contrast hypersensitivity.Marland Kitchen  COVID-19 Education: The signs and symptoms of COVID-19 were discussed with the patient and how to seek care for testing (follow up with PCP or arrange E-visit).   The importance of social distancing was discussed today.  I spent a total of 23 minutes with the patient and chart review. >  50% of the time was spent in direct patient consultation.  Additional time spent with chart review (studies, outside notes, etc): 14 Total Time:  37 min   Current medicines are reviewed at length with the patient today.  (+/- concerns) n/a   Patient Instructions / Medication Changes & Studies & Tests Ordered   Patient Instructions  Medication Instructions:  See instruction eet  *If you need a refill on your cardiac medications before your next appointment, please call your pharmacy*  Lab Work:  see instruction sheet  If you have labs (blood work) drawn today and your tests are completely normal, you will receive your results only by: Marland Kitchen MyChart Message (if you have MyChart) OR . A paper copy in the mail If you have any lab test that is abnormal or we need to change your treatment, we will call you to review the results.  Testing/Procedures:  will be schedule at [facility] has requested that you have cardiac CTA. Cardiac computed tomography (CT) is a painless test that uses an x-ray machine to take clear, detailed pictures of your heart. For further information please visit [facility]. Please follow instruction sheet as given.   [place]  Your physician has requested that you have an echocardiogram. Echocardiography is a painless test that uses sound waves to create images of your heart. It provides your doctor with information about the size and shape of your heart and how well your heart's chambers and valves are working. This procedure takes approximately one hour. There are no restrictions for this procedure.    Follow-Up: At [facility], you and your health needs are our priority.  As part of our continuing mission to provide you with exceptional heart care, we have created designated Provider Care Teams.  These Care Teams include your primary Cardiologist (physician) and Advanced Practice Providers (APPs -  Physician Assistants and Nurse Practitioners) who all work together to provide you with the care you need, when you need it.  Your next  appointment:   2 month(s)  The format for your next appointment:   In Person  Provider:   Glenetta Hew, MD  Other Instructions    Your cardiac CT will be scheduled at location:   [facility] [address] [place], [place] [address] [phone]    If scheduled at [facility], please arrive at the [facility] main entrance of [facility] 30-45 minutes prior to test start time. Proceed to the [facility] Radiology Department (first floor) to check-in and test prep.    Please follow these instructions carefully (unless otherwise directed):  PLEASE  HAVE LABS DONE ONE WEEK PRIOR TO TEST.  On the Night Before the Test: . Be sure to Drink plenty of water. . Do not consume any caffeinated/decaffeinated beverages or chocolate 12 hours prior to your test. . Do not take any antihistamines 12 hours prior to your test.   On the Day of the Test: . Drink plenty of water. Do not drink any water  within one hour of the test.  AT LEAST 2 LITERS . Do not eat any food 4 hours prior to the test. . You may take your regular medications prior to the test.  . Take metoprolol (Lopressor) 100 MG two hours prior to test. . HOLD Hydrochlorothiazide morning of the test. .   After the Test: . Drink plenty of water. AT LEAST 2 LITERS . After receiving IV contrast, you may experience a mild flushed feeling. This is normal. . On occasion, you may experience a mild rash up to 24 hours after the test. This is not dangerous. If this occurs, you can take Benadryl 25 mg and increase your fluid intake. . If you experience trouble breathing, this can be serious. If it is severe call 911 IMMEDIATELY. If it is mild, please call our office. .    Once we have confirmed authorization from your insurance company, we will call you to set up a date and time for your test.   For non-scheduling related questions, please contact the cardiac imaging nurse navigator should you have  any questions/concerns: Marchia Bond, RN Navigator Cardiac Imaging [facility] [phone] Office           Studies Ordered:   Orders Placed This Encounter  Procedures  . CT CORONARY MORPH W/CTA COR W/SCORE W/CA W/CM &/OR WO/CM  . CT CORONARY FRACTIONAL FLOW RESERVE DATA PREP  . CT CORONARY FRACTIONAL FLOW RESERVE FLUID ANALYSIS  . Basic metabolic panel  . EKG 12-Lead  . ECHOCARDIOGRAM COMPLETE     Glenetta Hew, M.D., M.S. Interventional Cardiologist   Pager # [phone] Phone # [phone] [address] [place], [place] [address]   Thank you for choosing Heartcare at [facility]!!

## 2019-10-11 NOTE — Patient Instructions (Addendum)
Medication Instructions:  See instruction eet  *If you need a refill on your cardiac medications before your next appointment, please call your pharmacy*  Lab Work:  see instruction sheet  If you have labs (blood work) drawn today and your tests are completely normal, you will receive your results only by: Marland Kitchen MyChart Message (if you have MyChart) OR . A paper copy in the mail If you have any lab test that is abnormal or we need to change your treatment, we will call you to review the results.  Testing/Procedures:  will be schedule at [place] has requested that you have cardiac CTA. Cardiac computed tomography (CT) is a painless test that uses an x-ray machine to take clear, detailed pictures of your heart. For further information please visit [employer]uge[employer]. Please follow instruction sheet as given.   [place]  Your physician has requested that you have an echocardiogram. Echocardiography is a painless test that uses sound waves to create images of your heart. It provides your doctor with information about the size and shape of your heart and how well your heart's chambers and valves are working. This procedure takes approximately one hour. There are no restrictions for this procedure.    Follow-Up: At [facility], you and your health needs are our priority.  As part of our continuing mission to provide you with exceptional heart care, we have created designated Provider Care Teams.  These Care Teams include your primary Cardiologist (physician) and Advanced Practice Providers (APPs -  Physician Assistants and Nurse Practitioners) who all work together to provide you with the care you need, when you need it.  Your next appointment:   2 month(s)  The format for your next appointment:   In Person  Provider:   Glenetta Hew, MD  Other Instructions    Your cardiac CT will be scheduled at location:   [facility] [address] [place], [place] [address] [phone]    If scheduled at [facility], please arrive at the [facility] main entrance of [facility] 30-45 minutes prior to test start time. Proceed to the [facility] Radiology Department (first floor) to check-in and test prep.    Please follow these instructions carefully (unless otherwise directed):  PLEASE  HAVE LABS DONE ONE WEEK PRIOR TO TEST.  On the Night Before the Test: . Be sure to Drink plenty of water. . Do not consume any caffeinated/decaffeinated beverages or chocolate 12 hours prior to your test. . Do not take any antihistamines 12 hours prior to your test.   On the Day of the Test: . Drink plenty of water. Do not drink any water within one hour of the test.  AT LEAST 2 LITERS . Do not eat any food 4 hours prior to the test. . You may take your regular medications prior to the test.  . Take metoprolol (Lopressor) 100 MG two hours prior to test. . HOLD Hydrochlorothiazide morning of the test. .   After the Test: . Drink plenty of water. AT LEAST 2 LITERS . After receiving IV contrast, you may experience a mild flushed feeling. This is normal. . On occasion, you may experience a mild rash up to 24 hours after the test. This is not dangerous. If this occurs, you can take Benadryl 25 mg and increase your fluid intake. . If you experience trouble breathing, this can be serious. If it is  severe call 911 IMMEDIATELY. If it is mild, please call our office. .    Once we have confirmed authorization from your insurance company, we will call you to set up a date and time for your test.   For non-scheduling related questions, please contact the cardiac imaging nurse navigator should you have any questions/concerns: Marchia Bond, RN Navigator Cardiac Imaging [facility] [phone] Office

## 2019-10-13 ENCOUNTER — Encounter: Payer: Self-pay | Admitting: Cardiology

## 2019-10-13 NOTE — Assessment & Plan Note (Signed)
His Epworth score is 20 without his CPAP.

## 2019-10-13 NOTE — Assessment & Plan Note (Signed)
His heart sounds were distant and difficult to fully determine whether what I am hearing is a murmur or potentially soft gallop.  With his significant dyspnea, we will check 2D echocardiogram.

## 2019-10-13 NOTE — Assessment & Plan Note (Signed)
Notable decrease in exercise tolerance and exertion with exertional dyspnea.  This all began sometime in the summertime suggestion that may potentially have some type of event that was not otherwise recognized.  I would like to evaluate his cardiac function with an echocardiogram.  Would also want to evaluate for ischemia.  With his body habitus I think a coronary CTA would probably be the best option.  Plan: Check 2D echo and coronary CTA --> Will need to prehydrate and premedicate for contrast hypersensitivity

## 2019-10-13 NOTE — Assessment & Plan Note (Signed)
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  I will ask him to hydrate with plenty of liquids on the night before morning of then after his CT scan.

## 2019-10-13 NOTE — Assessment & Plan Note (Signed)
Chest pain/precordial pain concerning for angina pectoris.  This is with exertion and dyspnea and is relatively sudden onset.  Plan: Ischemic evaluation with coronary CT scan with possible CT FFR.  This in conjunction with echocardiogram gives excellent anatomic and physiologic data.  He would not be able walk on a treadmill because of left knee meniscal tear.  Obesity with probably increased likelihood of artifact on Myoview.

## 2019-10-13 NOTE — Assessment & Plan Note (Signed)
Blood pressure is high today.  He says usually at home it is not this high.  Will need to reassess on follow-up.  He is only on low-dose HCTZ.  Would probably avoid ACE inhibitor or ARB given his essentially one functional kidney. A lot of this will depend on what we see on his cardiac evaluation.

## 2019-10-17 ENCOUNTER — Telehealth: Payer: Self-pay | Admitting: *Deleted

## 2019-10-17 NOTE — Telephone Encounter (Signed)
Spoke to patient.RN asked patient question-about being hypersensitive to iv contrast.  He states he has not had any xrays/ or scans that require contrast.     informed patient Dr Ellyn Hack wanted to make sure about contrast -.  RN thanked patient .

## 2019-10-17 NOTE — Telephone Encounter (Signed)
-----   Message from Leonie Man, MD sent at 10/13/2019  8:14 PM EST ----- Ivin Booty, can you check with this patient to make sure that he did not have a hypersensitivity to contrast.  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

## 2019-10-17 NOTE — Telephone Encounter (Signed)
Left message for patient to returns call in regards to below :    " can you check with this patient to make sure that he did not have a hypersensitivity to contrast. I know one of the couple people that I saw did an hour to make sure he was not one of them.   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 "  [facility]

## 2019-11-02 DIAGNOSIS — G2581 Restless legs syndrome: Secondary | ICD-10-CM | POA: Diagnosis not present

## 2019-11-02 DIAGNOSIS — G4700 Insomnia, unspecified: Secondary | ICD-10-CM | POA: Diagnosis not present

## 2019-11-02 DIAGNOSIS — I10 Essential (primary) hypertension: Secondary | ICD-10-CM | POA: Diagnosis not present

## 2019-11-02 DIAGNOSIS — I129 Hypertensive chronic kidney disease with stage 1 through stage 4 chronic kidney disease, or unspecified chronic kidney disease: Secondary | ICD-10-CM | POA: Diagnosis not present

## 2019-11-02 DIAGNOSIS — M25562 Pain in left knee: Secondary | ICD-10-CM | POA: Diagnosis not present

## 2019-11-02 DIAGNOSIS — F321 Major depressive disorder, single episode, moderate: Secondary | ICD-10-CM | POA: Diagnosis not present

## 2019-11-13 ENCOUNTER — Ambulatory Visit (HOSPITAL_COMMUNITY)
Admission: RE | Admit: 2019-11-13 | Discharge: 2019-11-13 | Disposition: A | Discharge status: Home / Self Care | Payer: Medicare Other | Source: Ambulatory Visit | Attending: Cardiology | Admitting: Cardiology

## 2019-11-13 ENCOUNTER — Other Ambulatory Visit: Payer: Self-pay

## 2019-11-13 DIAGNOSIS — R0600 Dyspnea, unspecified: Secondary | ICD-10-CM | POA: Diagnosis not present

## 2019-11-13 DIAGNOSIS — R0602 Shortness of breath: Secondary | ICD-10-CM | POA: Insufficient documentation

## 2019-11-13 DIAGNOSIS — R0609 Other forms of dyspnea: Secondary | ICD-10-CM

## 2019-11-13 DIAGNOSIS — I209 Angina pectoris, unspecified: Secondary | ICD-10-CM | POA: Insufficient documentation

## 2019-11-13 NOTE — Progress Notes (Signed)
*  PRELIMINARY RESULTS* Echocardiogram 2D Echocardiogram has been performed.  Samuel Germany 11/13/2019, 10:37 AM

## 2019-11-27 DIAGNOSIS — N1831 Chronic kidney disease, stage 3a: Secondary | ICD-10-CM | POA: Diagnosis not present

## 2019-11-27 DIAGNOSIS — R079 Chest pain, unspecified: Secondary | ICD-10-CM | POA: Diagnosis not present

## 2019-11-27 DIAGNOSIS — R0600 Dyspnea, unspecified: Secondary | ICD-10-CM | POA: Diagnosis not present

## 2019-11-28 LAB — BASIC METABOLIC PANEL
BUN/Creatinine Ratio: 11 (ref 10–24)
BUN: 16 mg/dL (ref 8–27)
CO2: 22 mmol/L (ref 20–29)
Calcium: 9.2 mg/dL (ref 8.6–10.2)
Chloride: 104 mmol/L (ref 96–106)
Creatinine, Ser: 1.51 mg/dL — ABNORMAL HIGH (ref 0.76–1.27)
GFR calc Af Amer: 53 mL/min/{1.73_m2} — ABNORMAL LOW (ref >59)
GFR calc non Af Amer: 46 mL/min/{1.73_m2} — ABNORMAL LOW (ref >59)
Glucose: 100 mg/dL — ABNORMAL HIGH (ref 65–99)
Potassium: 4.2 mmol/L (ref 3.5–5.2)
Sodium: 145 mmol/L — ABNORMAL HIGH (ref 134–144)

## 2019-12-01 ENCOUNTER — Other Ambulatory Visit (HOSPITAL_COMMUNITY): Payer: Medicare Other

## 2019-12-07 DIAGNOSIS — Z23 Encounter for immunization: Secondary | ICD-10-CM | POA: Diagnosis not present

## 2019-12-12 DIAGNOSIS — N1832 Chronic kidney disease, stage 3b: Secondary | ICD-10-CM | POA: Diagnosis not present

## 2019-12-12 DIAGNOSIS — D509 Iron deficiency anemia, unspecified: Secondary | ICD-10-CM | POA: Diagnosis not present

## 2019-12-12 DIAGNOSIS — N200 Calculus of kidney: Secondary | ICD-10-CM | POA: Diagnosis not present

## 2019-12-12 DIAGNOSIS — I129 Hypertensive chronic kidney disease with stage 1 through stage 4 chronic kidney disease, or unspecified chronic kidney disease: Secondary | ICD-10-CM | POA: Diagnosis not present

## 2019-12-15 DIAGNOSIS — E559 Vitamin D deficiency, unspecified: Secondary | ICD-10-CM | POA: Diagnosis not present

## 2019-12-15 DIAGNOSIS — N200 Calculus of kidney: Secondary | ICD-10-CM | POA: Diagnosis not present

## 2019-12-15 DIAGNOSIS — Z79899 Other long term (current) drug therapy: Secondary | ICD-10-CM | POA: Diagnosis not present

## 2019-12-15 DIAGNOSIS — I129 Hypertensive chronic kidney disease with stage 1 through stage 4 chronic kidney disease, or unspecified chronic kidney disease: Secondary | ICD-10-CM | POA: Diagnosis not present

## 2019-12-15 DIAGNOSIS — N1831 Chronic kidney disease, stage 3a: Secondary | ICD-10-CM | POA: Diagnosis not present

## 2019-12-15 DIAGNOSIS — Z7189 Other specified counseling: Secondary | ICD-10-CM | POA: Diagnosis not present

## 2019-12-15 DIAGNOSIS — E6609 Other obesity due to excess calories: Secondary | ICD-10-CM | POA: Diagnosis not present

## 2019-12-22 DIAGNOSIS — M25562 Pain in left knee: Secondary | ICD-10-CM | POA: Diagnosis not present

## 2019-12-26 ENCOUNTER — Telehealth: Payer: Self-pay | Admitting: Cardiology

## 2019-12-26 DIAGNOSIS — R0609 Other forms of dyspnea: Secondary | ICD-10-CM

## 2019-12-26 DIAGNOSIS — I208 Other forms of angina pectoris: Secondary | ICD-10-CM

## 2019-12-26 DIAGNOSIS — I2089 Other forms of angina pectoris: Secondary | ICD-10-CM

## 2019-12-26 DIAGNOSIS — R0602 Shortness of breath: Secondary | ICD-10-CM

## 2019-12-26 NOTE — Telephone Encounter (Signed)
-----   Message from Leonie Man, MD sent at 12/03/2019  2:30 PM EST ----- Chemistry panel looks pretty stable compared to last year.  Kidney function with a creatinine of 1.51 is essentially same as it had been before.  Would recommend aggressive oral hydration prior to CT scan.  ' If possible, would like to do 250 mL NS bolus prior to CT scan.  Glenetta Hew, MD

## 2019-12-26 NOTE — Telephone Encounter (Signed)
New message   Patient is calling because he has questions about having a CT cardiac. Please advise.

## 2019-12-26 NOTE — Telephone Encounter (Signed)
That makes sense, may be should probably just do Myoview.  Glenetta Hew, MD

## 2019-12-26 NOTE — Telephone Encounter (Signed)
Spoke to patient . Patient would like to cancel  testing per Kidney doctor - patient states he only has one kidney  and  per patient kidney doctor  does not think it is a good idea.  He wants to know what other options  for testing .  Rn informed patient will defer to Dr Ellyn Hack  and contact patient. Patient verbalized understanding.

## 2019-12-27 NOTE — Telephone Encounter (Signed)
CALLED  LEFT MESSAGE TO CALL BACK   CCTA CANCELLED, SCHEDULE FOR LEXISCAN  AND NEEDS F/U VISIT

## 2019-12-28 NOTE — Telephone Encounter (Signed)
Spoke to patient -  Information given that CCTA was cancelled  And scheduler will contact patient to schedule lexiscan and follow up visit with Dr Ellyn Hack.   verbalized understanding.

## 2020-01-02 ENCOUNTER — Ambulatory Visit (HOSPITAL_COMMUNITY): Payer: Medicare Other

## 2020-01-02 ENCOUNTER — Encounter (HOSPITAL_COMMUNITY): Payer: Medicare Other

## 2020-01-04 ENCOUNTER — Telehealth (HOSPITAL_COMMUNITY): Payer: Self-pay

## 2020-01-04 NOTE — Telephone Encounter (Signed)
Encounter complete. 

## 2020-01-05 DIAGNOSIS — Z23 Encounter for immunization: Secondary | ICD-10-CM | POA: Diagnosis not present

## 2020-01-09 ENCOUNTER — Ambulatory Visit (HOSPITAL_COMMUNITY)
Admission: RE | Admit: 2020-01-09 | Discharge: 2020-01-09 | Disposition: A | Discharge status: Home / Self Care | Payer: Medicare Other | Source: Ambulatory Visit | Attending: Cardiology | Admitting: Cardiology

## 2020-01-09 ENCOUNTER — Other Ambulatory Visit: Payer: Self-pay

## 2020-01-09 DIAGNOSIS — I208 Other forms of angina pectoris: Secondary | ICD-10-CM | POA: Diagnosis not present

## 2020-01-09 LAB — MYOCARDIAL PERFUSION IMAGING
LV dias vol: 81 mL (ref 62–150)
LV sys vol: 36 mL
Peak HR: 99 {beats}/min
Rest HR: 60 {beats}/min
SDS: 3
SRS: 2
SSS: 5
TID: 1.26

## 2020-01-09 MED ORDER — REGADENOSON 0.4 MG/5ML IV SOLN
0.4000 mg | Freq: Once | INTRAVENOUS | Status: AC
  Administered 2020-01-09: 0.4 mg via INTRAVENOUS

## 2020-01-09 MED ORDER — TECHNETIUM TC 99M TETROFOSMIN IV KIT
32.0000 | PACK | Freq: Once | INTRAVENOUS | Status: AC | PRN
  Administered 2020-01-09: 11:00:00 32 via INTRAVENOUS
  Filled 2020-01-09: qty 32

## 2020-01-09 MED ORDER — TECHNETIUM TC 99M TETROFOSMIN IV KIT
10.1000 | PACK | Freq: Once | INTRAVENOUS | Status: AC | PRN
  Administered 2020-01-09: 10.1 via INTRAVENOUS
  Filled 2020-01-09: qty 11

## 2020-01-24 ENCOUNTER — Ambulatory Visit: Payer: Medicare Other | Admitting: Podiatry

## 2020-02-08 ENCOUNTER — Ambulatory Visit: Payer: Medicare Other | Admitting: Podiatry

## 2020-02-12 DIAGNOSIS — I10 Essential (primary) hypertension: Secondary | ICD-10-CM | POA: Diagnosis not present

## 2020-02-12 DIAGNOSIS — G2581 Restless legs syndrome: Secondary | ICD-10-CM | POA: Diagnosis not present

## 2020-02-12 DIAGNOSIS — R7301 Impaired fasting glucose: Secondary | ICD-10-CM | POA: Diagnosis not present

## 2020-02-13 ENCOUNTER — Ambulatory Visit (INDEPENDENT_AMBULATORY_CARE_PROVIDER_SITE_OTHER): Payer: Medicare Other | Admitting: Cardiology

## 2020-02-13 ENCOUNTER — Other Ambulatory Visit: Payer: Self-pay

## 2020-02-13 ENCOUNTER — Encounter: Payer: Self-pay | Admitting: Cardiology

## 2020-02-13 VITALS — BP 128/68 | HR 78 | Temp 96.8°F | Ht 68.0 in | Wt 238.0 lb

## 2020-02-13 DIAGNOSIS — R0609 Other forms of dyspnea: Secondary | ICD-10-CM

## 2020-02-13 DIAGNOSIS — I209 Angina pectoris, unspecified: Secondary | ICD-10-CM

## 2020-02-13 DIAGNOSIS — R0789 Other chest pain: Secondary | ICD-10-CM

## 2020-02-13 DIAGNOSIS — R0600 Dyspnea, unspecified: Secondary | ICD-10-CM

## 2020-02-13 DIAGNOSIS — I10 Essential (primary) hypertension: Secondary | ICD-10-CM | POA: Diagnosis not present

## 2020-02-13 NOTE — Patient Instructions (Signed)
Medication Instructions:  The current medical regimen is effective;  continue present plan and medications as directed. Please refer to the Current Medication list given to you today. *If you need a refill on your cardiac medications before your next appointment, please ca Follow-Up: Your next appointment:  AS NEEDED  with Glenetta Hew, MD.  At [facility], you and your health needs are our priority.  As part of our continuing mission to provide you with exceptional heart care, we have created designated Provider Care Teams.  These Care Teams include your primary Cardiologist (physician) and Advanced Practice Providers (APPs -  Physician Assistants and Nurse Practitioners) who all work together to provide you with the care you need, when you need it.

## 2020-02-13 NOTE — Progress Notes (Signed)
Primary Care Provider: Celene Squibb, MD --> New PCP as of October. Cardiologist: Glenetta Hew, MD Electrophysiologist: None  Clinic Note: No chief complaint on file.   HPI:    [name] is a 69 y.o. male who is being seen today for the follow-up evaluation of chest pain and exertional dyspnea with weakness, originally seen in October 13, 2019 at the request of Celene Squibb, MD.  [name] was initially seen on Oct 13, 2019 -> noted symptoms that are building up since the summertime.  Indicated that up until July 2020 he was walking 2 to 3 miles a day briskly with no issues.  Only get short of breath if he rushed.  However after July was noted that he could not keep up.  Was noted profound dyspnea and chest tightness.  She did acknowledge that some of this could been related to a knee injury with meniscal tear that limit his walking.  He also has pretty significant restless leg type symptoms and some anxiety issues.  Also noted was an Epworth score of 20 having a started on CPAP.  Not able to sleep without using CPAP.  Recent Hospitalizations: None  Reviewed  CV studies:    The following studies were reviewed today: (if available, images/films reviewed: From Epic Chart or Care Everywhere) . Echocardiogram November 13, 2019: Normal.  EF 60 to 65%.  Normal wall motion.  Normal valves. . Myoview stress test: EF 56%.  No ischemia or infarction.  LOW risk. Gut/Diaphragmatic attenuation.    Interval History:   [name] returns here today amazingly feeling very much back to his baseline.  He said that Dr. Nevada Crane as a new physician seem to listen to some of his symptoms more and started on Cymbalta which is really helped his sleep and restless leg symptoms.  His fatigue is notably improved and overall his mood and energy seems to be better.  He feels a lot better not really had any of the chest tightness or pressure and exertional dyspnea has seemed to improve as well because  he is able to exercise more.  A lot of it had to do with just getting back into shape.  He says now that the weather is improved he is really try to get back into doing exercise and is hoping to test out his new energy level.  He is not noticing any significant palpitations or irregular heartbeats.  Still religiously using his CPAP.  CV Review of Symptoms (Summary): positive for - He still has some exertional dyspnea, but overall energy level and dyspnea in general has improved. negative for - chest pain, edema, irregular heartbeat, loss of consciousness, orthopnea, palpitations, paroxysmal nocturnal dyspnea, rapid heart rate, shortness of breath or Syncope/near syncope, TIA/amaurosis fugax, claudication  The patient does not have symptoms concerning for COVID-19 infection (fever, chills, cough, or new shortness of breath).  The patient is practicing social distancing & Masking.   He has had his 2 Covid vaccine shots but the second 1 being in March.  REVIEWED OF SYSTEMS   Review of Systems  Constitutional: Positive for malaise/fatigue (Notably improved energy levels.). Negative for weight loss (Actually gained weight, but is anxiously trying to lose weight now.).  HENT: Negative for congestion and nosebleeds.   Respiratory: Positive for cough (With pollen).   Gastrointestinal: Negative for blood in stool and melena.  Genitourinary: Negative for hematuria.  Musculoskeletal: Positive for joint pain (His knee still bothers him, better).  Neurological: Negative for dizziness.  Psychiatric/Behavioral: Negative for memory loss. The patient is not nervous/anxious (Much less anxious).        Overall seems better with Cymbalta   I have reviewed and (if needed) personally updated the patient's problem list, medications, allergies, past medical and surgical history, social and family history.   PAST MEDICAL HISTORY   Past Medical History:  Diagnosis Date  . Anemia   . Arthritis    spinal  stenosis  . History of kidney stones   . Hyperlipidemia   . Hypertension   . 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  . Sleep apnea    uses Cpap    PAST SURGICAL HISTORY   Past Surgical History:  Procedure Laterality Date  . CATARACT EXTRACTION     10-15 years ago  . COLONOSCOPY  05/25/2012   Procedure: COLONOSCOPY;  Surgeon: Rogene Houston, MD;  Location: [facility];  Service: Endoscopy;  Laterality: N/A;  1030  . COLONOSCOPY N/A 05/14/2017   Procedure: COLONOSCOPY;  Surgeon: Rogene Houston, MD;  Location: [facility];  Service: Endoscopy;  Laterality: N/A;  10:10  . CYSTOSCOPY/RETROGRADE/URETEROSCOPY Right 06/07/2014   Procedure: CYSTOSCOPY, RIGHT RETROGRADE, RIGHT URETEROSCOPY, RIGHT URETERAL BALLOON DILATATION, BIOPSY, RIGHT URETERAL STENT;  Surgeon: Jorja Loa, MD;  Location: [facility];  Service: Urology;  Laterality: Right;  . kidney stone  1983   Had partial right nephrectomy  . KNEE ARTHROSCOPY WITH MEDIAL MENISECTOMY Right 04/14/2018   Procedure: KNEE ARTHROSCOPY WITH MEDIAL MENISECTOMY;  Surgeon: Carole Civil, MD;  Location: [facility];  Service: Orthopedics;  Laterality: Right;  . left foot surgery  2010   repair of joint    MEDICATIONS/ALLERGIES   Current Meds  Medication Sig  . aspirin EC 81 MG tablet Take 81 mg by mouth every morning.  . DULoxetine (CYMBALTA) 30 MG capsule Take by mouth.  . gabapentin (NEURONTIN) 300 MG capsule Take 1 capsule (300 mg total) by mouth 3 (three) times daily.  Marland Kitchen gabapentin (NEURONTIN) 600 MG tablet Take 1/2 to 1 po qHS  . hydrochlorothiazide (MICROZIDE) 12.5 MG capsule Take 12.5 mg by mouth every morning.   Marland Kitchen HYDROcodone-acetaminophen (NORCO/VICODIN) 5-325 MG tablet Take 1 tablet by mouth every 8 (eight) hours as needed.  . metoprolol tartrate (LOPRESSOR) 50 MG tablet TAKE 100 MG ( 2 TABLETS) TWO HOUR PRIOR TO CARDIAC  TEST.  . pramipexole (MIRAPEX) 0.5 MG tablet Take at dinner and at bedtime  . QUEtiapine (SEROQUEL) 50 MG tablet Take 50 mg by mouth at bedtime as needed.  . zolpidem (AMBIEN) 10 MG tablet Take 1 tablet (10 mg total) by mouth at bedtime.    Allergies  Allergen Reactions  . Nsaids Other (See Comments)    "Cannot take because of my kidney disease."    SOCIAL HISTORY/FAMILY HISTORY   Reviewed in Epic:  Pertinent findings: Has felt a lot better since initiating care with Dr. Nevada Crane, and contributing a lot of his improved symptoms to him starting on Cymbalta.  He has gained a little weight over the last few months mostly because he has not been exercising yet.  OBJCTIVE -PE, EKG, labs   Wt Readings from Last 3 Encounters:  02/13/20 238 lb (108 kg)  01/09/20 231 lb (104.8 kg)  10/11/19 231 lb (104.8 kg)    Physical Exam: BP 128/68   Pulse 78   Temp (!) 96.8 F (  36 C)   Ht 5\' 8"  (1.727 m)   Wt 238 lb (108 kg)   SpO2 97%   BMI 36.19 kg/m  Physical Exam  Constitutional: He is oriented to person, place, and time. He appears well-developed and well-nourished. No distress.  Borderline morbidly obese.  Well-groomed.  HENT:  Head: Normocephalic and atraumatic.  Neck: No hepatojugular reflux and no JVD present. Carotid bruit is not present.  Cardiovascular: Normal rate, regular rhythm, S1 normal, S2 normal and intact distal pulses. PMI is not displaced (Difficult to palpate). Exam reveals distant heart sounds. Exam reveals no gallop and no friction rub.  No murmur heard. Pulmonary/Chest: Effort normal and breath sounds normal. No respiratory distress. He has no wheezes. He has no rales.  Musculoskeletal:        General: No edema. Normal range of motion.  Neurological: He is alert and oriented to person, place, and time.  Psychiatric: He has a normal mood and affect. His behavior is normal. Judgment and thought content normal.  Notably less pressured and anxious  Vitals reviewed.    Adult ECG Report  Recent Labs:     August 09, 2019: TC 205, TG 137, HDL 37.  CR 1.6.  A1c 5.5. Lab Results  Component Value Date   CHOL 183 05/14/2017   HDL 29 (L) 05/14/2017   LDLCALC 122 (H) 05/14/2017   TRIG 160 (H) 05/14/2017   CHOLHDL 6.3 05/14/2017     Lab Results  Component Value Date   CREATININE 1.51 (H) 11/27/2019   BUN 16 11/27/2019   NA 145 (H) 11/27/2019   K 4.2 11/27/2019   CL 104 11/27/2019   CO2 22 11/27/2019   No results found for: TSH  ASSESSMENT/PLAN   "[name]" returns today just to discuss the results of his stress test and echocardiogram both of which were very reassuring.  Completely normal.  No obvious evidence of any cardiac abnormality explain exertional dyspnea or chest pain.  I suspect this chest pain is probably noncardiac.  Especially since he is no longer having any.  I think his exertional dyspnea is probably related to deconditioning. His blood pressures pretty well controlled on HCTZ and metoprolol.  The fact that he is improved his symptoms significantly on Cymbalta would suggest that a lot of his symptoms are probably stress and anxiety related.  I simply recommended that he continue respect modification with diet and exercise.  I suspect that he probably would benefit from lipid-lowering if he is not able to reduce his cholesterol levels.  With obesity and hypertension in a gentleman with elevated triglycerides greater than 150, he is at risk with metabolic syndrome.  At present he seems to be relatively well controlled by his PCP.  He is happy with Dr. Nevada Crane and feels as though Dr. Nevada Crane can handle his hypertension and potential hyperlipidemia.  He will follow-up with [place] on an as-needed basis   Problem List Items Addressed This Visit    Essential (primary) hypertension (Chronic)   Chest pain, non-cardiac   DOE (dyspnea on exertion) - Primary       COVID-19 Education: The signs and symptoms of COVID-19 were discussed with the  patient and how to seek care for testing (follow up with PCP or arrange E-visit).   The importance of social distancing and COVID-19 vaccination was discussed today.  I spent a total of 18 minutes with the patient. >  50% of the time was spent in direct patient consultation. ->  We discussed his  symptoms and reviewed his study results.  He also explained his turnaround in overall symptoms and energy level. Additional time spent with chart review  / charting (studies, outside notes, etc): 10  Total Time: 28 min   Current medicines are reviewed at length with the patient today.  (+/- concerns) none  Notice: This dictation was prepared with Dragon dictation along with smaller phrase technology. Any transcriptional errors that result from this process are unintentional and may not be corrected upon review.  Patient Instructions / Medication Changes & Studies & Tests Ordered   Patient Instructions  Medication Instructions:  The current medical regimen is effective;  continue present plan and medications as directed. Please refer to the Current Medication list given to you today. *If you need a refill on your cardiac medications before your next appointment, please ca Follow-Up: Your next appointment:  AS NEEDED  with Glenetta Hew, MD.  At [facility], you and your health needs are our priority.  As part of our continuing mission to provide you with exceptional heart care, we have created designated Provider Care Teams.  These Care Teams include your primary Cardiologist (physician) and Advanced Practice Providers (APPs -  Physician Assistants and Nurse Practitioners) who all work together to provide you with the care you need, when you need it.      Studies Ordered:   No orders of the defined types were placed in this encounter.    Glenetta Hew, M.D., M.S. Interventional Cardiologist   Pager # [phone] Phone # [phone] [address] [place], [place] [address]    Thank you for choosing Heartcare at [facility]!!

## 2020-02-14 ENCOUNTER — Ambulatory Visit: Payer: Medicare Other | Admitting: Podiatry

## 2020-02-15 DIAGNOSIS — I129 Hypertensive chronic kidney disease with stage 1 through stage 4 chronic kidney disease, or unspecified chronic kidney disease: Secondary | ICD-10-CM | POA: Diagnosis not present

## 2020-02-15 DIAGNOSIS — E785 Hyperlipidemia, unspecified: Secondary | ICD-10-CM | POA: Diagnosis not present

## 2020-02-15 DIAGNOSIS — G2581 Restless legs syndrome: Secondary | ICD-10-CM | POA: Diagnosis not present

## 2020-02-15 DIAGNOSIS — F321 Major depressive disorder, single episode, moderate: Secondary | ICD-10-CM | POA: Diagnosis not present

## 2020-02-15 DIAGNOSIS — Z0001 Encounter for general adult medical examination with abnormal findings: Secondary | ICD-10-CM | POA: Diagnosis not present

## 2020-02-15 DIAGNOSIS — M25562 Pain in left knee: Secondary | ICD-10-CM | POA: Diagnosis not present

## 2020-02-15 DIAGNOSIS — N1831 Chronic kidney disease, stage 3a: Secondary | ICD-10-CM | POA: Diagnosis not present

## 2020-02-15 DIAGNOSIS — I10 Essential (primary) hypertension: Secondary | ICD-10-CM | POA: Diagnosis not present

## 2020-02-15 DIAGNOSIS — G4700 Insomnia, unspecified: Secondary | ICD-10-CM | POA: Diagnosis not present

## 2020-03-08 DIAGNOSIS — N1831 Chronic kidney disease, stage 3a: Secondary | ICD-10-CM | POA: Diagnosis not present

## 2020-03-08 DIAGNOSIS — Z79899 Other long term (current) drug therapy: Secondary | ICD-10-CM | POA: Diagnosis not present

## 2020-03-08 DIAGNOSIS — I129 Hypertensive chronic kidney disease with stage 1 through stage 4 chronic kidney disease, or unspecified chronic kidney disease: Secondary | ICD-10-CM | POA: Diagnosis not present

## 2020-03-08 DIAGNOSIS — E6609 Other obesity due to excess calories: Secondary | ICD-10-CM | POA: Diagnosis not present

## 2020-03-08 DIAGNOSIS — N200 Calculus of kidney: Secondary | ICD-10-CM | POA: Diagnosis not present

## 2020-03-13 DIAGNOSIS — N200 Calculus of kidney: Secondary | ICD-10-CM | POA: Diagnosis not present

## 2020-03-13 DIAGNOSIS — E559 Vitamin D deficiency, unspecified: Secondary | ICD-10-CM | POA: Diagnosis not present

## 2020-03-13 DIAGNOSIS — I129 Hypertensive chronic kidney disease with stage 1 through stage 4 chronic kidney disease, or unspecified chronic kidney disease: Secondary | ICD-10-CM | POA: Diagnosis not present

## 2020-03-13 DIAGNOSIS — E6609 Other obesity due to excess calories: Secondary | ICD-10-CM | POA: Diagnosis not present

## 2020-03-13 DIAGNOSIS — N1831 Chronic kidney disease, stage 3a: Secondary | ICD-10-CM | POA: Diagnosis not present

## 2020-03-18 DIAGNOSIS — M25562 Pain in left knee: Secondary | ICD-10-CM | POA: Diagnosis not present

## 2020-03-18 DIAGNOSIS — S83242D Other tear of medial meniscus, current injury, left knee, subsequent encounter: Secondary | ICD-10-CM | POA: Diagnosis not present

## 2020-03-18 DIAGNOSIS — M1712 Unilateral primary osteoarthritis, left knee: Secondary | ICD-10-CM | POA: Diagnosis not present

## 2020-04-30 ENCOUNTER — Ambulatory Visit (INDEPENDENT_AMBULATORY_CARE_PROVIDER_SITE_OTHER): Payer: Medicare Other | Admitting: Podiatry

## 2020-04-30 ENCOUNTER — Other Ambulatory Visit: Payer: Self-pay

## 2020-04-30 ENCOUNTER — Ambulatory Visit (INDEPENDENT_AMBULATORY_CARE_PROVIDER_SITE_OTHER): Payer: Medicare Other

## 2020-04-30 ENCOUNTER — Encounter: Payer: Self-pay | Admitting: Podiatry

## 2020-04-30 DIAGNOSIS — M778 Other enthesopathies, not elsewhere classified: Secondary | ICD-10-CM

## 2020-04-30 DIAGNOSIS — I209 Angina pectoris, unspecified: Secondary | ICD-10-CM

## 2020-04-30 NOTE — Progress Notes (Signed)
[name] presents today for follow-up for pain to the first metatarsophalangeal joint of his left foot.  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  States that recently he has noticed redness and swelling last about a week exquisitely tender and also has pain on the plantar aspect as he points to the sesamoid area of the joint.  Review of systems denies fever chills nausea vomiting muscle aches pains calf pain back pain chest pain shortness of breath.  Objective: Vital signs are stable alert oriented x3.  Pulses are palpable.  There is no erythema edema cellulitis drainage or odor no reproducible pain on palpation of the joint today he has good range of motion of the first metatarsophalangeal joint is not red or swollen.  He has some sharp pain on deep palpation of the sesamoids.  Radiographs taken today demonstrate Keller arthroplasty single silicone implant is intact sesamoids do not appear to be fractured in similar position as they were previously.  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  Plan: He only has 1 kidney so is very possible this could be gout and we cannot give him any nonsteroidals.  He states that this is no longer hurting he rather do nothing.   He calls complaining of pain even if we can get him in on the same day we need to get a CBC and an arthritic panel to rule out a seropositive arthropathy and gout.

## 2020-05-21 DIAGNOSIS — I10 Essential (primary) hypertension: Secondary | ICD-10-CM | POA: Diagnosis not present

## 2020-05-21 DIAGNOSIS — E785 Hyperlipidemia, unspecified: Secondary | ICD-10-CM | POA: Diagnosis not present

## 2020-05-29 DIAGNOSIS — E785 Hyperlipidemia, unspecified: Secondary | ICD-10-CM | POA: Diagnosis not present

## 2020-05-29 DIAGNOSIS — G4733 Obstructive sleep apnea (adult) (pediatric): Secondary | ICD-10-CM | POA: Diagnosis not present

## 2020-05-29 DIAGNOSIS — N1831 Chronic kidney disease, stage 3a: Secondary | ICD-10-CM | POA: Diagnosis not present

## 2020-05-29 DIAGNOSIS — I129 Hypertensive chronic kidney disease with stage 1 through stage 4 chronic kidney disease, or unspecified chronic kidney disease: Secondary | ICD-10-CM | POA: Diagnosis not present

## 2020-05-29 DIAGNOSIS — F321 Major depressive disorder, single episode, moderate: Secondary | ICD-10-CM | POA: Diagnosis not present

## 2020-05-29 DIAGNOSIS — M25562 Pain in left knee: Secondary | ICD-10-CM | POA: Diagnosis not present

## 2020-05-29 DIAGNOSIS — G2581 Restless legs syndrome: Secondary | ICD-10-CM | POA: Diagnosis not present

## 2020-05-29 DIAGNOSIS — G4700 Insomnia, unspecified: Secondary | ICD-10-CM | POA: Diagnosis not present

## 2020-06-10 IMAGING — MR MR KNEE*L* W/O CM
4 of 7 series · 13 of 40 positions shown · non-contrast
Comparison: May 09, 2014

CLINICAL DATA: Left knee pain for 4 months

EXAM:
MRI OF THE LEFT KNEE WITHOUT CONTRAST
TECHNIQUE: Multiplanar, multisequence MR imaging of the knee was performed. No
intravenous contrast was administered.

[Series 3: T2 fat-sat · axial · 4.0mm · 0.24mm/px · z∈[-68,+27]mm · 3 of 24 slices shown]
[im 5/24]
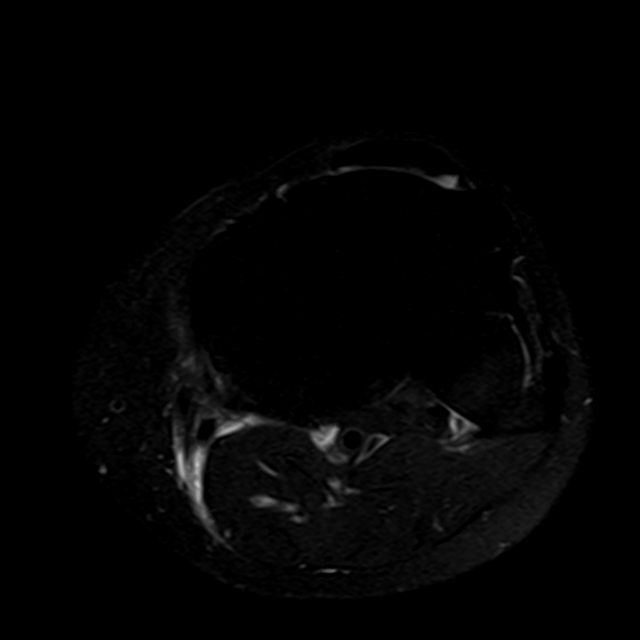
[im 14/24]
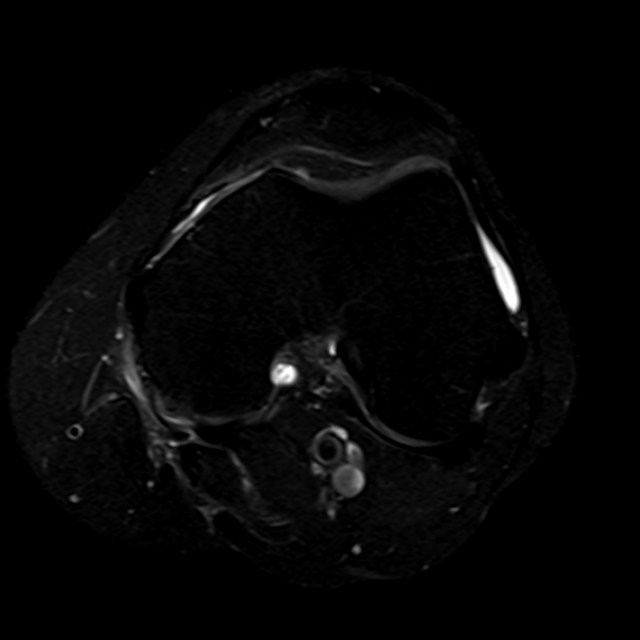
[im 24/24]
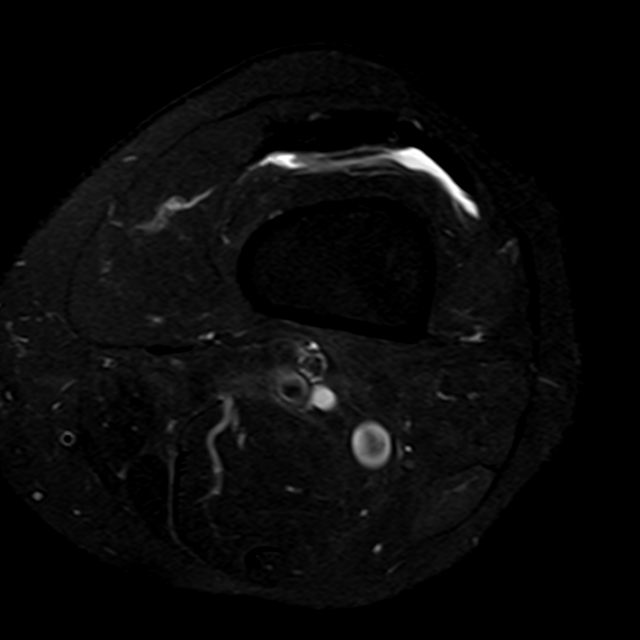

[Series 6: PD fat-sat · coronal · 4.0mm · 0.24mm/px · 4 of 28 slices shown (1 of 3)]
[im 1/28]
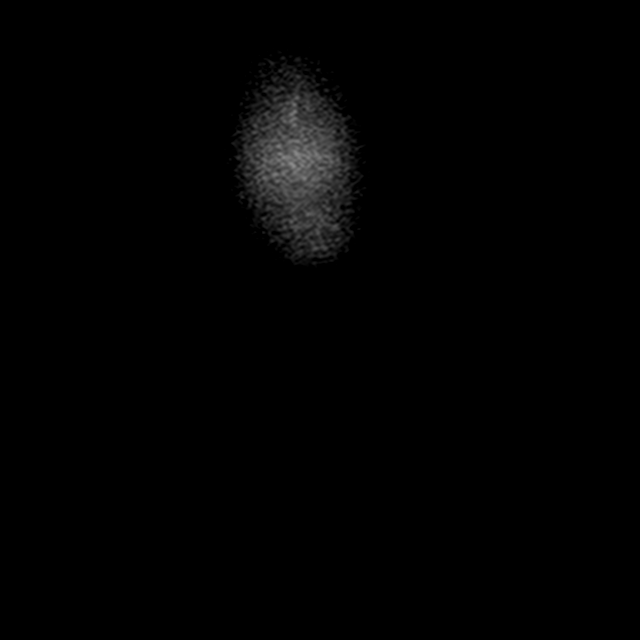
[im 6/28]
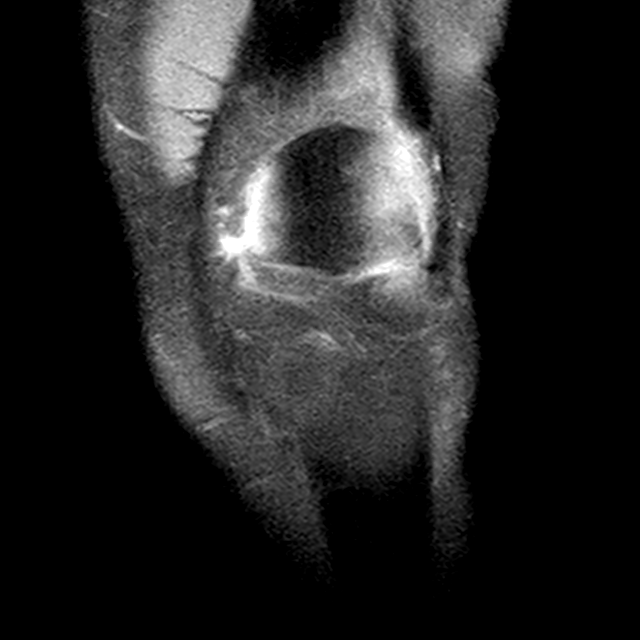
[im 17/28]
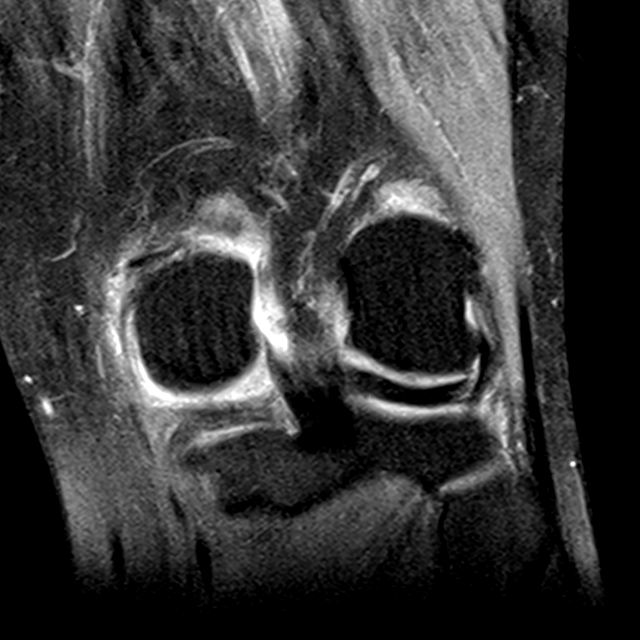
[im 28/28]
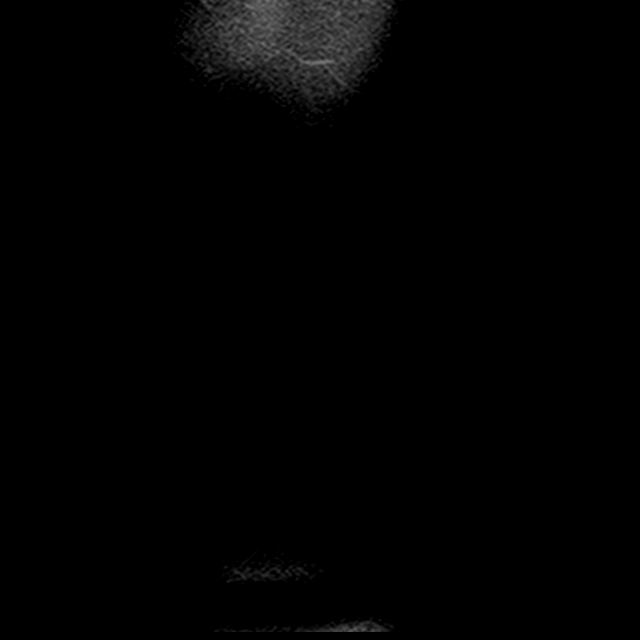

[Series 7: PD fat-sat · sagittal · 3.0mm · 0.22mm/px · 3 of 29 slices shown (2 of 3)]
[im 6/29]
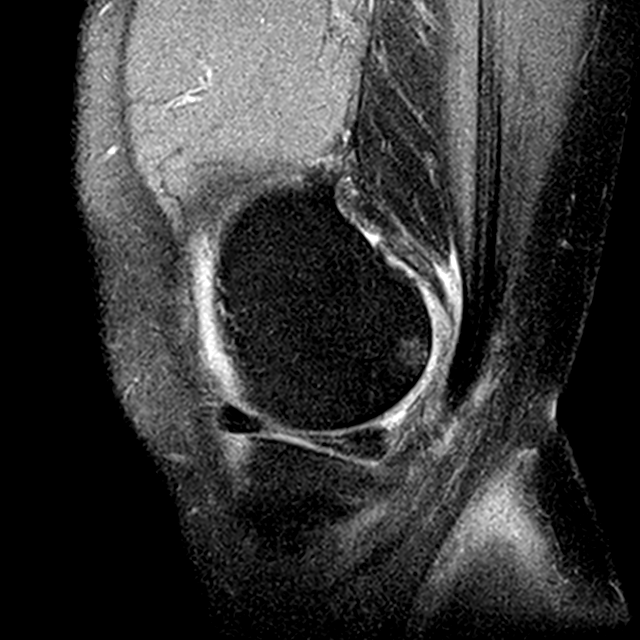
[im 17/29]
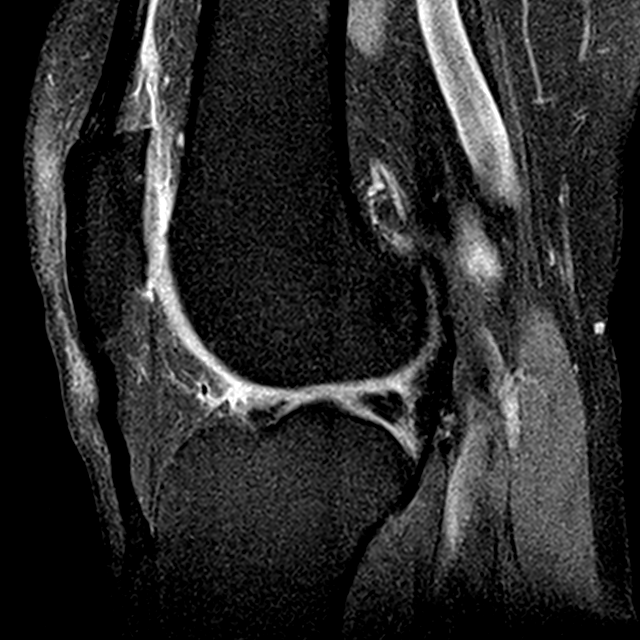
[im 29/29]
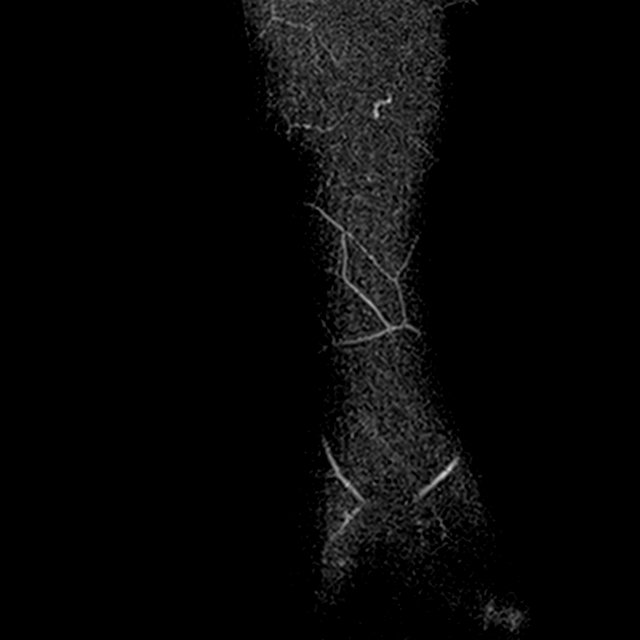

[Series 9: PD fat-sat · coronal · 2.0mm · 0.24mm/px · 3 of 15 slices shown (3 of 3)]
[im 1/15]
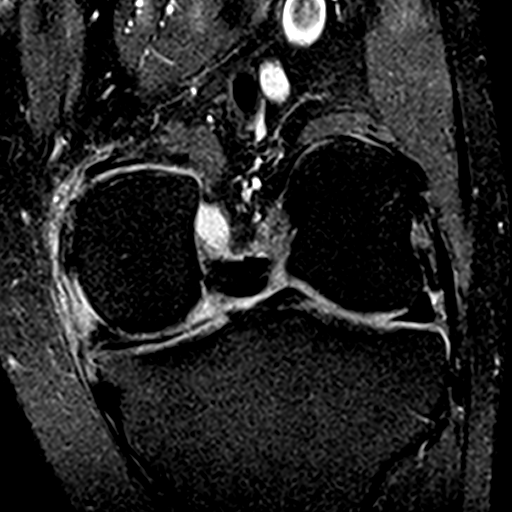
[im 8/15]
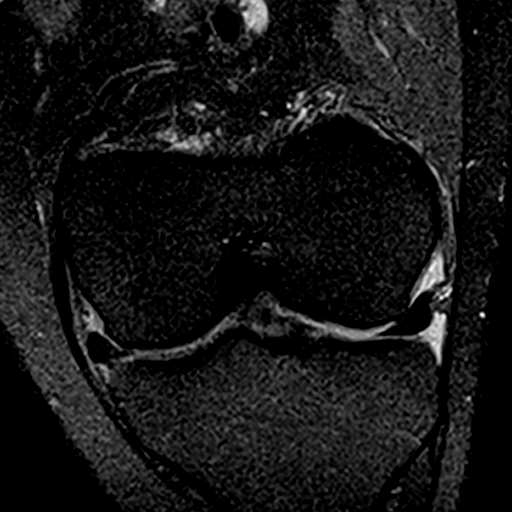
[im 15/15]
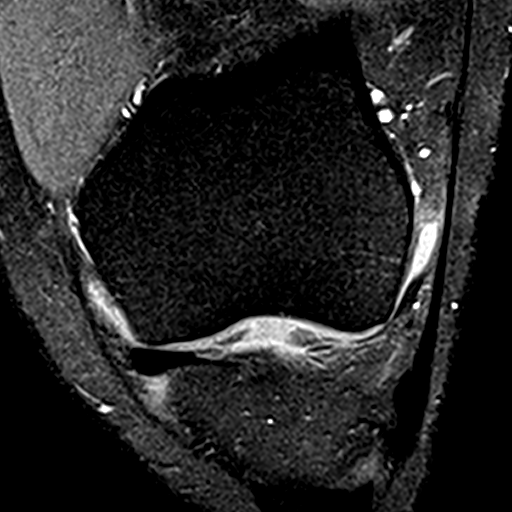

[13 of 40 positions shown; findings below may reference images not displayed]

FINDINGS: MENISCI

Medial: There is a nondisplaced horizontal longitudinal tear seen of
the posterior horn of the medial meniscus at the periphery. There is
undersurface fraying is well. There is slight extrusion of the mid
body.

Lateral: Intact.

LIGAMENTS

Cruciates: The ACL is intact. The PCL is intact.

Collaterals: There is increased signal seen around the superficial
fibers of the medial collateral ligament. The lateral collateral
ligamentous complex is intact.

CARTILAGE

Patellofemoral: Mild chondral thinning is seen throughout the
patellar surface.

Medial compartment: There is diffuse chondral thinning seen the
weight-bearing surface of the medial femoral condyle and medial
tibial plateau.

Lateral compartment: Mild chondral thinning seen the weight-bearing
surface of the lateral femoral condyle.

BONES: No fracture. No avascular necrosis. No pathologic marrow
infiltration.

JOINT: A small knee joint effusion is present. Normal Lienad
Mosarof. No plical thickening.

EXTENSOR MECHANISM: The patellar and quadriceps tendon are intact.
The retinaculum is unremarkable.

POPLITEAL FOSSA: A 3 cm loculated popliteal cyst is present. There
is evidence of recent partial rupture with fluid tracking around the
semimembranosus.

OTHER: The visualized muscles are normal in appearance. There is
increased intrasubstance signal seen at the insertion site of the
medial head of the gastrocnemius.
IMPRESSION: Nondisplaced tear of the posterior horn of the medial meniscus with
slight extrusion of the mid body.

Tricompartmental osteoarthritis most notable within the medial
patellofemoral compartment with moderate chondral disease

Small knee joint effusion

Grade 1 medial collateral ligamentous sprain

Loculated popliteal cyst with evidence of recent partial rupture.

Insertional medial head gastrocnemius tendinosis

## 2020-06-20 DIAGNOSIS — M25561 Pain in right knee: Secondary | ICD-10-CM | POA: Diagnosis not present

## 2020-06-20 DIAGNOSIS — M2392 Unspecified internal derangement of left knee: Secondary | ICD-10-CM | POA: Diagnosis not present

## 2020-06-20 DIAGNOSIS — M2391 Unspecified internal derangement of right knee: Secondary | ICD-10-CM | POA: Diagnosis not present

## 2020-06-20 DIAGNOSIS — M25562 Pain in left knee: Secondary | ICD-10-CM | POA: Diagnosis not present

## 2020-06-20 DIAGNOSIS — M238X2 Other internal derangements of left knee: Secondary | ICD-10-CM | POA: Diagnosis not present

## 2020-06-20 DIAGNOSIS — M238X1 Other internal derangements of right knee: Secondary | ICD-10-CM | POA: Diagnosis not present

## 2020-07-03 ENCOUNTER — Other Ambulatory Visit: Payer: Self-pay | Admitting: Neurology

## 2020-07-22 DIAGNOSIS — N1831 Chronic kidney disease, stage 3a: Secondary | ICD-10-CM | POA: Diagnosis not present

## 2020-07-22 DIAGNOSIS — E6609 Other obesity due to excess calories: Secondary | ICD-10-CM | POA: Diagnosis not present

## 2020-07-22 DIAGNOSIS — I129 Hypertensive chronic kidney disease with stage 1 through stage 4 chronic kidney disease, or unspecified chronic kidney disease: Secondary | ICD-10-CM | POA: Diagnosis not present

## 2020-07-22 DIAGNOSIS — E559 Vitamin D deficiency, unspecified: Secondary | ICD-10-CM | POA: Diagnosis not present

## 2020-07-22 DIAGNOSIS — N200 Calculus of kidney: Secondary | ICD-10-CM | POA: Diagnosis not present

## 2020-07-26 DIAGNOSIS — E559 Vitamin D deficiency, unspecified: Secondary | ICD-10-CM | POA: Diagnosis not present

## 2020-07-26 DIAGNOSIS — N1831 Chronic kidney disease, stage 3a: Secondary | ICD-10-CM | POA: Diagnosis not present

## 2020-07-26 DIAGNOSIS — I129 Hypertensive chronic kidney disease with stage 1 through stage 4 chronic kidney disease, or unspecified chronic kidney disease: Secondary | ICD-10-CM | POA: Diagnosis not present

## 2020-07-26 DIAGNOSIS — E6609 Other obesity due to excess calories: Secondary | ICD-10-CM | POA: Diagnosis not present

## 2020-08-12 DIAGNOSIS — R0982 Postnasal drip: Secondary | ICD-10-CM | POA: Diagnosis not present

## 2020-08-12 DIAGNOSIS — J343 Hypertrophy of nasal turbinates: Secondary | ICD-10-CM | POA: Diagnosis not present

## 2020-08-12 DIAGNOSIS — J310 Chronic rhinitis: Secondary | ICD-10-CM | POA: Diagnosis not present

## 2020-08-12 DIAGNOSIS — J342 Deviated nasal septum: Secondary | ICD-10-CM | POA: Diagnosis not present

## 2020-08-13 DIAGNOSIS — Z23 Encounter for immunization: Secondary | ICD-10-CM | POA: Diagnosis not present

## 2020-08-15 ENCOUNTER — Ambulatory Visit (INDEPENDENT_AMBULATORY_CARE_PROVIDER_SITE_OTHER): Payer: Medicare Other | Admitting: Adult Health

## 2020-08-15 ENCOUNTER — Encounter: Payer: Self-pay | Admitting: Adult Health

## 2020-08-15 ENCOUNTER — Telehealth: Payer: Self-pay | Admitting: Adult Health

## 2020-08-15 VITALS — BP 128/84 | Ht 68.0 in | Wt 227.8 lb

## 2020-08-15 DIAGNOSIS — G2581 Restless legs syndrome: Secondary | ICD-10-CM | POA: Diagnosis not present

## 2020-08-15 DIAGNOSIS — I209 Angina pectoris, unspecified: Secondary | ICD-10-CM

## 2020-08-15 NOTE — Progress Notes (Signed)
PATIENT: [name] DOB: 05/21/1951  REASON FOR VISIT: follow up HISTORY FROM: patient  HISTORY OF PRESENT ILLNESS: Today 08/15/20: [name] is a 69 year old male with a history of restless leg syndrome.  He returns today for follow-up.  He reports that he continues to take gabapentin, Mirapex and Ambien at bedtime.  Reports that this works well for his restless legs.  He denies any new symptoms.  Returns today for an evaluation.  HISTORY Update 09/18/2019: He is noting more RLS symptoms.   He also feels weaker in his legs from the knees down.    Some days they feel very heavy.    He stopped Elavil and is still on gabapentin 300 mg and Mirapex 0.5 mg and zolpidem 10 mg nightly.  [name] is retiring and is switching to [name].    RLS bothers him the most as soon as he lays down but symptoms start in the evening and occasionally early  REVIEW OF SYSTEMS: Out of a complete 14 system review of symptoms, the patient complains only of the following symptoms, and all other reviewed systems are negative.  See HPI  ALLERGIES: Allergies  Allergen Reactions  . Nsaids Other (See Comments)    "Cannot take because of my kidney disease."    HOME MEDICATIONS: Outpatient Medications Prior to Visit  Medication Sig Dispense Refill  . aspirin EC 81 MG tablet Take 81 mg by mouth every morning.    . DULoxetine (CYMBALTA) 30 MG capsule Take by mouth.    . gabapentin (NEURONTIN) 300 MG capsule Take 1 capsule (300 mg total) by mouth 3 (three) times daily. 270 capsule 3  . gabapentin (NEURONTIN) 600 MG tablet Take 1/2 to 1 po qHS 90 tablet 3  . hydrochlorothiazide (MICROZIDE) 12.5 MG capsule Take 12.5 mg by mouth every morning.     . pramipexole (MIRAPEX) 0.5 MG tablet Take at dinner and at bedtime 180 tablet 3  . QUEtiapine (SEROQUEL) 50 MG tablet Take 50 mg by mouth at bedtime as needed.    . zolpidem (AMBIEN) 10 MG tablet Take 1 tablet (10 mg total) by mouth at bedtime. 90 tablet 1  .  HYDROcodone-acetaminophen (NORCO/VICODIN) 5-325 MG tablet Take 1 tablet by mouth every 8 (eight) hours as needed.    . metoprolol tartrate (LOPRESSOR) 50 MG tablet TAKE 100 MG ( 2 TABLETS) TWO HOUR PRIOR TO CARDIAC TEST. 2 tablet 0  . rosuvastatin (CRESTOR) 10 MG tablet      No facility-administered medications prior to visit.    PAST MEDICAL HISTORY: Past Medical History:  Diagnosis Date  . Anemia   . Arthritis    spinal stenosis  . History of kidney stones   . Hyperlipidemia   . Hypertension   . 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  . Sleep apnea    uses Cpap    PAST SURGICAL HISTORY: Past Surgical History:  Procedure Laterality Date  . CATARACT EXTRACTION     10-15 years ago  . COLONOSCOPY  05/25/2012   Procedure: COLONOSCOPY;  Surgeon: Johnny Houston, MD;  Location: [facility];  Service: Endoscopy;  Laterality: N/A;  1030  . COLONOSCOPY N/A 05/14/2017   Procedure: COLONOSCOPY;  Surgeon: Johnny Houston, MD;  Location: [facility];  Service: Endoscopy;  Laterality: N/A;  10:10  . CYSTOSCOPY/RETROGRADE/URETEROSCOPY Right 06/07/2014   Procedure: CYSTOSCOPY, RIGHT RETROGRADE, RIGHT URETEROSCOPY, RIGHT URETERAL BALLOON DILATATION,  BIOPSY, RIGHT URETERAL STENT;  Surgeon: Johnny Loa, MD;  Location: [facility];  Service: Urology;  Laterality: Right;  . kidney stone  1983   Had partial right nephrectomy  . KNEE ARTHROSCOPY WITH MEDIAL MENISECTOMY Right 04/14/2018   Procedure: KNEE ARTHROSCOPY WITH MEDIAL MENISECTOMY;  Surgeon: Johnny Civil, MD;  Location: [facility];  Service: Orthopedics;  Laterality: Right;  . left foot surgery  2010   repair of joint    FAMILY HISTORY: Family History  Problem Relation Age of Onset  . Cancer Father        Bladder cancer  . Lung cancer Brother   . Healthy Mother   . Healthy Brother   . Healthy Brother     SOCIAL  HISTORY: Social History   Socioeconomic History  . Marital status: Married    Spouse name: Not on file  . Number of children: Not on file  . Years of education: Not on file  . Highest education level: Master's degree (e.g., MA, MS, MEng, MEd, MSW, MBA)  Occupational History  . Occupation: Retired    Comment: Medical illustrator for [employer]  . Smoking status: Never Smoker  . Smokeless tobacco: Never Used  Vaping Use  . Vaping Use: Never used  Substance and Sexual Activity  . Alcohol use: No    Comment: rare  . Drug use: No  . Sexual activity: Yes    Birth control/protection: None  Other Topics Concern  . Not on file  Social History Narrative   He is married.  Has no children.  Is retired Medical illustrator for [employer].  He has an occasional cigar but not anything routine.   December 2020: He said he used to walk about 2 to 3 miles a day with his wife, but now she keeps going and he has to stop because of dyspnea.   Social Determinants of Health   Financial Resource Strain:   . Difficulty of Paying Living Expenses: Not on file  Food Insecurity:   . Worried About Charity fundraiser in the Last Year: Not on file  . Ran Out of Food in the Last Year: Not on file  Transportation Needs:   . Lack of Transportation (Medical): Not on file  . Lack of Transportation (Non-Medical): Not on file  Physical Activity:   . Days of Exercise per Week: Not on file  . Minutes of Exercise per Session: Not on file  Stress:   . Feeling of Stress : Not on file  Social Connections:   . Frequency of Communication with Friends and Family: Not on file  . Frequency of Social Gatherings with Friends and Family: Not on file  . Attends Religious Services: Not on file  . Active Member of Clubs or Organizations: Not on file  . Attends Archivist Meetings: Not on file  . Marital Status: Not on file  Intimate Partner Violence:   . Fear of Current or Ex-Partner: Not on file  .  Emotionally Abused: Not on file  . Physically Abused: Not on file  . Sexually Abused: Not on file      PHYSICAL EXAM  Vitals:   08/15/20 1503  BP: 128/84  Weight: 227 lb 12.8 oz (103.3 kg)  Height: 5\' 8"  (1.727 m)   Body mass index is 34.64 kg/m.  Generalized: Well developed, in no acute distress   Neurological examination  Mentation: Alert oriented to time, place, history taking. Follows all commands speech  and language fluent Cranial nerve II-XII: Pupils were equal round reactive to light. Extraocular movements were full, visual field were full on confrontational test.  Head turning and shoulder shrug  were normal and symmetric. Motor: The motor testing reveals 5 over 5 strength of all 4 extremities. Good symmetric motor tone is noted throughout.  Sensory: Sensory testing is intact to soft touch on all 4 extremities. No evidence of extinction is noted.  Coordination: Cerebellar testing reveals good finger-nose-finger and heel-to-shin bilaterally.  Gait and station: Gait is normal.  Reflexes: Deep tendon reflexes are symmetric and normal bilaterally.   DIAGNOSTIC DATA (LABS, IMAGING, TESTING) - I reviewed patient records, labs, notes, testing and imaging myself where available.  Lab Results  Component Value Date   WBC 6.3 04/13/2018   HGB 13.3 04/13/2018   HCT 40.8 04/13/2018   MCV 90.9 04/13/2018   PLT 207 04/13/2018      Component Value Date/Time   NA 145 (H) 11/27/2019 1002   K 4.2 11/27/2019 1002   CL 104 11/27/2019 1002   CO2 22 11/27/2019 1002   GLUCOSE 100 (H) 11/27/2019 1002   GLUCOSE 118 (H) 04/13/2018 0822   BUN 16 11/27/2019 1002   CREATININE 1.51 (H) 11/27/2019 1002   CALCIUM 9.2 11/27/2019 1002   PROT 6.1 (L) 05/14/2017 0940   PROT 6.8 12/07/2016 1026   ALBUMIN 3.7 05/14/2017 0940   AST 23 05/14/2017 0940   ALT 21 05/14/2017 0940   ALKPHOS 53 05/14/2017 0940   BILITOT 0.8 05/14/2017 0940   GFRNONAA 46 (L) 11/27/2019 1002   GFRAA 53 (L)  11/27/2019 1002   Lab Results  Component Value Date   CHOL 183 05/14/2017   HDL 29 (L) 05/14/2017   LDLCALC 122 (H) 05/14/2017   TRIG 160 (H) 05/14/2017   CHOLHDL 6.3 05/14/2017      ASSESSMENT AND PLAN 69 y.o. year old male  has a past medical history of Anemia, Arthritis, History of kidney stones, Hyperlipidemia, Hypertension, Kidney function abnormal (1983), and Sleep apnea. here with:  1.  Restless leg syndrome  -Continue Requip, Ambien and gabapentin -Advised if symptoms worsen or he develops new symptoms he should let us know -Follow-up in 1 year or sooner if needed    I spent 25 minutes of face-to-face and non-face-to-face time with patient.  This included previsit chart review, lab review, study review, order entry, electronic health record documentation, patient education.  Ward Givens, MSN, NP-C 08/15/2020, 3:11 PM [facility] [address], [place], [place] [address] [phone]

## 2020-08-15 NOTE — Progress Notes (Signed)
I have read the note, and I agree with the clinical assessment and plan.  Trystian Crisanto A. Koltin Wehmeyer, MD, PhD, FAAN Certified in Neurology, Clinical Neurophysiology, Sleep Medicine, Pain Medicine and Neuroimaging  [facility] [address], [address] [place], [place] [address] [phone]  

## 2020-08-15 NOTE — Patient Instructions (Signed)
Your Plan:  Continue mirapex and gabapentin Continue Ambien  If your symptoms worsen or you develop new symptoms please let us know.    Thank you for coming to see [place] at [facility]. I hope we have been able to provide you high quality care today.  You may receive a patient satisfaction survey over the next few weeks. We would appreciate your feedback and comments so that we may continue to improve ourselves and the health of our patients.

## 2020-08-15 NOTE — Telephone Encounter (Signed)
Pt did not want to schedule yearly f/u at this time, will call back closer to next October.

## 2020-08-29 ENCOUNTER — Other Ambulatory Visit: Payer: Self-pay | Admitting: Neurology

## 2020-09-24 DIAGNOSIS — I129 Hypertensive chronic kidney disease with stage 1 through stage 4 chronic kidney disease, or unspecified chronic kidney disease: Secondary | ICD-10-CM | POA: Diagnosis not present

## 2020-09-24 DIAGNOSIS — N189 Chronic kidney disease, unspecified: Secondary | ICD-10-CM | POA: Diagnosis not present

## 2020-09-24 DIAGNOSIS — E7849 Other hyperlipidemia: Secondary | ICD-10-CM | POA: Diagnosis not present

## 2020-10-04 ENCOUNTER — Other Ambulatory Visit: Payer: Self-pay | Admitting: Neurology

## 2020-10-04 DIAGNOSIS — Z23 Encounter for immunization: Secondary | ICD-10-CM | POA: Diagnosis not present

## 2020-10-16 ENCOUNTER — Other Ambulatory Visit: Payer: Self-pay | Admitting: Neurology

## 2020-10-25 DIAGNOSIS — I129 Hypertensive chronic kidney disease with stage 1 through stage 4 chronic kidney disease, or unspecified chronic kidney disease: Secondary | ICD-10-CM | POA: Diagnosis not present

## 2020-10-25 DIAGNOSIS — E7849 Other hyperlipidemia: Secondary | ICD-10-CM | POA: Diagnosis not present

## 2020-10-25 DIAGNOSIS — N189 Chronic kidney disease, unspecified: Secondary | ICD-10-CM | POA: Diagnosis not present

## 2020-10-31 DIAGNOSIS — G4700 Insomnia, unspecified: Secondary | ICD-10-CM | POA: Diagnosis not present

## 2020-10-31 DIAGNOSIS — F321 Major depressive disorder, single episode, moderate: Secondary | ICD-10-CM | POA: Diagnosis not present

## 2020-10-31 DIAGNOSIS — M1712 Unilateral primary osteoarthritis, left knee: Secondary | ICD-10-CM | POA: Diagnosis not present

## 2020-10-31 DIAGNOSIS — M25562 Pain in left knee: Secondary | ICD-10-CM | POA: Diagnosis not present

## 2020-10-31 DIAGNOSIS — I129 Hypertensive chronic kidney disease with stage 1 through stage 4 chronic kidney disease, or unspecified chronic kidney disease: Secondary | ICD-10-CM | POA: Diagnosis not present

## 2020-10-31 DIAGNOSIS — E785 Hyperlipidemia, unspecified: Secondary | ICD-10-CM | POA: Diagnosis not present

## 2020-10-31 DIAGNOSIS — G2581 Restless legs syndrome: Secondary | ICD-10-CM | POA: Diagnosis not present

## 2020-10-31 DIAGNOSIS — N1831 Chronic kidney disease, stage 3a: Secondary | ICD-10-CM | POA: Diagnosis not present

## 2020-10-31 DIAGNOSIS — G4733 Obstructive sleep apnea (adult) (pediatric): Secondary | ICD-10-CM | POA: Diagnosis not present

## 2020-11-18 DIAGNOSIS — M1812 Unilateral primary osteoarthritis of first carpometacarpal joint, left hand: Secondary | ICD-10-CM | POA: Diagnosis not present

## 2020-11-23 DIAGNOSIS — M25562 Pain in left knee: Secondary | ICD-10-CM | POA: Diagnosis not present

## 2020-11-23 DIAGNOSIS — F321 Major depressive disorder, single episode, moderate: Secondary | ICD-10-CM | POA: Diagnosis not present

## 2020-11-23 DIAGNOSIS — I129 Hypertensive chronic kidney disease with stage 1 through stage 4 chronic kidney disease, or unspecified chronic kidney disease: Secondary | ICD-10-CM | POA: Diagnosis not present

## 2020-11-23 DIAGNOSIS — N1831 Chronic kidney disease, stage 3a: Secondary | ICD-10-CM | POA: Diagnosis not present

## 2020-11-23 DIAGNOSIS — G4733 Obstructive sleep apnea (adult) (pediatric): Secondary | ICD-10-CM | POA: Diagnosis not present

## 2020-11-23 DIAGNOSIS — E785 Hyperlipidemia, unspecified: Secondary | ICD-10-CM | POA: Diagnosis not present

## 2020-11-23 DIAGNOSIS — G4700 Insomnia, unspecified: Secondary | ICD-10-CM | POA: Diagnosis not present

## 2020-11-23 DIAGNOSIS — G2581 Restless legs syndrome: Secondary | ICD-10-CM | POA: Diagnosis not present

## 2020-12-04 ENCOUNTER — Other Ambulatory Visit: Payer: Self-pay | Admitting: Nurse Practitioner

## 2020-12-05 DIAGNOSIS — I129 Hypertensive chronic kidney disease with stage 1 through stage 4 chronic kidney disease, or unspecified chronic kidney disease: Secondary | ICD-10-CM | POA: Diagnosis not present

## 2020-12-05 DIAGNOSIS — N1831 Chronic kidney disease, stage 3a: Secondary | ICD-10-CM | POA: Diagnosis not present

## 2020-12-05 DIAGNOSIS — E559 Vitamin D deficiency, unspecified: Secondary | ICD-10-CM | POA: Diagnosis not present

## 2020-12-05 DIAGNOSIS — E6609 Other obesity due to excess calories: Secondary | ICD-10-CM | POA: Diagnosis not present

## 2020-12-18 DIAGNOSIS — N1831 Chronic kidney disease, stage 3a: Secondary | ICD-10-CM | POA: Diagnosis not present

## 2020-12-18 DIAGNOSIS — I129 Hypertensive chronic kidney disease with stage 1 through stage 4 chronic kidney disease, or unspecified chronic kidney disease: Secondary | ICD-10-CM | POA: Diagnosis not present

## 2020-12-18 DIAGNOSIS — N200 Calculus of kidney: Secondary | ICD-10-CM | POA: Diagnosis not present

## 2020-12-18 DIAGNOSIS — E6609 Other obesity due to excess calories: Secondary | ICD-10-CM | POA: Diagnosis not present

## 2020-12-23 DIAGNOSIS — M25511 Pain in right shoulder: Secondary | ICD-10-CM | POA: Diagnosis not present

## 2021-01-10 DIAGNOSIS — M25562 Pain in left knee: Secondary | ICD-10-CM | POA: Diagnosis not present

## 2021-01-10 DIAGNOSIS — M1712 Unilateral primary osteoarthritis, left knee: Secondary | ICD-10-CM | POA: Diagnosis not present

## 2021-01-22 DIAGNOSIS — M25562 Pain in left knee: Secondary | ICD-10-CM | POA: Diagnosis not present

## 2021-01-22 DIAGNOSIS — G4733 Obstructive sleep apnea (adult) (pediatric): Secondary | ICD-10-CM | POA: Diagnosis not present

## 2021-01-22 DIAGNOSIS — F321 Major depressive disorder, single episode, moderate: Secondary | ICD-10-CM | POA: Diagnosis not present

## 2021-01-22 DIAGNOSIS — G4700 Insomnia, unspecified: Secondary | ICD-10-CM | POA: Diagnosis not present

## 2021-01-22 DIAGNOSIS — E785 Hyperlipidemia, unspecified: Secondary | ICD-10-CM | POA: Diagnosis not present

## 2021-01-22 DIAGNOSIS — N1831 Chronic kidney disease, stage 3a: Secondary | ICD-10-CM | POA: Diagnosis not present

## 2021-01-31 ENCOUNTER — Other Ambulatory Visit: Payer: Self-pay | Admitting: Adult Health

## 2021-04-21 DIAGNOSIS — M170 Bilateral primary osteoarthritis of knee: Secondary | ICD-10-CM | POA: Diagnosis not present

## 2021-04-21 DIAGNOSIS — M1712 Unilateral primary osteoarthritis, left knee: Secondary | ICD-10-CM | POA: Diagnosis not present

## 2021-04-21 DIAGNOSIS — M25562 Pain in left knee: Secondary | ICD-10-CM | POA: Diagnosis not present

## 2021-04-21 DIAGNOSIS — M25561 Pain in right knee: Secondary | ICD-10-CM | POA: Diagnosis not present

## 2021-04-23 DIAGNOSIS — N1831 Chronic kidney disease, stage 3a: Secondary | ICD-10-CM | POA: Diagnosis not present

## 2021-04-23 DIAGNOSIS — N200 Calculus of kidney: Secondary | ICD-10-CM | POA: Diagnosis not present

## 2021-04-23 DIAGNOSIS — I129 Hypertensive chronic kidney disease with stage 1 through stage 4 chronic kidney disease, or unspecified chronic kidney disease: Secondary | ICD-10-CM | POA: Diagnosis not present

## 2021-04-23 DIAGNOSIS — E6609 Other obesity due to excess calories: Secondary | ICD-10-CM | POA: Diagnosis not present

## 2021-04-25 DIAGNOSIS — N1831 Chronic kidney disease, stage 3a: Secondary | ICD-10-CM | POA: Diagnosis not present

## 2021-04-25 DIAGNOSIS — E6609 Other obesity due to excess calories: Secondary | ICD-10-CM | POA: Diagnosis not present

## 2021-04-25 DIAGNOSIS — N200 Calculus of kidney: Secondary | ICD-10-CM | POA: Diagnosis not present

## 2021-04-25 DIAGNOSIS — I129 Hypertensive chronic kidney disease with stage 1 through stage 4 chronic kidney disease, or unspecified chronic kidney disease: Secondary | ICD-10-CM | POA: Diagnosis not present

## 2021-05-01 ENCOUNTER — Emergency Department (HOSPITAL_COMMUNITY): Payer: Medicare Other | Admitting: Certified Registered Nurse Anesthetist

## 2021-05-01 ENCOUNTER — Encounter (HOSPITAL_COMMUNITY): Payer: Self-pay | Admitting: Emergency Medicine

## 2021-05-01 ENCOUNTER — Other Ambulatory Visit: Payer: Self-pay

## 2021-05-01 ENCOUNTER — Emergency Department (HOSPITAL_COMMUNITY): Payer: Medicare Other

## 2021-05-01 ENCOUNTER — Ambulatory Visit (HOSPITAL_COMMUNITY)
Admission: EM | Admit: 2021-05-01 | Discharge: 2021-05-01 | Disposition: A | Discharge status: Home / Self Care | Payer: Medicare Other | Attending: Emergency Medicine | Admitting: Emergency Medicine

## 2021-05-01 ENCOUNTER — Encounter (HOSPITAL_COMMUNITY)
Admission: EM | Disposition: A | Discharge status: Home / Self Care | Payer: Self-pay | Source: Home / Self Care | Attending: Emergency Medicine

## 2021-05-01 DIAGNOSIS — S62631A Displaced fracture of distal phalanx of left index finger, initial encounter for closed fracture: Secondary | ICD-10-CM | POA: Diagnosis not present

## 2021-05-01 DIAGNOSIS — S68621A Partial traumatic transphalangeal amputation of left index finger, initial encounter: Secondary | ICD-10-CM | POA: Diagnosis not present

## 2021-05-01 DIAGNOSIS — S65012A Laceration of ulnar artery at wrist and hand level of left arm, initial encounter: Secondary | ICD-10-CM | POA: Diagnosis not present

## 2021-05-01 DIAGNOSIS — S61213A Laceration without foreign body of left middle finger without damage to nail, initial encounter: Secondary | ICD-10-CM | POA: Diagnosis not present

## 2021-05-01 DIAGNOSIS — S64493A Injury of digital nerve of left middle finger, initial encounter: Secondary | ICD-10-CM | POA: Diagnosis not present

## 2021-05-01 DIAGNOSIS — S68119A 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, initial encounter: Secondary | ICD-10-CM

## 2021-05-01 DIAGNOSIS — S68111A Complete traumatic metacarpophalangeal amputation of left index finger, initial encounter: Secondary | ICD-10-CM | POA: Diagnosis not present

## 2021-05-01 DIAGNOSIS — Z9989 Dependence on other enabling machines and devices: Secondary | ICD-10-CM | POA: Diagnosis not present

## 2021-05-01 DIAGNOSIS — Z79899 Other long term (current) drug therapy: Secondary | ICD-10-CM | POA: Diagnosis not present

## 2021-05-01 DIAGNOSIS — W312XXA Contact with powered woodworking and forming machines, initial encounter: Secondary | ICD-10-CM | POA: Diagnosis not present

## 2021-05-01 DIAGNOSIS — Z23 Encounter for immunization: Secondary | ICD-10-CM | POA: Insufficient documentation

## 2021-05-01 DIAGNOSIS — Z20822 Contact with and (suspected) exposure to covid-19: Secondary | ICD-10-CM | POA: Diagnosis not present

## 2021-05-01 DIAGNOSIS — S65112A Laceration of radial artery at wrist and hand level of left arm, initial encounter: Secondary | ICD-10-CM | POA: Diagnosis not present

## 2021-05-01 DIAGNOSIS — S68611A Complete traumatic transphalangeal amputation of left index finger, initial encounter: Secondary | ICD-10-CM | POA: Diagnosis not present

## 2021-05-01 DIAGNOSIS — S68113A Complete traumatic metacarpophalangeal amputation of left middle finger, initial encounter: Secondary | ICD-10-CM | POA: Diagnosis not present

## 2021-05-01 DIAGNOSIS — G4733 Obstructive sleep apnea (adult) (pediatric): Secondary | ICD-10-CM | POA: Diagnosis not present

## 2021-05-01 DIAGNOSIS — Z905 Acquired absence of kidney: Secondary | ICD-10-CM | POA: Diagnosis not present

## 2021-05-01 DIAGNOSIS — S66123A Laceration of flexor muscle, fascia and tendon of left middle finger at wrist and hand level, initial encounter: Secondary | ICD-10-CM | POA: Diagnosis not present

## 2021-05-01 DIAGNOSIS — I10 Essential (primary) hypertension: Secondary | ICD-10-CM | POA: Diagnosis not present

## 2021-05-01 DIAGNOSIS — S68623A Partial traumatic transphalangeal amputation of left middle finger, initial encounter: Secondary | ICD-10-CM | POA: Diagnosis not present

## 2021-05-01 HISTORY — PX: I & D EXTREMITY: SHX5045

## 2021-05-01 LAB — CBC WITH DIFFERENTIAL/PLATELET
Abs Immature Granulocytes: 0.06 10*3/uL (ref 0.00–0.07)
Basophils Absolute: 0.1 10*3/uL (ref 0.0–0.1)
Basophils Relative: 1 %
Eosinophils Absolute: 0.2 10*3/uL (ref 0.0–0.5)
Eosinophils Relative: 2 %
HCT: 42.6 % (ref 39.0–52.0)
Hemoglobin: 14 g/dL (ref 13.0–17.0)
Immature Granulocytes: 1 %
Lymphocytes Relative: 23 %
Lymphs Abs: 2.4 10*3/uL (ref 0.7–4.0)
MCH: 30 pg (ref 26.0–34.0)
MCHC: 32.9 g/dL (ref 30.0–36.0)
MCV: 91.2 fL (ref 80.0–100.0)
Monocytes Absolute: 0.9 10*3/uL (ref 0.1–1.0)
Monocytes Relative: 8 %
Neutro Abs: 6.9 10*3/uL (ref 1.7–7.7)
Neutrophils Relative %: 65 %
Platelets: 244 10*3/uL (ref 150–400)
RBC: 4.67 MIL/uL (ref 4.22–5.81)
RDW: 13 % (ref 11.5–15.5)
WBC: 10.6 10*3/uL — ABNORMAL HIGH (ref 4.0–10.5)
nRBC: 0 % (ref 0.0–0.2)

## 2021-05-01 LAB — RESP PANEL BY RT-PCR (FLU A&B, COVID) ARPGX2
Influenza A by PCR: NEGATIVE
Influenza B by PCR: NEGATIVE
SARS Coronavirus 2 by RT PCR: NEGATIVE

## 2021-05-01 LAB — BASIC METABOLIC PANEL
Anion gap: 8 (ref 5–15)
BUN: 25 mg/dL — ABNORMAL HIGH (ref 8–23)
CO2: 28 mmol/L (ref 22–32)
Calcium: 9.1 mg/dL (ref 8.9–10.3)
Chloride: 108 mmol/L (ref 98–111)
Creatinine, Ser: 1.81 mg/dL — ABNORMAL HIGH (ref 0.61–1.24)
GFR, Estimated: 39 mL/min — ABNORMAL LOW (ref >60)
Glucose, Bld: 100 mg/dL — ABNORMAL HIGH (ref 70–99)
Potassium: 3.5 mmol/L (ref 3.5–5.1)
Sodium: 144 mmol/L (ref 135–145)

## 2021-05-01 SURGERY — IRRIGATION AND DEBRIDEMENT EXTREMITY
Anesthesia: General | Site: Hand | Laterality: Left

## 2021-05-01 MED ORDER — ORAL CARE MOUTH RINSE: 15.0000 mL | Freq: Once | OROMUCOSAL | Status: AC

## 2021-05-01 MED ORDER — ROCURONIUM BROMIDE 100 MG/10ML IV SOLN
INTRAVENOUS | Status: DC | PRN
  Administered 2021-05-01: 50 mg via INTRAVENOUS

## 2021-05-01 MED ORDER — AMISULPRIDE (ANTIEMETIC) 5 MG/2ML IV SOLN: 10.0000 mg | Freq: Once | INTRAVENOUS | Status: DC | PRN

## 2021-05-01 MED ORDER — OXYCODONE-ACETAMINOPHEN 5-325 MG PO TABS: ORAL_TABLET | ORAL | 0 refills | Status: DC

## 2021-05-01 MED ORDER — HYDROMORPHONE HCL 1 MG/ML IJ SOLN
1.0000 mg | Freq: Once | INTRAMUSCULAR | Status: AC
  Administered 2021-05-01: 1 mg via INTRAVENOUS
  Filled 2021-05-01: qty 1

## 2021-05-01 MED ORDER — DOXYCYCLINE HYCLATE 50 MG PO CAPS: 100.0000 mg | ORAL_CAPSULE | Freq: Two times a day (BID) | ORAL | 0 refills | Status: DC

## 2021-05-01 MED ORDER — BUPIVACAINE HCL (PF) 0.25 % IJ SOLN
INTRAMUSCULAR | Status: AC
  Filled 2021-05-01: qty 30

## 2021-05-01 MED ORDER — HYDROMORPHONE HCL 1 MG/ML IJ SOLN: 0.5000 mg | Freq: Once | INTRAMUSCULAR | Status: AC

## 2021-05-01 MED ORDER — CEFAZOLIN SODIUM-DEXTROSE 1-4 GM/50ML-% IV SOLN
1.0000 g | Freq: Once | INTRAVENOUS | Status: AC
  Administered 2021-05-01: 1 g via INTRAVENOUS
  Filled 2021-05-01 (×2): qty 50

## 2021-05-01 MED ORDER — TETANUS-DIPHTH-ACELL PERTUSSIS 5-2.5-18.5 LF-MCG/0.5 IM SUSY
0.5000 mL | PREFILLED_SYRINGE | Freq: Once | INTRAMUSCULAR | Status: AC
  Administered 2021-05-01: 0.5 mL via INTRAMUSCULAR
  Filled 2021-05-01: qty 0.5

## 2021-05-01 MED ORDER — PROPOFOL 10 MG/ML IV BOLUS
INTRAVENOUS | Status: AC
  Filled 2021-05-01: qty 20

## 2021-05-01 MED ORDER — FENTANYL CITRATE (PF) 100 MCG/2ML IJ SOLN: 25.0000 ug | INTRAMUSCULAR | Status: DC | PRN

## 2021-05-01 MED ORDER — SUCCINYLCHOLINE CHLORIDE 20 MG/ML IJ SOLN
INTRAMUSCULAR | Status: DC | PRN
  Administered 2021-05-01: 120 mg via INTRAVENOUS

## 2021-05-01 MED ORDER — CHLORHEXIDINE GLUCONATE 0.12 % MT SOLN
OROMUCOSAL | Status: AC
  Administered 2021-05-01: 15 mL via OROMUCOSAL
  Filled 2021-05-01: qty 15

## 2021-05-01 MED ORDER — FENTANYL CITRATE (PF) 250 MCG/5ML IJ SOLN
INTRAMUSCULAR | Status: AC
  Filled 2021-05-01: qty 5

## 2021-05-01 MED ORDER — CHLORHEXIDINE GLUCONATE 0.12 % MT SOLN: 15.0000 mL | Freq: Once | OROMUCOSAL | Status: AC

## 2021-05-01 MED ORDER — ONDANSETRON HCL 4 MG/2ML IJ SOLN
INTRAMUSCULAR | Status: DC | PRN
  Administered 2021-05-01: 4 mg via INTRAVENOUS

## 2021-05-01 MED ORDER — 0.9 % SODIUM CHLORIDE (POUR BTL) OPTIME
TOPICAL | Status: DC | PRN
  Administered 2021-05-01: 300 mL
  Administered 2021-05-01: 1000 mL

## 2021-05-01 MED ORDER — LACTATED RINGERS IV SOLN: INTRAVENOUS | Status: DC

## 2021-05-01 MED ORDER — ONDANSETRON HCL 4 MG/2ML IJ SOLN
4.0000 mg | Freq: Once | INTRAMUSCULAR | Status: AC
  Administered 2021-05-01: 4 mg via INTRAVENOUS
  Filled 2021-05-01: qty 2

## 2021-05-01 MED ORDER — HYDROMORPHONE HCL 1 MG/ML IJ SOLN
INTRAMUSCULAR | Status: AC
  Administered 2021-05-01: 0.5 mg via INTRAVENOUS
  Filled 2021-05-01: qty 1

## 2021-05-01 MED ORDER — LIDOCAINE HCL (CARDIAC) PF 100 MG/5ML IV SOSY
PREFILLED_SYRINGE | INTRAVENOUS | Status: DC | PRN
  Administered 2021-05-01: 40 mg via INTRAVENOUS

## 2021-05-01 MED ORDER — FENTANYL CITRATE (PF) 100 MCG/2ML IJ SOLN
INTRAMUSCULAR | Status: DC | PRN
  Administered 2021-05-01: 50 ug via INTRAVENOUS
  Administered 2021-05-01: 100 ug via INTRAVENOUS

## 2021-05-01 MED ORDER — CEFAZOLIN SODIUM-DEXTROSE 2-3 GM-%(50ML) IV SOLR
INTRAVENOUS | Status: DC | PRN
  Administered 2021-05-01: 1 g via INTRAVENOUS

## 2021-05-01 MED ORDER — PHENYLEPHRINE HCL (PRESSORS) 10 MG/ML IV SOLN
INTRAVENOUS | Status: DC | PRN
  Administered 2021-05-01: 80 ug via INTRAVENOUS

## 2021-05-01 MED ORDER — SUGAMMADEX SODIUM 200 MG/2ML IV SOLN
INTRAVENOUS | Status: DC | PRN
  Administered 2021-05-01: 200 mg via INTRAVENOUS

## 2021-05-01 MED ORDER — LIDOCAINE HCL (PF) 1 % IJ SOLN
10.0000 mL | Freq: Once | INTRAMUSCULAR | Status: AC
  Administered 2021-05-01: 10 mL
  Filled 2021-05-01: qty 30

## 2021-05-01 MED ORDER — PROPOFOL 10 MG/ML IV BOLUS
INTRAVENOUS | Status: DC | PRN
  Administered 2021-05-01: 150 mg via INTRAVENOUS

## 2021-05-01 MED ORDER — BUPIVACAINE HCL (PF) 0.25 % IJ SOLN
INTRAMUSCULAR | Status: DC | PRN
  Administered 2021-05-01: 12 mL

## 2021-05-01 SURGICAL SUPPLY — 56 items
ADAPTER CATH SYR TO TUBING 38M (ADAPTER) ×2 IMPLANT
ADPR CATH LL SYR 3/32 TPR (ADAPTER) ×1
BAG COUNTER SPONGE SURGICOUNT (BAG) ×2 IMPLANT
BAG SPNG CNTER NS LX DISP (BAG) ×1
BNDG CMPR 9X4 STRL LF SNTH (GAUZE/BANDAGES/DRESSINGS)
BNDG COHESIVE 2X5 TAN STRL LF (GAUZE/BANDAGES/DRESSINGS) ×1 IMPLANT
BNDG ELASTIC 3X5.8 VLCR STR LF (GAUZE/BANDAGES/DRESSINGS) ×2 IMPLANT
BNDG ELASTIC 4X5.8 VLCR STR LF (GAUZE/BANDAGES/DRESSINGS) ×2 IMPLANT
BNDG ESMARK 4X9 LF (GAUZE/BANDAGES/DRESSINGS) IMPLANT
BNDG GAUZE ELAST 4 BULKY (GAUZE/BANDAGES/DRESSINGS) ×2 IMPLANT
CANNULA VESSEL 3MM 2 BLNT TIP (CANNULA) IMPLANT
CORD BIPOLAR FORCEPS 12FT (ELECTRODE) ×2 IMPLANT
COVER SURGICAL LIGHT HANDLE (MISCELLANEOUS) ×2 IMPLANT
CUFF TOURN SGL QUICK 18X4 (TOURNIQUET CUFF) IMPLANT
CUFF TOURN SGL QUICK 24 (TOURNIQUET CUFF)
CUFF TRNQT CYL 24X4X16.5-23 (TOURNIQUET CUFF) IMPLANT
DECANTER SPIKE VIAL GLASS SM (MISCELLANEOUS) ×2 IMPLANT
DRAIN PENROSE 1/4X12 LTX STRL (WOUND CARE) IMPLANT
DRSG PAD ABDOMINAL 8X10 ST (GAUZE/BANDAGES/DRESSINGS) ×4 IMPLANT
GAUZE SPONGE 4X4 12PLY STRL (GAUZE/BANDAGES/DRESSINGS) ×2 IMPLANT
GAUZE SPONGE 4X4 12PLY STRL LF (GAUZE/BANDAGES/DRESSINGS) ×1 IMPLANT
GAUZE XEROFORM 1X8 LF (GAUZE/BANDAGES/DRESSINGS) ×2 IMPLANT
GLOVE SRG 8 PF TXTR STRL LF DI (GLOVE) ×1 IMPLANT
GLOVE SURG ENC MOIS LTX SZ7.5 (GLOVE) ×2 IMPLANT
GLOVE SURG ORTHO LTX SZ8 (GLOVE) IMPLANT
GLOVE SURG POLY ORTHO LF SZ8 (GLOVE) IMPLANT
GLOVE SURG UNDER POLY LF SZ8 (GLOVE) ×2
GLOVE SURG UNDER POLY LF SZ8.5 (GLOVE) IMPLANT
GOWN STRL REUS W/ TWL LRG LVL3 (GOWN DISPOSABLE) ×1 IMPLANT
GOWN STRL REUS W/ TWL XL LVL3 (GOWN DISPOSABLE) ×1 IMPLANT
GOWN STRL REUS W/TWL LRG LVL3 (GOWN DISPOSABLE) ×2
GOWN STRL REUS W/TWL XL LVL3 (GOWN DISPOSABLE) ×2
KIT BASIN OR (CUSTOM PROCEDURE TRAY) ×2 IMPLANT
KIT TURNOVER KIT B (KITS) ×2 IMPLANT
LOOP VESSEL MAXI BLUE (MISCELLANEOUS) IMPLANT
MANIFOLD NEPTUNE II (INSTRUMENTS) IMPLANT
NDL HYPO 25X1 1.5 SAFETY (NEEDLE) IMPLANT
NEEDLE HYPO 25X1 1.5 SAFETY (NEEDLE) IMPLANT
NS IRRIG 1000ML POUR BTL (IV SOLUTION) ×2 IMPLANT
PACK ORTHO EXTREMITY (CUSTOM PROCEDURE TRAY) ×2 IMPLANT
PAD ARMBOARD 7.5X6 YLW CONV (MISCELLANEOUS) ×4 IMPLANT
SET CYSTO W/LG BORE CLAMP LF (SET/KITS/TRAYS/PACK) IMPLANT
SOL PREP POV-IOD 4OZ 10% (MISCELLANEOUS) ×4 IMPLANT
SPONGE T-LAP 4X18 ~~LOC~~+RFID (SPONGE) ×2 IMPLANT
SUT ETHILON 4 0 P 3 18 (SUTURE) IMPLANT
SUT ETHILON 4 0 PS 2 18 (SUTURE) IMPLANT
SUT MON AB 5-0 P3 18 (SUTURE) ×3 IMPLANT
SWAB COLLECTION DEVICE MRSA (MISCELLANEOUS) IMPLANT
SWAB CULTURE ESWAB REG 1ML (MISCELLANEOUS) IMPLANT
SYR 20ML LL LF (SYRINGE) ×2 IMPLANT
SYR CONTROL 10ML LL (SYRINGE) IMPLANT
TOWEL GREEN STERILE (TOWEL DISPOSABLE) ×2 IMPLANT
TUBE CONNECTING 12X1/4 (SUCTIONS) ×2 IMPLANT
TUBE FEEDING ENTERAL 5FR 16IN (TUBING) IMPLANT
UNDERPAD 30X36 HEAVY ABSORB (UNDERPADS AND DIAPERS) ×2 IMPLANT
YANKAUER SUCT BULB TIP NO VENT (SUCTIONS) ×2 IMPLANT

## 2021-05-01 NOTE — Anesthesia Preprocedure Evaluation (Signed)
Anesthesia Evaluation  Patient identified by MRN, date of birth, ID band Patient awake    Reviewed: Allergy & Precautions, NPO status , Patient's Chart, lab work & pertinent test results  Airway Mallampati: II  TM Distance: >3 FB     Dental  (+) Dental Advisory Given   Pulmonary sleep apnea ,    breath sounds clear to auscultation       Cardiovascular hypertension, Pt. on medications  Rhythm:Regular Rate:Normal     Neuro/Psych negative neurological ROS     GI/Hepatic negative GI ROS, Neg liver ROS,   Endo/Other  negative endocrine ROS  Renal/GU CRFRenal diseaseS/p right partial nephrectomy     Musculoskeletal  (+) Arthritis ,   Abdominal   Peds  Hematology negative hematology ROS (+)   Anesthesia Other Findings   Reproductive/Obstetrics                             Anesthesia Physical Anesthesia Plan  ASA: 2  Anesthesia Plan: General   Post-op Pain Management:    Induction: Intravenous  PONV Risk Score and Plan: 2 and Dexamethasone, Ondansetron and Treatment may vary due to age or medical condition  Airway Management Planned: Oral ETT  Additional Equipment:   Intra-op Plan:   Post-operative Plan: Extubation in OR  Informed Consent: I have reviewed the patients History and Physical, chart, labs and discussed the procedure including the risks, benefits and alternatives for the proposed anesthesia with the patient or authorized representative who has indicated his/her understanding and acceptance.     Dental advisory given  Plan Discussed with: CRNA  Anesthesia Plan Comments:         Anesthesia Quick Evaluation

## 2021-05-01 NOTE — ED Notes (Signed)
Paged hand surgery

## 2021-05-01 NOTE — Op Note (Addendum)
NAME: [name] MEDICAL RECORD NO: [id number] DATE OF BIRTH: 06-Nov-1950 FAC[facility] LOCATION: [facility] OR PHYSICIAN: Tennis Must, MD   OPERATIVE REPORT   DATE OF PROCEDURE: 05/01/21    PREOPERATIVE DIAGNOSIS: Left index long and ring finger tablesaw injury   POSTOPERATIVE DIAGNOSIS: 1.  Left index finger amputation 2.  Left long finger tablesaw injury with FDP and FDS laceration, radial and ulnar digital nerve and artery lacerations, open middle phalanx fracture 3.  Left ring finger nailbed injury   PROCEDURE: 1.  Left index finger revision amputation 2.  Left long finger amputation through PIP joint   SURGEON:  Leanora Cover, M.D.   ASSISTANT: none   ANESTHESIA:  General   INTRAVENOUS FLUIDS:  Per anesthesia flow sheet.   ESTIMATED BLOOD LOSS:  Minimal.   COMPLICATIONS:  None.   SPECIMENS:  none   TOURNIQUET TIME:    Total Tourniquet Time Documented: Upper Arm (Left) - 52 minutes Total: Upper Arm (Left) - 52 minutes    DISPOSITION:  Stable to PACU.   INDICATIONS: 70 year old male states he sustained injury to his left hand while using a table saw earlier today.  He was seen at [facility] emergency department where he was found to have amputation of the index finger of the left hand as well as saw injury to the left long finger with inability to flex the finger.  He also had no sensation and no capillary refill.  Was transferred to [facility] for further care.  We discussed treatment options.  Recommended revision amputation of the index finger.  We discussed attempts at salvage of the long finger versus amputation.  He chose to proceed with amputation given the significance of the injury and anticipated functional recovery and rehabilitation requirements.  Risks, benefits and alternatives of surgery were discussed including the risks of blood loss, infection, damage to nerves, vessels, tendons, ligaments, bone for surgery, need for additional surgery,  complications with wound healing, continued pain,  stiffness.  He voiced understanding of these risks and elected to proceed.  OPERATIVE COURSE:  After being identified preoperatively by myself,  the patient and I agreed on the procedure and site of the procedure.  The surgical site was marked.  Surgical consent had been signed. He was given IV antibiotics as preoperative antibiotic prophylaxis. He was transferred to the operating room and placed on the operating table in supine position with the Left upper extremity on an arm board.  General anesthesia was induced by the anesthesiologist.  Left upper extremity was prepped and draped in normal sterile orthopedic fashion.  A surgical pause was performed between the surgeons, anesthesia, and operating room staff and all were in agreement as to the patient, procedure, and site of procedure.  Tourniquet at the proximal aspect of the extremity was inflated to 250 mmHg after exsanguination of the arm with an Esmarch bandage.  The wounds were explored.  There was the base of the middle phalanx of the index finger middle phalanx bone remaining.  There is no gross contamination.  In the long finger there was laceration of the FDP and FDS tendons.  There was laceration of the radial digital nerve and artery as well as the ulnar digital nerve and artery.  There was fracture through the base of the middle phalanx into the PIP joint.  There was no gross contamination.  In the ring finger there is small piece of nail missing distally and laceration into the nailbed but not through the  nailbed.  The wounds were all copiously irrigated with sterile saline.  The long finger was amputated through the PIP joint.  The knife was used to release the ligaments and extensor tendon as well as dorsal skin.  The digital nerves were identified placed under traction bipolared and allowed to retract.  The digital arteries were bipolared at their ends.  The skin edges were taken back to viable  skin using the 15 blade.  In the index finger again the digital nerves were placed under traction bipolared and allowed to retract.  The digital arteries were treated with bipolar at their ends.  The volar base of the middle phalanx of the long finger was also excised using the 15 blade.  The rondure was used to round off the ends of the condyles of the proximal phalanx of the long finger.  The rongeurs were also used to round off the fractured end of the middle phalanx and the index finger.  The wounds were again copiously irrigated with sterile saline.  5-0 Monocryl suture was used to reapproximate the skin edges of the index finger.  Good reapproximation was obtained.  In the long finger 5-0 Monocryl suture was again used to reapproximate the skin edges.  Good tension-free reapproximation was obtained.  Digital blocks were performed with quarter percent plain Marcaine to aid in postoperative analgesia.  The wounds were dressed with sterile Xeroform 4 x 4's and wrapped lightly with a Kerlix bandage and Coban bandage.  A Band-Aid was placed over the nailbed injury of the ring finger.  The tourniquet was deflated at 52 minutes.  Fingertips were pink with brisk capillary refill after deflation of tourniquet.  The operative  drapes were broken down.  The patient was awoken from anesthesia safely.  He was transferred back to the stretcher and taken to PACU in stable condition.  I will see him back in the office in 1 week for postoperative followup.  I will give him a prescription for Percocet 5/325 1-2 tabs PO q6 hours prn pain, dispense # 20 and doxycycline 100 mg p.o. twice daily x7 days .  Debridement type: Excisional Debridement  Side: left  Body Location: index and long fingers   Tools used for debridement: scalpel and scissors  Debridement depth beyond dead/damaged tissue down to healthy viable tissue: yes  Tissue layer involved: skin, subcutaneous tissue, muscle / fascia, bone  Nature of tissue  removed: Devitalized Tissue and Non-viable tissue  Irrigation fluid type: Normal Saline    Leanora Cover, MD Electronically signed, 05/01/21

## 2021-05-01 NOTE — ED Triage Notes (Signed)
Pt cut off left index finger with table saw and cut left middle finger x 30 mins ago; EDP at bedside for [facility]

## 2021-05-01 NOTE — ED Notes (Signed)
L hand wrapped with wet gauze and iced per order

## 2021-05-01 NOTE — ED Notes (Signed)
Report received from [place]

## 2021-05-01 NOTE — ED Notes (Signed)
Report to charge nurse at [facility] for ED to ED transfer

## 2021-05-01 NOTE — ED Notes (Signed)
Report to [place] with [place]

## 2021-05-01 NOTE — ED Provider Notes (Signed)
  Physical Exam  BP 129/78 (BP Location: Right Arm)   Pulse 78   Temp 98.4 F (36.9 C) (Oral)   Resp 16   Ht 5\' 8"  (1.727 m)   Wt 99.8 kg   SpO2 95%   BMI 33.45 kg/m   Physical Exam  ED Course/Procedures   Clinical Course as of 05/01/21 2340  Thu Jul 07, 10539  3949 70 year old male right-hand-dominant here for injuries of left second and third digits after contact with table saw.  Occurred just prior to arrival.  Patient will need x-rays IV antibiotics tetanus update if needed and hand consultation. [MB]  1845 I spoke with Dr. Gaynelle Arabian. He will see patient in the pre-op area. [AW]    Clinical Course User Index [AW] Arnaldo Natal, MD [MB] Hayden Rasmussen, MD    Procedures  MDM  Mr. Ardine Eng has just arrived in the emergency department via EMS.  N.p.o. since 12 PM.  Order to consult hand surgery has been placed.       Arnaldo Natal, MD 05/01/21 [phone]

## 2021-05-01 NOTE — ED Provider Notes (Signed)
[facility] EMERGENCY DEPARTMENT Provider Note   CSN: [id number] Arrival date & time: 05/01/21  1624     History Chief Complaint  Patient presents with   Laceration    [name] is a 70 y.o. male.  HPI  Patient with significant medical history of arthritis, hypertension presents to the with chief complaint of index and middle finger laceration.  Patient states approximately 30 minutes ago he was using a table saw and unfortunately cut his left hand, he cut the second digit distally to the PIP joint, and partially amputated the third digit at the PIP joint.  He states he has decreased sensation in his third digit unable to bend it at the PIP joint, he is not immunocompromise, does not remember  the last time he had a tetanus shot, he is right-handed.  He has no other complaints at this time.  He does not endorse headaches, fevers, chills, chest pain or shortness of breath.  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   Sleep apnea    uses Cpap    Patient Active Problem List   Diagnosis Date Noted   Chest pain, non-cardiac 10/11/2019   DOE (dyspnea on exertion) 10/11/2019   Abnormal heart sounds 10/11/2019   Pain in left knee 09/20/2019   Iron deficiency anemia 09/07/2019   Pain in thumb joint with movement of left hand 53/97/6734   Metabolic bone disease 19/37/9024   Renal stone 04/25/2019   Pain of left hand 03/23/2019   Insomnia 09/14/2018   S/P right knee arthroscopy 04/14/18    Absolute anemia 04/05/2017   Restless leg syndrome 12/07/2016   Numbness 12/07/2016   OSA on CPAP 12/07/2016   Spinal stenosis of cervical region 12/07/2016   Spinal stenosis of lumbar region 06/18/2014   CTS (carpal tunnel syndrome) 12/20/2012   Medial meniscus,  posterior horn derangement 12/20/2012   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   Stage 3 chronic kidney disease ([place]) 11/22/2011   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   Essential (primary) hypertension 11/22/2011    Past Surgical History:  Procedure Laterality Date   CATARACT EXTRACTION     10-15 years ago   COLONOSCOPY  05/25/2012   Procedure: COLONOSCOPY;  Surgeon: Rogene Houston, MD;  Location: [facility];  Service: Endoscopy;  Laterality: N/A;  1030   COLONOSCOPY N/A 05/14/2017   Procedure: COLONOSCOPY;  Surgeon: Rogene Houston, MD;  Location: [facility];  Service: Endoscopy;  Laterality: N/A;  10:10   CYSTOSCOPY/RETROGRADE/URETEROSCOPY Right 06/07/2014   Procedure: CYSTOSCOPY, RIGHT RETROGRADE, RIGHT URETEROSCOPY, RIGHT URETERAL BALLOON DILATATION, BIOPSY, RIGHT URETERAL STENT;  Surgeon: Jorja Loa, MD;  Location: [facility];  Service: Urology;  Laterality: Right;   kidney stone  1983   Had partial right nephrectomy   KNEE ARTHROSCOPY WITH MEDIAL MENISECTOMY Right 04/14/2018   Procedure: KNEE ARTHROSCOPY WITH MEDIAL MENISECTOMY;  Surgeon: Carole Civil, MD;  Location: [facility];  Service: Orthopedics;  Laterality: Right;   left foot surgery  2010   repair of joint       Family History  Problem Relation Age of Onset   Cancer Father        Bladder cancer   Lung cancer Brother  Healthy Mother    Healthy Brother    Healthy Brother     Social History   Tobacco Use   Smoking status: Never   Smokeless tobacco: Never  Vaping Use   Vaping Use: Never used  Substance Use Topics   Alcohol use: No    Comment: rare   Drug use: No    Home Medications Prior to Admission medications   Medication Sig Start Date End Date Taking? Authorizing Provider  aspirin EC 81 MG tablet Take 81 mg by mouth every morning.    [provider]  DULoxetine (CYMBALTA) 30 MG capsule Take by mouth.    [provider]  gabapentin (NEURONTIN) 300 MG  capsule TAKE 1 CAPSULE BY MOUTH THREE TIMES DAILY. 02/05/21   Ward Givens, NP  gabapentin (NEURONTIN) 600 MG tablet Take 1/2 to 1 po qHS 09/14/18   [name], Nanine Means, MD  hydrochlorothiazide (MICROZIDE) 12.5 MG capsule Take 12.5 mg by mouth every morning.     [provider]  pramipexole (MIRAPEX) 0.5 MG tablet TAKE 1 TABLET AT [facility] AND 1 TABLET AT BEDTIME. 10/16/20   Ward Givens, NP  QUEtiapine (SEROQUEL) 50 MG tablet Take 50 mg by mouth at bedtime as needed. 01/26/20   [provider]  zolpidem (AMBIEN) 10 MG tablet TAKE 1 TABLET BY MOUTH AT BEDTIME AS NEEDED FOR SLEEP. 08/29/20   Ward Givens, NP    Allergies    Nsaids  Review of Systems   Review of Systems  Physical Exam Updated Vital Signs BP (!) 165/95 (BP Location: Right Arm)   Pulse 90   Temp 98.2 F (36.8 C)   Resp 20   Ht 5\' 8"  (1.727 m)   Wt 99.8 kg   SpO2 95%   BMI 33.45 kg/m   Physical Exam Vitals and nursing note reviewed.  Constitutional:      General: He is not in acute distress.    Appearance: Normal appearance. He is not ill-appearing or diaphoretic.  HENT:     Head: Normocephalic and atraumatic.     Nose: No congestion or rhinorrhea.  Eyes:     General:        Right eye: No discharge.        Left eye: No discharge.     Conjunctiva/sclera: Conjunctivae normal.  Pulmonary:     Effort: Pulmonary effort is normal. No respiratory distress.     Breath sounds: Normal breath sounds. No wheezing.  Musculoskeletal:     Cervical back: Neck supple.     Right lower leg: No edema.     Left lower leg: No edema.     Comments: Patient's left hand was visualized he has a full amputation of the second digit distally to the PIP joint, and partial amputation of the third digit distally to the PIP joint.  He is able to move at his MCP joints at his second and third digit, unable to move at his PIP or DIP joint of the third digit, his third digit is dusky in appearance, he has decreased sensation  on the dorsal aspect and has no sensation on the palmar aspect of the finger distally from the laceration.  Skin:    General: Skin is warm and dry.     Coloration: Skin is not jaundiced or pale.  Neurological:     Mental Status: He is alert and oriented to person, place, and time.  Psychiatric:        Mood and Affect: Mood normal.  ED Results / Procedures / Treatments   Labs (all labs ordered are listed, but only abnormal results are displayed) Labs Reviewed  BASIC METABOLIC PANEL - Abnormal; Notable for the following components:      Result Value   Glucose, Bld 100 (*)    BUN 25 (*)    Creatinine, Ser 1.81 (*)    GFR, Estimated 39 (*)    All other components within normal limits  CBC WITH DIFFERENTIAL/PLATELET - Abnormal; Notable for the following components:   WBC 10.6 (*)    All other components within normal limits  RESP PANEL BY RT-PCR (FLU A&B, COVID) [id number]    EKG None  Radiology DG Hand Complete Left  Result Date: 05/01/2021 CLINICAL DATA:  Amputation of second and third digit with table saw. EXAM: LEFT HAND - COMPLETE 3+ VIEW COMPARISON:  None. FINDINGS: Osseous and soft tissue amputation of the second digit at the middle phalanx. Fracture through the third digit middle phalanx with dorsal displacement of distal fracture fragment. There may be intra-articular extension to the proximal interphalangeal joint. No overlying dressing is in place. No radiopaque foreign body. No additional fracture of the hand. 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 IMPRESSION: 1. Osseous and soft tissue amputation of the second digit at the middle phalanx. 2. Fracture through the third digit middle phalanx with dorsal displacement of distal fracture fragment and possible intra-articular extension to the proximal interphalangeal joint. Electronically Signed   By: Keith Rake M.D.   On: 05/01/2021 17:33    Procedures .Nerve Block  Date/Time:  05/01/2021 5:33 PM Performed by: Marcello Fennel, PA-C Authorized by: Marcello Fennel, PA-C   Consent:    Consent obtained:  Verbal   Consent given by:  Patient   Risks, benefits, and alternatives were discussed: yes     Risks discussed:  Allergic reaction, infection, nerve damage, swelling, unsuccessful block, pain, intravenous injection and bleeding   Alternatives discussed:  No treatment Universal protocol:    Patient identity confirmed:  Verbally with patient Indications:    Indications:  Pain relief Location:    Body area:  Upper extremity   Upper extremity nerve blocked: Digital block second and third digit.   Laterality:  Left Pre-procedure details:    Skin preparation:  Alcohol Skin anesthesia:    Skin anesthesia method:  None Procedure details:    Block needle gauge:  24 G   Anesthetic injected:  Lidocaine 1% w/o epi   Steroid injected:  None   Additive injected:  None Post-procedure details:    Dressing:  None   Outcome:  Pain relieved   Procedure completion:  Tolerated well, no immediate complications   Medications Ordered in ED Medications  ceFAZolin (ANCEF) IVPB 1 g/50 mL premix (has no administration in time range)  HYDROmorphone (DILAUDID) injection 1 mg (1 mg Intravenous Given 05/01/21 1634)  Tdap (BOOSTRIX) injection 0.5 mL (0.5 mLs Intramuscular Given 05/01/21 1640)  lidocaine (PF) (XYLOCAINE) 1 % injection 10 mL (10 mLs Infiltration Given by Other 05/01/21 1659)  HYDROmorphone (DILAUDID) injection 0.5 mg (0.5 mg Intravenous Given 05/01/21 1655)    ED Course  I have reviewed the triage vital signs and the nursing notes.  Pertinent labs & imaging results that were available during my care of the patient were reviewed by me and considered in my medical decision making (see chart for details).  Clinical Course as of 05/01/21 1741  Thu May 02, 5443  [id number] 70 year old male right-hand-dominant here for  injuries of left second and third digits after contact  with table saw.  Occurred just prior to arrival.  Patient will need x-rays IV antibiotics tetanus update if needed and hand consultation. [MB]    Clinical Course User Index [MB] Hayden Rasmussen, MD   MDM Rules/Calculators/A&P                         Initial impression-patient presents with a amputation of the left index finger and partial imitation of the long finger.  He is alert, does not appear in distress, vital signs reassuring.  Will obtain imaging, start patient on antibiotics, update tetanus shot, speak with hand surgery for further evaluation.  Work-up-x-ray of left hand reveals amputation of the second digit at the middle phalanx, fracture through the third distal middle phalanx with dorsal displacement of the fracture fragment.   Reassessment-patient was given Dilaudid but continues to have severe pain, will attempt a digital block for increased pain relief.  Patient states digital block has helped with his pain, Has no other complaints at this time.  Patient is agreement with plan by hand surgery.   Consult-spoke with Dr. Fredna Dow of hand surgery, he would like patient transferred ED to ED to [facility] and plan for surgery upon his arrival.  Spoke with Dr. Noemi Chapel at [facility] who will accept the patient in transfer.  Rule out-low suspicion for systemic infection as patient is nontoxic-appearing, vital signs reassuring.  Low suspicion for compartment syndrome as compartments are soft to the touch, long finger does look dusky with absent capillary refill decreased sensation but I suspect this is secondary due to cut nerves and blood vessels on the palmar aspect of the finger.  Plan-ED to ED for evaluation by hand surgery, Dr. Fredna Dow.    Final Clinical Impression(s) / ED Diagnoses Final diagnoses:  Amputation of index finger  Laceration of left middle finger without foreign body without damage to nail, initial encounter    Rx / DC Orders ED Discharge Orders      None        Marcello Fennel, PA-C 05/01/21 1742    Hayden Rasmussen, MD 05/02/21 [phone]

## 2021-05-01 NOTE — Discharge Instructions (Signed)

## 2021-05-01 NOTE — H&P (Addendum)
[name] is an 70 y.o. male.   Chief Complaint: Tablesaw injury left hand HPI: 70 year old right-hand-dominant male states he injured his left hand on a table saw this afternoon.  He has sustained amputation of the index finger and injury to the long finger.  There is small laceration in the ring finger.  He reports previous injury to the left hand.  No other injuries at this time.  He was seen initially at [facility] emergency department and evaluated and transferred to [facility] for further care.  Was noted at that time to have no sensation in the long finger and no capillary refill.  Case discussed with Deno Etienne, PA-C and his note from 05/01/2021 reviewed. Xrays viewed and interpreted by me: AP lateral bleak views left hand show amputation index finger through the base of the middle phalanx.  There is fracture at the base of the middle phalanx of the long finger. 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   Sleep apnea    uses Cpap    Past Surgical History:  Procedure Laterality Date   CATARACT EXTRACTION     10-15 years ago   COLONOSCOPY  05/25/2012   Procedure: COLONOSCOPY;  Surgeon: Rogene Houston, MD;  Location: [facility] ENDO SUITE;  Service: Endoscopy;  Laterality: N/A;  1030   COLONOSCOPY N/A 05/14/2017   Procedure: COLONOSCOPY;  Surgeon: Rogene Houston, MD;  Location: [facility];  Service: Endoscopy;  Laterality: N/A;  10:10   CYSTOSCOPY/RETROGRADE/URETEROSCOPY Right 06/07/2014   Procedure: CYSTOSCOPY, RIGHT RETROGRADE, RIGHT URETEROSCOPY, RIGHT URETERAL BALLOON DILATATION, BIOPSY, RIGHT URETERAL STENT;  Surgeon: Jorja Loa, MD;  Location: [facility];  Service:  Urology;  Laterality: Right;   kidney stone  1983   Had partial right nephrectomy   KNEE ARTHROSCOPY WITH MEDIAL MENISECTOMY Right 04/14/2018   Procedure: KNEE ARTHROSCOPY WITH MEDIAL MENISECTOMY;  Surgeon: Carole Civil, MD;  Location: [facility] ORS;  Service: Orthopedics;  Laterality: Right;   left foot surgery  2010   repair of joint    Family History: Family History  Problem Relation Age of Onset   Cancer Father        Bladder cancer   Lung cancer Brother    Healthy Mother    Healthy Brother    Healthy Brother     Social History:   reports that he has never smoked. He has never used smokeless tobacco. He reports that he does not drink alcohol and does not use drugs.  Medications: Medications Prior to Admission  Medication Sig Dispense Refill   DULoxetine (CYMBALTA) 30 MG capsule Take by mouth.     gabapentin (NEURONTIN) 300 MG capsule TAKE 1 CAPSULE BY MOUTH THREE TIMES DAILY. (Patient taking differently: Take 300 mg by mouth 2 (two) times daily.) 270 capsule 0   hydrochlorothiazide (MICROZIDE) 12.5 MG capsule Take 12.5 mg by mouth every morning.      pramipexole (MIRAPEX) 0.5 MG tablet TAKE 1 TABLET AT DINNER AND 1 TABLET AT BEDTIME. (Patient taking differently: Take 0.5 mg by mouth every evening. TAKE 1 TABLET AT DINNER AND 1 TABLET AT BEDTIME.) 180 tablet 1   QUEtiapine (  SEROQUEL) 50 MG tablet Take 50 mg by mouth at bedtime as needed (sleep).      Results for orders placed or performed during the hospital encounter of 05/01/21 (from the past 48 hour(s))  Resp Panel by RT-PCR (Flu A&B, Covid) Nasopharyngeal Swab     Status: None   Collection Time: 05/01/21  4:32 PM   Specimen: Nasopharyngeal Swab; Nasopharyngeal(NP) swabs in vial transport medium  Result Value Ref Range   SARS Coronavirus 2 by RT PCR NEGATIVE NEGATIVE    Comment: (NOTE) SARS-CoV-2 target nucleic acids are NOT DETECTED.  The SARS-CoV-2 RNA is generally detectable in upper respiratory specimens during the  acute phase of infection. The lowest concentration of SARS-CoV-2 viral copies this assay can detect is 138 copies/mL. A negative result does not preclude SARS-Cov-2 infection and should not be used as the sole basis for treatment or other patient management decisions. A negative result may occur with  improper specimen collection/handling, submission of specimen other than nasopharyngeal swab, presence of viral mutation(s) within the areas targeted by this assay, and inadequate number of viral copies(<138 copies/mL). A negative result must be combined with clinical observations, patient history, and epidemiological information. The expected result is Negative.  Fact Sheet for Patients:  EntrepreneurPulse.com.au  Fact Sheet for Healthcare Providers:  IncredibleEmployment.be  This test is no t yet approved or cleared by the [place] FDA and  has been authorized for detection and/or diagnosis of SARS-CoV-2 by FDA under an Emergency Use Authorization (EUA). This EUA will remain  in effect (meaning this test can be used) for the duration of the COVID-19 declaration under Section 564(b)(1) of the Act, 21 U.S.C.section 360bbb-3(b)(1), unless the authorization is terminated  or revoked sooner.       Influenza A by PCR NEGATIVE NEGATIVE   Influenza B by PCR NEGATIVE NEGATIVE    Comment: (NOTE) The Xpert Xpress SARS-CoV-2/FLU/RSV plus assay is intended as an aid in the diagnosis of influenza from Nasopharyngeal swab specimens and should not be used as a sole basis for treatment. Nasal washings and aspirates are unacceptable for Xpert Xpress SARS-CoV-2/FLU/RSV testing.  Fact Sheet for Patients: EntrepreneurPulse.com.au  Fact Sheet for Healthcare Providers: IncredibleEmployment.be  This test is not yet approved or cleared by the [place] FDA and has been authorized for detection and/or diagnosis of  SARS-CoV-2 by FDA under an Emergency Use Authorization (EUA). This EUA will remain in effect (meaning this test can be used) for the duration of the COVID-19 declaration under Section 564(b)(1) of the Act, 21 U.S.C. section 360bbb-3(b)(1), unless the authorization is terminated or revoked.  Performed at [facility], [address] [place], [place] [address]   Basic metabolic panel     Status: Abnormal   Collection Time: 05/01/21  4:35 PM  Result Value Ref Range   Sodium 144 135 - 145 mmol/L   Potassium 3.5 3.5 - 5.1 mmol/L   Chloride 108 98 - 111 mmol/L   CO2 28 22 - 32 mmol/L   Glucose, Bld 100 (H) 70 - 99 mg/dL    Comment: Glucose reference range applies only to samples taken after fasting for at least 8 hours.   BUN 25 (H) 8 - 23 mg/dL   Creatinine, Ser 1.81 (H) 0.61 - 1.24 mg/dL   Calcium 9.1 8.9 - 10.3 mg/dL   GFR, Estimated 39 (L) >60 mL/min    Comment: (NOTE) Calculated using the CKD-EPI Creatinine Equation (2021)    Anion gap 8 5 - 15  Comment: Performed at [facility], [address] [place], [place] [address]  CBC with Differential     Status: Abnormal   Collection Time: 05/01/21  4:35 PM  Result Value Ref Range   WBC 10.6 (H) 4.0 - 10.5 K/uL   RBC 4.67 4.22 - 5.81 MIL/uL   Hemoglobin 14.0 13.0 - 17.0 g/dL   HCT 42.6 39.0 - 52.0 %   MCV 91.2 80.0 - 100.0 fL   MCH 30.0 26.0 - 34.0 pg   MCHC 32.9 30.0 - 36.0 g/dL   RDW 13.0 11.5 - 15.5 %   Platelets 244 150 - 400 K/uL   nRBC 0.0 0.0 - 0.2 %   Neutrophils Relative % 65 %   Neutro Abs 6.9 1.7 - 7.7 K/uL   Lymphocytes Relative 23 %   Lymphs Abs 2.4 0.7 - 4.0 K/uL   Monocytes Relative 8 %   Monocytes Absolute 0.9 0.1 - 1.0 K/uL   Eosinophils Relative 2 %   Eosinophils Absolute 0.2 0.0 - 0.5 K/uL   Basophils Relative 1 %   Basophils Absolute 0.1 0.0 - 0.1 K/uL   Immature Granulocytes 1 %   Abs Immature Granulocytes 0.06 0.00 - 0.07 K/uL    Comment: Performed at [facility], [address] [place],  [place] [address]    DG Hand Complete Left  Result Date: 05/01/2021 CLINICAL DATA:  Amputation of second and third digit with table saw. EXAM: LEFT HAND - COMPLETE 3+ VIEW COMPARISON:  None. FINDINGS: Osseous and soft tissue amputation of the second digit at the middle phalanx. Fracture through the third digit middle phalanx with dorsal displacement of distal fracture fragment. There may be intra-articular extension to the proximal interphalangeal joint. No overlying dressing is in place. No radiopaque foreign body. No additional fracture of the hand. 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 IMPRESSION: 1. Osseous and soft tissue amputation of the second digit at the middle phalanx. 2. Fracture through the third digit middle phalanx with dorsal displacement of distal fracture fragment and possible intra-articular extension to the proximal interphalangeal joint. Electronically Signed   By: Keith Rake M.D.   On: 05/01/2021 17:33     A comprehensive review of systems was negative. Review of Systems: No fevers, chills, night sweats, chest pain, shortness of breath, nausea, vomiting, diarrhea, constipation, easy bleeding or bruising, headaches, dizziness, vision changes, fainting.   Blood pressure 131/63, pulse 74, temperature 97.6 F (36.4 C), temperature source Oral, resp. rate 14, height 5\' 8"  (1.727 m), weight 99.8 kg, SpO2 99 %.  General appearance: alert, cooperative, and appears stated age Head: Normocephalic, without obvious abnormality, atraumatic Neck: supple, symmetrical, trachea midline Resp: clear to auscultation bilaterally Cardio: regular rate and rhythm Extremities: Right upper extremity: Intact sensation and capillary refill all digits.  +epl/fpl/io.  No wounds. Left upper extremity: Intact sensation and capillary refill in the thumb ring and small fingers.  There is amputation of the index finger through the middle phalanx.  The long finger has  laceration at the pad of the finger as well as a second laceration from the pad of the finger volarly in an oblique fashion down to the level of the PIP joint.  There is loss of tissue on the ulnar side.  There is no capillary refill in the pad of the finger.  No sensation in the pad of the finger.  He had been given a digital block at [facility].  He is unable to flex at the  PIP or DIP joints.  There is a small laceration in the ring finger at the distal aspect of the nail involving the nailbed. Pulses: 2+ and symmetric Skin: Skin color, texture, turgor normal. No rashes or lesions Neurologic: Grossly normal Incision/Wound: As above  Assessment/Plan Left hand tablesaw injury with amputation of index finger through middle phalanx and laceration of the long finger including likely tendon artery and nerve as well as middle phalanx fracture.  There is also a small laceration of the nailbed of the ring finger.  I discussed with Mr. Ardine Eng the nature of this injury.  I would advise revision amputation of the index finger.  We discussed that the long finger is from intents and purposes and amputation with the bone intact.  The important neurovascular structures on the palmar side of the finger have all been lacerated.  We discussed potential for attempts at revascularization and repair of digital nerves as well as tendons.  I think however that this if successful would result in a stiff finger likely with poor or no sensation that would also likely be stiff and cold intolerant.  It also may not even survive.  We also can amputate the finger preserving as much length as viable possible.  This would allow a quicker recovery and less likely future surgeries.  At this time he wishes to proceed with revision amputation of the index finger and amputation of the long finger and repair of ring finger laceration as necessary.  He understands this is a severely damaged to digit that has poor viability.  He states that he  realizes that this is a significantly damaged digit and that even if one or both of the vessels are intact and the nerves and tendons are lacerated that he will have poor function and sensation even with repair of those structures and wishes to have the finger amputated even if one or both of the vessels are intact due to the significance of the injury, anticipated potential functional recovery, and prolonged recovery and rehabilitation necessary for salvage.  I think this is a reasonable decision.  We will also clean up the wound of the ring finger and repair if necessary.  Risks, benefits and alternatives of surgery were discussed including risks of blood loss, infection, damage to nerves/vessels/tendons/ligament/bone, failure of surgery, need for additional surgery, complication with wound healing, stiffness.  He voiced understanding of these risks and elected to proceed.    Leanora Cover 05/01/2021, 9:05 PM

## 2021-05-01 NOTE — ED Notes (Signed)
Report received from [place].

## 2021-05-01 NOTE — Anesthesia Procedure Notes (Signed)
Procedure Name: Intubation Date/Time: 05/01/2021 9:14 PM Performed by: Benjermin Korber T, CRNA Pre-anesthesia Checklist: Patient identified, Emergency Drugs available, Suction available and Patient being monitored Patient Re-evaluated:Patient Re-evaluated prior to induction Oxygen Delivery Method: Circle system utilized Preoxygenation: Pre-oxygenation with 100% oxygen Induction Type: IV induction Ventilation: Mask ventilation without difficulty Laryngoscope Size: Mac and 4 Grade View: Grade I Tube type: Oral Tube size: 7.5 mm Number of attempts: 1 Airway Equipment and Method: Stylet and Oral airway Placement Confirmation: ETT inserted through vocal cords under direct vision, positive ETCO2 and breath sounds checked- equal and bilateral Secured at: 22 cm Tube secured with: Tape Dental Injury: Teeth and Oropharynx as per pre-operative assessment

## 2021-05-01 NOTE — Transfer of Care (Signed)
Immediate Anesthesia Transfer of Care Note  Patient: [name]  Procedure(s) Performed: LEFT INDEX AND LONG FINGER AMPUTATION AND RING FINGER NAIL BED REPAIR. (Left: Hand)  Patient Location: PACU  Anesthesia Type:General  Level of Consciousness: drowsy  Airway & Oxygen Therapy: Patient Spontanous Breathing and Patient connected to nasal cannula oxygen  Post-op Assessment: Report given to RN, Post -op Vital signs reviewed and stable and Patient moving all extremities  Post vital signs: Reviewed and stable  Last Vitals:  Vitals Value Taken Time  BP 140/78 05/01/21 2240  Temp    Pulse 75 05/01/21 2243  Resp 11 05/01/21 2243  SpO2 95 % 05/01/21 2243  Vitals shown include unvalidated device data.  Last Pain:  Vitals:   05/01/21 2044  TempSrc:   PainSc: 4          Complications: No notable events documented.

## 2021-05-02 ENCOUNTER — Encounter (HOSPITAL_COMMUNITY): Payer: Self-pay | Admitting: Orthopedic Surgery

## 2021-05-02 MED ORDER — DOXYCYCLINE MONOHYDRATE 50 MG PO CAPS
100.0000 mg | ORAL_CAPSULE | Freq: Two times a day (BID) | ORAL | 0 refills | Status: DC
Start: 2021-05-02 — End: 2021-09-16

## 2021-05-02 MED ORDER — OXYCODONE-ACETAMINOPHEN 5-325 MG PO TABS: ORAL_TABLET | ORAL | 0 refills | Status: DC

## 2021-05-02 NOTE — Anesthesia Postprocedure Evaluation (Signed)
Anesthesia Post Note  Patient: [name]  Procedure(s) Performed: LEFT INDEX AND LONG FINGER AMPUTATION AND RING FINGER NAIL BED REPAIR. (Left: Hand)     Patient location during evaluation: PACU Anesthesia Type: General Level of consciousness: awake and alert Pain management: pain level controlled Vital Signs Assessment: post-procedure vital signs reviewed and stable Respiratory status: spontaneous breathing, nonlabored ventilation, respiratory function stable and patient connected to nasal cannula oxygen Cardiovascular status: blood pressure returned to baseline and stable Postop Assessment: no apparent nausea or vomiting Anesthetic complications: no   No notable events documented.  Last Vitals:  Vitals:   05/01/21 2300 05/01/21 2315  BP: 136/68 125/69  Pulse: 75 73  Resp: 11 14  Temp:  36.6 C  SpO2: 98% 95%    Last Pain:  Vitals:   05/01/21 2315  TempSrc:   PainSc: 0-No pain                 Tiajuana Amass

## 2021-05-05 DIAGNOSIS — E782 Mixed hyperlipidemia: Secondary | ICD-10-CM | POA: Diagnosis not present

## 2021-05-07 DIAGNOSIS — S68111A Complete traumatic metacarpophalangeal amputation of left index finger, initial encounter: Secondary | ICD-10-CM | POA: Diagnosis not present

## 2021-05-07 DIAGNOSIS — S68113A Complete traumatic metacarpophalangeal amputation of left middle finger, initial encounter: Secondary | ICD-10-CM | POA: Diagnosis not present

## 2021-05-08 DIAGNOSIS — G4700 Insomnia, unspecified: Secondary | ICD-10-CM | POA: Diagnosis not present

## 2021-05-08 DIAGNOSIS — M25562 Pain in left knee: Secondary | ICD-10-CM | POA: Diagnosis not present

## 2021-05-08 DIAGNOSIS — E782 Mixed hyperlipidemia: Secondary | ICD-10-CM | POA: Diagnosis not present

## 2021-05-08 DIAGNOSIS — D638 Anemia in other chronic diseases classified elsewhere: Secondary | ICD-10-CM | POA: Diagnosis not present

## 2021-05-08 DIAGNOSIS — G4733 Obstructive sleep apnea (adult) (pediatric): Secondary | ICD-10-CM | POA: Diagnosis not present

## 2021-05-08 DIAGNOSIS — G2581 Restless legs syndrome: Secondary | ICD-10-CM | POA: Diagnosis not present

## 2021-05-08 DIAGNOSIS — F321 Major depressive disorder, single episode, moderate: Secondary | ICD-10-CM | POA: Diagnosis not present

## 2021-05-08 DIAGNOSIS — I129 Hypertensive chronic kidney disease with stage 1 through stage 4 chronic kidney disease, or unspecified chronic kidney disease: Secondary | ICD-10-CM | POA: Diagnosis not present

## 2021-05-08 DIAGNOSIS — S68119S 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, sequela: Secondary | ICD-10-CM | POA: Diagnosis not present

## 2021-05-08 DIAGNOSIS — N1831 Chronic kidney disease, stage 3a: Secondary | ICD-10-CM | POA: Diagnosis not present

## 2021-06-25 DIAGNOSIS — E7849 Other hyperlipidemia: Secondary | ICD-10-CM | POA: Diagnosis not present

## 2021-06-25 DIAGNOSIS — I129 Hypertensive chronic kidney disease with stage 1 through stage 4 chronic kidney disease, or unspecified chronic kidney disease: Secondary | ICD-10-CM | POA: Diagnosis not present

## 2021-06-27 DIAGNOSIS — M25561 Pain in right knee: Secondary | ICD-10-CM | POA: Diagnosis not present

## 2021-06-27 DIAGNOSIS — M25562 Pain in left knee: Secondary | ICD-10-CM | POA: Diagnosis not present

## 2021-07-01 ENCOUNTER — Other Ambulatory Visit: Payer: Self-pay | Admitting: Adult Health

## 2021-07-01 ENCOUNTER — Other Ambulatory Visit: Payer: Self-pay | Admitting: Orthopedic Surgery

## 2021-07-01 ENCOUNTER — Other Ambulatory Visit (HOSPITAL_COMMUNITY): Payer: Self-pay | Admitting: Orthopedic Surgery

## 2021-07-01 DIAGNOSIS — M25562 Pain in left knee: Secondary | ICD-10-CM

## 2021-07-09 ENCOUNTER — Ambulatory Visit (HOSPITAL_COMMUNITY): Payer: Medicare Other

## 2021-07-09 ENCOUNTER — Encounter (HOSPITAL_COMMUNITY): Payer: Self-pay

## 2021-07-10 ENCOUNTER — Other Ambulatory Visit: Payer: Self-pay

## 2021-07-10 ENCOUNTER — Ambulatory Visit (HOSPITAL_COMMUNITY)
Admission: RE | Admit: 2021-07-10 | Discharge: 2021-07-10 | Disposition: A | Discharge status: Home / Self Care | Payer: Medicare Other | Source: Ambulatory Visit | Attending: Orthopedic Surgery | Admitting: Orthopedic Surgery

## 2021-07-10 DIAGNOSIS — M25562 Pain in left knee: Secondary | ICD-10-CM | POA: Insufficient documentation

## 2021-07-21 DIAGNOSIS — M1712 Unilateral primary osteoarthritis, left knee: Secondary | ICD-10-CM | POA: Diagnosis not present

## 2021-07-21 DIAGNOSIS — M25562 Pain in left knee: Secondary | ICD-10-CM | POA: Diagnosis not present

## 2021-07-25 DIAGNOSIS — I129 Hypertensive chronic kidney disease with stage 1 through stage 4 chronic kidney disease, or unspecified chronic kidney disease: Secondary | ICD-10-CM | POA: Diagnosis not present

## 2021-07-25 DIAGNOSIS — E7849 Other hyperlipidemia: Secondary | ICD-10-CM | POA: Diagnosis not present

## 2021-08-25 DIAGNOSIS — Z20822 Contact with and (suspected) exposure to covid-19: Secondary | ICD-10-CM | POA: Diagnosis not present

## 2021-09-01 DIAGNOSIS — Z20822 Contact with and (suspected) exposure to covid-19: Secondary | ICD-10-CM | POA: Diagnosis not present

## 2021-09-04 NOTE — Progress Notes (Signed)
Your procedure is scheduled on:  11.22.22   Report to [facility]  Entrance   Report to admitting at  [place] AM     Call this number if you have problems the morning of surgery [phone]    REMEMBER: NO  SOLID FOOD CANDY OR GUM AFTER MIDNIGHT. CLEAR LIQUIDS UNTIL   0700am       . NOTHING BY MOUTH EXCEPT CLEAR LIQUIDS UNTIL   0700am  . PLEASE FINISH ENSURE DRINK PER SURGEON ORDER  WHICH NEEDS TO BE COMPLETED AT     0700am  .      CLEAR LIQUID DIET   Foods Allowed                                                                    Coffee and tea, regular and decaf                            Fruit ices (not with fruit pulp)                                      Iced Popsicles                                    Carbonated beverages, regular and diet                                    Cranberry, grape and apple juices Sports drinks like Gatorade Lightly seasoned clear broth or consume(fat free) Sugar, honey syrup ___________________________________________________________________      BRUSH YOUR TEETH MORNING OF SURGERY AND RINSE YOUR MOUTH OUT, NO CHEWING GUM CANDY OR MINTS.     Take these medicines the morning of surgery with A SIP OF WATER:  zyrtec, cymbalta, flonase, gabapentin   DO NOT TAKE ANY DIABETIC MEDICATIONS DAY OF YOUR SURGERY                               You may not have any metal on your body including hair pins and              piercings  Do not wear jewelry, make-up, lotions, powders or perfumes, deodorant             Do not wear nail polish on your fingernails.  Do not shave  48 hours prior to surgery.              Men may shave face and neck.   Do not bring valuables to the hospital. [place].  Contacts, dentures or bridgework may not be worn into surgery.  Leave suitcase in the car. After surgery it may be brought to your room.     Patients discharged the day of surgery will not be  allowed to drive  home. IF YOU ARE HAVING SURGERY AND GOING HOME THE SAME DAY, YOU MUST HAVE AN ADULT TO DRIVE YOU HOME AND BE WITH YOU FOR 24 HOURS. YOU MAY GO HOME BY TAXI OR UBER OR ORTHERWISE, BUT AN ADULT MUST ACCOMPANY YOU HOME AND STAY WITH YOU FOR 24 HOURS.  Name and phone number of your driver:  Special Instructions: N/A              Please read over the following fact sheets you were given: _____________________________________________________________________  [facility] - Preparing for Surgery Before surgery, you can play an important role.  Because skin is not sterile, your skin needs to be as free of germs as possible.  You can reduce the number of germs on your skin by washing with CHG (chlorahexidine gluconate) soap before surgery.  CHG is an antiseptic cleaner which kills germs and bonds with the skin to continue killing germs even after washing. Please DO NOT use if you have an allergy to CHG or antibacterial soaps.  If your skin becomes reddened/irritated stop using the CHG and inform your nurse when you arrive at Short Stay. Do not shave (including legs and underarms) for at least 48 hours prior to the first CHG shower.  You may shave your face/neck. Please follow these instructions carefully:  1.  Shower with CHG Soap the night before surgery and the  morning of Surgery.  2.  If you choose to wash your hair, wash your hair first as usual with your  normal  shampoo.  3.  After you shampoo, rinse your hair and body thoroughly to remove the  shampoo.                           4.  Use CHG as you would any other liquid soap.  You can apply chg directly  to the skin and wash                       Gently with a scrungie or clean washcloth.  5.  Apply the CHG Soap to your body ONLY FROM THE NECK DOWN.   Do not use on face/ open                           Wound or open sores. Avoid contact with eyes, ears mouth and genitals (private parts).                       Wash face,  Genitals  (private parts) with your normal soap.             6.  Wash thoroughly, paying special attention to the area where your surgery  will be performed.  7.  Thoroughly rinse your body with warm water from the neck down.  8.  DO NOT shower/wash with your normal soap after using and rinsing off  the CHG Soap.                9.  Pat yourself dry with a clean towel.            10.  Wear clean pajamas.            11.  Place clean sheets on your bed the night of your first shower and do not  sleep with pets. Day of Surgery : Do not apply any lotions/deodorants the morning of  surgery.  Please wear clean clothes to the hospital/surgery center.  FAILURE TO FOLLOW THESE INSTRUCTIONS MAY RESULT IN THE CANCELLATION OF YOUR SURGERY PATIENT SIGNATURE_________________________________  NURSE SIGNATURE__________________________________  ________________________________________________________________________

## 2021-09-04 NOTE — Progress Notes (Signed)
Anesthesia Review:  PCP: Cardiologist : Chest x-ray : EKG : 05/02/21  Echo :11/13/19  Stress test:01/09/20  Cardiac Cath :  Activity level:  Sleep Study/ CPAP : Fasting Blood Sugar :      / Checks Blood Sugar -- times a day:   Blood Thinner/ Instructions /Last Dose: ASA / Instructions/ Last Dose :

## 2021-09-05 ENCOUNTER — Encounter (HOSPITAL_COMMUNITY): Payer: Self-pay

## 2021-09-05 ENCOUNTER — Other Ambulatory Visit: Payer: Self-pay

## 2021-09-05 ENCOUNTER — Encounter (HOSPITAL_COMMUNITY)
Admission: RE | Admit: 2021-09-05 | Discharge: 2021-09-05 | Disposition: A | Discharge status: Home / Self Care | Payer: Medicare Other | Source: Ambulatory Visit | Attending: Orthopedic Surgery | Admitting: Orthopedic Surgery

## 2021-09-05 VITALS — BP 143/78 | HR 67 | Temp 98.5°F | Resp 16 | Ht 67.0 in | Wt 225.0 lb

## 2021-09-05 DIAGNOSIS — Z01812 Encounter for preprocedural laboratory examination: Secondary | ICD-10-CM | POA: Diagnosis not present

## 2021-09-05 DIAGNOSIS — M1711 Unilateral primary osteoarthritis, right knee: Secondary | ICD-10-CM | POA: Diagnosis not present

## 2021-09-05 DIAGNOSIS — M1712 Unilateral primary osteoarthritis, left knee: Secondary | ICD-10-CM | POA: Diagnosis not present

## 2021-09-05 DIAGNOSIS — M25562 Pain in left knee: Secondary | ICD-10-CM | POA: Diagnosis not present

## 2021-09-05 DIAGNOSIS — Z01818 Encounter for other preprocedural examination: Secondary | ICD-10-CM

## 2021-09-05 LAB — CBC
HCT: 40.9 % (ref 39.0–52.0)
Hemoglobin: 13.7 g/dL (ref 13.0–17.0)
MCH: 30.2 pg (ref 26.0–34.0)
MCHC: 33.5 g/dL (ref 30.0–36.0)
MCV: 90.1 fL (ref 80.0–100.0)
Platelets: 215 10*3/uL (ref 150–400)
RBC: 4.54 MIL/uL (ref 4.22–5.81)
RDW: 12.9 % (ref 11.5–15.5)
WBC: 6.9 10*3/uL (ref 4.0–10.5)
nRBC: 0 % (ref 0.0–0.2)

## 2021-09-05 LAB — COMPREHENSIVE METABOLIC PANEL
ALT: 15 U/L (ref 0–44)
AST: 25 U/L (ref 15–41)
Albumin: 4.1 g/dL (ref 3.5–5.0)
Alkaline Phosphatase: 63 U/L (ref 38–126)
Anion gap: 7 (ref 5–15)
BUN: 22 mg/dL (ref 8–23)
CO2: 28 mmol/L (ref 22–32)
Calcium: 9.3 mg/dL (ref 8.9–10.3)
Chloride: 104 mmol/L (ref 98–111)
Creatinine, Ser: 1.5 mg/dL — ABNORMAL HIGH (ref 0.61–1.24)
GFR, Estimated: 49 mL/min — ABNORMAL LOW (ref >60)
Glucose, Bld: 85 mg/dL (ref 70–99)
Potassium: 4.2 mmol/L (ref 3.5–5.1)
Sodium: 139 mmol/L (ref 135–145)
Total Bilirubin: 0.6 mg/dL (ref 0.3–1.2)
Total Protein: 6.8 g/dL (ref 6.5–8.1)

## 2021-09-05 LAB — SURGICAL PCR SCREEN
MRSA, PCR: NEGATIVE
Staphylococcus aureus: NEGATIVE

## 2021-09-05 LAB — TYPE AND SCREEN
ABO/RH(D): A POS
Antibody Screen: NEGATIVE

## 2021-09-05 NOTE — Progress Notes (Addendum)
Anesthesia Review:  IDU:[facility] [place] Crane, MD Cardiologist :Cassell Clement [birth year] Glenetta Hew , MD Chest x-ray : EKG : 05/02/21  Echo :11/13/19  Stress test:01/09/20  Cardiac Cath :  Activity level: Able to walk without SOB/CP Sleep Study/ CPAP : Fasting Blood Sugar :      / Checks Blood Sugar -- times a day:   Blood Thinner/ Instructions /Last Dose: ASA / Instructions/ Last Dose :   [employer] x 3 doses

## 2021-09-09 DIAGNOSIS — I129 Hypertensive chronic kidney disease with stage 1 through stage 4 chronic kidney disease, or unspecified chronic kidney disease: Secondary | ICD-10-CM | POA: Insufficient documentation

## 2021-09-09 DIAGNOSIS — N200 Calculus of kidney: Secondary | ICD-10-CM | POA: Diagnosis not present

## 2021-09-09 DIAGNOSIS — N1831 Chronic kidney disease, stage 3a: Secondary | ICD-10-CM | POA: Diagnosis not present

## 2021-09-09 DIAGNOSIS — G4733 Obstructive sleep apnea (adult) (pediatric): Secondary | ICD-10-CM | POA: Diagnosis not present

## 2021-09-09 DIAGNOSIS — E6609 Other obesity due to excess calories: Secondary | ICD-10-CM | POA: Diagnosis not present

## 2021-09-09 DIAGNOSIS — F321 Major depressive disorder, single episode, moderate: Secondary | ICD-10-CM | POA: Diagnosis not present

## 2021-09-09 DIAGNOSIS — M25562 Pain in left knee: Secondary | ICD-10-CM | POA: Diagnosis not present

## 2021-09-09 DIAGNOSIS — D638 Anemia in other chronic diseases classified elsewhere: Secondary | ICD-10-CM | POA: Diagnosis not present

## 2021-09-12 DIAGNOSIS — N1832 Chronic kidney disease, stage 3b: Secondary | ICD-10-CM | POA: Diagnosis not present

## 2021-09-12 DIAGNOSIS — N200 Calculus of kidney: Secondary | ICD-10-CM | POA: Diagnosis not present

## 2021-09-12 DIAGNOSIS — I129 Hypertensive chronic kidney disease with stage 1 through stage 4 chronic kidney disease, or unspecified chronic kidney disease: Secondary | ICD-10-CM | POA: Diagnosis not present

## 2021-09-12 DIAGNOSIS — E6609 Other obesity due to excess calories: Secondary | ICD-10-CM | POA: Diagnosis not present

## 2021-09-12 NOTE — Progress Notes (Signed)
CBC is a medically necessary test prior to undergoing joint replacement surgery. It is pertinent to know pre-operative hemoglobin for surgical risk assessment and planning.

## 2021-09-15 NOTE — Anesthesia Preprocedure Evaluation (Addendum)
Anesthesia Evaluation  Patient identified by MRN, date of birth, ID band Patient awake    Reviewed: Allergy & Precautions, NPO status , Patient's Chart, lab work & pertinent test results  Airway Mallampati: II  TM Distance: >3 FB Neck ROM: Full    Dental no notable dental hx.    Pulmonary sleep apnea ,    Pulmonary exam normal        Cardiovascular hypertension, Pt. on medications Normal cardiovascular exam     Neuro/Psych negative neurological ROS  negative psych ROS   GI/Hepatic negative GI ROS, Neg liver ROS,   Endo/Other  negative endocrine ROS  Renal/GU CRFRenal diseaseS/p right partial nephrectomy  negative genitourinary   Musculoskeletal  (+) Arthritis ,   Abdominal (+) + obese,   Peds  Hematology negative hematology ROS (+)   Anesthesia Other Findings   Reproductive/Obstetrics                            Anesthesia Physical  Anesthesia Plan  ASA: 2  Anesthesia Plan: Spinal   Post-op Pain Management: Regional block   Induction:   PONV Risk Score and Plan: 2 and Dexamethasone, Ondansetron and Treatment may vary due to age or medical condition  Airway Management Planned:   Additional Equipment: None  Intra-op Plan:   Post-operative Plan:   Informed Consent: I have reviewed the patients History and Physical, chart, labs and discussed the procedure including the risks, benefits and alternatives for the proposed anesthesia with the patient or authorized representative who has indicated his/her understanding and acceptance.     Dental advisory given  Plan Discussed with: CRNA  Anesthesia Plan Comments:        Anesthesia Quick Evaluation

## 2021-09-15 NOTE — H&P (Signed)
PARTIAL KNEE ADMISSION H&P  Patient is being admitted for left partial knee arthroplasty.  Subjective:  Chief Complaint:left knee pain.  HPI: [name], 70 y.o. male, has a history of pain and functional disability in the left knee due to arthritis and has failed non-surgical conservative treatments for greater than 12 weeks to includeNSAID's and/or analgesics and activity modification.  Onset of symptoms was gradual, starting 2 years ago with gradually worsening course since that time. The patient noted no past surgery on the left knee(s).  Patient currently rates pain in the left knee(s) at 7 out of 10 with activity. Patient has worsening of pain with activity and weight bearing and pain that interferes with activities of daily living.  Patient has evidence of joint space narrowing by imaging studies.  There is no active infection.  Patient Active Problem List   Diagnosis Date Noted   Chest pain, non-cardiac 10/11/2019   DOE (dyspnea on exertion) 10/11/2019   Abnormal heart sounds 10/11/2019   Pain in left knee 09/20/2019   Iron deficiency anemia 09/07/2019   Pain in thumb joint with movement of left hand 16/38/4665   Metabolic bone disease 99/35/7017   Renal stone 04/25/2019   Pain of left hand 03/23/2019   Insomnia 09/14/2018   S/P right knee arthroscopy 04/14/18    Absolute anemia 04/05/2017   Restless leg syndrome 12/07/2016   Numbness 12/07/2016   OSA on CPAP 12/07/2016   Spinal stenosis of cervical region 12/07/2016   Spinal stenosis of lumbar region 06/18/2014   CTS (carpal tunnel syndrome) 12/20/2012   Medial meniscus, posterior horn derangement 12/20/2012   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   Stage 3 chronic kidney disease ([place]) 11/22/2011   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   Sleep apnea    uses Cpap    Past Surgical History:  Procedure Laterality Date   BACK SURGERY     lumbar 2020   CATARACT EXTRACTION     10-15 years ago   COLONOSCOPY  05/25/2012   Procedure: COLONOSCOPY;  Surgeon: Rogene Houston, MD;  [facility]: [facility];  Service: Endoscopy;  Laterality: N/A;  1030   COLONOSCOPY N/A 05/14/2017   Procedure: COLONOSCOPY;  Surgeon: Rogene Houston, MD;  Location: [facility];  Service: Endoscopy;  Laterality: N/A;  10:10   CYSTOSCOPY/RETROGRADE/URETEROSCOPY Right 06/07/2014   Procedure: CYSTOSCOPY, RIGHT RETROGRADE, RIGHT URETEROSCOPY, RIGHT URETERAL BALLOON DILATATION, BIOPSY, RIGHT URETERAL STENT;  Surgeon: Jorja Loa, MD;  [facility];  Service: Urology;  Laterality: Right;   I & D EXTREMITY Left 05/01/2021   Procedure: LEFT INDEX AND LONG FINGER AMPUTATION AND RING FINGER NAIL BED REPAIR.;  Surgeon: Leanora Cover, MD;  Location: [facility];  Service: Orthopedics;  Laterality: Left;  LEFT INDEX AND LONG FINGER AMPUTATION AND RING FINGER NAIL BED REPAIR.   kidney stone  1983   Had partial right nephrectomy   KNEE ARTHROSCOPY WITH MEDIAL MENISECTOMY Right 04/14/2018   Procedure: KNEE ARTHROSCOPY WITH MEDIAL MENISECTOMY;  Surgeon: Carole Civil, MD;  Location: [facility];  Service:  Orthopedics;  Laterality: Right;   left foot surgery  2010   repair of joint   WRIST SURGERY Bilateral     No current facility-administered medications for this encounter.   Current Outpatient Medications  Medication Sig Dispense Refill Last Dose   cetirizine (ZYRTEC) 10 MG tablet Take 10 mg by mouth daily.      Cholecalciferol (VITAMIN D3 PO) Take 1 tablet by mouth daily.      DULoxetine (CYMBALTA) 30 MG capsule Take 30 mg by mouth 2 (two) times  daily.      fluticasone (FLONASE) 50 MCG/ACT nasal spray Place 2 sprays into both nostrils daily.      gabapentin (NEURONTIN) 300 MG capsule Take 1 capsule (300 mg total) by mouth 3 (three) times daily. Please call [phone] and schedule appointment. Must be seen for further refills. 270 capsule 0    hydrochlorothiazide (MICROZIDE) 12.5 MG capsule Take 12.5 mg by mouth every morning.       pramipexole (MIRAPEX) 0.5 MG tablet TAKE 1 TABLET AT DINNER AND 1 TABLET AT BEDTIME. (Patient taking differently: Take 0.5 mg by mouth every evening. TAKE 1 TABLET AT DINNER AND 1 TABLET AT BEDTIME.) 180 tablet 1    QUEtiapine (SEROQUEL) 50 MG tablet Take 50 mg by mouth at bedtime.      doxycycline (MONODOX) 50 MG capsule Take 2 capsules (100 mg total) by mouth 2 (two) times daily. (Patient not taking: No sig reported) 28 capsule 0 Not Taking   oxyCODONE-acetaminophen (PERCOCET) 5-325 MG tablet 1-2 tabs po q6 hours prn pain (Patient not taking: Reported on 09/02/2021) 25 tablet 0 Not Taking   No Known Allergies  Social History   Tobacco Use   Smoking status: Never   Smokeless tobacco: Never  Substance Use Topics   Alcohol use: No    Comment: rare    Family History  Problem Relation Age of Onset   Cancer Father        Bladder cancer   Lung cancer Brother    Healthy Mother    Healthy Brother    Healthy Brother      Review of Systems  Constitutional:  Negative for chills and fever.  Respiratory:  Negative for cough and shortness of breath.   Cardiovascular:  Negative for chest pain.  Gastrointestinal:  Negative for nausea and vomiting.  Musculoskeletal:  Positive for arthralgias.    Objective:  Physical Exam Well nourished and well developed. General: Alert and oriented x3, cooperative and pleasant, no acute distress. Head: normocephalic, atraumatic, neck supple. Eyes: EOMI.  Musculoskeletal: Left knee exam: Mild to moderate effusion noted with tenderness medially predominantly. Full  knee extension and flexion over 120 degrees with some tightness without significant crepitation Stable medial lateral collateral ligaments No significant calf tenderness, erythema or edema  Right Knee Exam: Minimal joint line tenderness. No effusion. No erythema or warmth. AROM 0-120.  Calves soft and nontender. Motor function intact in LE. Strength 5/5 LE bilaterally. Neuro: Distal pulses 2+. Sensation to light touch intact in LE.  Vital signs in last 24 hours:    Labs:   Estimated body mass index is 35.24 kg/m as calculated from the following:   Height as of 09/05/21: 5\' 7"  (1.702 m).   Weight as of 09/05/21: 102.1 kg.   Imaging Review Plain radiographs demonstrate severe medial based degenerative joint disease of the left knee(s). The overall alignment isneutral. The bone quality appears to be adequate for age and reported activity level.  Assessment/Plan:  End stage arthritis, left knee   The patient history, physical examination, clinical judgment of the provider and imaging studies are consistent with end stage degenerative joint disease of the left knee(s) and partial knee arthroplasty is deemed medically necessary. The treatment options including medical management, injection therapy arthroscopy and arthroplasty were discussed at length. The risks and benefits of total knee arthroplasty were presented and reviewed. The risks due to aseptic loosening, infection, stiffness, patella tracking problems, thromboembolic complications and other imponderables were discussed. The patient acknowledged the explanation, agreed to proceed with the plan and consent was signed. Patient is being admitted for inpatient treatment for surgery, pain control, PT, OT, prophylactic antibiotics, VTE prophylaxis, progressive ambulation and ADL's and discharge planning. The patient is planning to be discharged  home.  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's, No NSAIDs - Oxycodone, Tylenol, robaxin - Right knee injected with cortisone at H&P   Patient's anticipated LOS is less than 2 midnights, meeting these requirements: - Younger than 6 - Lives within 1 hour of care - Has a competent adult at home to recover with post-op recover - NO history of  - Chronic pain requiring opiods  - Diabetes  - Coronary Artery Disease  - Heart failure  - Heart attack  - Stroke  - DVT/VTE  - Cardiac arrhythmia  - Respiratory Failure/COPD  - Renal failure  - Anemia  - Advanced Liver disease  Costella Hatcher, PA-C Orthopedic Surgery [facility] [place] [phone]

## 2021-09-15 NOTE — Progress Notes (Signed)
Called patient about time change for surgery on 09/16/21. He is to arrive 0515 for 0715 surgery. To complete ERAS drink by 0415. Patient verbalizes understanding.

## 2021-09-16 ENCOUNTER — Ambulatory Visit (HOSPITAL_COMMUNITY): Payer: Medicare Other | Admitting: Anesthesiology

## 2021-09-16 ENCOUNTER — Encounter (HOSPITAL_COMMUNITY): Payer: Self-pay | Admitting: Orthopedic Surgery

## 2021-09-16 ENCOUNTER — Ambulatory Visit (HOSPITAL_COMMUNITY)
Admission: RE | Admit: 2021-09-16 | Discharge: 2021-09-16 | Disposition: A | Discharge status: Home / Self Care | Payer: Medicare Other | Source: Ambulatory Visit | Attending: Orthopedic Surgery | Admitting: Orthopedic Surgery

## 2021-09-16 ENCOUNTER — Encounter (HOSPITAL_COMMUNITY)
Admission: RE | Disposition: A | Discharge status: Home / Self Care | Payer: Self-pay | Source: Ambulatory Visit | Attending: Orthopedic Surgery

## 2021-09-16 DIAGNOSIS — N183 Chronic kidney disease, stage 3 unspecified: Secondary | ICD-10-CM | POA: Diagnosis not present

## 2021-09-16 DIAGNOSIS — M179 Osteoarthritis of knee, unspecified: Secondary | ICD-10-CM | POA: Insufficient documentation

## 2021-09-16 DIAGNOSIS — G4730 Sleep apnea, unspecified: Secondary | ICD-10-CM | POA: Insufficient documentation

## 2021-09-16 DIAGNOSIS — Z905 Acquired absence of kidney: Secondary | ICD-10-CM | POA: Diagnosis not present

## 2021-09-16 DIAGNOSIS — Z96652 Presence of left artificial knee joint: Secondary | ICD-10-CM

## 2021-09-16 DIAGNOSIS — N189 Chronic kidney disease, unspecified: Secondary | ICD-10-CM | POA: Diagnosis not present

## 2021-09-16 DIAGNOSIS — M6281 Muscle weakness (generalized): Secondary | ICD-10-CM | POA: Insufficient documentation

## 2021-09-16 DIAGNOSIS — M1712 Unilateral primary osteoarthritis, left knee: Secondary | ICD-10-CM | POA: Diagnosis not present

## 2021-09-16 DIAGNOSIS — G8918 Other acute postprocedural pain: Secondary | ICD-10-CM | POA: Diagnosis not present

## 2021-09-16 DIAGNOSIS — I129 Hypertensive chronic kidney disease with stage 1 through stage 4 chronic kidney disease, or unspecified chronic kidney disease: Secondary | ICD-10-CM | POA: Diagnosis not present

## 2021-09-16 HISTORY — PX: PARTIAL KNEE ARTHROPLASTY: SHX2174

## 2021-09-16 LAB — ABO/RH: ABO/RH(D): A POS

## 2021-09-16 SURGERY — ARTHROPLASTY, KNEE, UNICOMPARTMENTAL
Anesthesia: General | Site: Knee | Laterality: Left

## 2021-09-16 MED ORDER — 0.9 % SODIUM CHLORIDE (POUR BTL) OPTIME
TOPICAL | Status: DC | PRN
  Administered 2021-09-16: 1000 mL

## 2021-09-16 MED ORDER — KETOROLAC TROMETHAMINE 30 MG/ML IJ SOLN
INTRAMUSCULAR | Status: DC | PRN
  Administered 2021-09-16: 30 mg

## 2021-09-16 MED ORDER — FENTANYL CITRATE PF 50 MCG/ML IJ SOSY: 25.0000 ug | PREFILLED_SYRINGE | INTRAMUSCULAR | Status: DC | PRN

## 2021-09-16 MED ORDER — ROPIVACAINE HCL 5 MG/ML IJ SOLN
INTRAMUSCULAR | Status: DC | PRN
  Administered 2021-09-16 (×6): 5 mL via PERINEURAL

## 2021-09-16 MED ORDER — DOCUSATE SODIUM 100 MG PO CAPS: 100.0000 mg | ORAL_CAPSULE | Freq: Two times a day (BID) | ORAL | Status: DC

## 2021-09-16 MED ORDER — LIDOCAINE HCL (PF) 2 % IJ SOLN
INTRAMUSCULAR | Status: AC
  Filled 2021-09-16: qty 5

## 2021-09-16 MED ORDER — DEXAMETHASONE SODIUM PHOSPHATE 10 MG/ML IJ SOLN
INTRAMUSCULAR | Status: AC
  Filled 2021-09-16: qty 2

## 2021-09-16 MED ORDER — ACETAMINOPHEN 160 MG/5ML PO SOLN: 325.0000 mg | ORAL | Status: DC | PRN

## 2021-09-16 MED ORDER — ASPIRIN 81 MG PO CHEW: 81.0000 mg | CHEWABLE_TABLET | Freq: Two times a day (BID) | ORAL | 0 refills | Status: AC

## 2021-09-16 MED ORDER — DEXAMETHASONE SODIUM PHOSPHATE 10 MG/ML IJ SOLN
INTRAMUSCULAR | Status: DC | PRN
  Administered 2021-09-16: 8 mg via INTRAVENOUS

## 2021-09-16 MED ORDER — CLONIDINE HCL (ANALGESIA) 100 MCG/ML EP SOLN
EPIDURAL | Status: DC | PRN
  Administered 2021-09-16: 100 ug

## 2021-09-16 MED ORDER — LACTATED RINGERS IV BOLUS
500.0000 mL | Freq: Once | INTRAVENOUS | Status: AC
  Administered 2021-09-16: 500 mL via INTRAVENOUS

## 2021-09-16 MED ORDER — OXYCODONE HCL 5 MG PO TABS: 5.0000 mg | ORAL_TABLET | Freq: Four times a day (QID) | ORAL | 0 refills | Status: DC | PRN

## 2021-09-16 MED ORDER — PROPOFOL 10 MG/ML IV BOLUS
INTRAVENOUS | Status: DC | PRN
  Administered 2021-09-16: 55 ug/kg/min via INTRAVENOUS

## 2021-09-16 MED ORDER — ORAL CARE MOUTH RINSE: 15.0000 mL | Freq: Once | OROMUCOSAL | Status: AC

## 2021-09-16 MED ORDER — METHOCARBAMOL 500 MG PO TABS: 500.0000 mg | ORAL_TABLET | Freq: Four times a day (QID) | ORAL | Status: DC | PRN

## 2021-09-16 MED ORDER — PHENYLEPHRINE 40 MCG/ML (10ML) SYRINGE FOR IV PUSH (FOR BLOOD PRESSURE SUPPORT)
PREFILLED_SYRINGE | INTRAVENOUS | Status: AC
  Filled 2021-09-16: qty 10

## 2021-09-16 MED ORDER — TRANEXAMIC ACID-NACL 1000-0.7 MG/100ML-% IV SOLN
1000.0000 mg | INTRAVENOUS | Status: AC
  Administered 2021-09-16: 1000 mg via INTRAVENOUS
  Filled 2021-09-16: qty 100

## 2021-09-16 MED ORDER — FENTANYL CITRATE (PF) 100 MCG/2ML IJ SOLN
INTRAMUSCULAR | Status: AC
  Filled 2021-09-16: qty 2

## 2021-09-16 MED ORDER — ONDANSETRON HCL 4 MG/2ML IJ SOLN
INTRAMUSCULAR | Status: AC
  Filled 2021-09-16: qty 2

## 2021-09-16 MED ORDER — POLYETHYLENE GLYCOL 3350 17 G PO PACK: 17.0000 g | PACK | Freq: Two times a day (BID) | ORAL | 0 refills | Status: DC

## 2021-09-16 MED ORDER — PROPOFOL 1000 MG/100ML IV EMUL
INTRAVENOUS | Status: AC
  Filled 2021-09-16: qty 200

## 2021-09-16 MED ORDER — TRANEXAMIC ACID-NACL 1000-0.7 MG/100ML-% IV SOLN: 1000.0000 mg | Freq: Once | INTRAVENOUS | Status: DC

## 2021-09-16 MED ORDER — METHOCARBAMOL 500 MG IVPB - SIMPLE MED: 500.0000 mg | Freq: Four times a day (QID) | INTRAVENOUS | Status: DC | PRN

## 2021-09-16 MED ORDER — MEPIVACAINE HCL (PF) 2 % IJ SOLN
INTRAMUSCULAR | Status: AC
  Filled 2021-09-16: qty 20

## 2021-09-16 MED ORDER — POVIDONE-IODINE 10 % EX SWAB
2.0000 "application " | Freq: Once | CUTANEOUS | Status: AC
  Administered 2021-09-16: 2 via TOPICAL

## 2021-09-16 MED ORDER — MIDAZOLAM HCL 2 MG/2ML IJ SOLN
INTRAMUSCULAR | Status: AC
  Filled 2021-09-16: qty 2

## 2021-09-16 MED ORDER — PROPOFOL 10 MG/ML IV BOLUS
INTRAVENOUS | Status: AC
  Filled 2021-09-16: qty 40

## 2021-09-16 MED ORDER — OXYCODONE HCL 5 MG PO TABS: 5.0000 mg | ORAL_TABLET | Freq: Once | ORAL | Status: DC | PRN

## 2021-09-16 MED ORDER — CHLORHEXIDINE GLUCONATE 0.12 % MT SOLN
15.0000 mL | Freq: Once | OROMUCOSAL | Status: AC
  Administered 2021-09-16: 15 mL via OROMUCOSAL

## 2021-09-16 MED ORDER — FENTANYL CITRATE (PF) 100 MCG/2ML IJ SOLN
INTRAMUSCULAR | Status: DC | PRN
  Administered 2021-09-16: 100 ug via INTRAVENOUS

## 2021-09-16 MED ORDER — LACTATED RINGERS IV SOLN: INTRAVENOUS | Status: DC

## 2021-09-16 MED ORDER — CEFAZOLIN SODIUM-DEXTROSE 2-4 GM/100ML-% IV SOLN: 2.0000 g | Freq: Four times a day (QID) | INTRAVENOUS | Status: DC

## 2021-09-16 MED ORDER — MEPIVACAINE HCL (PF) 2 % IJ SOLN
INTRAMUSCULAR | Status: DC | PRN
  Administered 2021-09-16: 3 mL via INTRATHECAL

## 2021-09-16 MED ORDER — STERILE WATER FOR IRRIGATION IR SOLN
Status: DC | PRN
  Administered 2021-09-16: 2000 mL

## 2021-09-16 MED ORDER — BUPIVACAINE-EPINEPHRINE 0.25% -1:200000 IJ SOLN
INTRAMUSCULAR | Status: DC | PRN
  Administered 2021-09-16: 30 mL

## 2021-09-16 MED ORDER — ONDANSETRON HCL 4 MG/2ML IJ SOLN
INTRAMUSCULAR | Status: DC | PRN
  Administered 2021-09-16: 4 mg via INTRAVENOUS

## 2021-09-16 MED ORDER — DEXAMETHASONE SODIUM PHOSPHATE 10 MG/ML IJ SOLN: 8.0000 mg | Freq: Once | INTRAMUSCULAR | Status: DC

## 2021-09-16 MED ORDER — ACETAMINOPHEN 325 MG PO TABS: 325.0000 mg | ORAL_TABLET | ORAL | Status: DC | PRN

## 2021-09-16 MED ORDER — DEXMEDETOMIDINE (PRECEDEX) IN NS 20 MCG/5ML (4 MCG/ML) IV SYRINGE
PREFILLED_SYRINGE | INTRAVENOUS | Status: AC
  Filled 2021-09-16: qty 5

## 2021-09-16 MED ORDER — METHOCARBAMOL 500 MG PO TABS: 500.0000 mg | ORAL_TABLET | Freq: Four times a day (QID) | ORAL | 0 refills | Status: DC | PRN

## 2021-09-16 MED ORDER — CEFAZOLIN SODIUM-DEXTROSE 2-4 GM/100ML-% IV SOLN
2.0000 g | INTRAVENOUS | Status: AC
  Administered 2021-09-16: 2 g via INTRAVENOUS
  Filled 2021-09-16: qty 100

## 2021-09-16 MED ORDER — DEXAMETHASONE SODIUM PHOSPHATE 10 MG/ML IJ SOLN
INTRAMUSCULAR | Status: DC | PRN
  Administered 2021-09-16: 10 mg

## 2021-09-16 MED ORDER — ONDANSETRON HCL 4 MG/2ML IJ SOLN
INTRAMUSCULAR | Status: AC
  Filled 2021-09-16: qty 4

## 2021-09-16 MED ORDER — PHENYLEPHRINE 40 MCG/ML (10ML) SYRINGE FOR IV PUSH (FOR BLOOD PRESSURE SUPPORT)
PREFILLED_SYRINGE | INTRAVENOUS | Status: DC | PRN
  Administered 2021-09-16: 80 ug via INTRAVENOUS

## 2021-09-16 MED ORDER — LACTATED RINGERS IV BOLUS
250.0000 mL | Freq: Once | INTRAVENOUS | Status: AC
  Administered 2021-09-16: 250 mL via INTRAVENOUS

## 2021-09-16 MED ORDER — DEXAMETHASONE SODIUM PHOSPHATE 10 MG/ML IJ SOLN
INTRAMUSCULAR | Status: AC
  Filled 2021-09-16: qty 1

## 2021-09-16 MED ORDER — MIDAZOLAM HCL 5 MG/5ML IJ SOLN
INTRAMUSCULAR | Status: DC | PRN
  Administered 2021-09-16: 2 mg via INTRAVENOUS

## 2021-09-16 MED ORDER — OXYCODONE HCL 5 MG/5ML PO SOLN: 5.0000 mg | Freq: Once | ORAL | Status: DC | PRN

## 2021-09-16 MED ORDER — SODIUM CHLORIDE (PF) 0.9 % IJ SOLN
INTRAMUSCULAR | Status: DC | PRN
  Administered 2021-09-16: 30 mL

## 2021-09-16 MED ORDER — ROCURONIUM BROMIDE 10 MG/ML (PF) SYRINGE
PREFILLED_SYRINGE | INTRAVENOUS | Status: AC
  Filled 2021-09-16: qty 10

## 2021-09-16 MED ORDER — ONDANSETRON HCL 4 MG/2ML IJ SOLN: 4.0000 mg | Freq: Once | INTRAMUSCULAR | Status: DC | PRN

## 2021-09-16 SURGICAL SUPPLY — 47 items
ADH SKN CLS APL DERMABOND .7 (GAUZE/BANDAGES/DRESSINGS) ×1
BAG COUNTER SPONGE SURGICOUNT (BAG) IMPLANT
BAG SPEC THK2 15X12 ZIP CLS (MISCELLANEOUS)
BAG SPNG CNTER NS LX DISP (BAG)
BAG ZIPLOCK 12X15 (MISCELLANEOUS) IMPLANT
BLADE SAW RECIPROCATING 77.5 (BLADE) ×2 IMPLANT
BLADE SAW SGTL 13.0X1.19X90.0M (BLADE) ×2 IMPLANT
BNDG ELASTIC 6X5.8 VLCR STR LF (GAUZE/BANDAGES/DRESSINGS) ×2 IMPLANT
BOWL SMART MIX CTS (DISPOSABLE) ×2 IMPLANT
BRNG TIB G 9 STRL KN LT MDL (Insert) ×1 IMPLANT
BSPLAT TIB G STRL KN LT MED TI (Orthopedic Implant) ×1 IMPLANT
CEMENT BONE R 1X40 (Cement) ×2 IMPLANT
COVER SURGICAL LIGHT HANDLE (MISCELLANEOUS) ×2 IMPLANT
CUFF TOURN SGL QUICK 34 (TOURNIQUET CUFF) ×2
CUFF TRNQT CYL 34X4.125X (TOURNIQUET CUFF) ×1 IMPLANT
DECANTER SPIKE VIAL GLASS SM (MISCELLANEOUS) ×4 IMPLANT
DERMABOND ADVANCED (GAUZE/BANDAGES/DRESSINGS) ×1
DERMABOND ADVANCED .7 DNX12 (GAUZE/BANDAGES/DRESSINGS) ×1 IMPLANT
DRAPE INCISE IOBAN 66X45 STRL (DRAPES) ×6 IMPLANT
DRAPE U-SHAPE 47X51 STRL (DRAPES) ×2 IMPLANT
DRESSING AQUACEL AG SP 3.5X10 (GAUZE/BANDAGES/DRESSINGS) ×1 IMPLANT
DRSG AQUACEL AG SP 3.5X10 (GAUZE/BANDAGES/DRESSINGS) ×2
DURAPREP 26ML APPLICATOR (WOUND CARE) ×2 IMPLANT
ELECT REM PT RETURN 15FT ADLT (MISCELLANEOUS) ×2 IMPLANT
GLOVE SURG UNDER POLY LF SZ7.5 (GLOVE) ×6 IMPLANT
GOWN STRL REUS W/TWL LRG LVL3 (GOWN DISPOSABLE) ×2 IMPLANT
HOLDER FOLEY CATH W/STRAP (MISCELLANEOUS) IMPLANT
INSERT TIB BEAR ASF G 9 LT (Insert) ×1 IMPLANT
INSERTER TIP PARTIAL KNEE (MISCELLANEOUS) ×1 IMPLANT
KIT TURNOVER KIT A (KITS) IMPLANT
LEGGING LITHOTOMY PAIR STRL (DRAPES) ×2 IMPLANT
MANIFOLD NEPTUNE II (INSTRUMENTS) ×2 IMPLANT
NDL SAFETY ECLIPSE 18X1.5 (NEEDLE) ×1 IMPLANT
NEEDLE HYPO 18GX1.5 SHARP (NEEDLE) ×2
PACK TOTAL KNEE CUSTOM (KITS) ×2 IMPLANT
PSN PK CMT TIB LM SZ G KNEE (Orthopedic Implant) ×2 IMPLANT
SET PAD KNEE POSITIONER (MISCELLANEOUS) ×2 IMPLANT
SURFACE ARTC PRSNA TIB SZ G KN (Orthopedic Implant) IMPLANT
SUT MNCRL AB 4-0 PS2 18 (SUTURE) ×2 IMPLANT
SUT STRATAFIX PDS+ 0 24IN (SUTURE) ×2 IMPLANT
SUT VIC AB 1 CT1 36 (SUTURE) ×2 IMPLANT
SUT VIC AB 2-0 CT1 27 (SUTURE) ×4
SUT VIC AB 2-0 CT1 TAPERPNT 27 (SUTURE) ×2 IMPLANT
SYR 3ML LL SCALE MARK (SYRINGE) ×2 IMPLANT
SYSTEM KNEE FEMUR SZ 3 LT (Orthopedic Implant) ×1 IMPLANT
TRAY FOLEY MTR SLVR 16FR STAT (SET/KITS/TRAYS/PACK) ×2 IMPLANT
WRAP KNEE MAXI GEL POST OP (GAUZE/BANDAGES/DRESSINGS) ×2 IMPLANT

## 2021-09-16 NOTE — Brief Op Note (Signed)
09/16/2021  8:49 AM  PATIENT:  [name]  70 y.o. male  PRE-OPERATIVE DIAGNOSIS:  Left knee medial compartmental osteoarthritis  POST-OPERATIVE DIAGNOSIS:  Left knee medial compartmental osteoarthritis  PROCEDURE:  Procedure(s): UNICOMPARTMENTAL KNEE MEDIALLY (Left)  SURGEON:  Surgeon(s) and Role:    Paralee Cancel, MD - Primary  PHYSICIAN ASSISTANT: Costella Hatcher, PA-C  ANESTHESIA:   regional and spinal  EBL:  75 mL   BLOOD ADMINISTERED:none  DRAINS: none   LOCAL MEDICATIONS USED:  NONE  SPECIMEN:  No Specimen  DISPOSITION OF SPECIMEN:  N/A  COUNTS:  YES  TOURNIQUET:   Total Tourniquet Time Documented: Thigh (Left) - 31 minutes Total: Thigh (Left) - 31 minutes   DICTATION: .Other Dictation: Dictation Number [id number]  PLAN OF CARE: Discharge to home after PACU  PATIENT DISPOSITION:  PACU - hemodynamically stable.   Delay start of Pharmacological VTE agent (>24hrs) due to surgical blood loss or risk of bleeding: no

## 2021-09-16 NOTE — Transfer of Care (Signed)
Immediate Anesthesia Transfer of Care Note  Patient: [name]  Procedure(s) Performed: Procedure(s): UNICOMPARTMENTAL KNEE MEDIALLY (Left)  Patient Location: PACU  Anesthesia Type:Spinal  Level of Consciousness:  sedated, patient cooperative and responds to stimulation  Airway & Oxygen Therapy:Patient Spontanous Breathing and Patient connected to face mask oxgen  Post-op Assessment:  Report given to PACU RN and Post -op Vital signs reviewed and stable  Post vital signs:  Reviewed and stable  Last Vitals:  Vitals:   09/16/21 0540 09/16/21 0908  BP: 137/81 114/64  Pulse: 76 77  Resp: 20 12  Temp: 37 C 36.4 C  SpO2: 67% 425%    Complications: No apparent anesthesia complications

## 2021-09-16 NOTE — Anesthesia Postprocedure Evaluation (Signed)
Anesthesia Post Note  Patient: [name]  Procedure(s) Performed: UNICOMPARTMENTAL KNEE MEDIALLY (Left: Knee)     Patient location during evaluation: PACU Anesthesia Type: General Level of consciousness: awake Pain management: pain level controlled Vital Signs Assessment: post-procedure vital signs reviewed and stable Respiratory status: spontaneous breathing Cardiovascular status: stable Postop Assessment: no headache, no backache, spinal receding, patient able to bend at knees and no apparent nausea or vomiting Anesthetic complications: no   No notable events documented.  Last Vitals:  Vitals:   09/16/21 0953 09/16/21 1000  BP: 101/63 97/68  Pulse: 71 66  Resp: 15 17  Temp:    SpO2: 98% 96%    Last Pain:  Vitals:   09/16/21 1000  TempSrc:   PainSc: 0-No pain    LLE Motor Response: No movement due to regional block (spinal) (09/16/21 1000) LLE Sensation: No sensation (absent) (09/16/21 1000) RLE Motor Response: No movement due to regional block (spinal) (09/16/21 1000) RLE Sensation: No sensation (absent) (09/16/21 1000) L Sensory Level: L4-Anterior knee, lower leg (09/16/21 1000) R Sensory Level: L4-Anterior knee, lower leg (09/16/21 1000)  Huston Foley

## 2021-09-16 NOTE — Care Plan (Signed)
Ortho Bundle Case Management Note  Patient Details  Name: [name] MRN: [id number] Date of Birth: 05/20/1951  L UKA on 09-16-21 DCP:  Home with wife. DME:  RW ordered through [facility]:  [place]                   DME Arranged:  Gilford Rile rolling DME Agency:  [employer]  HH Arranged:  NA [place] Agency:  NA  Additional Comments: Please contact me with any questions of if this plan should need to change.  Marianne Sofia, RN,CCM [facility]  [phone] 09/16/2021, 8:18 AM

## 2021-09-16 NOTE — Evaluation (Signed)
Physical Therapy Evaluation Patient Details Name: [name] MRN: [id number] DOB: [birth year] Today's Date: 09/16/2021  History of Present Illness  Patient is 70 y.o. male s/p Lt UKA on 09/16/21 with PMH significant for anemia, OA, GERD, HLD, HTN.  Clinical Impression  [name] is a 70 y.o. male POD 0 s/p lt UKA. Patient reports independence with mobility at baseline. Patient is now limited by functional impairments (see PT problem list below) and requires supervision/min guard for transfers and gait with RW. Patient was able to ambulate ~100 feet with RW and min guard/supervision and cues for safe walker management. Patient educated on safe sequencing for stair mobility and verbalized safe guarding position for people assisting with mobility. Patient instructed in exercises to facilitate ROM and circulation. Patient will benefit from continued skilled PT interventions to address impairments and progress towards PLOF. Patient has met mobility goals at adequate level for discharge home; will continue to follow if pt continues acute stay to progress towards Mod I goals.        Recommendations for follow up therapy are one component of a multi-disciplinary discharge planning process, led by the attending physician.  Recommendations may be updated based on patient status, additional functional criteria and insurance authorization.  Follow Up Recommendations Follow physician's recommendations for discharge plan and follow up therapies    Assistance Recommended at Discharge Intermittent Supervision/Assistance  Functional Status Assessment Patient has had a recent decline in their functional status and demonstrates the ability to make significant improvements in function in a reasonable and predictable amount of time.  Equipment Recommendations  Rolling walker (2 wheels)    Recommendations for Other Services       Precautions / Restrictions Precautions Precautions:  Fall Restrictions Weight Bearing Restrictions: No Other Position/Activity Restrictions: WBAT      Mobility  Bed Mobility Overal bed mobility: Needs Assistance Bed Mobility: Sit to Supine;Supine to Sit     Supine to sit: Supervision Sit to supine: Supervision   General bed mobility comments: pt taking extra time, supervision for safety    Transfers Overall transfer level: Needs assistance Equipment used: Rolling walker (2 wheels) Transfers: Sit to/from Stand Sit to Stand: Supervision           General transfer comment: cues for technique with hand placement on RW, pt steady, supervision for safety    Ambulation/Gait Ambulation/Gait assistance: Min guard;Supervision Gait Distance (Feet): 100 Feet Assistive device: Rolling walker (2 wheels) Gait Pattern/deviations: Step-to pattern;Decreased stride length;Step-through pattern;Decreased weight shift to left Gait velocity: decr     General Gait Details: cues for step pattern and proximity to RW, pt maintained safe proximity throughout, no LOB or buckling at Lt knee.  Stairs Stairs: Yes Stairs assistance: Min guard;Min assist Stair Management: No rails;Step to pattern;Forwards;With walker Number of Stairs: 2 General stair comments: cues for sequencing, "up with good, down with bad" no overt LOB noted and guard/assist provided for walker stability.  Wheelchair Mobility    Modified Rankin (Stroke Patients Only)       Balance Overall balance assessment: Needs assistance Sitting-balance support: Feet supported Sitting balance-Leahy Scale: Good     Standing balance support: During functional activity;Bilateral upper extremity supported;Reliant on assistive device for balance Standing balance-Leahy Scale: Fair                               Pertinent Vitals/Pain Pain Assessment: No/denies pain    Home Living Family/patient expects to  be discharged to:: Private residence Living Arrangements:  Spouse/significant other Available Help at Discharge: Family Type of Home: House Home Access: Stairs to enter Entrance Stairs-Rails: None Entrance Stairs-Number of Steps: 2   Home Layout: One level Home Equipment: [place] - single point;Shower seat;Grab bars - tub/shower      Prior Function Prior Level of Function : Independent/Modified Independent                     Hand Dominance   Dominant Hand: Right    Extremity/Trunk Assessment   Upper Extremity Assessment Upper Extremity Assessment: Overall WFL for tasks assessed    Lower Extremity Assessment Lower Extremity Assessment: LLE deficits/detail LLE Deficits / Details: good quad acitvation, no extensor lag with SLR LLE Sensation: WNL LLE Coordination: WNL    Cervical / Trunk Assessment Cervical / Trunk Assessment: Normal  Communication   Communication: No difficulties  Cognition Arousal/Alertness: Awake/alert Behavior During Therapy: WFL for tasks assessed/performed Overall Cognitive Status: Within Functional Limits for tasks assessed                                          General Comments      Exercises Total Joint Exercises Ankle Circles/Pumps: AROM;Both;10 reps;Supine Quad Sets: AROM;Other reps (comment);Supine;Left Short Arc Quad: AROM;Other reps (comment);Supine;Left Heel Slides: AROM;Other reps (comment);Supine;Left Hip ABduction/ADduction: AROM;Other reps (comment);Supine;Left Straight Leg Raises: AROM;Other reps (comment);Supine;Left   Assessment/Plan    PT Assessment Patient needs continued PT services  PT Problem List Decreased strength;Decreased range of motion;Decreased activity tolerance;Decreased balance;Decreased mobility;Decreased knowledge of use of DME;Decreased knowledge of precautions;Pain       PT Treatment Interventions DME instruction;Gait training;Functional mobility training;Stair training;Therapeutic activities;Therapeutic exercise;Balance  training;Patient/family education    PT Goals (Current goals can be found in the Care Plan section)  Acute Rehab PT Goals Patient Stated Goal: get home today PT Goal Formulation: With patient Time For Goal Achievement: 09/23/21 Potential to Achieve Goals: Good    Frequency 7X/week   Barriers to discharge        Co-evaluation               AM-PAC PT "6 Clicks" Mobility  Outcome Measure Help needed turning from your back to your side while in a flat bed without using bedrails?: None Help needed moving from lying on your back to sitting on the side of a flat bed without using bedrails?: None Help needed moving to and from a bed to a chair (including a wheelchair)?: A Little Help needed standing up from a chair using your arms (e.g., wheelchair or bedside chair)?: A Little Help needed to walk in hospital room?: A Little Help needed climbing 3-5 steps with a railing? : A Little 6 Click Score: 20    End of Session Equipment Utilized During Treatment: Gait belt Activity Tolerance: Patient tolerated treatment well Patient left: in bed;with call bell/phone within reach Nurse Communication: Mobility status PT Visit Diagnosis: Muscle weakness (generalized) (M62.81);Difficulty in walking, not elsewhere classified (R26.2)    Time: 6384-5364 PT Time Calculation (min) (ACUTE ONLY): 28 min   Charges:   PT Evaluation $PT Eval Low Complexity: 1 Low PT Treatments $Gait Training: 8-22 mins        Verner Mould, DPT Acute Rehabilitation Services Office [phone] Pager [phone]   Jacques Navy 09/16/2021, 1:13 PM

## 2021-09-16 NOTE — Anesthesia Procedure Notes (Signed)
Anesthesia Regional Block: Adductor canal block   Pre-Anesthetic Checklist: , timeout performed,  Correct Patient, Correct Site, Correct Laterality,  Correct Procedure, Correct Position, site marked,  Risks and benefits discussed,  Surgical consent,  Pre-op evaluation,  At surgeon's request and post-op pain management  Laterality: Lower and Left  Prep: chloraprep       Needles:  Injection technique: Single-shot  Needle Type: Echogenic Stimulator Needle     Needle Length: 9cm  Needle Gauge: 20   Needle insertion depth: 3 cm   Additional Needles:   Procedures:,,,, ultrasound used (permanent image in chart),,    Narrative:  Start time: 09/16/2021 7:00 AM End time: 09/16/2021 7:08 AM Injection made incrementally with aspirations every 5 mL.  Performed by: Personally  Anesthesiologist: Lyn Hollingshead, MD

## 2021-09-16 NOTE — Anesthesia Procedure Notes (Signed)
Spinal  Patient location during procedure: OR Start time: 09/16/2021 7:26 AM End time: 09/16/2021 7:29 AM Reason for block: surgical anesthesia Staffing Performed: anesthesiologist  Anesthesiologist: Lyn Hollingshead, MD Preanesthetic Checklist Completed: patient identified, IV checked, site marked, risks and benefits discussed, surgical consent, monitors and equipment checked, pre-op evaluation and timeout performed Spinal Block Patient position: sitting Prep: DuraPrep and site prepped and draped Patient monitoring: continuous pulse ox and blood pressure Approach: midline Location: L3-4 Injection technique: single-shot Needle Needle type: Pencan  Needle gauge: 24 G Needle length: 10 cm Needle insertion depth: 5 cm Assessment Sensory level: T8 Events: CSF return

## 2021-09-16 NOTE — Discharge Instructions (Signed)

## 2021-09-16 NOTE — Op Note (Signed)
NAME: [name], [name] MEDICAL RECORD NO: [id number] ACCOUNT NO: [id number] DATE OF BIRTH: 05-Apr-1951 FACILITY: [facility] LOCATION: [facility] PH[facility]ICIAN: Pietro Cassis. Alvan Dame, MD  Operative Report   DATE OF PROCEDURE: 09/16/2021   PREOPERATIVE DIAGNOSIS:  Left knee medial compartment osteoarthritis.  POSTOPERATIVE DIAGNOSIS:  Left knee medial compartment osteoarthritis.  PROCEDURE:  Left knee medial compartment partial knee arthroplasty.  COMPONENTS USED:  Zimmer Persona partial knee arthroplasty components with a size 3 femur, a size G left medial tibial tray and a size 9 mm insert to match.  SURGEON:  Pietro Cassis. Alvan Dame, MD  ASSISTANT:  Costella Hatcher, PA-C. Note that Ms. Lu Duffel was present for the entirety of the case for preoperative positioning, perioperative management of the operative extremity and general facilitation of the case and primary wound closure.  ANESTHESIA: Preoperative regional plus spinal block anesthesia.  DRAINS:  None.  COMPLICATIONS:  None.  ESTIMATED BLOOD LOSS:  Less than 100 mL.   TOURNIQUET:  Was up for 31 minutes at 225 mmHg.  INDICATIONS:  The patient is a pleasant 70 year old male seen and evaluated for left knee pain.  He had medial based pain with radiographic evidence to support advanced degenerative changes medially.  Conservative measures failed to provide adequate  sustained relief with regards to quality of life and functional ability.  He at this point was ready to proceed with definitive treatment.  We discussed the risks and benefits and the pros and cons of partial versus total knee replacement.  Specific  risks of infection, DVT, component failure, need for future surgeries were discussed and reviewed.  Postoperative course was reviewed again comparing partial versus total knee replacement.  He elected to proceed with partial knee arthroplasty and consent  was obtained for the benefit of pain relief.  DESCRIPTION OF PROCEDURE:  The patient was  brought to the operative theater.  Once adequate anesthesia, preoperative antibiotics, Ancef administered as well as tranexamic acid and Decadron.  He was positioned supine with a left thigh tourniquet placed.   The left lower extremity was then prepped and draped in sterile fashion.  A time-out was performed identifying the patient, planned procedure, and extremity.  His leg was exsanguinated and tourniquet elevated to 225 mmHg.  Midline incision was made followed by soft tissue exposure.  A median arthrotomy was then made, taking care to protect the patellofemoral region of the knee.  Following initial exposure, attention was first directed to the tibia.  A 4 mm cut was made off  the proximal tibia using the extramedullary guide and stylus.  Following this, resection, we placed the 9 mm extension block into place and pinned it.  The distal femoral cut was then made.  The knee was then flexed and the cut was finalized.  The cut  surface of the femur was seen to be best fit with a size 3 femur.  It was pinned in place.  The drill holes and chamfer and posterior cut made.  Once this bone was removed further osteophytes were removed as well as posterior meniscus.  I then sized the  tibia to be a size G.  The G trial tray was pinned into position and then a trial reduction was carried out.  With this I found that either I would use the 8 or the 9 mm insert.  Given these findings, all the trial components were removed.  I drilled the  sclerotic bone with a drill for cement interdigitation.  The final components were opened and cement was mixed.  Following irrigation as well as injecting the synovial capsular junction of the joint with 0.25% Marcaine with epinephrine, 1 mL of Toradol  and saline the final components were then cemented on the clean and dried cut surface of the bone.  The knee was brought to extension with the 8 mm insert in place with a 2 mm stylus for compression.  Extruded cement was removed.   The tourniquet was let  down without significant hemostasis required.  Once the cement had fully cured, we removed the trial components.  I then elected to use a 9 mm insert to provide stability in extension and flexion.  This was snapped into place.  The knee was re-irrigated  with normal saline solution.  The extensor mechanism was then reapproximated using #1 Vicryl and #1 Stratafix suture.  The remainder of the wound was closed in layers with 2-0 Vicryl and a running Monocryl stitch.  The knee was then cleaned, dried and  dressed sterilely using surgical glue and Aquacel dressing.  He was then brought to the recovery room in stable condition, tolerated the procedure well.  We will plan for home discharge today as this was a partial knee arthroplasty unless he fails physical therapy.  Otherwise, we will see him back in the office in 2 weeks.     Elián.Darby D: 09/16/2021 8:56:08 am T: 09/16/2021 9:11:00 am  JOB: [id number]/ [id number]

## 2021-09-17 ENCOUNTER — Encounter (HOSPITAL_COMMUNITY): Payer: Self-pay | Admitting: Orthopedic Surgery

## 2021-09-22 DIAGNOSIS — R2689 Other abnormalities of gait and mobility: Secondary | ICD-10-CM | POA: Diagnosis not present

## 2021-09-22 DIAGNOSIS — M25662 Stiffness of left knee, not elsewhere classified: Secondary | ICD-10-CM | POA: Diagnosis not present

## 2021-09-22 DIAGNOSIS — M25562 Pain in left knee: Secondary | ICD-10-CM | POA: Diagnosis not present

## 2021-09-22 DIAGNOSIS — R531 Weakness: Secondary | ICD-10-CM | POA: Diagnosis not present

## 2021-09-22 DIAGNOSIS — Z96652 Presence of left artificial knee joint: Secondary | ICD-10-CM | POA: Diagnosis not present

## 2021-09-24 DIAGNOSIS — M25662 Stiffness of left knee, not elsewhere classified: Secondary | ICD-10-CM | POA: Diagnosis not present

## 2021-09-24 DIAGNOSIS — M25562 Pain in left knee: Secondary | ICD-10-CM | POA: Diagnosis not present

## 2021-09-24 DIAGNOSIS — R2689 Other abnormalities of gait and mobility: Secondary | ICD-10-CM | POA: Diagnosis not present

## 2021-09-24 DIAGNOSIS — Z96652 Presence of left artificial knee joint: Secondary | ICD-10-CM | POA: Diagnosis not present

## 2021-09-24 DIAGNOSIS — R531 Weakness: Secondary | ICD-10-CM | POA: Diagnosis not present

## 2021-09-26 DIAGNOSIS — M25562 Pain in left knee: Secondary | ICD-10-CM | POA: Diagnosis not present

## 2021-09-26 DIAGNOSIS — R2689 Other abnormalities of gait and mobility: Secondary | ICD-10-CM | POA: Diagnosis not present

## 2021-09-26 DIAGNOSIS — Z96652 Presence of left artificial knee joint: Secondary | ICD-10-CM | POA: Diagnosis not present

## 2021-09-26 DIAGNOSIS — M25662 Stiffness of left knee, not elsewhere classified: Secondary | ICD-10-CM | POA: Diagnosis not present

## 2021-09-26 DIAGNOSIS — R531 Weakness: Secondary | ICD-10-CM | POA: Diagnosis not present

## 2021-09-29 DIAGNOSIS — R2689 Other abnormalities of gait and mobility: Secondary | ICD-10-CM | POA: Diagnosis not present

## 2021-09-29 DIAGNOSIS — M25562 Pain in left knee: Secondary | ICD-10-CM | POA: Diagnosis not present

## 2021-09-29 DIAGNOSIS — M25662 Stiffness of left knee, not elsewhere classified: Secondary | ICD-10-CM | POA: Diagnosis not present

## 2021-09-29 DIAGNOSIS — Z96652 Presence of left artificial knee joint: Secondary | ICD-10-CM | POA: Diagnosis not present

## 2021-09-29 DIAGNOSIS — R531 Weakness: Secondary | ICD-10-CM | POA: Diagnosis not present

## 2021-10-03 DIAGNOSIS — M25662 Stiffness of left knee, not elsewhere classified: Secondary | ICD-10-CM | POA: Diagnosis not present

## 2021-10-03 DIAGNOSIS — R2689 Other abnormalities of gait and mobility: Secondary | ICD-10-CM | POA: Diagnosis not present

## 2021-10-03 DIAGNOSIS — Z96652 Presence of left artificial knee joint: Secondary | ICD-10-CM | POA: Diagnosis not present

## 2021-10-03 DIAGNOSIS — R531 Weakness: Secondary | ICD-10-CM | POA: Diagnosis not present

## 2021-10-03 DIAGNOSIS — M25562 Pain in left knee: Secondary | ICD-10-CM | POA: Diagnosis not present

## 2021-10-06 DIAGNOSIS — R531 Weakness: Secondary | ICD-10-CM | POA: Diagnosis not present

## 2021-10-06 DIAGNOSIS — R2689 Other abnormalities of gait and mobility: Secondary | ICD-10-CM | POA: Diagnosis not present

## 2021-10-06 DIAGNOSIS — Z96652 Presence of left artificial knee joint: Secondary | ICD-10-CM | POA: Diagnosis not present

## 2021-10-06 DIAGNOSIS — M25562 Pain in left knee: Secondary | ICD-10-CM | POA: Diagnosis not present

## 2021-10-06 DIAGNOSIS — M25662 Stiffness of left knee, not elsewhere classified: Secondary | ICD-10-CM | POA: Diagnosis not present

## 2021-10-07 DIAGNOSIS — M25662 Stiffness of left knee, not elsewhere classified: Secondary | ICD-10-CM | POA: Diagnosis not present

## 2021-10-07 DIAGNOSIS — M25562 Pain in left knee: Secondary | ICD-10-CM | POA: Diagnosis not present

## 2021-10-07 DIAGNOSIS — R2689 Other abnormalities of gait and mobility: Secondary | ICD-10-CM | POA: Diagnosis not present

## 2021-10-07 DIAGNOSIS — R531 Weakness: Secondary | ICD-10-CM | POA: Diagnosis not present

## 2021-10-07 DIAGNOSIS — Z96652 Presence of left artificial knee joint: Secondary | ICD-10-CM | POA: Diagnosis not present

## 2021-10-08 DIAGNOSIS — M25562 Pain in left knee: Secondary | ICD-10-CM | POA: Diagnosis not present

## 2021-10-08 DIAGNOSIS — R2689 Other abnormalities of gait and mobility: Secondary | ICD-10-CM | POA: Diagnosis not present

## 2021-10-08 DIAGNOSIS — M25662 Stiffness of left knee, not elsewhere classified: Secondary | ICD-10-CM | POA: Diagnosis not present

## 2021-10-08 DIAGNOSIS — Z96652 Presence of left artificial knee joint: Secondary | ICD-10-CM | POA: Diagnosis not present

## 2021-10-08 DIAGNOSIS — R531 Weakness: Secondary | ICD-10-CM | POA: Diagnosis not present

## 2021-10-13 DIAGNOSIS — R2689 Other abnormalities of gait and mobility: Secondary | ICD-10-CM | POA: Diagnosis not present

## 2021-10-13 DIAGNOSIS — Z96652 Presence of left artificial knee joint: Secondary | ICD-10-CM | POA: Diagnosis not present

## 2021-10-13 DIAGNOSIS — M25562 Pain in left knee: Secondary | ICD-10-CM | POA: Diagnosis not present

## 2021-10-13 DIAGNOSIS — R531 Weakness: Secondary | ICD-10-CM | POA: Diagnosis not present

## 2021-10-13 DIAGNOSIS — M25662 Stiffness of left knee, not elsewhere classified: Secondary | ICD-10-CM | POA: Diagnosis not present

## 2021-10-14 ENCOUNTER — Other Ambulatory Visit: Payer: Self-pay | Admitting: Adult Health

## 2021-10-15 DIAGNOSIS — M25662 Stiffness of left knee, not elsewhere classified: Secondary | ICD-10-CM | POA: Diagnosis not present

## 2021-10-15 DIAGNOSIS — R531 Weakness: Secondary | ICD-10-CM | POA: Diagnosis not present

## 2021-10-15 DIAGNOSIS — R2689 Other abnormalities of gait and mobility: Secondary | ICD-10-CM | POA: Diagnosis not present

## 2021-10-15 DIAGNOSIS — M25562 Pain in left knee: Secondary | ICD-10-CM | POA: Diagnosis not present

## 2021-10-15 DIAGNOSIS — Z96652 Presence of left artificial knee joint: Secondary | ICD-10-CM | POA: Diagnosis not present

## 2021-10-22 DIAGNOSIS — M25562 Pain in left knee: Secondary | ICD-10-CM | POA: Diagnosis not present

## 2021-10-22 DIAGNOSIS — R531 Weakness: Secondary | ICD-10-CM | POA: Diagnosis not present

## 2021-10-22 DIAGNOSIS — Z96652 Presence of left artificial knee joint: Secondary | ICD-10-CM | POA: Diagnosis not present

## 2021-10-22 DIAGNOSIS — M25662 Stiffness of left knee, not elsewhere classified: Secondary | ICD-10-CM | POA: Diagnosis not present

## 2021-10-22 DIAGNOSIS — R2689 Other abnormalities of gait and mobility: Secondary | ICD-10-CM | POA: Diagnosis not present

## 2021-10-24 DIAGNOSIS — R2689 Other abnormalities of gait and mobility: Secondary | ICD-10-CM | POA: Diagnosis not present

## 2021-10-24 DIAGNOSIS — R531 Weakness: Secondary | ICD-10-CM | POA: Diagnosis not present

## 2021-10-24 DIAGNOSIS — M25662 Stiffness of left knee, not elsewhere classified: Secondary | ICD-10-CM | POA: Diagnosis not present

## 2021-10-24 DIAGNOSIS — M25562 Pain in left knee: Secondary | ICD-10-CM | POA: Diagnosis not present

## 2021-10-24 DIAGNOSIS — Z96652 Presence of left artificial knee joint: Secondary | ICD-10-CM | POA: Diagnosis not present

## 2021-10-29 DIAGNOSIS — Z96652 Presence of left artificial knee joint: Secondary | ICD-10-CM | POA: Diagnosis not present

## 2021-10-29 DIAGNOSIS — Z4789 Encounter for other orthopedic aftercare: Secondary | ICD-10-CM | POA: Diagnosis not present

## 2021-10-29 DIAGNOSIS — Z471 Aftercare following joint replacement surgery: Secondary | ICD-10-CM | POA: Diagnosis not present

## 2021-10-31 ENCOUNTER — Other Ambulatory Visit: Payer: Self-pay | Admitting: Neurology

## 2021-11-06 DIAGNOSIS — E785 Hyperlipidemia, unspecified: Secondary | ICD-10-CM | POA: Diagnosis not present

## 2021-11-11 DIAGNOSIS — G2581 Restless legs syndrome: Secondary | ICD-10-CM | POA: Diagnosis not present

## 2021-11-11 DIAGNOSIS — D638 Anemia in other chronic diseases classified elsewhere: Secondary | ICD-10-CM | POA: Diagnosis not present

## 2021-11-11 DIAGNOSIS — Z96652 Presence of left artificial knee joint: Secondary | ICD-10-CM | POA: Diagnosis not present

## 2021-11-11 DIAGNOSIS — Z23 Encounter for immunization: Secondary | ICD-10-CM | POA: Diagnosis not present

## 2021-11-11 DIAGNOSIS — S68119S 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, sequela: Secondary | ICD-10-CM | POA: Insufficient documentation

## 2021-11-11 DIAGNOSIS — F321 Major depressive disorder, single episode, moderate: Secondary | ICD-10-CM | POA: Diagnosis not present

## 2021-11-11 DIAGNOSIS — G4733 Obstructive sleep apnea (adult) (pediatric): Secondary | ICD-10-CM | POA: Diagnosis not present

## 2021-11-11 DIAGNOSIS — G4700 Insomnia, unspecified: Secondary | ICD-10-CM | POA: Diagnosis not present

## 2021-11-11 DIAGNOSIS — E782 Mixed hyperlipidemia: Secondary | ICD-10-CM | POA: Diagnosis not present

## 2021-11-11 DIAGNOSIS — I129 Hypertensive chronic kidney disease with stage 1 through stage 4 chronic kidney disease, or unspecified chronic kidney disease: Secondary | ICD-10-CM | POA: Diagnosis not present

## 2021-11-11 DIAGNOSIS — N1831 Chronic kidney disease, stage 3a: Secondary | ICD-10-CM | POA: Diagnosis not present

## 2021-11-18 DIAGNOSIS — S68111D Complete traumatic metacarpophalangeal amputation of left index finger, subsequent encounter: Secondary | ICD-10-CM | POA: Diagnosis not present

## 2021-11-18 DIAGNOSIS — S68111A Complete traumatic metacarpophalangeal amputation of left index finger, initial encounter: Secondary | ICD-10-CM | POA: Diagnosis not present

## 2021-11-18 DIAGNOSIS — S68113D Complete traumatic metacarpophalangeal amputation of left middle finger, subsequent encounter: Secondary | ICD-10-CM | POA: Diagnosis not present

## 2021-11-25 DIAGNOSIS — I10 Essential (primary) hypertension: Secondary | ICD-10-CM | POA: Diagnosis not present

## 2021-11-25 DIAGNOSIS — E782 Mixed hyperlipidemia: Secondary | ICD-10-CM | POA: Diagnosis not present

## 2021-11-26 DIAGNOSIS — M25561 Pain in right knee: Secondary | ICD-10-CM | POA: Diagnosis not present

## 2021-11-26 DIAGNOSIS — M1711 Unilateral primary osteoarthritis, right knee: Secondary | ICD-10-CM | POA: Diagnosis not present

## 2021-12-09 ENCOUNTER — Ambulatory Visit (INDEPENDENT_AMBULATORY_CARE_PROVIDER_SITE_OTHER): Payer: Medicare Other | Admitting: Neurology

## 2021-12-09 ENCOUNTER — Encounter: Payer: Self-pay | Admitting: Neurology

## 2021-12-09 VITALS — BP 145/88 | HR 80 | Ht 68.0 in | Wt 216.0 lb

## 2021-12-09 DIAGNOSIS — Z9989 Dependence on other enabling machines and devices: Secondary | ICD-10-CM | POA: Diagnosis not present

## 2021-12-09 DIAGNOSIS — G2581 Restless legs syndrome: Secondary | ICD-10-CM

## 2021-12-09 DIAGNOSIS — G4700 Insomnia, unspecified: Secondary | ICD-10-CM | POA: Diagnosis not present

## 2021-12-09 DIAGNOSIS — G4733 Obstructive sleep apnea (adult) (pediatric): Secondary | ICD-10-CM

## 2021-12-09 MED ORDER — PRAMIPEXOLE DIHYDROCHLORIDE 0.5 MG PO TABS: ORAL_TABLET | ORAL | 3 refills | Status: DC

## 2021-12-09 NOTE — Progress Notes (Signed)
[facility]  PATIENT: [name] DOB: Mar 16, 1951  REFERRING DOCTOR OR PCP:  Sinda Du SOURCE: patient, notes from Dr. Luan Pulling, imaging reports, MRI images on PACS  _________________________________   HISTORICAL  CHIEF COMPLAINT:  Chief Complaint  Patient presents with   Follow-up    Rm 1, pt alone, states that RLS has worsened and he is interested in asking if there are alternative medications that may help    HISTORY OF PRESENT ILLNESS:  [name] is a 71 y.o. man restless leg syndrome.      Update 12/09/2021: He is noting more RLS symptoms.  This occurs despite 900 mg of gabapentin and 0.5 mg Mirapex.   He is also on quetiapine and rarely a zolpidem.   He started Cymbalta > 1 year -it helped leg pain as well as mood.    RLS bothers him the most as soon as he lays down but symptoms start in the evening and occasionally early morning  He has OSA and uses CPAP every night and even for naps.     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   He had surgery 4/20 by Dr. Ellene Route and is better.   Legs no longer hurt.    He lost two left fingers with a table saw accident last year.    10/10/2019` NCV/EMG study shows 1.  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 2.   No superimposed polyneuropathy.     MRI of the lumbar spine 09/16/2016 shows severe spinal stenosis at L4-L5 at that level. There is facet and ligamentum flavum hypertrophy and congenitally short pedicles. There is also a left lateral disc bulge. At L5-S1 there is facet hypertrophy and a right lateral disc bulge that does not compress any nerve roots.    MRI of the brain 11/10/2016 was normal.   I also reviewed an MRI of the cervical spine from 2012. He has borderline to mild spinal stenosis at a couple levels in the mid cervical spine and multilevel narrowing though there is no definite nerve root  compression.  The worse foraminal narrowing is to the left at C7-T1 that could affect the C8 nerve root.  REVIEW OF SYSTEMS: Constitutional: No fevers, chills, sweats, or change in appetite Eyes: No visual changes, double vision, eye pain Ear, nose and throat: No hearing loss, ear pain, nasal congestion, sore throat Cardiovascular: No chest pain, palpitations Respiratory:  No shortness of breath at rest or with exertion.   No wheezes.   He has OSA on CPAP GastrointestinaI: No nausea, vomiting, diarrhea, abdominal pain, fecal incontinence Genitourinary:  No dysuria, urinary retention or frequency.  No nocturia. Musculoskeletal:  Reports mild neck pain, back pain Integumentary: No rash, pruritus, skin lesions Neurological: as above Psychiatric: No depression at this time.  No anxiety Endocrine: No palpitations, diaphoresis, change in appetite, change in weigh or increased thirst Hematologic/Lymphatic:  No anemia, purpura, petechiae. Allergic/Immunologic: No itchy/runny eyes, nasal congestion, recent allergic reactions, rashes  ALLERGIES: No Known Allergies   HOME MEDICATIONS:  Current Outpatient Medications:    cetirizine (ZYRTEC) 10 MG tablet, Take 10 mg by mouth daily., Disp: , Rfl:    DULoxetine (CYMBALTA) 30 MG capsule, Take 30 mg by mouth 2 (two) times daily., Disp: , Rfl:    fluticasone (FLONASE) 50 MCG/ACT nasal spray, Place 2 sprays into both nostrils daily., Disp: , Rfl:    gabapentin (NEURONTIN) 300 MG  capsule, TAKE 1 CAPSULE BY MOUTH THREE TIMES DAILY. MD REQ APPOINTMENT FOR FURTHER REFILLS., Disp: 270 capsule, Rfl: 0   hydrochlorothiazide (MICROZIDE) 12.5 MG capsule, Take 12.5 mg by mouth every morning. , Disp: , Rfl:    QUEtiapine (SEROQUEL) 50 MG tablet, Take 50 mg by mouth at bedtime., Disp: , Rfl:    pramipexole (MIRAPEX) 0.5 MG tablet, TAKE 1 TABLET AT DINNER AND 1 TABLET AT BEDTIME. 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   Sleep apnea    uses Cpap    PAST SURGICAL HISTORY: Past Surgical History:  Procedure Laterality Date   BACK SURGERY     lumbar 2020   CATARACT EXTRACTION     10-15 years ago   COLONOSCOPY  05/25/2012   Procedure: COLONOSCOPY;  Surgeon: Rogene Houston, MD;  Location: [facility];  Service: Endoscopy;  Laterality: N/A;  1030   COLONOSCOPY N/A 05/14/2017   Procedure: COLONOSCOPY;  Surgeon: Rogene Houston, MD;  Location: [facility];  Service: Endoscopy;  Laterality: N/A;  10:10   CYSTOSCOPY/RETROGRADE/URETEROSCOPY Right 06/07/2014   Procedure: CYSTOSCOPY, RIGHT RETROGRADE, RIGHT URETEROSCOPY, RIGHT URETERAL BALLOON DILATATION, BIOPSY, RIGHT URETERAL STENT;  Surgeon: Jorja Loa, MD;  Location: [facility];  Service: Urology;  Laterality: Right;   I & D EXTREMITY Left 05/01/2021   Procedure: LEFT INDEX AND LONG FINGER AMPUTATION AND RING FINGER NAIL BED REPAIR.;  Surgeon: Leanora Cover, MD;  Location: [facility];  Service: Orthopedics;  Laterality: Left;  LEFT INDEX AND LONG FINGER AMPUTATION AND RING FINGER NAIL BED REPAIR.   kidney stone  1983   Had partial right nephrectomy   KNEE ARTHROSCOPY WITH MEDIAL MENISECTOMY Right 04/14/2018   Procedure: KNEE ARTHROSCOPY WITH MEDIAL MENISECTOMY;  Surgeon: Carole Civil, MD;  Location: [facility];  Service: Orthopedics;  Laterality: Right;   left foot surgery  2010   repair of joint   PARTIAL KNEE ARTHROPLASTY Left 09/16/2021   Procedure: UNICOMPARTMENTAL KNEE MEDIALLY;  Surgeon: Paralee Cancel, MD;  Location: [facility];  Service: Orthopedics;  Laterality: Left;   WRIST SURGERY  Bilateral     FAMILY HISTORY: Family History  Problem Relation Age of Onset   Cancer Father        Bladder cancer   Lung cancer Brother    Healthy Mother    Healthy Brother    Healthy Brother     SOCIAL HISTORY:  Social History   Socioeconomic History   Marital status: Married    Spouse name: Not on file   Number of children: Not on file   Years of education: Not on file   Highest education level: Master's degree (e.g., MA, MS, MEng, MEd, MSW, MBA)  Occupational History   Occupation: Retired    Comment: Medical illustrator for [employer]  Tobacco Use   Smoking status: Never   Smokeless tobacco: Never  Scientific laboratory technician Use: Never used  Substance and Sexual Activity   Alcohol use: No    Comment: rare   Drug use: No  Sexual activity: Yes    Birth control/protection: None  Other Topics Concern   Not on file  Social History Narrative   He is married.  Has no children.  Is retired Medical illustrator for [employer].  He has an occasional cigar but not anything routine.   December 2020: He said he used to walk about 2 to 3 miles a day with his wife, but now she keeps going and he has to stop because of dyspnea.   Social Determinants of Health   Financial Resource Strain: Not on file  Food Insecurity: Not on file  Transportation Needs: Not on file  Physical Activity: Not on file  Stress: Not on file  Social Connections: Not on file  Intimate Partner Violence: Not on file     PHYSICAL EXAM  Vitals:   12/09/21 1114  BP: (!) 145/88  Pulse: 80  Weight: 216 lb (98 kg)  Height: 5\' 8"  (1.727 m)    Body mass index is 32.84 kg/m.   General: The patient is well-developed and well-nourished and in no acute distress   Skin: There are no rashes.  No edema.  Neurologic Exam  Mental status: The patient is alert and oriented x 3 at the time of the examination. The patient has apparent normal recent and remote memory, with an apparently normal attention span and concentration  ability.   Speech is normal.  Cranial nerves: Extraocular movements are full.  Facial strength is normal..   No obvious hearing deficits are noted.  Motor:  Muscle bulk is normal.   Tone is normal. Strength is  5 / 5 in all 4 extremities.   Sensory: He has intact sensation to touch and vibration in the arms.  Vibration sensation is mildly reduced at the ankles and toes.    Coordination: Cerebellar testing reveals good finger-nose-finger and heel-to-shin bilaterally.  Gait and station: Station is normal.   The gait and tandem gait are normal.  Romberg is negative.  Reflexes: Deep tendon reflexes are symmetric and normal bilaterally.       DIAGNOSTIC DATA (LABS, IMAGING, TESTING) - I reviewed patient records, labs, notes, testing and imaging myself where available.  Lab Results  Component Value Date   WBC 6.9 09/05/2021   HGB 13.7 09/05/2021   HCT 40.9 09/05/2021   MCV 90.1 09/05/2021   PLT 215 09/05/2021      Component Value Date/Time   NA 139 09/05/2021 1122   NA 145 (H) 11/27/2019 1002   K 4.2 09/05/2021 1122   CL 104 09/05/2021 1122   CO2 28 09/05/2021 1122   GLUCOSE 85 09/05/2021 1122   BUN 22 09/05/2021 1122   BUN 16 11/27/2019 1002   CREATININE 1.50 (H) 09/05/2021 1122   CALCIUM 9.3 09/05/2021 1122   PROT 6.8 09/05/2021 1122   PROT 6.8 12/07/2016 1026   ALBUMIN 4.1 09/05/2021 1122   AST 25 09/05/2021 1122   ALT 15 09/05/2021 1122   ALKPHOS 63 09/05/2021 1122   BILITOT 0.6 09/05/2021 1122   GFRNONAA 49 (L) 09/05/2021 1122   GFRAA 53 (L) 11/27/2019 1002       ASSESSMENT AND PLAN   Restless leg syndrome  Insomnia, unspecified type  OSA on CPAP   1.   For the restless legs, we discussed increasing the Mirapex to 2 x 0.5 mg, either both at bedtime or 1 at dinner and 1 at that time.     Continue gabapentin.  Due to grade 3 CKD, we will increase this  dose at this time.  2.    He will continue quetiapine for sleep.  There was no increase in restless leg  syndrome when he started this medication.  Consider reducing the Cymbalta if restless leg syndrome does not improve on the higher dose of pramipexole. 3.   Return in 6 months or sooner if there are new or worsening neurologic symptoms.    Duvid Smalls A. Felecia Shelling, MD, PhD 01/08/1760, 6:07 PM Certified in Neurology, Clinical Neurophysiology, Sleep Medicine, Pain Medicine and Neuroimaging  [facility] [address] [place], [place] [address] [phone]

## 2021-12-24 DIAGNOSIS — Z20822 Contact with and (suspected) exposure to covid-19: Secondary | ICD-10-CM | POA: Diagnosis not present

## 2022-01-06 DIAGNOSIS — I129 Hypertensive chronic kidney disease with stage 1 through stage 4 chronic kidney disease, or unspecified chronic kidney disease: Secondary | ICD-10-CM | POA: Diagnosis not present

## 2022-01-06 DIAGNOSIS — E6609 Other obesity due to excess calories: Secondary | ICD-10-CM | POA: Diagnosis not present

## 2022-01-06 DIAGNOSIS — N200 Calculus of kidney: Secondary | ICD-10-CM | POA: Diagnosis not present

## 2022-01-06 DIAGNOSIS — N1832 Chronic kidney disease, stage 3b: Secondary | ICD-10-CM | POA: Diagnosis not present

## 2022-01-11 DIAGNOSIS — Z20828 Contact with and (suspected) exposure to other viral communicable diseases: Secondary | ICD-10-CM | POA: Diagnosis not present

## 2022-01-14 DIAGNOSIS — I129 Hypertensive chronic kidney disease with stage 1 through stage 4 chronic kidney disease, or unspecified chronic kidney disease: Secondary | ICD-10-CM | POA: Diagnosis not present

## 2022-01-14 DIAGNOSIS — E6609 Other obesity due to excess calories: Secondary | ICD-10-CM | POA: Diagnosis not present

## 2022-01-14 DIAGNOSIS — E875 Hyperkalemia: Secondary | ICD-10-CM | POA: Diagnosis not present

## 2022-01-14 DIAGNOSIS — N200 Calculus of kidney: Secondary | ICD-10-CM | POA: Diagnosis not present

## 2022-01-14 DIAGNOSIS — N1831 Chronic kidney disease, stage 3a: Secondary | ICD-10-CM | POA: Diagnosis not present

## 2022-01-15 DIAGNOSIS — Z20822 Contact with and (suspected) exposure to covid-19: Secondary | ICD-10-CM | POA: Diagnosis not present

## 2022-01-16 DIAGNOSIS — Z20822 Contact with and (suspected) exposure to covid-19: Secondary | ICD-10-CM | POA: Diagnosis not present

## 2022-01-22 DIAGNOSIS — N200 Calculus of kidney: Secondary | ICD-10-CM | POA: Diagnosis not present

## 2022-01-22 DIAGNOSIS — I129 Hypertensive chronic kidney disease with stage 1 through stage 4 chronic kidney disease, or unspecified chronic kidney disease: Secondary | ICD-10-CM | POA: Diagnosis not present

## 2022-01-22 DIAGNOSIS — E875 Hyperkalemia: Secondary | ICD-10-CM | POA: Diagnosis not present

## 2022-01-22 DIAGNOSIS — N1831 Chronic kidney disease, stage 3a: Secondary | ICD-10-CM | POA: Diagnosis not present

## 2022-01-22 DIAGNOSIS — E6609 Other obesity due to excess calories: Secondary | ICD-10-CM | POA: Diagnosis not present

## 2022-02-15 DIAGNOSIS — Z20822 Contact with and (suspected) exposure to covid-19: Secondary | ICD-10-CM | POA: Diagnosis not present

## 2022-02-23 DIAGNOSIS — Z20822 Contact with and (suspected) exposure to covid-19: Secondary | ICD-10-CM | POA: Diagnosis not present

## 2022-02-24 DIAGNOSIS — Z20822 Contact with and (suspected) exposure to covid-19: Secondary | ICD-10-CM | POA: Diagnosis not present

## 2022-03-04 DIAGNOSIS — G4733 Obstructive sleep apnea (adult) (pediatric): Secondary | ICD-10-CM | POA: Diagnosis not present

## 2022-03-04 DIAGNOSIS — Z6832 Body mass index (BMI) 32.0-32.9, adult: Secondary | ICD-10-CM | POA: Diagnosis not present

## 2022-03-09 ENCOUNTER — Other Ambulatory Visit: Payer: Self-pay | Admitting: Adult Health

## 2022-03-10 DIAGNOSIS — M1711 Unilateral primary osteoarthritis, right knee: Secondary | ICD-10-CM | POA: Diagnosis not present

## 2022-03-26 ENCOUNTER — Telehealth: Payer: Self-pay | Admitting: Primary Care

## 2022-03-26 ENCOUNTER — Encounter: Payer: Self-pay | Admitting: Primary Care

## 2022-03-26 ENCOUNTER — Ambulatory Visit (INDEPENDENT_AMBULATORY_CARE_PROVIDER_SITE_OTHER): Payer: Medicare Other | Admitting: Primary Care

## 2022-03-26 VITALS — BP 120/72 | HR 70 | Temp 98.2°F | Ht 68.0 in | Wt 214.0 lb

## 2022-03-26 DIAGNOSIS — G2581 Restless legs syndrome: Secondary | ICD-10-CM

## 2022-03-26 DIAGNOSIS — G4733 Obstructive sleep apnea (adult) (pediatric): Secondary | ICD-10-CM | POA: Diagnosis not present

## 2022-03-26 DIAGNOSIS — Z9989 Dependence on other enabling machines and devices: Secondary | ICD-10-CM | POA: Diagnosis not present

## 2022-03-26 NOTE — Telephone Encounter (Signed)
Per [employer] the usage information is unavailable from 5/9 until now. Krystal Eaton and she states she is able to view the same thing I am and it is likely the pt has not used his machine since 5/9. Abbie Sons there looks like a difference between "no usage" and "no data". Mariann Laster states there is a possibility the pt has the machine on airplane mode which can inhibit the remote connection with [employer]. Mariann Laster states if needed the pt could bring his machine into [place] and she could get a IT trainer if the pt says he is using the machine nightly.    Beth, please advise if you would like pt to take machine to [place] for [employer]. Thanks.

## 2022-03-26 NOTE — Assessment & Plan Note (Addendum)
-   Increased fatigue symptoms last couple of years despite compliance with CPAP.  Sleep study is greater than 20 years, patient has been maintained on CPAP since then. He is unsure if he is having issues with pressure settings and/or mask seal. He is interested in inspire device.  Referred by Dr. Redmond Baseman for repeat sleep study. We will get an in lab split-night sleep study to assess for degree of sleep apnea and correct PAP pressure settings. Requesting sleep study results and compliance report from [place].

## 2022-03-26 NOTE — Patient Instructions (Addendum)
Sleep apnea is defined as period of 10 seconds or longer when you stop breathing at night. This can happen multiple times a night. Dx sleep apnea is when this occurs more than 5 times an hour.    Mild OSA 5-15 apneic events an hour Moderate OSA 15-30 apneic events an hour Severe OSA > 30 apneic events an hour   Untreated sleep apnea puts you at higher risk for cardiac arrhythmias, pulmonary HTN, stroke and diabetes   Treatment options include weight loss, side sleeping position, oral appliance, CPAP therapy or referral to ENT for possible surgical options/ inspire device    Recommendations: Focus on side sleeping position Work on weight loss efforts if appropriate  Do not drive if experiencing excessive daytime sleepiness of fatigue    Orders: Split night sleep study re: hx sleep apnea   Follow-up: Call once you have had sleep study to scheduled virtual visit to review sleep study results and treatment recommendations    Sleep Apnea Sleep apnea affects breathing during sleep. It causes breathing to stop for 10 seconds or more, or to become shallow. People with sleep apnea usually snore loudly. It can also increase the risk of: Heart attack. Stroke. Being very overweight (obese). Diabetes. Heart failure. Irregular heartbeat. High blood pressure. The goal of treatment is to help you breathe normally again. What are the causes?  The most common cause of this condition is a collapsed or blocked airway. There are three kinds of sleep apnea: Obstructive sleep apnea. This is caused by a blocked or collapsed airway. Central sleep apnea. This happens when the brain does not send the right signals to the muscles that control breathing. Mixed sleep apnea. This is a combination of obstructive and central sleep apnea. What increases the risk? Being overweight. Smoking. Having a small airway. Being older. Being male. Drinking alcohol. Taking medicines to calm yourself (sedatives or  tranquilizers). Having family members with the condition. Having a tongue or tonsils that are larger than normal. What are the signs or symptoms? Trouble staying asleep. Loud snoring. Headaches in the morning. Waking up gasping. Dry mouth or sore throat in the morning. Being sleepy or tired during the day. If you are sleepy or tired during the day, you may also: Not be able to focus your mind (concentrate). Forget things. Get angry a lot and have mood swings. Feel sad (depressed). Have changes in your personality. Have less interest in sex, if you are male. Be unable to have an erection, if you are male. How is this treated?  Sleeping on your side. Using a medicine to get rid of mucus in your nose (decongestant). Avoiding the use of alcohol, medicines to help you relax, or certain pain medicines (narcotics). Losing weight, if needed. Changing your diet. Quitting smoking. Using a machine to open your airway while you sleep, such as: An oral appliance. This is a mouthpiece that shifts your lower jaw forward. A CPAP device. This device blows air through a mask when you breathe out (exhale). An EPAP device. This has valves that you put in each nostril. A BIPAP device. This device blows air through a mask when you breathe in (inhale) and breathe out. Having surgery if other treatments do not work. Follow these instructions at home: Lifestyle Make changes that your doctor recommends. Eat a healthy diet. Lose weight if needed. Avoid alcohol, medicines to help you relax, and some pain medicines. Do not smoke or use any products that contain nicotine or tobacco. If you  need help quitting, ask your doctor. General instructions Take over-the-counter and prescription medicines only as told by your doctor. If you were given a machine to use while you sleep, use it only as told by your doctor. If you are having surgery, make sure to tell your doctor you have sleep apnea. You may need to  bring your device with you. Keep all follow-up visits. Contact a doctor if: The machine that you were given to use during sleep bothers you or does not seem to be working. You do not get better. You get worse. Get help right away if: Your chest hurts. You have trouble breathing in enough air. You have an uncomfortable feeling in your back, arms, or stomach. You have trouble talking. One side of your body feels weak. A part of your face is hanging down. These symptoms may be an emergency. Get help right away. Call your local emergency services (911 in the [place] Do not wait to see if the symptoms will go away. Do not drive yourself to the hospital. Summary This condition affects breathing during sleep. The most common cause is a collapsed or blocked airway. The goal of treatment is to help you breathe normally while you sleep. This information is not intended to replace advice given to you by your health care provider. Make sure you discuss any questions you have with your health care provider. Document Revised: 05/21/2021 Document Reviewed: 09/20/2020 Elsevier Patient Education  [place].

## 2022-03-26 NOTE — Assessment & Plan Note (Signed)
-   Significant/ long standing RLS symptoms.  Patient maintained on gabapentin and Mirapex.

## 2022-03-26 NOTE — Telephone Encounter (Signed)
Yes please

## 2022-03-26 NOTE — Progress Notes (Signed)
$'@Patient'E$  ID: [name], male    DOB: [birth year], 71 y.o.   MRN: [id number]  Chief Complaint  Patient presents with   Consult    Pt is here for sleep consult. Pt states had sleep study done 20 years ago. Pt states he does currently wear CPAP machine and wears it everynight.     Referring provider: Melida Quitter, MD  HPI: 71 year old male, never smoked.  Medical history significant for hypertension, OSA on CPAP, stage III chronic kidney disease, iron deficiency anemia, restless leg syndrome.  03/26/2022 Patient presents today for sleep consult. He is interested in Theresa device. His sleep study is >62 years old. He has been on CPAP since then. He received new machine 2 year ago. DME company is [employer]. Requesting compliance report be faxed to our office. He has noticed increased fatigue symptoms last couple of years despite compliance with CPAP. He is unsure if he is having issues with pressure setting and/or mask seal. He has symptoms of RLS. He takes gabapentin and mirapex.   Sleep questionnaire Symptoms-  RLS, sleep apnea Prior sleep study- > 20 years ago, results are not in [place] (requesting from [employer]) Bedtime- 10pm Time to fall asleep- 15 mins  Nocturnal awakenings- twice Out of bed/start of day- 5-6am Weight changes- down 15 lbs Do you operate heavy machinery- No Do you currently wear CPAP- Yes Do you current wear oxygen- No Epworth- 16  No Known Allergies  Immunization History  Administered Date(s) Administered   Influenza,inj,quad, With Preservative 07/26/2020   Influenza-Unspecified 08/26/2018   Tdap 05/01/2021   Zoster Recombinat (Shingrix) 02/24/2018, 05/23/2018    Past Medical History:  Diagnosis Date   Anemia    Arthritis    spinal stenosis   GERD (gastroesophageal reflux disease)    no longer has   History of kidney stones    Hyperlipidemia    Hypertension    Kidney function abnormal 1983   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   Sleep apnea    uses Cpap    Tobacco History: Social History   Tobacco Use  Smoking Status Never  Smokeless Tobacco Never   Counseling given: Not Answered   Outpatient Medications Prior to Visit  Medication Sig Dispense Refill   cetirizine (ZYRTEC) 10 MG tablet Take 1 tablet by mouth daily.     DULoxetine (CYMBALTA) 30 MG capsule Take 30 mg by mouth 2 (two) times daily.     gabapentin (NEURONTIN) 300 MG capsule Take 1 capsule (300 mg total) by mouth 3 (three) times daily. 270 capsule 2   hydrochlorothiazide (MICROZIDE) 12.5 MG capsule Take 12.5 mg by mouth every morning.      pramipexole (MIRAPEX) 0.5 MG tablet TAKE 1 TABLET AT DINNER AND 1 TABLET AT BEDTIME. Must keep follow up 12/09/21 for any future refills 180 tablet 3   QUEtiapine (SEROQUEL) 50 MG tablet Take 50 mg by mouth at bedtime.     traMADol (ULTRAM) 50 MG tablet Take 50 mg by mouth 2 (two) times daily as needed.     cetirizine (ZYRTEC) 10 MG tablet Take 10 mg by mouth daily.     fluticasone (FLONASE) 50 MCG/ACT nasal spray Place 2 sprays into both nostrils daily.     No facility-administered medications prior to visit.      Review of Systems  Review of Systems  Constitutional:  Positive for fatigue.  HENT: Negative.    Respiratory:  Positive for  apnea.   Psychiatric/Behavioral:  Positive for sleep disturbance.     Physical Exam  BP 120/72 (BP Location: Right Arm, Patient Position: Sitting, Cuff Size: Normal)   Pulse 70   Temp 98.2 F (36.8 C) (Oral)   Ht '5\' 8"'$  (1.727 m)   Wt 214 lb (97.1 kg)   SpO2 98%   BMI 32.54 kg/m  Physical Exam Constitutional:      General: He is not in acute distress.    Appearance: Normal appearance. He is not ill-appearing.  HENT:     Head: Normocephalic and atraumatic.     Mouth/Throat:     Comments: Mallampati class 0-I Cardiovascular:     Rate and Rhythm: Normal rate and regular rhythm.   Pulmonary:     Effort: Pulmonary effort is normal.     Breath sounds: Normal breath sounds. No wheezing, rhonchi or rales.  Musculoskeletal:        General: Normal range of motion.     Cervical back: Normal range of motion and neck supple.  Skin:    General: Skin is warm and dry.  Neurological:     General: No focal deficit present.     Mental Status: He is alert and oriented to person, place, and time. Mental status is at baseline.  Psychiatric:        Mood and Affect: Mood normal.        Behavior: Behavior normal.        Thought Content: Thought content normal.        Judgment: Judgment normal.     Lab Results:  CBC    Component Value Date/Time   WBC 6.9 09/05/2021 1122   RBC 4.54 09/05/2021 1122   HGB 13.7 09/05/2021 1122   HCT 40.9 09/05/2021 1122   PLT 215 09/05/2021 1122   MCV 90.1 09/05/2021 1122   MCH 30.2 09/05/2021 1122   MCHC 33.5 09/05/2021 1122   RDW 12.9 09/05/2021 1122   LYMPHSABS 2.4 05/01/2021 1635   MONOABS 0.9 05/01/2021 1635   EOSABS 0.2 05/01/2021 1635   BASOSABS 0.1 05/01/2021 1635    BMET    Component Value Date/Time   NA 139 09/05/2021 1122   NA 145 (H) 11/27/2019 1002   K 4.2 09/05/2021 1122   CL 104 09/05/2021 1122   CO2 28 09/05/2021 1122   GLUCOSE 85 09/05/2021 1122   BUN 22 09/05/2021 1122   BUN 16 11/27/2019 1002   CREATININE 1.50 (H) 09/05/2021 1122   CALCIUM 9.3 09/05/2021 1122   GFRNONAA 49 (L) 09/05/2021 1122   GFRAA 53 (L) 11/27/2019 1002    BNP No results found for: BNP  ProBNP No results found for: PROBNP  Imaging: No results found.   Assessment & Plan:   OSA on CPAP - Increased fatigue symptoms last couple of years despite compliance with CPAP.  Sleep study is greater than 20 years, patient has been maintained on CPAP since then. He is unsure if he is having issues with pressure settings and/or mask seal. He is interested in inspire device.  Referred by Dr. Redmond Baseman for repeat sleep study. We will get an in lab  split-night sleep study to assess for degree of sleep apnea and correct PAP pressure settings. Requesting sleep study results and compliance report from [place].   Restless leg syndrome - Significant/ long standing RLS symptoms.  Patient maintained on gabapentin and Mirapex.   Martyn Ehrich, NP 03/26/2022

## 2022-03-27 NOTE — Telephone Encounter (Signed)
Tried calling the pt and there was no answer and no VM picked up, WCB.

## 2022-03-30 NOTE — Progress Notes (Signed)
Reviewed and agree with assessment/plan.   Chesley Mires, MD [facility] Pulmonary/Critical Care 03/30/2022, 1:47 PM Pager:  [phone]

## 2022-03-31 NOTE — Telephone Encounter (Signed)
Called and spoke with patient regarding his CPAP machine, he states that he felt like he was not getting enough air from the pressure setting on his new CPAP machine.  He states he feels like he is smothering (mild feeling) and reverted back to his original machine (74 years old) that has a constant set pressure and has been using that machine for the past month.  He states he did use the new machine last night and he did ok.  He said he does not believe his old machine has an SD card in it.  I asked that he take the machine to [place] and have them get a DL off of it and have it faxed to [place].  Once we have that information we can see the setting and his pressure can be adjusted on his new machine.  I let him know that I would get this message to [facility] to let her know what is going on.  Advised him that he can always call the office to let us know the pressure issues he is having and we can adjust it.  I let him know that after his split night study on 6/12 we will know more about his setting needs.  He verbalized understanding.  Will await fax from [employer]) and then get message to [place].

## 2022-04-06 ENCOUNTER — Ambulatory Visit: Payer: Medicare Other | Attending: Primary Care | Admitting: Pulmonary Disease

## 2022-04-06 DIAGNOSIS — Z9989 Dependence on other enabling machines and devices: Secondary | ICD-10-CM | POA: Insufficient documentation

## 2022-04-06 DIAGNOSIS — G4733 Obstructive sleep apnea (adult) (pediatric): Secondary | ICD-10-CM | POA: Diagnosis not present

## 2022-04-07 DIAGNOSIS — Z9989 Dependence on other enabling machines and devices: Secondary | ICD-10-CM | POA: Diagnosis not present

## 2022-04-07 DIAGNOSIS — G4733 Obstructive sleep apnea (adult) (pediatric): Secondary | ICD-10-CM

## 2022-04-07 NOTE — Procedures (Signed)
     Patient Name: [name], [name] Date: 04/06/2022 Gender: Male D.O.B: Aug 03, 1951 Age (years): 70 Referring Provider: Geraldo Pitter NP Height (inches): 23 Interpreting Physician: Chesley Mires MD, ABSM Weight (lbs): 212 RPSGT: Rosebud Poles BMI: 32 MRN: [id number] Neck Size: 16.50  CLINICAL INFORMATION Sleep Study Type: Split Night CPAP  Indication for sleep study: snoring.  Epworth Sleepiness Score: 16  SLEEP STUDY TECHNIQUE As per the AASM Manual for the Scoring of Sleep and Associated Events v2.3 (April 2016) with a hypopnea requiring 4% desaturations.  The channels recorded and monitored were frontal, central and occipital EEG, electrooculogram (EOG), submentalis EMG (chin), nasal and oral airflow, thoracic and abdominal wall motion, anterior tibialis EMG, snore microphone, electrocardiogram, and pulse oximetry. Continuous positive airway pressure (CPAP) was initiated when the patient met split night criteria and was titrated according to treat sleep-disordered breathing.  MEDICATIONS Medications self-administered by patient taken the night of the study : N/A  RESPIRATORY PARAMETERS Diagnostic  Total AHI (/hr): 39.5 RDI (/hr): 39.5 OA Index (/hr): - CA Index (/hr): 0.0 REM AHI (/hr): N/A NREM AHI (/hr): 39.5 Supine AHI (/hr): 75.5 Non-supine AHI (/hr): 3.9 Min O2 Sat (%): 85.00 Mean O2 (%): 91.78 Time below 88% (min): 2.5   Titration  Optimal Pressure (cm): 10 AHI at Optimal Pressure (/hr): 0 Min O2 at Optimal Pressure (%): 92.0 Supine % at Optimal (%): 100 Sleep % at Optimal (%): 95   SLEEP ARCHITECTURE The recording time for the entire night was 413.1 minutes.  During a baseline period of 173.9 minutes, the patient slept for 152.0 minutes in REM and nonREM, yielding a sleep efficiency of 87.4%. Sleep onset after lights out was 18.3 minutes with a REM latency of N/A minutes. The patient spent 8.22% of the night in stage N1 sleep, 76.32% in stage N2 sleep,  15.46% in stage N3 and 0% in REM.  During the titration period of 228.6 minutes, the patient slept for 202.3 minutes in REM and nonREM, yielding a sleep efficiency of 88.5%. Sleep onset after CPAP initiation was 5.8 minutes with a REM latency of 139.0 minutes. The patient spent 7.66% of the night in stage N1 sleep, 65.89% in stage N2 sleep, 7.42% in stage N3 and 19% in REM.  CARDIAC DATA The 2 lead EKG demonstrated sinus rhythm. The mean heart rate was 64.11 beats per minute. Other EKG findings include: None.  LEG MOVEMENT DATA The total Periodic Limb Movements of Sleep (PLMS) were 305. The PLMS index was 51.66 .  IMPRESSIONS - Severe obstructive sleep apnea with an AHI of 39.5 and SpO2 low of 85%. - He did well in non supine position with CPAP 7 cm H2O. - He needed CPAP 10 cm H2O in supine position, but wasn't observed in REM sleep at this pressure setting.  DIAGNOSIS - Obstructive Sleep Apnea (G47.33)  RECOMMENDATIONS - Trial of auto CPAP therapy on 5 to 15 cm H2O with a Medium size Resmed Nasal Pillow AirFit P10 mask and heated humidification. - Avoid alcohol, sedatives and other CNS depressants that may worsen sleep apnea and disrupt normal sleep architecture. - Sleep hygiene should be reviewed to assess factors that may improve sleep quality. - Weight management and regular exercise should be initiated or continued.  [Electronically signed] 04/07/2022 02:46 PM  Chesley Mires MD, ABSM Diplomate, American Board of Sleep Medicine NPI: [id number]  [place] PH: [phone]   FX: [phone] [place]

## 2022-04-08 ENCOUNTER — Telehealth: Payer: Self-pay | Admitting: Primary Care

## 2022-04-08 DIAGNOSIS — S6000XA Contusion of unspecified finger without damage to nail, initial encounter: Secondary | ICD-10-CM | POA: Diagnosis not present

## 2022-04-08 DIAGNOSIS — S61011A Laceration without foreign body of right thumb without damage to nail, initial encounter: Secondary | ICD-10-CM | POA: Diagnosis not present

## 2022-04-08 NOTE — Telephone Encounter (Signed)
Pt has a f/u scheduled with Joellen Jersey 6/19. Closing encounter.

## 2022-04-08 NOTE — Telephone Encounter (Signed)
Please set patient up for a virtual or in office visit to review split-night sleep study results that showed evidence of severe obstructive sleep apnea.  IMPRESSIONS - Severe obstructive sleep apnea with an AHI of 39.5 and SpO2 low of 85%. - He did well in non supine position with CPAP 7 cm H2O. - He needed CPAP 10 cm H2O in supine position, but wasn't observed in REM sleep at this pressure setting.   DIAGNOSIS - Obstructive Sleep Apnea (G47.33)   RECOMMENDATIONS - Trial of auto CPAP therapy on 5 to 15 cm H2O with a Medium size Resmed Nasal Pillow AirFit P10 mask and heated humidification. - Avoid alcohol, sedatives and other CNS depressants that may worsen sleep apnea and disrupt normal sleep architecture. - Sleep hygiene should be reviewed to assess factors that may improve sleep quality. - Weight management and regular exercise should be initiated or continued.

## 2022-04-13 ENCOUNTER — Telehealth: Payer: Medicare Other | Admitting: Nurse Practitioner

## 2022-04-16 ENCOUNTER — Encounter: Payer: Self-pay | Admitting: Nurse Practitioner

## 2022-04-16 ENCOUNTER — Telehealth (INDEPENDENT_AMBULATORY_CARE_PROVIDER_SITE_OTHER): Payer: Medicare Other | Admitting: Nurse Practitioner

## 2022-04-16 DIAGNOSIS — Z9989 Dependence on other enabling machines and devices: Secondary | ICD-10-CM | POA: Diagnosis not present

## 2022-04-16 DIAGNOSIS — G4733 Obstructive sleep apnea (adult) (pediatric): Secondary | ICD-10-CM

## 2022-04-16 NOTE — Assessment & Plan Note (Signed)
Excellent compliance; download not accurately reflecting as he was previously using his old machine, which he believes has a set pressure of 13 cmH2O. He still feels like he does not get enough pressure at times and wakes feeling poorly rested. Recommended we adjust his pressures to 10-20 cmH2O given breakthrough AHI and recent sleep study and re-evaluate. He would still like to see if he qualifies for [facility] as he would like to get off CPAP due to his recent issues with it. Advised he contact Dr. Redmond Baseman to discuss next steps.  Patient Instructions  Continue to use CPAP every night, minimum of 4-6 hours a night.  Change equipment every 30 days or as directed by DME. Wash your tubing with warm soap and water daily, hang to dry. Wash humidifier portion weekly.  Be aware of reduced alertness and do not drive or operate heavy machinery if experiencing this or drowsiness.  Exercise encouraged, as tolerated. Avoid or decrease alcohol consumption and medications that make you more sleepy, if possible. Notify if persistent daytime sleepiness occurs even with consistent use of CPAP.  Adjust CPAP pressure to 10-20 cmH2O  Follow up with Dr. Redmond Baseman about Dawna Part.  Follow up in one month with Dr. Halford Chessman or Alanson Aly. If symptoms do not improve or worsen, please contact office for sooner follow up or seek emergency care.

## 2022-04-16 NOTE — Progress Notes (Signed)
Patient ID: [name], male     DOB: [birth year], 71 y.o.      MRN: [id number]  No chief complaint on file.   Virtual Visit via Video Note  I connected with [name] on 04/16/22 at 11:30 AM EDT by a video enabled telemedicine application and verified that I am speaking with the correct person using two identifiers.  Location: Patient: Home Provider: Office   I discussed the limitations of evaluation and management by telemedicine and the availability of in person appointments. The patient expressed understanding and agreed to proceed.  History of Present Illness: 71 year old male, never smoker followed for OSA on CPAP.  He is a patient of Dr. Juanetta Gosling and last seen in office on 03/26/2022 by Volanda Napoleon NP.  Past medical history significant for hypertension, CKD, IDA, RLS.  03/26/2022: OV with Volanda Napoleon NP for sleep consult.  Reportedly with interested in inspire device.  He has been on CPAP for over 20 years with last study greater than 20 years ago.  He did get a new machine 2 years ago.  He has noticed increasing fatigue symptoms over the last couple years despite compliance with CPAP.  He is unsure that if he is having issues with pressure setting or mask seal.  Epworth 16.  Referred to Dr. Redmond Baseman for evaluation for inspire device.  Ordered split lab study to assess for degree of sleep apnea and correct PAP pressure.  He is maintained on gabapentin and Mirapex for RLS.  04/16/2022: Today -follow-up Patient presents today for follow-up via virtual visit to discuss sleep study results.  His sleep study showed that he was well controlled in nonsupine position on 7 cm of water; however, required 10 cmH2O with supine position.  Today, he reports that he still feels fatigued in the morning when he wakes up.  He did switch to his new machine the beginning of this month and has been using it nightly.  He does still feel like he does not get quite enough pressure on 5-20 AutoSet.  He denies any morning  headaches or drowsy driving.  RLS is well controlled on Mirapex and gabapentin.  He is still interested in inspire device.  He was told by Dr. Redmond Baseman he needed to have sleep study and follow-up with [place] first.  03/13/2022-04/11/2022: AirView Download CPAP 5-20 cmH2O 14/30 days; 13 days >4 hr; av usage 7 hr 22 min Pressure median 8.7, max 14.4  Leaks median 0.3, 95th 7.2 AHI 7  No Known Allergies Immunization History  Administered Date(s) Administered   Influenza,inj,quad, With Preservative 07/26/2020   Influenza-Unspecified 08/26/2018   Tdap 05/01/2021   Zoster Recombinat (Shingrix) 02/24/2018, 05/23/2018   Past Medical History:  Diagnosis Date   Anemia    Arthritis    spinal stenosis   GERD (gastroesophageal reflux disease)    no longer has   History of kidney stones    Hyperlipidemia    Hypertension    Kidney function abnormal 1983   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   Sleep apnea    uses Cpap    Tobacco History: Social History   Tobacco Use  Smoking Status Never  Smokeless Tobacco Never   Counseling given: Not Answered   Outpatient Medications Prior to Visit  Medication Sig Dispense Refill   cetirizine (ZYRTEC) 10 MG tablet Take 1 tablet by mouth daily.     DULoxetine (CYMBALTA) 30 MG capsule Take 30 mg by mouth 2 (  two) times daily.     gabapentin (NEURONTIN) 300 MG capsule Take 1 capsule (300 mg total) by mouth 3 (three) times daily. 270 capsule 2   hydrochlorothiazide (MICROZIDE) 12.5 MG capsule Take 12.5 mg by mouth every morning.      pramipexole (MIRAPEX) 0.5 MG tablet TAKE 1 TABLET AT DINNER AND 1 TABLET AT BEDTIME. Must keep follow up 12/09/21 for any future refills 180 tablet 3   QUEtiapine (SEROQUEL) 50 MG tablet Take 50 mg by mouth at bedtime.     traMADol (ULTRAM) 50 MG tablet Take 50 mg by mouth 2 (two) times daily as needed.     No facility-administered medications prior to visit.     Review of  Systems:   Constitutional: No weight loss or gain, night sweats, fevers, chills. +fatigue HEENT: No headaches, difficulty swallowing, tooth/dental problems, or sore throat. No sneezing, itching, ear ache, nasal congestion, or post nasal drip CV:  No chest pain, orthopnea, PND, swelling in lower extremities, anasarca, dizziness, palpitations, syncope Resp: No shortness of breath with exertion or at rest. No excess mucus or change in color of mucus. No productive or non-productive. No hemoptysis. No wheezing.  No chest wall deformity Skin: No rash, lesions, ulcerations MSK:  No joint pain or swelling.  No decreased range of motion.  No back pain. Neuro: No dizziness or lightheadedness.  Psych: No depression or anxiety. Mood stable.   Observations/Objective: Patient is well-developed, well-nourished in no acute distress. A&Ox3. Resting comfortably at home. Unlabored, regular breathing. Speech is clear and coherent with logical content.   04/06/2022 PSG: AHI 39.5/h, SpO2 low of 85%. Non-supine position well controlled with CPAP 7 cmH2O; required 10 cmH2O to be controlled while supine but never observed in REM.  Assessment and Plan: OSA on CPAP Excellent compliance; download not accurately reflecting as he was previously using his old machine, which he believes has a set pressure of 13 cmH2O. He still feels like he does not get enough pressure at times and wakes feeling poorly rested. Recommended we adjust his pressures to 10-20 cmH2O given breakthrough AHI and recent sleep study and re-evaluate. He would still like to see if he qualifies for [facility] as he would like to get off CPAP due to his recent issues with it. Advised he contact Dr. Redmond Baseman to discuss next steps.  Patient Instructions  Continue to use CPAP every night, minimum of 4-6 hours a night.  Change equipment every 30 days or as directed by DME. Wash your tubing with warm soap and water daily, hang to dry. Wash humidifier portion weekly.   Be aware of reduced alertness and do not drive or operate heavy machinery if experiencing this or drowsiness.  Exercise encouraged, as tolerated. Avoid or decrease alcohol consumption and medications that make you more sleepy, if possible. Notify if persistent daytime sleepiness occurs even with consistent use of CPAP.  Adjust CPAP pressure to 10-20 cmH2O  Follow up with Dr. Redmond Baseman about Dawna Part.  Follow up in one month with Dr. Halford Chessman or Alanson Aly. If symptoms do not improve or worsen, please contact office for sooner follow up or seek emergency care.       I discussed the assessment and treatment plan with the patient. The patient was provided an opportunity to ask questions and all were answered. The patient agreed with the plan and demonstrated an understanding of the instructions.   The patient was advised to call back or seek an in-person evaluation if the symptoms worsen or if  the condition fails to improve as anticipated.  I provided 31 minutes of non-face-to-face time during this encounter.   Clayton Bibles, NP

## 2022-04-16 NOTE — Patient Instructions (Signed)
Continue to use CPAP every night, minimum of 4-6 hours a night.  Change equipment every 30 days or as directed by DME. Wash your tubing with warm soap and water daily, hang to dry. Wash humidifier portion weekly.  Be aware of reduced alertness and do not drive or operate heavy machinery if experiencing this or drowsiness.  Exercise encouraged, as tolerated. Avoid or decrease alcohol consumption and medications that make you more sleepy, if possible. Notify if persistent daytime sleepiness occurs even with consistent use of CPAP.  Adjust CPAP pressure to 10-20 cmH2O  Follow up with Dr. Redmond Baseman about [name].  Follow up in one month with Dr. Halford Chessman or Alanson Aly. If symptoms do not improve or worsen, please contact office for sooner follow up or seek emergency care.

## 2022-04-16 NOTE — Progress Notes (Signed)
Reviewed and agree with assessment/plan.   Chesley Mires, MD [facility] Pulmonary/Critical Care 04/16/2022, 2:55 PM Pager:  [phone]

## 2022-04-20 DIAGNOSIS — S61011S Laceration without foreign body of right thumb without damage to nail, sequela: Secondary | ICD-10-CM | POA: Diagnosis not present

## 2022-05-11 ENCOUNTER — Ambulatory Visit (INDEPENDENT_AMBULATORY_CARE_PROVIDER_SITE_OTHER): Payer: Medicare Other | Admitting: Dermatology

## 2022-05-11 DIAGNOSIS — Z1283 Encounter for screening for malignant neoplasm of skin: Secondary | ICD-10-CM

## 2022-05-11 DIAGNOSIS — L821 Other seborrheic keratosis: Secondary | ICD-10-CM

## 2022-05-11 DIAGNOSIS — L729 Follicular cyst of the skin and subcutaneous tissue, unspecified: Secondary | ICD-10-CM | POA: Diagnosis not present

## 2022-05-11 DIAGNOSIS — R238 Other skin changes: Secondary | ICD-10-CM

## 2022-05-11 DIAGNOSIS — D1801 Hemangioma of skin and subcutaneous tissue: Secondary | ICD-10-CM

## 2022-05-11 DIAGNOSIS — D692 Other nonthrombocytopenic purpura: Secondary | ICD-10-CM | POA: Diagnosis not present

## 2022-05-11 NOTE — Patient Instructions (Signed)
Can pick up Dermend for your bruising on arms.

## 2022-05-12 DIAGNOSIS — E782 Mixed hyperlipidemia: Secondary | ICD-10-CM | POA: Diagnosis not present

## 2022-05-14 DIAGNOSIS — Z96652 Presence of left artificial knee joint: Secondary | ICD-10-CM | POA: Diagnosis not present

## 2022-05-14 DIAGNOSIS — G4733 Obstructive sleep apnea (adult) (pediatric): Secondary | ICD-10-CM | POA: Diagnosis not present

## 2022-05-14 DIAGNOSIS — E782 Mixed hyperlipidemia: Secondary | ICD-10-CM | POA: Diagnosis not present

## 2022-05-14 DIAGNOSIS — F321 Major depressive disorder, single episode, moderate: Secondary | ICD-10-CM | POA: Diagnosis not present

## 2022-05-14 DIAGNOSIS — G4700 Insomnia, unspecified: Secondary | ICD-10-CM | POA: Diagnosis not present

## 2022-05-14 DIAGNOSIS — G2581 Restless legs syndrome: Secondary | ICD-10-CM | POA: Diagnosis not present

## 2022-05-14 DIAGNOSIS — I129 Hypertensive chronic kidney disease with stage 1 through stage 4 chronic kidney disease, or unspecified chronic kidney disease: Secondary | ICD-10-CM | POA: Diagnosis not present

## 2022-05-14 DIAGNOSIS — S68119S 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, sequela: Secondary | ICD-10-CM | POA: Diagnosis not present

## 2022-05-14 DIAGNOSIS — N1831 Chronic kidney disease, stage 3a: Secondary | ICD-10-CM | POA: Diagnosis not present

## 2022-05-14 DIAGNOSIS — D638 Anemia in other chronic diseases classified elsewhere: Secondary | ICD-10-CM | POA: Diagnosis not present

## 2022-05-14 DIAGNOSIS — Z0001 Encounter for general adult medical examination with abnormal findings: Secondary | ICD-10-CM | POA: Diagnosis not present

## 2022-05-14 DIAGNOSIS — Z125 Encounter for screening for malignant neoplasm of prostate: Secondary | ICD-10-CM | POA: Diagnosis not present

## 2022-05-18 DIAGNOSIS — I129 Hypertensive chronic kidney disease with stage 1 through stage 4 chronic kidney disease, or unspecified chronic kidney disease: Secondary | ICD-10-CM | POA: Diagnosis not present

## 2022-05-18 DIAGNOSIS — E6609 Other obesity due to excess calories: Secondary | ICD-10-CM | POA: Diagnosis not present

## 2022-05-18 DIAGNOSIS — N200 Calculus of kidney: Secondary | ICD-10-CM | POA: Diagnosis not present

## 2022-05-18 DIAGNOSIS — E875 Hyperkalemia: Secondary | ICD-10-CM | POA: Diagnosis not present

## 2022-05-18 DIAGNOSIS — N1831 Chronic kidney disease, stage 3a: Secondary | ICD-10-CM | POA: Diagnosis not present

## 2022-05-20 DIAGNOSIS — I129 Hypertensive chronic kidney disease with stage 1 through stage 4 chronic kidney disease, or unspecified chronic kidney disease: Secondary | ICD-10-CM | POA: Diagnosis not present

## 2022-05-20 DIAGNOSIS — D638 Anemia in other chronic diseases classified elsewhere: Secondary | ICD-10-CM | POA: Diagnosis not present

## 2022-05-20 DIAGNOSIS — E6609 Other obesity due to excess calories: Secondary | ICD-10-CM | POA: Diagnosis not present

## 2022-05-20 DIAGNOSIS — N200 Calculus of kidney: Secondary | ICD-10-CM | POA: Diagnosis not present

## 2022-05-20 DIAGNOSIS — N1831 Chronic kidney disease, stage 3a: Secondary | ICD-10-CM | POA: Diagnosis not present

## 2022-05-25 ENCOUNTER — Encounter: Payer: Self-pay | Admitting: *Deleted

## 2022-05-25 ENCOUNTER — Ambulatory Visit: Payer: Self-pay | Admitting: *Deleted

## 2022-05-25 NOTE — Patient Outreach (Signed)
  Care Coordination   Initial Visit Note   05/25/2022 Name: [name] MRN: [id number] DOB: 09-26-1951  [name] is a 71 y.o. year old male who sees [place] Crane, Edwinna Areola, MD for primary care. I spoke with  [name] by phone today.  What matters to the patients health and wellness today?  No Intervention Indicated.   Goals Addressed   None     SDOH assessments and interventions completed:   Yes SDOH Interventions Today    Flowsheet Row Most Recent Value  SDOH Interventions   Food Insecurity Interventions Intervention Not Indicated  Financial Strain Interventions Intervention Not Indicated  Housing Interventions Intervention Not Indicated  Physical Activity Interventions Intervention Not Indicated  Stress Interventions Intervention Not Indicated  Social Connections Interventions Intervention Not Indicated  Transportation Interventions Intervention Not Indicated       Care Coordination Interventions Activated:  No  Care Coordination Interventions:  No, not indicated.  Follow up plan: No further intervention required.  Encounter Outcome:  Pt. Visit Completed.  Nat Christen, BSW, MSW, LCSW  Licensed Education officer, [facility]  [address] [place], [place] [address] Physical Address-[address], [place], [place] [address] Toll Free Main # [phone] Fax # [phone] Cell # [phone] [email or web address]

## 2022-05-25 NOTE — Patient Instructions (Signed)
Visit Information  Thank you for taking time to visit with me today. Please don't hesitate to contact me if I can be of assistance to you.   Please call the care guide team at [phone] if you need to cancel or reschedule your appointment.   If you are experiencing a Mental Health or Behavioral Health Crisis or need someone to talk to, please call the Suicide and Crisis Lifeline: 988 call the [place] National Suicide Prevention Lifeline: 1-800-273-8255 or TTY: 1-800-799-4 TTY (1-800-799-4889) to talk to a trained counselor call 1-800-273-TALK (toll free, 24 hour hotline) go to [facility] [address], [place] ([phone]) call the [place] Crisis Line: 800-939-9988 call 911  Patient verbalizes understanding of instructions and care plan provided today and agrees to view in MyChart. Active MyChart status and patient understanding of how to access instructions and care plan via MyChart confirmed with patient.     No further follow up required.  Oluwatimileyin Vivier, BSW, MSW, LCSW  Licensed Clinical Social Worker  [facility] [place] [place] [facility]  Mailing Address-[address], [place], [place] [address] Physical Address-[address], [place], [place] [address] Toll Free Main # [phone] Fax # [phone] Cell # [phone] [email or web address]            

## 2022-06-01 ENCOUNTER — Encounter: Payer: Self-pay | Admitting: Dermatology

## 2022-06-01 NOTE — Progress Notes (Signed)
   Follow-Up Visit   Subjective  [name] is a 71 y.o. male who presents for the following: Annual Exam (Here for annual skin exam. No concerns. No history of skin cancer.).  General skin examination Location:  Duration:  Quality:  Associated Signs/Symptoms: Modifying Factors:  Severity:  Timing: Context:   Objective  Well appearing patient in no apparent distress; mood and affect are within normal limits. Full body skin exam: No atypical pigmented lesions (all checked with dermoscopy), no nonmelanoma skin cancer  Right Flank Brown textured 6 mm flattopped papule, compatible dermoscopy  Mid Back Several 2 mm smooth red dermal papules  Scalp Noninflamed subcutaneous 1 cm nodule, historically stable  Right Ear Soft Blue compressible 4 mm dermal papule  Left Forearm - Anterior, Right Forearm - Anterior Ecchymoses forearms, no history of other abnormal bleeding    A full examination was performed including scalp, head, eyes, ears, nose, lips, neck, chest, axillae, abdomen, back, buttocks, bilateral upper extremities, bilateral lower extremities, hands, feet, fingers, toes, fingernails, and toenails. All findings within normal limits unless otherwise noted below.   Assessment & Plan    Screening for malignant neoplasm of skin  Yearly skin exam.  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  Available over-the-counter.      I, Johnny Monarch, MD, have reviewed all documentation for this visit.  The documentation on 06/01/22 for the exam, diagnosis, procedures, and orders are all accurate and complete.

## 2022-06-15 DIAGNOSIS — Z6832 Body mass index (BMI) 32.0-32.9, adult: Secondary | ICD-10-CM | POA: Diagnosis not present

## 2022-06-15 DIAGNOSIS — G4733 Obstructive sleep apnea (adult) (pediatric): Secondary | ICD-10-CM | POA: Diagnosis not present

## 2022-06-17 ENCOUNTER — Other Ambulatory Visit: Payer: Self-pay | Admitting: Otolaryngology

## 2022-07-10 ENCOUNTER — Encounter (HOSPITAL_BASED_OUTPATIENT_CLINIC_OR_DEPARTMENT_OTHER): Payer: Self-pay | Admitting: Otolaryngology

## 2022-07-10 ENCOUNTER — Other Ambulatory Visit: Payer: Self-pay

## 2022-07-15 DIAGNOSIS — M1711 Unilateral primary osteoarthritis, right knee: Secondary | ICD-10-CM | POA: Diagnosis not present

## 2022-07-15 DIAGNOSIS — M25561 Pain in right knee: Secondary | ICD-10-CM | POA: Diagnosis not present

## 2022-07-20 ENCOUNTER — Encounter (HOSPITAL_BASED_OUTPATIENT_CLINIC_OR_DEPARTMENT_OTHER)
Admission: RE | Admit: 2022-07-20 | Discharge: 2022-07-20 | Disposition: A | Discharge status: Home / Self Care | Payer: Medicare Other | Source: Ambulatory Visit | Attending: Otolaryngology | Admitting: Otolaryngology

## 2022-07-20 DIAGNOSIS — N289 Disorder of kidney and ureter, unspecified: Secondary | ICD-10-CM | POA: Diagnosis not present

## 2022-07-20 DIAGNOSIS — Z79899 Other long term (current) drug therapy: Secondary | ICD-10-CM | POA: Diagnosis not present

## 2022-07-20 DIAGNOSIS — G4733 Obstructive sleep apnea (adult) (pediatric): Secondary | ICD-10-CM | POA: Diagnosis not present

## 2022-07-20 DIAGNOSIS — F418 Other specified anxiety disorders: Secondary | ICD-10-CM | POA: Diagnosis not present

## 2022-07-20 DIAGNOSIS — Z87891 Personal history of nicotine dependence: Secondary | ICD-10-CM | POA: Diagnosis not present

## 2022-07-20 DIAGNOSIS — I10 Essential (primary) hypertension: Secondary | ICD-10-CM | POA: Diagnosis not present

## 2022-07-20 LAB — BASIC METABOLIC PANEL
Anion gap: 9 (ref 5–15)
BUN: 25 mg/dL — ABNORMAL HIGH (ref 8–23)
CO2: 27 mmol/L (ref 22–32)
Calcium: 9.7 mg/dL (ref 8.9–10.3)
Chloride: 105 mmol/L (ref 98–111)
Creatinine, Ser: 1.63 mg/dL — ABNORMAL HIGH (ref 0.61–1.24)
GFR, Estimated: 44 mL/min — ABNORMAL LOW (ref >60)
Glucose, Bld: 105 mg/dL — ABNORMAL HIGH (ref 70–99)
Potassium: 4.6 mmol/L (ref 3.5–5.1)
Sodium: 141 mmol/L (ref 135–145)

## 2022-07-20 NOTE — Progress Notes (Signed)

## 2022-07-21 ENCOUNTER — Encounter (HOSPITAL_BASED_OUTPATIENT_CLINIC_OR_DEPARTMENT_OTHER): Payer: Self-pay | Admitting: Otolaryngology

## 2022-07-21 ENCOUNTER — Ambulatory Visit (HOSPITAL_BASED_OUTPATIENT_CLINIC_OR_DEPARTMENT_OTHER): Payer: Medicare Other | Admitting: Anesthesiology

## 2022-07-21 ENCOUNTER — Ambulatory Visit (HOSPITAL_BASED_OUTPATIENT_CLINIC_OR_DEPARTMENT_OTHER)
Admission: RE | Admit: 2022-07-21 | Discharge: 2022-07-21 | Disposition: A | Discharge status: Home / Self Care | Payer: Medicare Other | Attending: Otolaryngology | Admitting: Otolaryngology

## 2022-07-21 ENCOUNTER — Other Ambulatory Visit: Payer: Self-pay

## 2022-07-21 ENCOUNTER — Encounter (HOSPITAL_BASED_OUTPATIENT_CLINIC_OR_DEPARTMENT_OTHER)
Admission: RE | Disposition: A | Discharge status: Home / Self Care | Payer: Self-pay | Source: Home / Self Care | Attending: Otolaryngology

## 2022-07-21 DIAGNOSIS — N289 Disorder of kidney and ureter, unspecified: Secondary | ICD-10-CM | POA: Insufficient documentation

## 2022-07-21 DIAGNOSIS — G4733 Obstructive sleep apnea (adult) (pediatric): Secondary | ICD-10-CM | POA: Diagnosis not present

## 2022-07-21 DIAGNOSIS — Z9989 Dependence on other enabling machines and devices: Secondary | ICD-10-CM

## 2022-07-21 DIAGNOSIS — I10 Essential (primary) hypertension: Secondary | ICD-10-CM | POA: Diagnosis not present

## 2022-07-21 DIAGNOSIS — Z87891 Personal history of nicotine dependence: Secondary | ICD-10-CM | POA: Diagnosis not present

## 2022-07-21 DIAGNOSIS — Z79899 Other long term (current) drug therapy: Secondary | ICD-10-CM | POA: Insufficient documentation

## 2022-07-21 DIAGNOSIS — F418 Other specified anxiety disorders: Secondary | ICD-10-CM | POA: Insufficient documentation

## 2022-07-21 HISTORY — DX: Depression, unspecified: F32.A

## 2022-07-21 HISTORY — DX: Anxiety disorder, unspecified: F41.9

## 2022-07-21 HISTORY — PX: DRUG INDUCED ENDOSCOPY: SHX6808

## 2022-07-21 SURGERY — DRUG INDUCED SLEEP ENDOSCOPY
Anesthesia: Monitor Anesthesia Care | Site: Nose | Laterality: Right

## 2022-07-21 MED ORDER — LACTATED RINGERS IV SOLN: INTRAVENOUS | Status: DC

## 2022-07-21 MED ORDER — OXYMETAZOLINE HCL 0.05 % NA SOLN
NASAL | Status: DC | PRN
  Administered 2022-07-21: 1 via TOPICAL

## 2022-07-21 MED ORDER — ONDANSETRON HCL 4 MG/2ML IJ SOLN
INTRAMUSCULAR | Status: DC | PRN
  Administered 2022-07-21: 4 mg via INTRAVENOUS

## 2022-07-21 MED ORDER — PROPOFOL 500 MG/50ML IV EMUL
INTRAVENOUS | Status: DC | PRN
  Administered 2022-07-21: 35 ug/kg/min via INTRAVENOUS

## 2022-07-21 MED ORDER — LIDOCAINE 2% (20 MG/ML) 5 ML SYRINGE
INTRAMUSCULAR | Status: DC | PRN
  Administered 2022-07-21: 30 mg via INTRAVENOUS

## 2022-07-21 SURGICAL SUPPLY — 12 items
CANISTER SUCT 1200ML W/VALVE (MISCELLANEOUS) ×2 IMPLANT
GLOVE BIO SURGEON STRL SZ7.5 (GLOVE) ×2 IMPLANT
KIT CLEAN ENDO (MISCELLANEOUS) ×2 IMPLANT
NDL HYPO 27GX1-1/4 (NEEDLE) IMPLANT
NEEDLE HYPO 27GX1-1/4 (NEEDLE) IMPLANT
PATTIES SURGICAL .5 X3 (DISPOSABLE) ×2 IMPLANT
SHEET MEDIUM DRAPE 40X70 STRL (DRAPES) ×2 IMPLANT
SOL ANTI FOG 6CC (MISCELLANEOUS) ×2 IMPLANT
SOLUTION ANTI FOG 6CC (MISCELLANEOUS) ×1
SYR CONTROL 10ML LL (SYRINGE) IMPLANT
TOWEL GREEN STERILE FF (TOWEL DISPOSABLE) ×2 IMPLANT
TUBE CONNECTING 20X1/4 (TUBING) ×2 IMPLANT

## 2022-07-21 NOTE — Brief Op Note (Signed)
07/21/2022  12:49 PM  PATIENT:  [name]  71 y.o. male  PRE-OPERATIVE DIAGNOSIS:  Obstructive Sleep Apnea BMI 32.0-32.9,adult  POST-OPERATIVE DIAGNOSIS:  Obstructive Sleep Apnea BMI 32.0-32.9,adult  PROCEDURE:  Procedure(s): DRUG INDUCED ENDOSCOPY (Right)  SURGEON:  Surgeon(s) and Role:    Melida Quitter, MD - Primary  PHYSICIAN ASSISTANT:   ASSISTANTS: none   ANESTHESIA:   IV sedation  EBL:  None   BLOOD ADMINISTERED:none  DRAINS: none   LOCAL MEDICATIONS USED:  NONE  SPECIMEN:  No Specimen  DISPOSITION OF SPECIMEN:  N/A  COUNTS:  YES  TOURNIQUET:  * No tourniquets in log *  DICTATION: .Note written in EPIC  PLAN OF CARE: Discharge to home after PACU  PATIENT DISPOSITION:  PACU - hemodynamically stable.   Delay start of Pharmacological VTE agent (>24hrs) due to surgical blood loss or risk of bleeding: no

## 2022-07-21 NOTE — Transfer of Care (Signed)
Immediate Anesthesia Transfer of Care Note  Patient: [name]  Procedure(s) Performed: DRUG INDUCED ENDOSCOPY (Right: Nose)  Patient Location: PACU  Anesthesia Type:MAC  Level of Consciousness: awake, alert , oriented and patient cooperative  Airway & Oxygen Therapy: Patient Spontanous Breathing  Post-op Assessment: Report given to RN and Post -op Vital signs reviewed and stable  Post vital signs: Reviewed and stable  Last Vitals:  Vitals Value Taken Time  BP    Temp    Pulse 69 07/21/22 1249  Resp    SpO2 94 % 07/21/22 1249  Vitals shown include unvalidated device data.  Last Pain:  Vitals:   07/21/22 1004  TempSrc: Oral  PainSc: 0-No pain      Patients Stated Pain Goal: 3 (84/13/24 4010)  Complications: No notable events documented.

## 2022-07-21 NOTE — Anesthesia Preprocedure Evaluation (Addendum)
Anesthesia Evaluation  Patient identified by MRN, date of birth, ID band Patient awake    Reviewed: Allergy & Precautions, NPO status , Patient's Chart, lab work & pertinent test results  History of Anesthesia Complications Negative for: history of anesthetic complications  Airway Mallampati: II  TM Distance: >3 FB Neck ROM: Full    Dental no notable dental hx.    Pulmonary sleep apnea and Continuous Positive Airway Pressure Ventilation , former smoker,    Pulmonary exam normal        Cardiovascular hypertension, Pt. on medications Normal cardiovascular exam     Neuro/Psych Anxiety Depression    GI/Hepatic negative GI ROS, Neg liver ROS,   Endo/Other  negative endocrine ROS  Renal/GU Renal InsufficiencyRenal disease (Cr 1.63)  negative genitourinary   Musculoskeletal negative musculoskeletal ROS (+)   Abdominal   Peds  Hematology negative hematology ROS (+)   Anesthesia Other Findings Day of surgery medications reviewed with patient.  Reproductive/Obstetrics negative OB ROS                            Anesthesia Physical Anesthesia Plan  ASA: 2  Anesthesia Plan: MAC   Post-op Pain Management: Minimal or no pain anticipated   Induction:   PONV Risk Score and Plan: 1 and Treatment may vary due to age or medical condition and Propofol infusion  Airway Management Planned: Natural Airway and Nasal Cannula  Additional Equipment: None  Intra-op Plan:   Post-operative Plan:   Informed Consent: I have reviewed the patients History and Physical, chart, labs and discussed the procedure including the risks, benefits and alternatives for the proposed anesthesia with the patient or authorized representative who has indicated his/her understanding and acceptance.       Plan Discussed with: CRNA  Anesthesia Plan Comments:        Anesthesia Quick Evaluation

## 2022-07-21 NOTE — Anesthesia Postprocedure Evaluation (Signed)
Anesthesia Post Note  Patient: [name]  Procedure(s) Performed: DRUG INDUCED ENDOSCOPY (Right: Nose)     Patient location during evaluation: PACU Anesthesia Type: MAC Level of consciousness: awake and alert Pain management: pain level controlled Vital Signs Assessment: post-procedure vital signs reviewed and stable Respiratory status: spontaneous breathing, nonlabored ventilation and respiratory function stable Cardiovascular status: blood pressure returned to baseline Postop Assessment: no apparent nausea or vomiting Anesthetic complications: no   No notable events documented.  Last Vitals:  Vitals:   07/21/22 1249 07/21/22 1307  BP: 118/66 125/80  Pulse: 69 62  Resp: 10 18  Temp: (!) 36.1 C (!) 36.2 C  SpO2: 94% 97%    Last Pain:  Vitals:   07/21/22 1307  TempSrc: Oral  PainSc: 0-No pain                 Marthenia Rolling

## 2022-07-21 NOTE — H&P (Signed)
[name] is an 71 y.o. male.   Chief Complaint: Sleep apnea HPI: 71 year old male with obstructive sleep apnea who has been unable to tolerate CPAP.  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   Sleep apnea    uses Cpap nightly    Past Surgical History:  Procedure Laterality Date   BACK SURGERY     lumbar 2020   CATARACT EXTRACTION     10-15 years ago   COLONOSCOPY  05/25/2012   Procedure: COLONOSCOPY;  Surgeon: Rogene Houston, MD;  [facility];  Service: Endoscopy;  Laterality: N/A;  1030   COLONOSCOPY N/A 05/14/2017   Procedure: COLONOSCOPY;  Surgeon: Rogene Houston, MD;  [facility];  Service: Endoscopy;  Laterality: N/A;  10:10   CYSTOSCOPY/RETROGRADE/URETEROSCOPY Right 06/07/2014   Procedure: CYSTOSCOPY, RIGHT RETROGRADE, RIGHT URETEROSCOPY, RIGHT URETERAL BALLOON DILATATION, BIOPSY, RIGHT URETERAL STENT;  Surgeon: Jorja Loa, MD;  Location[facility];  Service: Urology;  Laterality: Right;   I & D EXTREMITY Left 05/01/2021   Procedure: LEFT INDEX AND LONG FINGER AMPUTATION AND RING FINGER NAIL BED REPAIR.;  Surgeon: Leanora Cover, MD;  Location: [place];  Service: Orthopedics;  Laterality: Left;  LEFT INDEX AND LONG FINGER AMPUTATION AND RING FINGER NAIL BED REPAIR.   kidney stone  1983   Had partial right nephrectomy   KNEE ARTHROSCOPY WITH MEDIAL MENISECTOMY Right 04/14/2018   Procedure: KNEE ARTHROSCOPY WITH MEDIAL MENISECTOMY;  Surgeon: Carole Civil, MD;  Location: [facility];  Service: Orthopedics;  Laterality: Right;   left foot surgery  2010   repair of joint   PARTIAL KNEE ARTHROPLASTY Left 09/16/2021   Procedure: UNICOMPARTMENTAL KNEE MEDIALLY;  Surgeon: Paralee Cancel, MD;  Location: [facility];  Service: Orthopedics;  Laterality: Left;   WRIST SURGERY Bilateral     Family History  Problem Relation Age of Onset   Cancer Father        Bladder cancer   Lung cancer Brother    Healthy Mother    Healthy Brother    Healthy Brother    Social History:  reports that he has quit smoking. His smoking use included cigars. He has never been exposed to tobacco smoke. He has never used smokeless tobacco. He reports current alcohol use. He reports that he does not use drugs.  Allergies:  Allergies  Allergen Reactions   Nsaids Other (See Comments)    "Cannot take because of my kidney disease."    Medications Prior to Admission  Medication Sig Dispense Refill   DULoxetine (CYMBALTA) 30 MG capsule Take 30 mg by mouth 2 (two) times daily.     gabapentin (NEURONTIN) 300 MG capsule Take 1 capsule (300 mg total) by mouth 3 (three) times daily. 270 capsule 2   hydrochlorothiazide (MICROZIDE) 12.5 MG capsule Take 12.5 mg by mouth every morning.      pramipexole (MIRAPEX) 0.5 MG tablet TAKE 1 TABLET AT DINNER AND 1 TABLET AT BEDTIME. Must keep follow up 12/09/21 for any future refills 180 tablet 3   QUEtiapine (SEROQUEL) 50 MG tablet Take 50 mg by mouth at bedtime.     traMADol (  ULTRAM) 50 MG tablet Take 50 mg by mouth 2 (two) times daily as needed.      Results for orders placed or performed during the hospital encounter of 07/21/22 (from the past 48 hour(s))  Basic metabolic panel per protocol     Status: Abnormal   Collection Time: 07/20/22 12:28 PM  Result Value Ref Range   Sodium 141 135 - 145 mmol/L   Potassium 4.6 3.5 - 5.1 mmol/L   Chloride 105 98 - 111 mmol/L   CO2 27 22 - 32 mmol/L   Glucose, Bld 105 (H) 70 - 99 mg/dL    Comment: Glucose reference range applies only to samples taken after fasting for at least 8 hours.   BUN 25 (H) 8 - 23 mg/dL   Creatinine, Ser 1.63 (H) 0.61 - 1.24 mg/dL   Calcium 9.7 8.9 - 10.3 mg/dL   GFR, Estimated 44 (L) >60  mL/min    Comment: (NOTE) Calculated using the CKD-EPI Creatinine Equation (2021)    Anion gap 9 5 - 15    Comment: Performed at [facility] [address], [place], [place] [address]   No results found.  Review of Systems  All other systems reviewed and are negative.   Blood pressure 133/73, pulse 66, temperature 97.7 F (36.5 C), temperature source Oral, height '5\' 8"'$  (1.727 m), weight 95.1 kg, SpO2 100 %. Physical Exam Constitutional:      Appearance: Normal appearance.  HENT:     Head: Normocephalic and atraumatic.     Right Ear: External ear normal.     Left Ear: External ear normal.     Nose: Nose normal.     Mouth/Throat:     Mouth: Mucous membranes are moist.     Pharynx: Oropharynx is clear.  Eyes:     Extraocular Movements: Extraocular movements intact.     Conjunctiva/sclera: Conjunctivae normal.     Pupils: Pupils are equal, round, and reactive to light.  Cardiovascular:     Rate and Rhythm: Normal rate.  Pulmonary:     Effort: Pulmonary effort is normal.  Musculoskeletal:     Cervical back: Normal range of motion.  Neurological:     General: No focal deficit present.     Mental Status: He is alert and oriented to person, place, and time.  Psychiatric:        Mood and Affect: Mood normal.        Behavior: Behavior normal.        Thought Content: Thought content normal.        Judgment: Judgment normal.      Assessment/Plan Obstructive sleep apnea, BMI 31.88  To OR for sleep endoscopy.  Melida Quitter, MD 07/21/2022, 12:26 PM

## 2022-07-21 NOTE — Discharge Instructions (Signed)

## 2022-07-21 NOTE — Anesthesia Procedure Notes (Signed)
Procedure Name: MAC Date/Time: 07/21/2022 12:45 PM  Performed by: Signe Colt, CRNAPre-anesthesia Checklist: Patient identified, Emergency Drugs available, Suction available, Patient being monitored and Timeout performed Patient Re-evaluated:Patient Re-evaluated prior to induction Oxygen Delivery Method: Simple face mask

## 2022-07-21 NOTE — Op Note (Signed)
Preop diagnosis: Obstructive sleep apnea Postop diagnosis: same Procedure: Drug-induced sleep endoscopy Surgeon: Redmond Baseman Anesth: IV sedation Compl: None Findings: There is 80% anterior-posterior collapse at the velum making him a candidate for hypoglossal nerve stimulator placement.  There was also anterior-posterior collapse at the tongue base. Description:  After discussing risks, benefits, and alternatives, the patient was brought to the operative suite and placed on the operative table in the supine position.  Anesthesia was induced and the patient was given light sedation to simulate natural sleep. When the proper level was reached, an Afrin-soaked pledget was placed in the right nasal passage for a couple of minutes and then removed.  The fiberoptic laryngoscope was then passed to view the pharynx and larynx.  Findings are noted above and the exam was recorded.  After completion, the scope was removed and the patient was returned to anesthesia for wakeup and was moved to the recovery room in stable condition.

## 2022-07-22 ENCOUNTER — Encounter (HOSPITAL_BASED_OUTPATIENT_CLINIC_OR_DEPARTMENT_OTHER): Payer: Self-pay | Admitting: Otolaryngology

## 2022-07-22 NOTE — Progress Notes (Signed)
Left message stating courtesy call and if any questions or concerns please call the doctors office.  

## 2022-07-24 ENCOUNTER — Other Ambulatory Visit: Payer: Self-pay | Admitting: Otolaryngology

## 2022-08-17 DIAGNOSIS — E6609 Other obesity due to excess calories: Secondary | ICD-10-CM | POA: Diagnosis not present

## 2022-08-17 DIAGNOSIS — I129 Hypertensive chronic kidney disease with stage 1 through stage 4 chronic kidney disease, or unspecified chronic kidney disease: Secondary | ICD-10-CM | POA: Diagnosis not present

## 2022-08-17 DIAGNOSIS — N1831 Chronic kidney disease, stage 3a: Secondary | ICD-10-CM | POA: Diagnosis not present

## 2022-08-17 DIAGNOSIS — N200 Calculus of kidney: Secondary | ICD-10-CM | POA: Diagnosis not present

## 2022-08-17 DIAGNOSIS — D638 Anemia in other chronic diseases classified elsewhere: Secondary | ICD-10-CM | POA: Diagnosis not present

## 2022-08-19 DIAGNOSIS — I129 Hypertensive chronic kidney disease with stage 1 through stage 4 chronic kidney disease, or unspecified chronic kidney disease: Secondary | ICD-10-CM | POA: Diagnosis not present

## 2022-08-19 DIAGNOSIS — N200 Calculus of kidney: Secondary | ICD-10-CM | POA: Diagnosis not present

## 2022-08-19 DIAGNOSIS — D638 Anemia in other chronic diseases classified elsewhere: Secondary | ICD-10-CM | POA: Diagnosis not present

## 2022-08-19 DIAGNOSIS — E6609 Other obesity due to excess calories: Secondary | ICD-10-CM | POA: Diagnosis not present

## 2022-08-19 DIAGNOSIS — N1831 Chronic kidney disease, stage 3a: Secondary | ICD-10-CM | POA: Diagnosis not present

## 2022-08-21 DIAGNOSIS — J069 Acute upper respiratory infection, unspecified: Secondary | ICD-10-CM | POA: Diagnosis not present

## 2022-08-31 ENCOUNTER — Other Ambulatory Visit: Payer: Self-pay

## 2022-08-31 ENCOUNTER — Encounter (HOSPITAL_BASED_OUTPATIENT_CLINIC_OR_DEPARTMENT_OTHER): Payer: Self-pay | Admitting: Otolaryngology

## 2022-09-03 ENCOUNTER — Encounter (HOSPITAL_BASED_OUTPATIENT_CLINIC_OR_DEPARTMENT_OTHER)
Admission: RE | Admit: 2022-09-03 | Discharge: 2022-09-03 | Disposition: A | Discharge status: Home / Self Care | Payer: Medicare Other | Source: Ambulatory Visit | Attending: Otolaryngology | Admitting: Otolaryngology

## 2022-09-03 DIAGNOSIS — Z01812 Encounter for preprocedural laboratory examination: Secondary | ICD-10-CM | POA: Insufficient documentation

## 2022-09-03 DIAGNOSIS — Z79899 Other long term (current) drug therapy: Secondary | ICD-10-CM | POA: Diagnosis not present

## 2022-09-03 LAB — BASIC METABOLIC PANEL
Anion gap: 7 (ref 5–15)
BUN: 22 mg/dL (ref 8–23)
CO2: 28 mmol/L (ref 22–32)
Calcium: 9.5 mg/dL (ref 8.9–10.3)
Chloride: 105 mmol/L (ref 98–111)
Creatinine, Ser: 1.5 mg/dL — ABNORMAL HIGH (ref 0.61–1.24)
GFR, Estimated: 49 mL/min — ABNORMAL LOW (ref >60)
Glucose, Bld: 101 mg/dL — ABNORMAL HIGH (ref 70–99)
Potassium: 4.6 mmol/L (ref 3.5–5.1)
Sodium: 140 mmol/L (ref 135–145)

## 2022-09-03 NOTE — Progress Notes (Signed)

## 2022-09-08 NOTE — Anesthesia Preprocedure Evaluation (Signed)
Anesthesia Evaluation  Patient identified by MRN, date of birth, ID band Patient awake    Reviewed: Allergy & Precautions, NPO status , Patient's Chart, lab work & pertinent test results  History of Anesthesia Complications Negative for: history of anesthetic complications  Airway Mallampati: I  TM Distance: >3 FB Neck ROM: Full    Dental no notable dental hx.    Pulmonary sleep apnea and Continuous Positive Airway Pressure Ventilation , former smoker   Pulmonary exam normal        Cardiovascular hypertension, Pt. on medications Normal cardiovascular exam     Neuro/Psych   Anxiety Depression    RLS    GI/Hepatic negative GI ROS, Neg liver ROS,,,  Endo/Other  negative endocrine ROS    Renal/GU Renal InsufficiencyRenal disease (Cr 1.5)  negative genitourinary   Musculoskeletal  (+) Arthritis ,    Abdominal   Peds  Hematology negative hematology ROS (+)   Anesthesia Other Findings Day of surgery medications reviewed with patient.  Reproductive/Obstetrics negative OB ROS                              Anesthesia Physical Anesthesia Plan  ASA: 2  Anesthesia Plan: General   Post-op Pain Management: Tylenol PO (pre-op)*   Induction: Intravenous  PONV Risk Score and Plan: 2 and Treatment may vary due to age or medical condition, Dexamethasone and Ondansetron  Airway Management Planned: Oral ETT  Additional Equipment: None  Intra-op Plan:   Post-operative Plan: Extubation in OR  Informed Consent: I have reviewed the patients History and Physical, chart, labs and discussed the procedure including the risks, benefits and alternatives for the proposed anesthesia with the patient or authorized representative who has indicated his/her understanding and acceptance.     Dental advisory given  Plan Discussed with: CRNA  Anesthesia Plan Comments:         Anesthesia Quick  Evaluation

## 2022-09-09 ENCOUNTER — Ambulatory Visit (HOSPITAL_BASED_OUTPATIENT_CLINIC_OR_DEPARTMENT_OTHER): Payer: Medicare Other | Admitting: Anesthesiology

## 2022-09-09 ENCOUNTER — Other Ambulatory Visit: Payer: Self-pay

## 2022-09-09 ENCOUNTER — Encounter (HOSPITAL_BASED_OUTPATIENT_CLINIC_OR_DEPARTMENT_OTHER): Payer: Self-pay | Admitting: Otolaryngology

## 2022-09-09 ENCOUNTER — Ambulatory Visit (HOSPITAL_BASED_OUTPATIENT_CLINIC_OR_DEPARTMENT_OTHER)
Admission: RE | Admit: 2022-09-09 | Discharge: 2022-09-09 | Disposition: A | Discharge status: Home / Self Care | Payer: Medicare Other | Attending: Otolaryngology | Admitting: Otolaryngology

## 2022-09-09 ENCOUNTER — Encounter (HOSPITAL_BASED_OUTPATIENT_CLINIC_OR_DEPARTMENT_OTHER)
Admission: RE | Disposition: A | Discharge status: Home / Self Care | Payer: Self-pay | Source: Home / Self Care | Attending: Otolaryngology

## 2022-09-09 ENCOUNTER — Ambulatory Visit (HOSPITAL_COMMUNITY): Payer: Medicare Other

## 2022-09-09 DIAGNOSIS — Z6832 Body mass index (BMI) 32.0-32.9, adult: Secondary | ICD-10-CM | POA: Diagnosis not present

## 2022-09-09 DIAGNOSIS — G4733 Obstructive sleep apnea (adult) (pediatric): Secondary | ICD-10-CM | POA: Insufficient documentation

## 2022-09-09 DIAGNOSIS — Z789 Other specified health status: Secondary | ICD-10-CM | POA: Diagnosis not present

## 2022-09-09 DIAGNOSIS — F419 Anxiety disorder, unspecified: Secondary | ICD-10-CM | POA: Diagnosis not present

## 2022-09-09 DIAGNOSIS — I10 Essential (primary) hypertension: Secondary | ICD-10-CM | POA: Diagnosis not present

## 2022-09-09 DIAGNOSIS — G2581 Restless legs syndrome: Secondary | ICD-10-CM | POA: Diagnosis not present

## 2022-09-09 DIAGNOSIS — M1990 Unspecified osteoarthritis, unspecified site: Secondary | ICD-10-CM | POA: Insufficient documentation

## 2022-09-09 DIAGNOSIS — F32A Depression, unspecified: Secondary | ICD-10-CM | POA: Insufficient documentation

## 2022-09-09 DIAGNOSIS — Z9989 Dependence on other enabling machines and devices: Secondary | ICD-10-CM

## 2022-09-09 DIAGNOSIS — Z79899 Other long term (current) drug therapy: Secondary | ICD-10-CM

## 2022-09-09 DIAGNOSIS — Z87891 Personal history of nicotine dependence: Secondary | ICD-10-CM | POA: Insufficient documentation

## 2022-09-09 DIAGNOSIS — Z01818 Encounter for other preprocedural examination: Secondary | ICD-10-CM

## 2022-09-09 HISTORY — PX: IMPLANTATION OF HYPOGLOSSAL NERVE STIMULATOR: SHX6827

## 2022-09-09 SURGERY — INSERTION, HYPOGLOSSAL NERVE STIMULATOR
Anesthesia: General | Site: Chest | Laterality: Right

## 2022-09-09 MED ORDER — OXYCODONE HCL 5 MG/5ML PO SOLN: 5.0000 mg | Freq: Once | ORAL | Status: AC | PRN

## 2022-09-09 MED ORDER — OXYCODONE HCL 5 MG PO TABS
5.0000 mg | ORAL_TABLET | Freq: Once | ORAL | Status: AC | PRN
  Administered 2022-09-09: 5 mg via ORAL

## 2022-09-09 MED ORDER — ACETAMINOPHEN 500 MG PO TABS
ORAL_TABLET | ORAL | Status: AC
  Filled 2022-09-09: qty 2

## 2022-09-09 MED ORDER — LACTATED RINGERS IV SOLN: INTRAVENOUS | Status: DC

## 2022-09-09 MED ORDER — HYDROCODONE-ACETAMINOPHEN 5-325 MG PO TABS: 1.0000 | ORAL_TABLET | Freq: Four times a day (QID) | ORAL | 0 refills | Status: DC | PRN

## 2022-09-09 MED ORDER — ONDANSETRON HCL 4 MG/2ML IJ SOLN
INTRAMUSCULAR | Status: DC | PRN
  Administered 2022-09-09: 4 mg via INTRAVENOUS

## 2022-09-09 MED ORDER — LIDOCAINE 2% (20 MG/ML) 5 ML SYRINGE
INTRAMUSCULAR | Status: DC | PRN
  Administered 2022-09-09: 100 mg via INTRAVENOUS

## 2022-09-09 MED ORDER — LIDOCAINE-EPINEPHRINE 1 %-1:100000 IJ SOLN
INTRAMUSCULAR | Status: DC | PRN
  Administered 2022-09-09: 5 mL

## 2022-09-09 MED ORDER — FENTANYL CITRATE (PF) 100 MCG/2ML IJ SOLN
INTRAMUSCULAR | Status: DC | PRN
  Administered 2022-09-09: 100 ug via INTRAVENOUS

## 2022-09-09 MED ORDER — 0.9 % SODIUM CHLORIDE (POUR BTL) OPTIME
TOPICAL | Status: DC | PRN
  Administered 2022-09-09: 200 mL

## 2022-09-09 MED ORDER — PROPOFOL 500 MG/50ML IV EMUL
INTRAVENOUS | Status: DC | PRN
  Administered 2022-09-09: 35 ug/kg/min via INTRAVENOUS

## 2022-09-09 MED ORDER — CEFAZOLIN SODIUM-DEXTROSE 2-4 GM/100ML-% IV SOLN
2.0000 g | INTRAVENOUS | Status: AC
  Administered 2022-09-09: 2 g via INTRAVENOUS

## 2022-09-09 MED ORDER — DEXAMETHASONE SODIUM PHOSPHATE 4 MG/ML IJ SOLN
INTRAMUSCULAR | Status: DC | PRN
  Administered 2022-09-09: 5 mg via INTRAVENOUS

## 2022-09-09 MED ORDER — CEFAZOLIN SODIUM-DEXTROSE 2-4 GM/100ML-% IV SOLN
INTRAVENOUS | Status: AC
  Filled 2022-09-09: qty 100

## 2022-09-09 MED ORDER — SUCCINYLCHOLINE CHLORIDE 200 MG/10ML IV SOSY
PREFILLED_SYRINGE | INTRAVENOUS | Status: DC | PRN
  Administered 2022-09-09: 120 mg via INTRAVENOUS

## 2022-09-09 MED ORDER — OXYCODONE HCL 5 MG PO TABS
ORAL_TABLET | ORAL | Status: AC
  Filled 2022-09-09: qty 1

## 2022-09-09 MED ORDER — PHENYLEPHRINE HCL-NACL 20-0.9 MG/250ML-% IV SOLN
INTRAVENOUS | Status: DC | PRN
  Administered 2022-09-09: 20 ug/min via INTRAVENOUS

## 2022-09-09 MED ORDER — PROPOFOL 10 MG/ML IV BOLUS
INTRAVENOUS | Status: DC | PRN
  Administered 2022-09-09: 50 mg via INTRAVENOUS
  Administered 2022-09-09: 150 mg via INTRAVENOUS

## 2022-09-09 MED ORDER — ACETAMINOPHEN 500 MG PO TABS
1000.0000 mg | ORAL_TABLET | Freq: Once | ORAL | Status: AC
  Administered 2022-09-09: 1000 mg via ORAL

## 2022-09-09 MED ORDER — FENTANYL CITRATE (PF) 100 MCG/2ML IJ SOLN
INTRAMUSCULAR | Status: AC
  Filled 2022-09-09: qty 2

## 2022-09-09 MED ORDER — FENTANYL CITRATE (PF) 100 MCG/2ML IJ SOLN
25.0000 ug | INTRAMUSCULAR | Status: DC | PRN
  Administered 2022-09-09: 25 ug via INTRAVENOUS

## 2022-09-09 SURGICAL SUPPLY — 69 items
ACC NRSTM 4 TRQ WRNCH STRL (MISCELLANEOUS)
ADH SKN CLS APL DERMABOND .7 (GAUZE/BANDAGES/DRESSINGS) ×2
BLADE CLIPPER SURG (BLADE) IMPLANT
BLADE SURG 15 STRL LF DISP TIS (BLADE) ×2 IMPLANT
BLADE SURG 15 STRL SS (BLADE) ×1
CANISTER SUCT 1200ML W/VALVE (MISCELLANEOUS) ×2 IMPLANT
CORD BIPOLAR FORCEPS 12FT (ELECTRODE) ×2 IMPLANT
COVER PROBE CYLINDRICAL 5X96 (MISCELLANEOUS) ×2 IMPLANT
DERMABOND ADVANCED .7 DNX12 (GAUZE/BANDAGES/DRESSINGS) ×4 IMPLANT
DRAPE C-ARM 35X43 STRL (DRAPES) ×2 IMPLANT
DRAPE HEAD BAR (DRAPES) IMPLANT
DRAPE INCISE IOBAN 66X45 STRL (DRAPES) ×2 IMPLANT
DRAPE MICROSCOPE WILD 40.5X102 (DRAPES) ×2 IMPLANT
DRAPE UTILITY XL STRL (DRAPES) ×2 IMPLANT
DRSG TEGADERM 2-3/8X2-3/4 SM (GAUZE/BANDAGES/DRESSINGS) ×4 IMPLANT
DRSG TEGADERM 4X4.75 (GAUZE/BANDAGES/DRESSINGS) IMPLANT
ELECT COATED BLADE 2.86 ST (ELECTRODE) ×2 IMPLANT
ELECT EMG 18 NIMS (NEUROSURGERY SUPPLIES) ×1
ELECT REM PT RETURN 9FT ADLT (ELECTROSURGICAL) ×1
ELECTRODE EMG 18 NIMS (NEUROSURGERY SUPPLIES) ×2 IMPLANT
ELECTRODE REM PT RTRN 9FT ADLT (ELECTROSURGICAL) ×2 IMPLANT
FORCEPS BIPOLAR SPETZLER 8 1.0 (NEUROSURGERY SUPPLIES) ×2 IMPLANT
GAUZE 4X4 16PLY ~~LOC~~+RFID DBL (SPONGE) ×2 IMPLANT
GAUZE SPONGE 4X4 12PLY STRL (GAUZE/BANDAGES/DRESSINGS) ×2 IMPLANT
GENERATOR PULSE INSPIRE (Generator) ×1 IMPLANT
GENERATOR PULSE INSPIRE IV (Generator) ×2 IMPLANT
GLOVE BIO SURGEON STRL SZ 6.5 (GLOVE) IMPLANT
GLOVE BIO SURGEON STRL SZ7.5 (GLOVE) ×2 IMPLANT
GLOVE BIOGEL PI IND STRL 7.0 (GLOVE) IMPLANT
GLOVE BIOGEL PI IND STRL 8 (GLOVE) IMPLANT
GLOVE SURG SS PI 6.5 STRL IVOR (GLOVE) IMPLANT
GLOVE SURG SS PI 7.0 STRL IVOR (GLOVE) IMPLANT
GOWN STRL REUS W/ TWL LRG LVL3 (GOWN DISPOSABLE) ×6 IMPLANT
GOWN STRL REUS W/TWL LRG LVL3 (GOWN DISPOSABLE) ×4
IV CATH 18G SAFETY (IV SOLUTION) ×2 IMPLANT
KIT NEURO ACCESSORY W/WRENCH (MISCELLANEOUS) IMPLANT
LEAD SENSING RESP INSPIRE (Lead) ×1 IMPLANT
LEAD SENSING RESP INSPIRE IV (Lead) ×2 IMPLANT
LEAD SLEEP STIM INSPIRE IV/V (Lead) ×2 IMPLANT
LEAD SLEEP STIMULATION INSPIRE (Lead) ×1 IMPLANT
LOOP VESSEL MAXI 1X406 BLUE (MISCELLANEOUS) ×1
LOOP VESSEL MAXI BLUE (MISCELLANEOUS) ×2 IMPLANT
LOOP VESSEL MINI RED (MISCELLANEOUS) ×2 IMPLANT
MARKER SKIN DUAL TIP RULER LAB (MISCELLANEOUS) ×2 IMPLANT
NDL HYPO 25X1 1.5 SAFETY (NEEDLE) ×2 IMPLANT
NEEDLE HYPO 25X1 1.5 SAFETY (NEEDLE) ×1 IMPLANT
NS IRRIG 1000ML POUR BTL (IV SOLUTION) ×2 IMPLANT
PACK BASIN DAY SURGERY FS (CUSTOM PROCEDURE TRAY) ×2 IMPLANT
PACK ENT DAY SURGERY (CUSTOM PROCEDURE TRAY) ×2 IMPLANT
PASSER CATH 36 CODMAN DISP (NEUROSURGERY SUPPLIES) IMPLANT
PASSER CATH 38CM DISP (INSTRUMENTS) IMPLANT
PENCIL SMOKE EVACUATOR (MISCELLANEOUS) ×2 IMPLANT
PROBE NERVE STIMULATOR (NEUROSURGERY SUPPLIES) ×2 IMPLANT
REMOTE CONTROL SLEEP INSPIRE (MISCELLANEOUS) ×2 IMPLANT
SET WALTER ACTIVATION W/DRAPE (SET/KITS/TRAYS/PACK) ×2 IMPLANT
SLEEVE SCD COMPRESS KNEE MED (STOCKING) ×2 IMPLANT
SPONGE INTESTINAL PEANUT (DISPOSABLE) ×2 IMPLANT
SUT SILK 2 0 SH (SUTURE) ×2 IMPLANT
SUT SILK 3 0 REEL (SUTURE) ×2 IMPLANT
SUT SILK 3 0 SH 30 (SUTURE) ×2 IMPLANT
SUT SILK 3-0 (SUTURE) ×2
SUT SILK 3-0 RB1 30XBRD (SUTURE) ×2
SUT VIC AB 3-0 SH 27 (SUTURE) ×1
SUT VIC AB 3-0 SH 27X BRD (SUTURE) ×2 IMPLANT
SUT VIC AB 4-0 PS2 27 (SUTURE) ×2 IMPLANT
SUTURE SILK 3-0 RB1 30XBRD (SUTURE) ×4 IMPLANT
SYR 10ML LL (SYRINGE) ×2 IMPLANT
SYR BULB EAR ULCER 3OZ GRN STR (SYRINGE) ×2 IMPLANT
TOWEL GREEN STERILE FF (TOWEL DISPOSABLE) ×4 IMPLANT

## 2022-09-09 NOTE — Anesthesia Procedure Notes (Signed)
Procedure Name: Intubation Date/Time: 09/09/2022 9:44 AM  Performed by: Brennan Bailey, MDPre-anesthesia Checklist: Patient identified, Emergency Drugs available, Suction available and Patient being monitored Patient Re-evaluated:Patient Re-evaluated prior to induction Oxygen Delivery Method: Circle System Utilized Preoxygenation: Pre-oxygenation with 100% oxygen Induction Type: IV induction Ventilation: Mask ventilation without difficulty and Oral airway inserted - appropriate to patient size Laryngoscope Size: Glidescope and 4 Tube type: Oral Tube size: 7.5 mm Number of attempts: 3 Airway Equipment and Method: Oral airway, Video-laryngoscopy and Rigid stylet Placement Confirmation: ETT inserted through vocal cords under direct vision, positive ETCO2 and breath sounds checked- equal and bilateral Secured at: 22 cm Tube secured with: Tape Dental Injury: Teeth and Oropharynx as per pre-operative assessment  Difficulty Due To: Difficult Airway- due to large tongue, Difficult Airway- due to anterior larynx and Difficulty was anticipated Comments: First attempt by CRNA with Glidescope #4, good view of glottis but unable to position ETT anterior enough to pass through cords. Second and third attempts by myself with Glidescope #4 blade, rigid stylet removed between attempts and additional bend made in stylet to be able to reach anterior glottic opening. Mask ventilated between 2nd and 3rd attempt. VSS. SpO2 >97% throughout. Johnny Huge, MD

## 2022-09-09 NOTE — Op Note (Signed)

## 2022-09-09 NOTE — Transfer of Care (Signed)
Immediate Anesthesia Transfer of Care Note  Patient: [name]  Procedure(s) Performed: IMPLANTATION OF HYPOGLOSSAL NERVE STIMULATOR (Right: Chest)  Patient Location: PACU  Anesthesia Type:General  Level of Consciousness: drowsy and patient cooperative  Airway & Oxygen Therapy: Patient Spontanous Breathing and Patient connected to face mask oxygen  Post-op Assessment: Report given to RN and Post -op Vital signs reviewed and stable  Post vital signs: Reviewed and stable  Last Vitals:  Vitals Value Taken Time  BP    Temp    Pulse    Resp    SpO2      Last Pain:  Vitals:   09/09/22 0809  TempSrc: Oral  PainSc: 0-No pain      Patients Stated Pain Goal: 3 (00/45/99 7741)  Complications:  Encounter Notable Events  Notable Event Outcome Phase Comment  Difficult to intubate - expected  Intraprocedure Filed from anesthesia note documentation.

## 2022-09-09 NOTE — H&P (Signed)
[name] is an 71 y.o. male.   Chief Complaint: Sleep apnea HPI: 71 year old male with obstructive sleep apnea who has been unable to tolerate CPAP.  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   Sleep apnea    uses Cpap nightly    Past Surgical History:  Procedure Laterality Date   BACK SURGERY     lumbar 2020   CATARACT EXTRACTION     10-15 years ago   COLONOSCOPY  05/25/2012   Procedure: COLONOSCOPY;  Surgeon: Rogene Houston, MD;  [facility];  Service: Endoscopy;  Laterality: N/A;  1030   COLONOSCOPY N/A 05/14/2017   Procedure: COLONOSCOPY;  Surgeon: Rogene Houston, MD;  [facility];  Service: Endoscopy;  Laterality: N/A;  10:10   CYSTOSCOPY/RETROGRADE/URETEROSCOPY Right 06/07/2014   Procedure: CYSTOSCOPY, RIGHT RETROGRADE, RIGHT URETEROSCOPY, RIGHT URETERAL BALLOON DILATATION, BIOPSY, RIGHT URETERAL STENT;  Surgeon: Jorja Loa, MD;  Location[facility];  Service: Urology;  Laterality: Right;   DRUG INDUCED ENDOSCOPY Right 07/21/2022   Procedure: DRUG INDUCED ENDOSCOPY;  Surgeon: Melida Quitter, MD;  Location: [facility];  Service: ENT;  Laterality: Right;   I & D EXTREMITY Left 05/01/2021   Procedure: LEFT INDEX AND LONG FINGER AMPUTATION AND RING FINGER NAIL BED REPAIR.;  Surgeon: Leanora Cover, MD;  Location: [facility];  Service: Orthopedics;  Laterality: Left;  LEFT INDEX AND LONG FINGER AMPUTATION AND RING FINGER NAIL BED REPAIR.   kidney stone  1983   Had partial right nephrectomy   KNEE ARTHROSCOPY WITH MEDIAL MENISECTOMY Right 04/14/2018   Procedure: KNEE ARTHROSCOPY WITH MEDIAL MENISECTOMY;  Surgeon: Carole Civil, MD;  Location: [facility];  Service:  Orthopedics;  Laterality: Right;   left foot surgery  2010   repair of joint   PARTIAL KNEE ARTHROPLASTY Left 09/16/2021   Procedure: UNICOMPARTMENTAL KNEE MEDIALLY;  Surgeon: Paralee Cancel, MD;  [facility];  Service: Orthopedics;  Laterality: Left;   WRIST SURGERY Bilateral     Family History  Problem Relation Age of Onset   Cancer Father        Bladder cancer   Lung cancer Brother    Healthy Mother    Healthy Brother    Healthy Brother    Social History:  reports that he has quit smoking. His smoking use included cigars. He has never been exposed to tobacco smoke. He has never used smokeless tobacco. He reports current alcohol use. He reports that he does not use drugs.  Allergies:  Allergies  Allergen Reactions   Nsaids Other (See Comments)    "Cannot take because of my kidney disease."    Medications Prior to Admission  Medication Sig Dispense Refill   DULoxetine (CYMBALTA) 30 MG capsule Take 30 mg by mouth 2 (two) times daily.     gabapentin (NEURONTIN) 300 MG capsule Take 1 capsule (300 mg total) by mouth 3 (three) times daily. 270 capsule 2   hydrochlorothiazide (MICROZIDE) 12.5 MG capsule Take 12.5 mg by mouth every morning.      pramipexole (MIRAPEX) 0.5 MG tablet TAKE 1 TABLET AT DINNER AND 1 TABLET AT BEDTIME. Must  keep follow up 12/09/21 for any future refills 180 tablet 3   QUEtiapine (SEROQUEL) 50 MG tablet Take 50 mg by mouth at bedtime.     traMADol (ULTRAM) 50 MG tablet Take 50 mg by mouth 2 (two) times daily as needed.      No results found for this or any previous visit (from the past 48 hour(s)). No results found.  Review of Systems  All other systems reviewed and are negative.   Blood pressure 133/71, pulse 63, temperature 97.9 F (36.6 C), temperature source Oral, resp. rate 18, height '5\' 8"'$  (1.727 m), weight 98.2 kg, SpO2 100 %. Physical Exam Constitutional:      Appearance: Normal appearance. He is normal weight.  HENT:     Head:  Normocephalic and atraumatic.     Right Ear: External ear normal.     Left Ear: External ear normal.     Nose: Nose normal.     Mouth/Throat:     Mouth: Mucous membranes are moist.     Pharynx: Oropharynx is clear.  Eyes:     Extraocular Movements: Extraocular movements intact.     Conjunctiva/sclera: Conjunctivae normal.     Pupils: Pupils are equal, round, and reactive to light.  Cardiovascular:     Rate and Rhythm: Normal rate.  Pulmonary:     Effort: Pulmonary effort is normal.  Musculoskeletal:     Cervical back: Normal range of motion.  Skin:    General: Skin is warm and dry.  Neurological:     General: No focal deficit present.     Mental Status: He is alert and oriented to person, place, and time.  Psychiatric:        Mood and Affect: Mood normal.        Behavior: Behavior normal.        Thought Content: Thought content normal.        Judgment: Judgment normal.      Assessment/Plan Obstructive sleep apnea, BMI 32.92  To OR for hypoglossal nerve stimulator placement.  Melida Quitter, MD 09/09/2022, 9:19 AM

## 2022-09-09 NOTE — Anesthesia Postprocedure Evaluation (Signed)
Anesthesia Post Note  Patient: [name]  Procedure(s) Performed: IMPLANTATION OF HYPOGLOSSAL NERVE STIMULATOR (Right: Chest)     Patient location during evaluation: PACU Anesthesia Type: General Level of consciousness: awake and alert Pain management: pain level controlled Vital Signs Assessment: post-procedure vital signs reviewed and stable Respiratory status: spontaneous breathing, nonlabored ventilation and respiratory function stable Cardiovascular status: blood pressure returned to baseline Postop Assessment: no apparent nausea or vomiting Anesthetic complications: yes   Encounter Notable Events  Notable Event Outcome Phase Comment  Difficult to intubate - expected  Intraprocedure Filed from anesthesia note documentation.    Last Vitals:  Vitals:   09/09/22 1227 09/09/22 1233  BP:    Pulse: 67 65  Resp: 19 16  Temp:    SpO2: 94% 96%    Last Pain:  Vitals:   09/09/22 1248  TempSrc:   PainSc: Todd

## 2022-09-09 NOTE — Brief Op Note (Signed)
09/09/2022  11:11 AM  PATIENT:  [name]  71 y.o. male  PRE-OPERATIVE DIAGNOSIS:  Obstructive Sleep Apnea;BMI 32.0-32.9,adult  POST-OPERATIVE DIAGNOSIS:  Obstructive Sleep Apnea;BMI 32.0-32.9,adult  PROCEDURE:  Procedure(s): IMPLANTATION OF HYPOGLOSSAL NERVE STIMULATOR (Right)  SURGEON:  Surgeon(s) and Role:    Melida Quitter, MD - Primary  PHYSICIAN ASSISTANT:   ASSISTANTS: none   ANESTHESIA:   general  EBL:  Minimal   BLOOD ADMINISTERED:none  DRAINS: none   LOCAL MEDICATIONS USED:  LIDOCAINE   SPECIMEN:  No Specimen  DISPOSITION OF SPECIMEN:  N/A  COUNTS:  YES  TOURNIQUET:  * No tourniquets in log *  DICTATION: .Note written in EPIC  PLAN OF CARE: Discharge to home after PACU  PATIENT DISPOSITION:  PACU - hemodynamically stable.   Delay start of Pharmacological VTE agent (>24hrs) due to surgical blood loss or risk of bleeding: no

## 2022-09-09 NOTE — Discharge Instructions (Signed)
May take Tylenol at 2pm.   Post Anesthesia Home Care Instructions  Activity: Get plenty of rest for the remainder of the day. A responsible individual must stay with you for 24 hours following the procedure.  For the next 24 hours, DO NOT: -Drive a car -Paediatric nurse -Drink alcoholic beverages -Take any medication unless instructed by your physician -Make any legal decisions or sign important papers.  Meals: Start with liquid foods such as gelatin or soup. Progress to regular foods as tolerated. Avoid greasy, spicy, heavy foods. If nausea and/or vomiting occur, drink only clear liquids until the nausea and/or vomiting subsides. Call your physician if vomiting continues.  Special Instructions/Symptoms: Your throat may feel dry or sore from the anesthesia or the breathing tube placed in your throat during surgery. If this causes discomfort, gargle with warm salt water. The discomfort should disappear within 24 hours.  If you had a scopolamine patch placed behind your ear for the management of post- operative nausea and/or vomiting:  1. The medication in the patch is effective for 72 hours, after which it should be removed.  Wrap patch in a tissue and discard in the trash. Wash hands thoroughly with soap and water. 2. You may remove the patch earlier than 72 hours if you experience unpleasant side effects which may include dry mouth, dizziness or visual disturbances. 3. Avoid touching the patch. Wash your hands with soap and water after contact with the patch.

## 2022-09-09 NOTE — Progress Notes (Signed)
Notified provider Redmond Baseman about CXR results, no pneumothorax noted. Provider cleared patient for discharge.

## 2022-09-10 ENCOUNTER — Encounter (HOSPITAL_BASED_OUTPATIENT_CLINIC_OR_DEPARTMENT_OTHER): Payer: Self-pay | Admitting: Otolaryngology

## 2022-09-30 ENCOUNTER — Telehealth: Payer: Self-pay | Admitting: Pulmonary Disease

## 2022-10-05 NOTE — Telephone Encounter (Signed)
Okay to defer starting Inspire device until he returns from his cruise.

## 2022-10-12 NOTE — Telephone Encounter (Signed)
ATC patient. Left a vm with message from Dr. Halford Chessman. Nothing further needed

## 2022-10-23 ENCOUNTER — Ambulatory Visit (HOSPITAL_BASED_OUTPATIENT_CLINIC_OR_DEPARTMENT_OTHER): Payer: Medicare Other | Admitting: Pulmonary Disease

## 2022-10-23 DIAGNOSIS — M25561 Pain in right knee: Secondary | ICD-10-CM | POA: Diagnosis not present

## 2022-10-24 DIAGNOSIS — Z23 Encounter for immunization: Secondary | ICD-10-CM | POA: Diagnosis not present

## 2022-10-26 HISTORY — PX: REPOSITION OF LENS: SHX6069

## 2022-11-09 DIAGNOSIS — T8522XA Displacement of intraocular lens, initial encounter: Secondary | ICD-10-CM | POA: Diagnosis not present

## 2022-11-11 DIAGNOSIS — T8529XA Other mechanical complication of intraocular lens, initial encounter: Secondary | ICD-10-CM | POA: Diagnosis not present

## 2022-11-11 DIAGNOSIS — Z125 Encounter for screening for malignant neoplasm of prostate: Secondary | ICD-10-CM | POA: Diagnosis not present

## 2022-11-11 DIAGNOSIS — E782 Mixed hyperlipidemia: Secondary | ICD-10-CM | POA: Diagnosis not present

## 2022-11-11 DIAGNOSIS — Z961 Presence of intraocular lens: Secondary | ICD-10-CM | POA: Diagnosis not present

## 2022-11-11 DIAGNOSIS — H47012 Ischemic optic neuropathy, left eye: Secondary | ICD-10-CM | POA: Diagnosis not present

## 2022-11-12 DIAGNOSIS — H35033 Hypertensive retinopathy, bilateral: Secondary | ICD-10-CM | POA: Diagnosis not present

## 2022-11-12 DIAGNOSIS — H35373 Puckering of macula, bilateral: Secondary | ICD-10-CM | POA: Diagnosis not present

## 2022-11-12 DIAGNOSIS — H47012 Ischemic optic neuropathy, left eye: Secondary | ICD-10-CM | POA: Diagnosis not present

## 2022-11-12 DIAGNOSIS — H43813 Vitreous degeneration, bilateral: Secondary | ICD-10-CM | POA: Diagnosis not present

## 2022-11-12 DIAGNOSIS — T8522XA Displacement of intraocular lens, initial encounter: Secondary | ICD-10-CM | POA: Diagnosis not present

## 2022-11-16 DIAGNOSIS — S68119S 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, sequela: Secondary | ICD-10-CM | POA: Diagnosis not present

## 2022-11-16 DIAGNOSIS — M1711 Unilateral primary osteoarthritis, right knee: Secondary | ICD-10-CM | POA: Diagnosis not present

## 2022-11-16 DIAGNOSIS — E782 Mixed hyperlipidemia: Secondary | ICD-10-CM | POA: Diagnosis not present

## 2022-11-16 DIAGNOSIS — D638 Anemia in other chronic diseases classified elsewhere: Secondary | ICD-10-CM | POA: Diagnosis not present

## 2022-11-16 DIAGNOSIS — Z0001 Encounter for general adult medical examination with abnormal findings: Secondary | ICD-10-CM | POA: Diagnosis not present

## 2022-11-16 DIAGNOSIS — G2581 Restless legs syndrome: Secondary | ICD-10-CM | POA: Diagnosis not present

## 2022-11-16 DIAGNOSIS — G720 Drug-induced myopathy: Secondary | ICD-10-CM | POA: Diagnosis not present

## 2022-11-16 DIAGNOSIS — G4700 Insomnia, unspecified: Secondary | ICD-10-CM | POA: Diagnosis not present

## 2022-11-16 DIAGNOSIS — G4733 Obstructive sleep apnea (adult) (pediatric): Secondary | ICD-10-CM | POA: Diagnosis not present

## 2022-11-16 DIAGNOSIS — N1831 Chronic kidney disease, stage 3a: Secondary | ICD-10-CM | POA: Diagnosis not present

## 2022-11-16 DIAGNOSIS — I129 Hypertensive chronic kidney disease with stage 1 through stage 4 chronic kidney disease, or unspecified chronic kidney disease: Secondary | ICD-10-CM | POA: Diagnosis not present

## 2022-11-16 DIAGNOSIS — J069 Acute upper respiratory infection, unspecified: Secondary | ICD-10-CM | POA: Diagnosis not present

## 2022-11-17 ENCOUNTER — Ambulatory Visit (HOSPITAL_BASED_OUTPATIENT_CLINIC_OR_DEPARTMENT_OTHER): Payer: Medicare Other | Admitting: Pulmonary Disease

## 2022-11-18 ENCOUNTER — Ambulatory Visit (HOSPITAL_BASED_OUTPATIENT_CLINIC_OR_DEPARTMENT_OTHER): Payer: Medicare Other | Admitting: Pulmonary Disease

## 2022-11-19 DIAGNOSIS — H35371 Puckering of macula, right eye: Secondary | ICD-10-CM | POA: Diagnosis not present

## 2022-11-19 DIAGNOSIS — T8522XA Displacement of intraocular lens, initial encounter: Secondary | ICD-10-CM | POA: Diagnosis not present

## 2022-11-19 DIAGNOSIS — H35031 Hypertensive retinopathy, right eye: Secondary | ICD-10-CM | POA: Diagnosis not present

## 2022-11-19 DIAGNOSIS — H43811 Vitreous degeneration, right eye: Secondary | ICD-10-CM | POA: Diagnosis not present

## 2022-11-26 ENCOUNTER — Ambulatory Visit (INDEPENDENT_AMBULATORY_CARE_PROVIDER_SITE_OTHER): Payer: Medicare Other | Admitting: Adult Health

## 2022-11-26 ENCOUNTER — Encounter: Payer: Self-pay | Admitting: Adult Health

## 2022-11-26 VITALS — BP 110/70 | HR 80 | Temp 98.7°F | Ht 68.0 in | Wt 217.2 lb

## 2022-11-26 DIAGNOSIS — G4733 Obstructive sleep apnea (adult) (pediatric): Secondary | ICD-10-CM | POA: Diagnosis not present

## 2022-11-26 NOTE — Assessment & Plan Note (Addendum)
Obstructive sleep apnea with CPAP intolerance.  Patient has underwent inspire implantation completed on September 09, 2022.  Inspire device/remote activation was completed today.  Patient education was given.  Incision sites are well-healed. Stimulation level sensation is 0.4 V, functional level 1 v Start delay 30 minutes, pause time 15 minutes, duration 8hr  Patient demonstrated Compazine with remote and app.  Patient is instructed to use his inspire device all night long.  He will begin titration as directed each week.  Will return here in 4 weeks for follow-up.  Plan  Patient Instructions  Begin Inspire At bedtime , titrate as discussed, Use all night long while sleeping.  Healthy sleep regimen  Follow up in 1 month and As needed

## 2022-11-26 NOTE — Patient Instructions (Signed)
Begin Inspire At bedtime , titrate as discussed, Use all night long while sleeping.  Healthy sleep regimen  Follow up in 1 month and As needed

## 2022-11-26 NOTE — Progress Notes (Signed)
$'@Patient'u$  ID: [name], male    DOB: [birth year], 72 y.o.   MRN: [id number]  Chief Complaint  Patient presents with   Follow-up    Referring provider: Celene Squibb, MD  HPI: 72 year old male seen for sleep consult March 26, 2022 to establish for longstanding sleep apnea on CPAP. Patient was interested in the inspire device and was referred to ENT, inspire implantation on September 09, 2022 by Dr. Redmond Baseman  TEST/EVENTS :   11/26/2022 Follow up ; OSA, Inspire device activation  Patient presents for a follow-up visit.  Patient was last seen in June 2023.  Patient had longstanding sleep apnea.  Has been on CPAP for years.  Patient has not liked being on CPAP.  And was very interested in the inspire device.  He was having difficulty tolerating his CPAP.  Patient was referred to ENT.  He was seen by Dr. Redmond Baseman.  I went through a evaluation for hypoglossal nerve stimulator.  This inspire device was placed on September 09, 2022.  Patient says that he had quite a bit of bruising and swelling after surgery but that has slowly went away completely.  Surgical sites have completely healed.  And patient has been seen by ENT in the outpatient setting.  Patient is very eager to get started.  He does continue to use his CPAP at bedtime but wants to get off of this as soon as possible. Patient denies any significant tongue pain.  Says he does feel a very slight pull along the right side of his tongue but considers it very mild.  Inspire team is here today for activation phase. Stimulation level sensation is 0.4 V, functional level 1 v, stimulation waveform appears to be normal.  And comfortable per patient.  Remote activation and patient education was completed  Allergies  Allergen Reactions   Nsaids Other (See Comments)    "Cannot take because of my kidney disease."    Immunization History  Administered Date(s) Administered   Influenza, High Dose Seasonal PF 11/11/2021   Influenza,inj,quad, With  Preservative 07/26/2020   Influenza-Unspecified 08/26/2018   Tdap 05/01/2021   Zoster Recombinat (Shingrix) 02/24/2018, 05/23/2018    Past Medical History:  Diagnosis Date   Anemia    Anxiety    Arthritis    spinal stenosis   Depression    History of kidney stones    Hyperlipidemia    Hypertension    Kidney function abnormal 1983   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   Sleep apnea    uses Cpap nightly    Tobacco History: Social History   Tobacco Use  Smoking Status Former   Types: Cigars   Passive exposure: Never  Smokeless Tobacco Never   Counseling given: Not Answered   Outpatient Medications Prior to Visit  Medication Sig Dispense Refill   DULoxetine (CYMBALTA) 30 MG capsule Take 30 mg by mouth 2 (two) times daily.     gabapentin (NEURONTIN) 300 MG capsule Take 1 capsule (300 mg total) by mouth 3 (three) times daily. 270 capsule 2   hydrochlorothiazide (MICROZIDE) 12.5 MG capsule Take 12.5 mg by mouth every morning.      HYDROcodone-acetaminophen (NORCO) 5-325 MG tablet Take 1-2 tablets by mouth every 6 (six) hours as needed for moderate pain. 12 tablet 0   pramipexole (MIRAPEX) 0.5 MG tablet TAKE 1 TABLET AT DINNER AND 1 TABLET AT BEDTIME. Must keep follow up 12/09/21 for any future refills 180 tablet  3   QUEtiapine (SEROQUEL) 50 MG tablet Take 50 mg by mouth at bedtime.     traMADol (ULTRAM) 50 MG tablet Take 50 mg by mouth 2 (two) times daily as needed.     No facility-administered medications prior to visit.     Review of Systems:   Constitutional:   No  weight loss, night sweats,  Fevers, chills, fatigue, or  lassitude.  HEENT:   No headaches,  Difficulty swallowing,  Tooth/dental problems, or  Sore throat,                No sneezing, itching, ear ache, nasal congestion, post nasal drip,   CV:  No chest pain,  Orthopnea, PND, swelling in lower extremities, anasarca, dizziness, palpitations, syncope.    GI  No heartburn, indigestion, abdominal pain, nausea, vomiting, diarrhea, change in bowel habits, loss of appetite, bloody stools.   Resp: No shortness of breath with exertion or at rest.  No excess mucus, no productive cough,  No non-productive cough,  No coughing up of blood.  No change in color of mucus.  No wheezing.  No chest wall deformity  Skin: no rash or lesions.  GU: no dysuria, change in color of urine, no urgency or frequency.  No flank pain, no hematuria   MS:  No joint pain or swelling.  No decreased range of motion.  No back pain.    Physical Exam  BP 110/70 (BP Location: Left Arm, Patient Position: Sitting, Cuff Size: Normal)   Pulse 80   Temp 98.7 F (37.1 C) (Oral)   Ht '5\' 8"'$  (1.727 m)   Wt 217 lb 3.2 oz (98.5 kg)   SpO2 97%   BMI 33.03 kg/m   GEN: A/Ox3; pleasant , NAD, well nourished    HEENT:  [place]/AT,   NOSE-clear, THROAT-clear, no lesions, no postnasal drip or exudate noted.  Tongue is midline, with normal movement.  NECK:  Supple w/ fair ROM; no JVD; normal carotid impulses w/o bruits; no thyromegaly or nodules palpated; no lymphadenopathy.    RESP  Clear  P & A; w/o, wheezes/ rales/ or rhonchi. no accessory muscle use, no dullness to percussion  CARD:  RRR, no m/r/g, no peripheral edema, pulses intact, no cyanosis or clubbing.  GI:   Soft & nt; nml bowel sounds; no organomegaly or masses detected.   Musco: Warm bil, no deformities or joint swelling noted.   Neuro: alert, no focal deficits noted.    Skin: Warm, no lesions or rashes, right chest wall incision site appears well-healed with no redness , right submandibular incision site well-healed with no redness    Lab Results:     BNP No results found for: "BNP"  ProBNP No results found for: "PROBNP"  Imaging: No results found.        No data to display          No results found for: "NITRICOXIDE"      Assessment & Plan:   OSA (obstructive sleep apnea) Obstructive  sleep apnea with CPAP intolerance.  Patient has underwent inspire implantation completed on September 09, 2022.  Inspire device/remote activation was completed today.  Patient education was given.  Incision sites are well-healed. Stimulation level sensation is 0.4 V, functional level 1 v Start delay 30 minutes, pause time 15 minutes, duration 8hr  Patient demonstrated Compazine with remote and app.  Patient is instructed to use his inspire device all night long.  He will begin titration as directed each week.  Will return here in 4 weeks for follow-up.  Plan  Patient Instructions  Begin Inspire At bedtime , titrate as discussed, Use all night long while sleeping.  Healthy sleep regimen  Follow up in 1 month and As needed        I spent  35  minutes dedicated to the care of this patient on the date of this encounter to include pre-visit review of records, face-to-face time with the patient discussing conditions above, post visit ordering of testing, clinical documentation with the electronic health record, making appropriate referrals as documented, and communicating necessary findings to members of the patients care team.    Rexene Edison, NP 11/26/2022

## 2022-11-27 NOTE — Progress Notes (Signed)
Reviewed and agree with assessment/plan.   Chesley Mires, MD [facility] Pulmonary/Critical Care 11/27/2022, 8:43 AM Pager:  [phone]

## 2022-12-01 ENCOUNTER — Encounter: Payer: Self-pay | Admitting: Adult Health

## 2022-12-02 DIAGNOSIS — T8522XA Displacement of intraocular lens, initial encounter: Secondary | ICD-10-CM | POA: Diagnosis not present

## 2022-12-10 ENCOUNTER — Other Ambulatory Visit: Payer: Self-pay | Admitting: Neurology

## 2022-12-10 NOTE — Telephone Encounter (Signed)
Last seen on 12/09/2021 No follow up scheduled.

## 2022-12-14 DIAGNOSIS — N200 Calculus of kidney: Secondary | ICD-10-CM | POA: Diagnosis not present

## 2022-12-14 DIAGNOSIS — E6609 Other obesity due to excess calories: Secondary | ICD-10-CM | POA: Diagnosis not present

## 2022-12-14 DIAGNOSIS — I129 Hypertensive chronic kidney disease with stage 1 through stage 4 chronic kidney disease, or unspecified chronic kidney disease: Secondary | ICD-10-CM | POA: Diagnosis not present

## 2022-12-14 DIAGNOSIS — N1831 Chronic kidney disease, stage 3a: Secondary | ICD-10-CM | POA: Diagnosis not present

## 2022-12-14 DIAGNOSIS — D638 Anemia in other chronic diseases classified elsewhere: Secondary | ICD-10-CM | POA: Diagnosis not present

## 2022-12-16 DIAGNOSIS — N1831 Chronic kidney disease, stage 3a: Secondary | ICD-10-CM | POA: Diagnosis not present

## 2022-12-16 DIAGNOSIS — D638 Anemia in other chronic diseases classified elsewhere: Secondary | ICD-10-CM | POA: Diagnosis not present

## 2022-12-16 DIAGNOSIS — G4733 Obstructive sleep apnea (adult) (pediatric): Secondary | ICD-10-CM | POA: Diagnosis not present

## 2022-12-16 DIAGNOSIS — E6609 Other obesity due to excess calories: Secondary | ICD-10-CM | POA: Diagnosis not present

## 2022-12-16 DIAGNOSIS — I129 Hypertensive chronic kidney disease with stage 1 through stage 4 chronic kidney disease, or unspecified chronic kidney disease: Secondary | ICD-10-CM | POA: Diagnosis not present

## 2022-12-16 DIAGNOSIS — N200 Calculus of kidney: Secondary | ICD-10-CM | POA: Diagnosis not present

## 2022-12-16 DIAGNOSIS — E875 Hyperkalemia: Secondary | ICD-10-CM | POA: Diagnosis not present

## 2022-12-25 ENCOUNTER — Encounter: Payer: Self-pay | Admitting: Adult Health

## 2022-12-25 ENCOUNTER — Ambulatory Visit (INDEPENDENT_AMBULATORY_CARE_PROVIDER_SITE_OTHER): Payer: Medicare Other | Admitting: Adult Health

## 2022-12-25 VITALS — BP 128/80 | HR 99 | Temp 98.5°F | Ht 68.0 in | Wt 211.2 lb

## 2022-12-25 DIAGNOSIS — G4733 Obstructive sleep apnea (adult) (pediatric): Secondary | ICD-10-CM | POA: Diagnosis not present

## 2022-12-25 NOTE — Patient Instructions (Addendum)
Continue on Inspire At bedtime , titrate as discussed, Use all night long while sleeping.  Set up Inspire Titration study in 8 weeks .  Healthy sleep regimen  Follow up in 6 weeks and As needed

## 2022-12-25 NOTE — Progress Notes (Signed)
$'@Patient'K$  ID: [name], male    DOB: [birth year], 72 y.o.   MRN: [id number]  Chief Complaint  Patient presents with   Follow-up    Referring provider: Celene Squibb, MD  HPI: 72 yo male seen for sleep consult March 26, 2022 to establish for long standing sleep apnea on CPAP-unable to tolerate CPAP.  Underwent inspire implantation on September 09, 2022 by Dr. Redmond Baseman  TEST/EVENTS :   12/25/2022 Follow up :OSA -Inspire  Patient returns for a 1 month follow-up.  Patient is status post inspire implantation on September 09, 2022.  1 month ago he began his inspire activation.  Patient says he has been doing fairly well after his activation.  Snoring has decreased.  He still has some teeth clenching.  Does not feel as tired.  Has completely stopped using his CPAP machine.  He did have some confusion with the remote control and hit the pause button and increased his voltage up to levels and set at 1.  Patient education was given today in the office.  Inspire team is present during patient's visit..  Compliance report shows excellent usage with 100% usage.  Daily average usage at 8 hours.  Patient's voltage range is 1-2.   Simulation and waveform was completed today.  Patient is currently on 1.4 V.  Patient says he has been seeing the ophthalmologist has recently had eye surgery that is been a little stressful.  Allergies  Allergen Reactions   Nsaids Other (See Comments)    "Cannot take because of my kidney disease."    Immunization History  Administered Date(s) Administered   Fluad Quad(high Dose 65+) 10/02/2022   Influenza, High Dose Seasonal PF 11/11/2021   Influenza,inj,quad, With Preservative 07/26/2020   Influenza-Unspecified 08/26/2018   Tdap 05/01/2021   Zoster Recombinat (Shingrix) 02/24/2018, 05/23/2018    Past Medical History:  Diagnosis Date   Anemia    Anxiety    Arthritis    spinal stenosis   Depression    History of kidney stones    Hyperlipidemia    Hypertension     Kidney function abnormal 1983   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   Sleep apnea    uses Cpap nightly    Tobacco History: Social History   Tobacco Use  Smoking Status Former   Types: Cigars   Passive exposure: Never  Smokeless Tobacco Never   Counseling given: Not Answered   Outpatient Medications Prior to Visit  Medication Sig Dispense Refill   DULoxetine (CYMBALTA) 30 MG capsule Take 30 mg by mouth 2 (two) times daily.     gabapentin (NEURONTIN) 300 MG capsule TAKE 1 CAPSULE BY MOUTH THREE TIMES DAILY. 270 capsule 0   hydrochlorothiazide (MICROZIDE) 12.5 MG capsule Take 12.5 mg by mouth every morning.      pramipexole (MIRAPEX) 0.5 MG tablet TAKE 1 TABLET AT DINNER AND 1 TABLET AT BEDTIME. Must keep follow up 12/09/21 for any future refills 180 tablet 3   QUEtiapine (SEROQUEL) 50 MG tablet Take 50 mg by mouth at bedtime.     traMADol (ULTRAM) 50 MG tablet Take 50 mg by mouth 2 (two) times daily as needed.     HYDROcodone-acetaminophen (NORCO) 5-325 MG tablet Take 1-2 tablets by mouth every 6 (six) hours as needed for moderate pain. (Patient not taking: Reported on 12/25/2022) 12 tablet 0   No facility-administered medications prior to visit.     Review of Systems:  Constitutional:   No  weight loss, night sweats,  Fevers, chills, fatigue, or  lassitude.  HEENT:   No headaches,  Difficulty swallowing,  Tooth/dental problems, or  Sore throat,                No sneezing, itching, ear ache, nasal congestion, post nasal drip,   CV:  No chest pain,  Orthopnea, PND, swelling in lower extremities, anasarca, dizziness, palpitations, syncope.   GI  No heartburn, indigestion, abdominal pain, nausea, vomiting, diarrhea, change in bowel habits, loss of appetite, bloody stools.   Resp: No shortness of breath with exertion or at rest.  No excess mucus, no productive cough,  No non-productive cough,  No coughing up of blood.  No change  in color of mucus.  No wheezing.  No chest wall deformity  Skin: no rash or lesions.  GU: no dysuria, change in color of urine, no urgency or frequency.  No flank pain, no hematuria   MS:  No joint pain or swelling.  No decreased range of motion.  No back pain.    Physical Exam  BP 128/80 (BP Location: Left Arm, Patient Position: Sitting, Cuff Size: Normal)   Pulse 99   Temp 98.5 F (36.9 C) (Oral)   Ht '5\' 8"'$  (1.727 m)   Wt 211 lb 3.2 oz (95.8 kg)   SpO2 98%   BMI 32.11 kg/m   GEN: A/Ox3; pleasant , NAD, well nourished    HEENT:  [place]/AT,  EACs-clear, TMs-wnl, NOSE-clear, THROAT-clear, no lesions, no postnasal drip or exudate noted.   NECK:  Supple w/ fair ROM; no JVD; normal carotid impulses w/o bruits; no thyromegaly or nodules palpated; no lymphadenopathy.    RESP  Clear  P & A; w/o, wheezes/ rales/ or rhonchi. no accessory muscle use, no dullness to percussion  CARD:  RRR, no m/r/g, no peripheral edema, pulses intact, no cyanosis or clubbing.  GI:   Soft & nt; nml bowel sounds; no organomegaly or masses detected.   Musco: Warm bil, no deformities or joint swelling noted.   Neuro: alert, no focal deficits noted.    Skin: Warm, no lesions or rashes    Lab Results:  CBC    Component Value Date/Time   WBC 6.9 09/05/2021 1122   RBC 4.54 09/05/2021 1122   HGB 13.7 09/05/2021 1122   HCT 40.9 09/05/2021 1122   PLT 215 09/05/2021 1122   MCV 90.1 09/05/2021 1122   MCH 30.2 09/05/2021 1122   MCHC 33.5 09/05/2021 1122   RDW 12.9 09/05/2021 1122   LYMPHSABS 2.4 05/01/2021 1635   MONOABS 0.9 05/01/2021 1635   EOSABS 0.2 05/01/2021 1635   BASOSABS 0.1 05/01/2021 1635    BMET    Component Value Date/Time   NA 140 09/03/2022 1248   NA 145 (H) 11/27/2019 1002   K 4.6 09/03/2022 1248   CL 105 09/03/2022 1248   CO2 28 09/03/2022 1248   GLUCOSE 101 (H) 09/03/2022 1248   BUN 22 09/03/2022 1248   BUN 16 11/27/2019 1002   CREATININE 1.50 (H) 09/03/2022 1248    CALCIUM 9.5 09/03/2022 1248   GFRNONAA 49 (L) 09/03/2022 1248   GFRAA 53 (L) 11/27/2019 1002    BNP No results found for: "BNP"  ProBNP No results found for: "PROBNP"  Imaging: No results found.        No data to display          No results found for: "NITRICOXIDE"  Assessment & Plan:   OSA (obstructive sleep apnea) Obstructive sleep apnea with CPAP intolerance.  Status post inspire implantation on September 09, 2022.  Patient seems to be doing well.  Patient education on his remote control today.  Patient is currently on 1.4 V.  Will continue with titration as tolerated.  Patient education was given.  Patient will return here in 6 weeks.  He will be set up for an inspire titration study in 8 weeks.  Plan  Patient Instructions  Continue on Inspire At bedtime , titrate as discussed, Use all night long while sleeping.  Set up Inspire Titration study in 8 weeks .  Healthy sleep regimen  Follow up in 6 weeks and As needed       I spent  30 minutes dedicated to the care of this patient on the date of this encounter to include pre-visit review of records, face-to-face time with the patient discussing conditions above, post visit ordering of testing, clinical documentation with the electronic health record, making appropriate referrals as documented, and communicating necessary findings to members of the patients care team.   Rexene Edison, NP 12/25/2022

## 2022-12-25 NOTE — Assessment & Plan Note (Signed)
Obstructive sleep apnea with CPAP intolerance.  Status post inspire implantation on September 09, 2022.  Patient seems to be doing well.  Patient education on his remote control today.  Patient is currently on 1.4 V.  Will continue with titration as tolerated.  Patient education was given.  Patient will return here in 6 weeks.  He will be set up for an inspire titration study in 8 weeks.  Plan  Patient Instructions  Continue on Inspire At bedtime , titrate as discussed, Use all night long while sleeping.  Set up Inspire Titration study in 8 weeks .  Healthy sleep regimen  Follow up in 6 weeks and As needed

## 2022-12-25 NOTE — Progress Notes (Signed)
Received first 2 [place] and first booster.

## 2022-12-26 NOTE — Progress Notes (Signed)
Reviewed and agree with assessment/plan.   Chesley Mires, MD [facility] Pulmonary/Critical Care 12/26/2022, 1:15 PM Pager:  [phone]

## 2022-12-29 DIAGNOSIS — N1831 Chronic kidney disease, stage 3a: Secondary | ICD-10-CM | POA: Diagnosis not present

## 2022-12-29 DIAGNOSIS — G4733 Obstructive sleep apnea (adult) (pediatric): Secondary | ICD-10-CM | POA: Diagnosis not present

## 2022-12-29 DIAGNOSIS — E6609 Other obesity due to excess calories: Secondary | ICD-10-CM | POA: Diagnosis not present

## 2022-12-29 DIAGNOSIS — E875 Hyperkalemia: Secondary | ICD-10-CM | POA: Diagnosis not present

## 2022-12-29 DIAGNOSIS — N200 Calculus of kidney: Secondary | ICD-10-CM | POA: Diagnosis not present

## 2022-12-29 DIAGNOSIS — I129 Hypertensive chronic kidney disease with stage 1 through stage 4 chronic kidney disease, or unspecified chronic kidney disease: Secondary | ICD-10-CM | POA: Diagnosis not present

## 2022-12-29 DIAGNOSIS — D638 Anemia in other chronic diseases classified elsewhere: Secondary | ICD-10-CM | POA: Diagnosis not present

## 2023-01-06 ENCOUNTER — Encounter: Payer: Self-pay | Admitting: Adult Health

## 2023-02-05 ENCOUNTER — Ambulatory Visit: Payer: Medicare Other | Admitting: Adult Health

## 2023-02-10 ENCOUNTER — Other Ambulatory Visit: Payer: Self-pay | Admitting: Adult Health

## 2023-02-10 ENCOUNTER — Encounter: Payer: Self-pay | Admitting: Adult Health

## 2023-02-10 ENCOUNTER — Ambulatory Visit (INDEPENDENT_AMBULATORY_CARE_PROVIDER_SITE_OTHER): Payer: Medicare Other | Admitting: Adult Health

## 2023-02-10 VITALS — BP 128/80 | HR 86 | Temp 98.2°F | Ht 68.0 in | Wt 213.4 lb

## 2023-02-10 DIAGNOSIS — G4733 Obstructive sleep apnea (adult) (pediatric): Secondary | ICD-10-CM

## 2023-02-10 DIAGNOSIS — G4700 Insomnia, unspecified: Secondary | ICD-10-CM

## 2023-02-10 DIAGNOSIS — G2581 Restless legs syndrome: Secondary | ICD-10-CM | POA: Diagnosis not present

## 2023-02-10 NOTE — Progress Notes (Signed)
  ID: [name], male    DOB: 1951/05/04, 72 y.o.   MRN: [id number]  Chief Complaint  Patient presents with   Follow-up    Referring provider: Benita Stabile, MD  HPI: 72 year old male seen for sleep consult March 26, 2022 to establish for longstanding sleep apnea on CPAP.  Patient did not tolerate CPAP.  Underwent inspire implantation on September 09, 2022 by Dr. Jenne Pane. Medical history significant for restless leg syndrome  TEST/EVENTS :  Split-night sleep study April 06, 2022 severe obstructive sleep apnea AHI 39.5/SpO2 low 85%, optimal control CPAP 7 -10 cm H2O.  Drug-induced sleep endoscopy July 21, 2022, ENT Dr. Jenne Pane (anterior posterior collapse)  Hypoglossal nerve stimulator implantation September 09, 2022, ENT Dr. Genevie Cheshire device activation November 26, 2022  02/10/2023 Follow up : OSA , Inspire  Patient presents for a 6-week follow-up.  Patient has underlying longstanding sleep apnea.  Has become CPAP intolerant.  He was underwent inspire device evaluation with implantation September 09, 2022.  Device activation was on November 26, 2022.  Patient says since last visit he is doing okay with his device.  He does feel like the strength is very strong at night and is hard to go back to sleep.  He does have perceived benefit with decreased snoring and daytime sleepiness with usage.  Compliance download shows excellent compliance with 97% usage.  Daily average usage around 6 hours.  Again patient has some confusion with remote control regarding pause and titration.  Voltage level went all the way up to 1.9.  Again we went over remote usage pulse feature with patient education today.  We increased pulse time to 20 minutes.  Lowered his voltage to 1.4 V.  Tongue movement was normal.  Lower voltage limit 1.0 upper limit 2.0 V.  Start delay is still at 30 minutes.  Duration is at 8 hours.  Waveform was normal. We discussed setting up an inspire titration study.   Allergies   Allergen Reactions   Nsaids Other (See Comments)    "Cannot take because of my kidney disease."    Immunization History  Administered Date(s) Administered   Fluad Quad(high Dose 65+) 10/02/2022   Influenza, High Dose Seasonal PF 11/11/2021   Influenza,inj,quad, With Preservative 07/26/2020   Influenza-Unspecified 08/26/2018   Tdap 05/01/2021   Zoster Recombinat (Shingrix) 02/24/2018, 05/23/2018    Past Medical History:  Diagnosis Date   Anemia    Anxiety    Arthritis    spinal stenosis   Depression    History of kidney stones    Hyperlipidemia    Hypertension    Kidney function abnormal 1983   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   Sleep apnea    uses Cpap nightly    Tobacco History: Social History   Tobacco Use  Smoking Status Former   Types: Cigars   Passive exposure: Never  Smokeless Tobacco Never   Counseling given: Not Answered   Outpatient Medications Prior to Visit  Medication Sig Dispense Refill   DULoxetine (CYMBALTA) 30 MG capsule Take 30 mg by mouth 2 (two) times daily.     gabapentin (NEURONTIN) 300 MG capsule TAKE 1 CAPSULE BY MOUTH THREE TIMES DAILY. 270 capsule 0   hydrochlorothiazide (MICROZIDE) 12.5 MG capsule Take 12.5 mg by mouth every morning.      pramipexole (MIRAPEX) 0.5 MG tablet TAKE 1 TABLET AT DINNER AND 1 TABLET AT BEDTIME. Must keep follow up 12/09/21  for any future refills 180 tablet 3   QUEtiapine (SEROQUEL) 50 MG tablet Take 50 mg by mouth at bedtime.     traMADol (ULTRAM) 50 MG tablet Take 50 mg by mouth 2 (two) times daily as needed.     No facility-administered medications prior to visit.     Review of Systems:   Constitutional:   No  weight loss, night sweats,  Fevers, chills, fatigue, or  lassitude.  HEENT:   No headaches,  Difficulty swallowing,  Tooth/dental problems, or  Sore throat,                No sneezing, itching, ear ache, nasal congestion, post nasal drip,    CV:  No chest pain,  Orthopnea, PND, swelling in lower extremities, anasarca, dizziness, palpitations, syncope.   GI  No heartburn, indigestion, abdominal pain, nausea, vomiting, diarrhea, change in bowel habits, loss of appetite, bloody stools.   Resp: No shortness of breath with exertion or at rest.  No excess mucus, no productive cough,  No non-productive cough,  No coughing up of blood.  No change in color of mucus.  No wheezing.  No chest wall deformity  Skin: no rash or lesions.  GU: no dysuria, change in color of urine, no urgency or frequency.  No flank pain, no hematuria   MS:  No joint pain or swelling.  No decreased range of motion.  No back pain.    Physical Exam  BP 128/80 (BP Location: Left Arm, Patient Position: Sitting, Cuff Size: Normal)   Pulse 86   Temp 98.2 F (36.8 C) (Oral)   Ht 5\' 8"  (1.727 m)   Wt 213 lb 6.4 oz (96.8 kg)   SpO2 98%   BMI 32.45 kg/m   GEN: A/Ox3; pleasant , NAD, well nourished    HEENT:  [place]/AT,   NOSE-clear, THROAT-clear, no lesions, no postnasal drip or exudate noted.  Tongue is midline with normal movement  NECK:  Supple w/ fair ROM; no JVD; normal carotid impulses w/o bruits; no thyromegaly or nodules palpated; no lymphadenopathy.    RESP  Clear  P & A; w/o, wheezes/ rales/ or rhonchi. no accessory muscle use, no dullness to percussion  CARD:  RRR, no m/r/g, no peripheral edema, pulses intact, no cyanosis or clubbing.  GI:   Soft & nt; nml bowel sounds; no organomegaly or masses detected.   Musco: Warm bil, no deformities or joint swelling noted.   Neuro: alert, no focal deficits noted.    Skin: Warm, no lesions or rashes    Lab Results:  CBC   BMET  No results found for: "BNP"  ProBNP No results found for: "PROBNP"  Imaging: No results found.        No data to display          No results found for: "NITRICOXIDE"      Assessment & Plan:   OSA (obstructive sleep apnea) Good compliance on  inspire device.  Patient education on remote usage.  Voltage decreased to 1.4 V for comfort. Pause time increased to 20 minutes. Patient has perceived benefit..  Continue to use inspire device at bedtime and with naps. Set up inspire titration study.  Plan  Patient Instructions  Continue on Inspire At bedtime , Use all night long while sleeping. And with naps.  Set up Inspire Titration study .  Healthy sleep regimen  Follow up in 6 weeks with Dr. Wynona Neat and As needed      Insomnia Continue  on current regimen.  Restless leg syndrome Continue on current regimen.    Rubye Oaks, NP 02/10/2023

## 2023-02-10 NOTE — Patient Instructions (Addendum)
Continue on Inspire At bedtime , Use all night long while sleeping. And with naps.  Set up Inspire Titration study .  Healthy sleep regimen  Follow up in 6 weeks with Dr. Wynona Neat and As needed

## 2023-02-10 NOTE — Assessment & Plan Note (Signed)
Continue on current regimen .   

## 2023-02-10 NOTE — Assessment & Plan Note (Signed)
Good compliance on inspire device.  Patient education on remote usage.  Voltage decreased to 1.4 V for comfort. Pause time increased to 20 minutes. Patient has perceived benefit..  Continue to use inspire device at bedtime and with naps. Set up inspire titration study.  Plan  Patient Instructions  Continue on Inspire At bedtime , Use all night long while sleeping. And with naps.  Set up Inspire Titration study .  Healthy sleep regimen  Follow up in 6 weeks with Dr. Wynona Neat and As needed

## 2023-03-03 ENCOUNTER — Encounter (HOSPITAL_BASED_OUTPATIENT_CLINIC_OR_DEPARTMENT_OTHER): Payer: Medicare Other | Admitting: Pulmonary Disease

## 2023-03-10 ENCOUNTER — Ambulatory Visit (HOSPITAL_BASED_OUTPATIENT_CLINIC_OR_DEPARTMENT_OTHER): Payer: Medicare Other | Attending: Adult Health | Admitting: Pulmonary Disease

## 2023-03-10 DIAGNOSIS — H35033 Hypertensive retinopathy, bilateral: Secondary | ICD-10-CM | POA: Diagnosis not present

## 2023-03-10 DIAGNOSIS — H47012 Ischemic optic neuropathy, left eye: Secondary | ICD-10-CM | POA: Diagnosis not present

## 2023-03-10 DIAGNOSIS — G4733 Obstructive sleep apnea (adult) (pediatric): Secondary | ICD-10-CM | POA: Insufficient documentation

## 2023-03-10 DIAGNOSIS — H35373 Puckering of macula, bilateral: Secondary | ICD-10-CM | POA: Diagnosis not present

## 2023-03-10 DIAGNOSIS — H47011 Ischemic optic neuropathy, right eye: Secondary | ICD-10-CM | POA: Diagnosis not present

## 2023-03-10 DIAGNOSIS — H43812 Vitreous degeneration, left eye: Secondary | ICD-10-CM | POA: Diagnosis not present

## 2023-03-11 ENCOUNTER — Other Ambulatory Visit: Payer: Self-pay | Admitting: Neurology

## 2023-03-11 NOTE — Telephone Encounter (Signed)
Pt last seen on 12/09/22 per note "For the restless legs, we discussed increasing the Mirapex to 2 x 0.5 mg, either both at bedtime or 1 at dinner and 1 at that time"  No 6 month follow up scheduled

## 2023-03-18 ENCOUNTER — Ambulatory Visit (INDEPENDENT_AMBULATORY_CARE_PROVIDER_SITE_OTHER): Payer: Medicare Other | Admitting: Pulmonary Disease

## 2023-03-18 ENCOUNTER — Encounter: Payer: Self-pay | Admitting: Pulmonary Disease

## 2023-03-18 VITALS — BP 134/74 | HR 65 | Ht 68.0 in | Wt 214.2 lb

## 2023-03-18 DIAGNOSIS — G2581 Restless legs syndrome: Secondary | ICD-10-CM | POA: Diagnosis not present

## 2023-03-18 DIAGNOSIS — F5104 Psychophysiologic insomnia: Secondary | ICD-10-CM | POA: Diagnosis not present

## 2023-03-18 DIAGNOSIS — G4733 Obstructive sleep apnea (adult) (pediatric): Secondary | ICD-10-CM | POA: Diagnosis not present

## 2023-03-18 MED ORDER — GABAPENTIN ENACARBIL ER 600 MG PO TBCR: 600.0000 mg | EXTENDED_RELEASE_TABLET | Freq: Every evening | ORAL | 3 refills | Status: DC

## 2023-03-18 NOTE — Patient Instructions (Signed)
I will see you in 4 to 6 weeks  How your device works was changed in the office visit today  Your new settings as we discussed during the visit  As long as you continue to feel well, the number of events you are having is less than will which started  Continue to pay attention to discussions regarding your snoring  We may need to put things in place to prevent you from rolling on to your back when you are sleeping  Call with significant concerns

## 2023-03-18 NOTE — Progress Notes (Signed)
[name]    [id number]    Mar 29, 1951  Primary Care Physician:Hall, Kathleene Hazel, MD  Referring Physician: Benita Stabile, MD [address] [place],  [place] [address]  Chief complaint:   Patient with obstructive sleep apnea  HPI:  Recently had in lab titration study performed for use sleep apnea -Sleep study was reviewed showing persistence of events especially when he lays on his back during the study -Slept optimally at about a voltage of 1.5  Comes in today at a current voltage of 1.4  He is feeling generally well Feels his sleep quality is better following having the inspire device placed  Wakes up feeling like he is at a good nights rest No significant fatigue during the day  Functioning relatively well  His health has been relatively stable  Inspire instrumentation September 09, 2022 by Dr. Jenne Pane Also does struggle with restless leg syndrome  Split-night study performed April 06, 2022 showing severe obstructive sleep apnea with AHI of 39, SpO2 low of 85 with optimal control of 7 to 10 cm of water  Drug-induced endoscopy was performed July 21, 2022 Implantation September 09, 2022 Device activation was November 26, 2022  Compliance from his device shows excellent compliance with device use  Incoming voltage was 1.4, Normal tongue movement Treatment duration of 8 hours Therapy delay of 30 minutes  With good tolerance of his inspire device he was scheduled for the titration study and it was noted that he continued to have significant apneic events while laying supine  On questioning he does report that his wife still tells him he snores  Outpatient Encounter Medications as of 03/18/2023  Medication Sig   DULoxetine (CYMBALTA) 30 MG capsule Take 30 mg by mouth 2 (two) times daily.   gabapentin (NEURONTIN) 300 MG capsule TAKE 1 CAPSULE BY MOUTH THREE TIMES DAILY.   hydrochlorothiazide (MICROZIDE) 12.5 MG capsule Take 12.5 mg by mouth every morning.     pramipexole (MIRAPEX) 0.5 MG tablet TAKE 1 TABLET AT DINNER AND 1 TABLET AT BEDTIME.   predniSONE (DELTASONE) 10 MG tablet Take by mouth.   QUEtiapine (SEROQUEL) 50 MG tablet Take 50 mg by mouth at bedtime.   traMADol (ULTRAM) 50 MG tablet Take 50 mg by mouth 2 (two) times daily as needed.   No facility-administered encounter medications on file as of 03/18/2023.    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   Sleep apnea    uses Cpap nightly    Past Surgical History:  Procedure Laterality Date   BACK SURGERY     lumbar 2020   CATARACT EXTRACTION     10-15 years ago   COLONOSCOPY  05/25/2012   Procedure: COLONOSCOPY;  Surgeon: Malissa Hippo, MD;  Location: [facility];  Service: Endoscopy;  Laterality: N/A;  1030   COLONOSCOPY N/A 05/14/2017   Procedure: COLONOSCOPY;  Surgeon: Malissa Hippo, MD;  Location: [facility];  Service: Endoscopy;  Laterality: N/A;  10:10   CYSTOSCOPY/RETROGRADE/URETEROSCOPY Right  06/07/2014   Procedure: CYSTOSCOPY, RIGHT RETROGRADE, RIGHT URETEROSCOPY, RIGHT URETERAL BALLOON DILATATION, BIOPSY, RIGHT URETERAL STENT;  Surgeon: Chelsea Aus, MD;  Location: [facility];  Service: Urology;  Laterality: Right;   DRUG INDUCED ENDOSCOPY Right 07/21/2022   Procedure: DRUG INDUCED ENDOSCOPY;  Surgeon: Christia Reading, MD;  Location: [facility];  Service: ENT;  Laterality: Right;   I & D EXTREMITY Left 05/01/2021   Procedure: LEFT INDEX AND LONG FINGER AMPUTATION AND RING FINGER NAIL BED REPAIR.;  Surgeon: Betha Loa, MD;  Location: [facility];   Service: Orthopedics;  Laterality: Left;  LEFT INDEX AND LONG FINGER AMPUTATION AND RING FINGER NAIL BED REPAIR.   IMPLANTATION OF HYPOGLOSSAL NERVE STIMULATOR Right 09/09/2022   Procedure: IMPLANTATION OF HYPOGLOSSAL NERVE STIMULATOR;  Surgeon: Christia Reading, MD;  Location: [facility];  Service: ENT;  Laterality: Right;   kidney stone  1983   Had partial right nephrectomy   KNEE ARTHROSCOPY WITH MEDIAL MENISECTOMY Right 04/14/2018   Procedure: KNEE ARTHROSCOPY WITH MEDIAL MENISECTOMY;  Surgeon: Vickki Hearing, MD;  Location: [facility];  Service: Orthopedics;  Laterality: Right;   left foot surgery  2010   repair of joint   PARTIAL KNEE ARTHROPLASTY Left 09/16/2021   Procedure: UNICOMPARTMENTAL KNEE MEDIALLY;  Surgeon: Durene Romans, MD;  Location: [facility];  Service: Orthopedics;  Laterality: Left;   WRIST SURGERY Bilateral     Family History  Problem Relation Age of Onset   Cancer Father        Bladder cancer   Lung cancer Brother    Healthy Mother    Healthy Brother    Healthy Brother     Social History   Socioeconomic History   Marital status: Married    Spouse name: Etta Gleave   Number of children: Not on file   Years of education: 18   Highest education level: Master's degree (e.g., MA, MS, MEng, MEd, MSW, MBA)  Occupational History   Occupation: Retired    Comment: Environmental manager for [employer]  Tobacco Use   Smoking status: Former    Types: Gaffer exposure: Never   Smokeless tobacco: Never  Vaping Use   Vaping Use: Never used  Substance and Sexual Activity   Alcohol use: Yes    Comment: rare   Drug use: No   Sexual activity: Yes    Birth control/protection: None  Other Topics Concern   Not on file  Social History Narrative   He is married.  Has no children.  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     December 2020: He said he used to walk about 2 to 3 miles a day with his wife, but now she keeps going and he has to stop because  of dyspnea.   Social Determinants of Health   Financial Resource Strain: Low Risk  (05/25/2022)   Overall Financial Resource Strain (CARDIA)    Difficulty of Paying Living Expenses: Not hard at all  Food Insecurity: No Food Insecurity (05/25/2022)   Hunger Vital Sign    Worried About Running Out of Food in the Last Year: Never true    Ran Out of Food in the Last Year: Never true  Transportation Needs: No Transportation Needs (05/25/2022)   PRAPARE - Administrator, Civil Service (Medical): No    Lack of Transportation (Non-Medical): No  Physical Activity: Sufficiently Active (05/25/2022)   Exercise Vital Sign    Days of Exercise per  Week: 5 days    Minutes of Exercise per Session: 30 min  Stress: No Stress Concern Present (05/25/2022)   [employer] of Occupational Health - Occupational Stress Questionnaire    Feeling of Stress : Only a little  Social Connections: Socially Integrated (05/25/2022)   Social Connection and Isolation Panel [NHANES]    Frequency of Communication with Friends and Family: More than three times a week    Frequency of Social Gatherings with Friends and Family: More than three times a week    Attends Religious Services: More than 4 times per year    Active Member of [employer] or Organizations: Yes    Attends Engineer, structural: More than 4 times per year    Marital Status: Married  Catering manager Violence: Not At Risk (05/25/2022)   Humiliation, Afraid, Rape, and Kick questionnaire    Fear of Current or Ex-Partner: No    Emotionally Abused: No    Physically Abused: No    Sexually Abused: No    Review of Systems  Respiratory:  Positive for apnea.   Psychiatric/Behavioral:  Positive for sleep disturbance.     Vitals:   03/18/23 1022  BP: 134/74  Pulse: 65  SpO2: 97%     Physical Exam Constitutional:      Appearance: He is obese.  HENT:     Head: Normocephalic.     Right Ear: Tympanic membrane normal.     Mouth/Throat:      Mouth: Mucous membranes are moist.  Eyes:     General: No scleral icterus.    Pupils: Pupils are equal, round, and reactive to light.  Cardiovascular:     Rate and Rhythm: Normal rate and regular rhythm.     Heart sounds: No murmur heard.    No friction rub.  Pulmonary:     Effort: No respiratory distress.     Breath sounds: No stridor. No wheezing or rhonchi.  Musculoskeletal:     Cervical back: No rigidity or tenderness.  Neurological:     Mental Status: He is alert.  Psychiatric:        Mood and Affect: Mood normal.      Data Reviewed: Compliance data reviewed  Sleep study reviewed showing most optimal setting about 1.5  His device was interrogated in the office today Stimulation settings were changed during the visit today after interrogation Was switched to be mode -Therapy delay of 30 minutes -Voltage of 0.7 -Therapy duration of 8 hours  Assessment:  He appears to be doing well with his inspire device -Continues to feel it continues to benefit Waking up feeling like he is at a good nights rest  He does have insomnia for which he is on medication  Restless leg syndrome continues to be a concern for him -On Mirapex, Neurontin  Plan/Recommendations:  Inspire device changes made  New voltage setting of 0.7 in the B-mode Pause time increased to 20 minutes  therapy duration of 8 hours  continues to have problem with his restless legs -Will change his gabapentin to gabapentin enacarbil  I will see him in about 4 weeks  Encouraged to continue to pay attention to discussions about his persistent snoring  May need to optimize sleep position   Virl Diamond MD [facility] 03/18/2023, 5:09 PM  CC: Benita Stabile, MD

## 2023-03-25 DIAGNOSIS — H43812 Vitreous degeneration, left eye: Secondary | ICD-10-CM | POA: Diagnosis not present

## 2023-03-25 DIAGNOSIS — H47011 Ischemic optic neuropathy, right eye: Secondary | ICD-10-CM | POA: Diagnosis not present

## 2023-03-25 DIAGNOSIS — H35033 Hypertensive retinopathy, bilateral: Secondary | ICD-10-CM | POA: Diagnosis not present

## 2023-03-25 DIAGNOSIS — H47012 Ischemic optic neuropathy, left eye: Secondary | ICD-10-CM | POA: Diagnosis not present

## 2023-03-25 DIAGNOSIS — H35373 Puckering of macula, bilateral: Secondary | ICD-10-CM | POA: Diagnosis not present

## 2023-03-27 ENCOUNTER — Telehealth: Payer: Self-pay | Admitting: Pulmonary Disease

## 2023-03-27 DIAGNOSIS — G4733 Obstructive sleep apnea (adult) (pediatric): Secondary | ICD-10-CM

## 2023-03-27 NOTE — Telephone Encounter (Signed)
Call patient  Sleep study result  Date of study: 03/10/2023  Impression: Suboptimal amplitude achieved during titration of inspire device  Most optimal setting of 1.5 V was still associated with significant number of apneic events   Recommendation:  Office follow-up for optimization  Device interrogation and optimization

## 2023-03-27 NOTE — Procedures (Signed)
POLYSOMNOGRAPHY  Last, First: Johnny, Cuevas MRN: [id number] Gender: Male Age (years): 72 Weight (lbs): 211 DOB: 1950-12-21 BMI: 32 Primary Care: No PCP Epworth Score: 5 Referring: Tammy Parrett Technician: Lowry Ram Interpreting: Tomma Lightning MD Study Type: inspire device titration<br> Ordered Study Type: inspire device titration Study date: 03/10/2023 Location: [facility] CLINICAL INFORMATION [name] is a 72 year old Male and was referred to the sleep center for evaluation of G47.33 OSA: Adult and Pediatric (327.23). Indications include Dyspnea, Hypertension (401.9), Insomnia (780.52), Obesity, OSA, Restless Legs.  MEDICATIONS Patient self administered medications include: Quatiapine, TRAMADOL. Medications administered during study include No sleep medicine administered.  SLEEP STUDY TECHNIQUE The patient underwent an attended overnight polysomnography titration to assess the effects of hypoglossal nerve stimulator. The following variables were monitored: EEG(C4-A1, C3-A2, O1-A2, O2-A1), EOG, submental and leg EMG, ECG, oxyhemoglobin saturation by pulse oximetry, thoracic and abdominal respiratory effort belts, nasal/oral airflow by pressure sensor, body position sensor and snoring sensor. CPAP pressure was titrated to eliminate apneas, hypopneas and oxygen desaturation. Hypopneas were scored per AASM definition IB (4% desaturation)  TECHNICIAN COMMENTS Comments added by Technician: None. Patient was restless all through the night. Comments added by Scorer: N/A SLEEP ARCHITECTURE The study was initiated at 10:01:42 PM and terminated at 5:02:23 AM. Total recorded time was 420.7 minutes. EEG confirmed total sleep time was 382 minutes yielding a sleep efficiency of 90.8%. Sleep onset after lights out was 12.2 minutes with a REM latency of 84.5 minutes. The patient spent 3.8% of the night in stage N1 sleep, 83.4% in stage N2 sleep, 0.0% in stage N3 and 12.8% in REM. The  Arousal Index was 19.6/hour. RESPIRATORY PARAMETERS There were a total of 278 respiratory disturbances out of which 111 were apneas ( 98 obstructive, 4 mixed, 9 central) and 167 hypopneas. The apnea/hypopnea index (AHI) was 43.7 events/hour. The central sleep apnea index was 1.4 events/hour. The REM AHI was 86.9 events/hour and NREM AHI was 37.3 events/hour. The supine AHI was 69.2 events/hour and the non supine AHI was 29.5 supine during 35.62% of sleep. Respiratory disturbances were associated with oxygen desaturation down to a nadir of 81.0% during sleep. The mean oxygen saturation during the study was 90.5%. The cumulative time under 88% oxygen saturation was 5.5 minutes. Optimal amplitude not acheived.  LEG MOVEMENT DATA The total leg movements were 8 with a resulting leg movement index of 1.3/hr. Associated arousal with leg movement index was 0.2/hr. CARDIAC DATA The underlying cardiac rhythm was most consistent with sinus rhythm. Mean heart rate during sleep was 66.3 bpm. Additional rhythm abnormalities include PVCs.  IMPRESSIONS - Electrocardiographic data showed presence of PVCs. - The patient snored with moderate snoring volume. - Severe Obstructive Sleep apnea(OSA) Sub-Optimal Amplitude achieved. - Most optimal amplitude during the study was 1.5 V where patient achieved a good amount of sleep, but, there was persistence of sleep disordered breathing events - No Significant Central Sleep Apnea (CSA) - No significant periodic leg movements(PLMs) during sleep. However, no significant associated arousals. - Normal sleep efficiency, normal primary sleep latency, normal REM sleep latency and long slow wave latency.  DIAGNOSIS - Obstructive Sleep Apnea (G47.33)  RECOMMENDATIONS - Recommend follow-up for optimization of device, Device integration and adjustment. - Most optimal amplitude at 1.50V was still associated with persistence of sleep disordered breathing events. - Avoid alcohol,  sedatives and other CNS depressants that may worsen sleep apnea and disrupt normal sleep architecture. - Sleep hygiene should be reviewed to assess factors that may  improve sleep quality. - Weight management and regular exercise should be initiated or continued. - Return to Sleep Center for re-evaluation after 4 weeks of therapy  [Electronically signed] 03/27/2023 05:19 PM  Virl Diamond MD NPI: [id number]

## 2023-03-31 NOTE — Telephone Encounter (Signed)
Recent mychart messages sent by pt:  [name] "[name]"  P [facility] (supporting Christen Butter, CMA)2 days ago    The deductible was over $500 per month so Inopted to stay on the 300 mg x 3. But thanks for thinking of alternatives. I would like to talk to you about sleep medications. I take Quentenpia( sp ?) after years of Ambien.   In addition to my apnea related sleep problems, I suffer from Restless Leg Syndrome, a very active case of it. I have not slept welll my whole adult life. After taking Ambien for years, my doctor switched me to the other medicine Quentapia ( sp?). After everything you have learned about my sleep habits, swould you suggest anything different ?

## 2023-03-31 NOTE — Telephone Encounter (Signed)
Any sleep medication that helps you get some sleep is okay  Quetiapine is used if there is associated depression, generalized anxiety, bipolar disorder-if it is working, you do not need to change it.  Medications that are FDA approved will be medications in the line of Ambien, Lunesta, Orexin antagonist-Belsomra, Dayvigo, Quviviq.  There is no data that says one thing works better than another

## 2023-04-01 DIAGNOSIS — M25561 Pain in right knee: Secondary | ICD-10-CM | POA: Diagnosis not present

## 2023-04-26 ENCOUNTER — Encounter: Payer: Self-pay | Admitting: Adult Health

## 2023-04-26 ENCOUNTER — Ambulatory Visit (INDEPENDENT_AMBULATORY_CARE_PROVIDER_SITE_OTHER): Payer: Medicare Other | Admitting: Adult Health

## 2023-04-26 VITALS — BP 126/80 | HR 97 | Ht 68.0 in | Wt 206.0 lb

## 2023-04-26 DIAGNOSIS — G4733 Obstructive sleep apnea (adult) (pediatric): Secondary | ICD-10-CM | POA: Diagnosis not present

## 2023-04-26 NOTE — Assessment & Plan Note (Signed)
Severe obstructive sleep apnea status post inspire implantation.  Titration study in May showed residual events especially in supine position.  Last visit patient was changed to new voltage setting at 0.7 v  in B-mode.  Patient says this is a very comfortable setting for him.  We discussed going up by 1 level if tolerated if unable to tolerate to resume back down to 0.7 V.  Will set up home sleep study (Watch Pad) in 4 weeks.  Have him follow back up in 6 weeks  Plan  Patient Instructions  Continue on Inspire At bedtime, Use all night long while sleeping. And with naps.  Titrate up 1 level if tolerated.  Set up home sleep study in 4 weeks.  Follow up in 6 weeks with Dr. Wynona Neat and As needed

## 2023-04-26 NOTE — Progress Notes (Signed)
@Patient  ID: [name], male    DOB: [birth year], 72 y.o.   MRN: [id number]  Chief Complaint  Patient presents with   Follow-up    Referring provider: Benita Stabile, MD  HPI: 72 year old male seen for sleep consult March 26, 2022 to establish for longstanding sleep apnea on CPAP therapy.  Patient was having difficulty tolerating CPAP therapy.  He underwent inspire implantation on September 09, 2022 by Dr. Jenne Pane Medical history significant for restless leg syndrome  TEST/EVENTS :  Split-night sleep study April 06, 2022 severe obstructive sleep apnea AHI 39.5/SpO2 low 85%, optimal control CPAP 7 -10 cm H2O.   Drug-induced sleep endoscopy July 21, 2022, ENT Dr. Jenne Pane (anterior posterior collapse)   Hypoglossal nerve stimulator implantation September 09, 2022, ENT Dr. Genevie Cheshire device activation November 26, 2022  Inspire titration study on 03/10/23 showed persistent residual events, increased in supine position. Optimal control was 1.5v, (+residual events) .   04/26/2023 Follow up : OSA  Returns for 6-week follow-up.  Patient was seen last visit after inspire titration study.  Patient had optimal sleep at 1.5 V but continued to have residual events especially in the spine position.  Last visit patient was changed to new voltage setting at 0.7 V in  B mode.  Patient following this is been very comfortable for him.  He is unable to sleep very well.  Also snoring has decreased on the setting.  Inspire compliance report shows excellent compliance with 100% usage.  Daily average usage at 7 hours.  He is currently on 0.7 V.  Range is 0.6-1.0.  Start delay 30 minutes pulse time 20 duration at 8 hours.       Allergies  Allergen Reactions   Nsaids Other (See Comments)    "Cannot take because of my kidney disease."    Immunization History  Administered Date(s) Administered   Fluad Quad(high Dose 65+) 10/02/2022   Influenza, High Dose Seasonal PF 11/11/2021   Influenza,inj,quad, With  Preservative 07/26/2020   Influenza-Unspecified 08/26/2018   Tdap 05/01/2021   Zoster Recombinant(Shingrix) 02/24/2018, 05/23/2018    Past Medical History:  Diagnosis Date   Anemia    Anxiety    Arthritis    spinal stenosis   Depression    History of kidney stones    Hyperlipidemia    Hypertension    Kidney function abnormal 1983   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   Sleep apnea    uses Cpap nightly    Tobacco History: Social History   Tobacco Use  Smoking Status Former   Types: Cigars   Passive exposure: Never  Smokeless Tobacco Never   Counseling given: Not Answered   Outpatient Medications Prior to Visit  Medication Sig Dispense Refill   DULoxetine (CYMBALTA) 30 MG capsule Take 30 mg by mouth 2 (two) times daily.     Gabapentin Enacarbil 600 MG TBCR Take 1 tablet (600 mg total) by mouth at bedtime. 30 tablet 3   hydrochlorothiazide (MICROZIDE) 12.5 MG capsule Take 12.5 mg by mouth every morning.      pramipexole (MIRAPEX) 0.5 MG tablet TAKE 1 TABLET AT DINNER AND 1 TABLET AT BEDTIME. 180 tablet 0   QUEtiapine (SEROQUEL) 50 MG tablet Take 50 mg by mouth at bedtime.     traMADol (ULTRAM) 50 MG tablet Take 50 mg by mouth 2 (two) times daily as needed.     predniSONE (DELTASONE) 10 MG tablet Take by mouth.  No facility-administered medications prior to visit.     Review of Systems:   Constitutional:   No  weight loss, night sweats,  Fevers, chills, fatigue, or  lassitude.  HEENT:   No headaches,  Difficulty swallowing,  Tooth/dental problems, or  Sore throat,                No sneezing, itching, ear ache, nasal congestion, post nasal drip,   CV:  No chest pain,  Orthopnea, PND, swelling in lower extremities, anasarca, dizziness, palpitations, syncope.   GI  No heartburn, indigestion, abdominal pain, nausea, vomiting, diarrhea, change in bowel habits, loss of appetite, bloody stools.   Resp: No  shortness of breath with exertion or at rest.  No excess mucus, no productive cough,  No non-productive cough,  No coughing up of blood.  No change in color of mucus.  No wheezing.  No chest wall deformity  Skin: no rash or lesions.  GU: no dysuria, change in color of urine, no urgency or frequency.  No flank pain, no hematuria   MS:  No joint pain or swelling.  No decreased range of motion.  No back pain.    Physical Exam  BP 126/80 (BP Location: Left Arm, Patient Position: Sitting, Cuff Size: Normal)   Pulse 97   Ht 5\' 8"  (1.727 m)   Wt 206 lb (93.4 kg)   SpO2 99%   BMI 31.32 kg/m   GEN: A/Ox3; pleasant , NAD, well nourished    HEENT:  [place]/AT,  NOSE-clear, THROAT-clear, no lesions, no postnasal drip or exudate noted. Normal tongue movement   NECK:  Supple w/ fair ROM; no JVD; normal carotid impulses w/o bruits; no thyromegaly or nodules palpated; no lymphadenopathy.    RESP  Clear  P & A; w/o, wheezes/ rales/ or rhonchi. no accessory muscle use, no dullness to percussion  CARD:  RRR, no m/r/g, no peripheral edema, pulses intact, no cyanosis or clubbing.  GI:   Soft & nt; nml bowel sounds; no organomegaly or masses detected.   Musco: Warm bil, no deformities or joint swelling noted.   Neuro: alert, no focal deficits noted.    Skin: Warm, no lesions or rashes    Lab Results:  CBC    BNP No results found for: "BNP"  ProBNP No results found for: "PROBNP"  Imaging: No results found.        No data to display          No results found for: "NITRICOXIDE"      Assessment & Plan:   OSA (obstructive sleep apnea) Severe obstructive sleep apnea status post inspire implantation.  Titration study in May showed residual events especially in supine position.  Last visit patient was changed to new voltage setting at 0.7 v  in B-mode.  Patient says this is a very comfortable setting for him.  We discussed going up by 1 level if tolerated if unable to tolerate  to resume back down to 0.7 V.  Will set up home sleep study (Watch Pad) in 4 weeks.  Have him follow back up in 6 weeks  Plan  Patient Instructions  Continue on Inspire At bedtime, Use all night long while sleeping. And with naps.  Titrate up 1 level if tolerated.  Set up home sleep study in 4 weeks.  Follow up in 6 weeks with Dr. Wynona Neat and As needed        Rubye Oaks, NP 04/26/2023

## 2023-04-26 NOTE — Patient Instructions (Addendum)
Continue on Inspire At bedtime, Use all night long while sleeping. And with naps.  Titrate up 1 level if tolerated.  Set up home sleep study in 4 weeks.  Follow up in 6 weeks with Dr. Wynona Neat and As needed

## 2023-04-27 ENCOUNTER — Other Ambulatory Visit: Payer: Self-pay | Admitting: Adult Health

## 2023-04-27 DIAGNOSIS — G4733 Obstructive sleep apnea (adult) (pediatric): Secondary | ICD-10-CM

## 2023-04-28 DIAGNOSIS — N1831 Chronic kidney disease, stage 3a: Secondary | ICD-10-CM | POA: Diagnosis not present

## 2023-04-28 DIAGNOSIS — Z79899 Other long term (current) drug therapy: Secondary | ICD-10-CM | POA: Diagnosis not present

## 2023-04-28 DIAGNOSIS — R809 Proteinuria, unspecified: Secondary | ICD-10-CM | POA: Diagnosis not present

## 2023-05-04 DIAGNOSIS — G4733 Obstructive sleep apnea (adult) (pediatric): Secondary | ICD-10-CM | POA: Diagnosis not present

## 2023-05-04 DIAGNOSIS — Z6832 Body mass index (BMI) 32.0-32.9, adult: Secondary | ICD-10-CM | POA: Diagnosis not present

## 2023-05-04 DIAGNOSIS — I129 Hypertensive chronic kidney disease with stage 1 through stage 4 chronic kidney disease, or unspecified chronic kidney disease: Secondary | ICD-10-CM | POA: Diagnosis not present

## 2023-05-04 DIAGNOSIS — N1831 Chronic kidney disease, stage 3a: Secondary | ICD-10-CM | POA: Diagnosis not present

## 2023-05-06 DIAGNOSIS — L821 Other seborrheic keratosis: Secondary | ICD-10-CM | POA: Diagnosis not present

## 2023-05-06 DIAGNOSIS — L814 Other melanin hyperpigmentation: Secondary | ICD-10-CM | POA: Diagnosis not present

## 2023-05-06 DIAGNOSIS — D692 Other nonthrombocytopenic purpura: Secondary | ICD-10-CM | POA: Diagnosis not present

## 2023-05-06 DIAGNOSIS — D225 Melanocytic nevi of trunk: Secondary | ICD-10-CM | POA: Diagnosis not present

## 2023-05-11 DIAGNOSIS — E782 Mixed hyperlipidemia: Secondary | ICD-10-CM | POA: Diagnosis not present

## 2023-05-17 DIAGNOSIS — E669 Obesity, unspecified: Secondary | ICD-10-CM | POA: Diagnosis not present

## 2023-05-17 DIAGNOSIS — G2581 Restless legs syndrome: Secondary | ICD-10-CM | POA: Diagnosis not present

## 2023-05-17 DIAGNOSIS — G4700 Insomnia, unspecified: Secondary | ICD-10-CM | POA: Diagnosis not present

## 2023-05-17 DIAGNOSIS — I129 Hypertensive chronic kidney disease with stage 1 through stage 4 chronic kidney disease, or unspecified chronic kidney disease: Secondary | ICD-10-CM | POA: Diagnosis not present

## 2023-05-17 DIAGNOSIS — M1711 Unilateral primary osteoarthritis, right knee: Secondary | ICD-10-CM | POA: Diagnosis not present

## 2023-05-17 DIAGNOSIS — Z96652 Presence of left artificial knee joint: Secondary | ICD-10-CM | POA: Diagnosis not present

## 2023-05-17 DIAGNOSIS — E782 Mixed hyperlipidemia: Secondary | ICD-10-CM | POA: Diagnosis not present

## 2023-05-17 DIAGNOSIS — G4733 Obstructive sleep apnea (adult) (pediatric): Secondary | ICD-10-CM | POA: Diagnosis not present

## 2023-05-17 DIAGNOSIS — F321 Major depressive disorder, single episode, moderate: Secondary | ICD-10-CM | POA: Diagnosis not present

## 2023-05-17 DIAGNOSIS — S68119S 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, sequela: Secondary | ICD-10-CM | POA: Diagnosis not present

## 2023-05-17 DIAGNOSIS — N1831 Chronic kidney disease, stage 3a: Secondary | ICD-10-CM | POA: Diagnosis not present

## 2023-05-17 DIAGNOSIS — D638 Anemia in other chronic diseases classified elsewhere: Secondary | ICD-10-CM | POA: Diagnosis not present

## 2023-05-24 ENCOUNTER — Ambulatory Visit (INDEPENDENT_AMBULATORY_CARE_PROVIDER_SITE_OTHER): Payer: Medicare Other

## 2023-05-24 DIAGNOSIS — G4733 Obstructive sleep apnea (adult) (pediatric): Secondary | ICD-10-CM | POA: Diagnosis not present

## 2023-06-08 ENCOUNTER — Encounter: Payer: Self-pay | Admitting: Adult Health

## 2023-06-08 ENCOUNTER — Ambulatory Visit (INDEPENDENT_AMBULATORY_CARE_PROVIDER_SITE_OTHER): Payer: Medicare Other | Admitting: Adult Health

## 2023-06-08 ENCOUNTER — Telehealth: Payer: Self-pay | Admitting: Pulmonary Disease

## 2023-06-08 VITALS — BP 108/70 | HR 79 | Ht 68.0 in | Wt 211.0 lb

## 2023-06-08 DIAGNOSIS — G4733 Obstructive sleep apnea (adult) (pediatric): Secondary | ICD-10-CM

## 2023-06-08 NOTE — Progress Notes (Signed)
@Patient  ID: [name], male    DOB: 1951-07-23, 72 y.o.   MRN: [id number]  Chief Complaint  Patient presents with   Follow-up    Referring provider: Benita Stabile, MD  HPI: 72 yo seen for sleep consult March 26, 2022 to establish for longstanding sleep apnea on CPAP therapy.  Patient was having difficulty tolerating CPAP.  He underwent inspire implantation September 09, 2022 by ENT, Dr. Jenne Pane Medical history significant for restless leg syndrome  TEST/EVENTS :  Split-night sleep study April 06, 2022 severe obstructive sleep apnea AHI 39.5/SpO2 low 85%, optimal control CPAP 7 -10 cm H2O.   Drug-induced sleep endoscopy July 21, 2022, ENT Dr. Jenne Pane (anterior posterior collapse)   Hypoglossal nerve stimulator implantation September 09, 2022, ENT Dr. Genevie Cheshire device activation November 26, 2022   Inspire titration study on 03/10/23 showed persistent residual events, increased in supine position. Optimal control was 1.5v, (+residual events) .   06/08/2023 Follow up : OSA (Inspire)  Patient returns for a 6 month follow up. We follow him for sleep apnea.  Patient previously been on CPAP but became intolerant.  He underwent inspire implantation November 2023.  Since having inspire he feels that it is working well for him.  He feels that he does sleep fairly well with inspire with less snoring and feels better during the daytime.  He did have the inspire titration study May 2024 this showed optimal control at 1.5 V however patient had significant residual events.  At follow-up visit he was changed to B mode at 0.7 v.  Clinically patient felt well on this setting.  Patient was set up for a watch pad home sleep study on current setting. This was done on 05/24/23 this showed ongoing residual events.  Patient that he is doing well continues to sleep each night with inspire.  Inspire compliance shows excellent compliance and nightly usage.  Patient was changed today in the office to C mode Level  0.6v . Range 0.5 to 0.9v. Inspire team present for today's visit.    Allergies  Allergen Reactions   Nsaids Other (See Comments)    "Cannot take because of my kidney disease."    Immunization History  Administered Date(s) Administered   Fluad Quad(high Dose 74+) 10/02/2022   Influenza, High Dose Seasonal PF 11/11/2021   Influenza,inj,quad, With Preservative 07/26/2020   Influenza-Unspecified 08/26/2018   Tdap 05/01/2021   Zoster Recombinant(Shingrix) 02/24/2018, 05/23/2018    Past Medical History:  Diagnosis Date   Anemia    Anxiety    Arthritis    spinal stenosis   Depression    History of kidney stones    Hyperlipidemia    Hypertension    Kidney function abnormal 1983   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   Sleep apnea    uses Cpap nightly    Tobacco History: Social History   Tobacco Use  Smoking Status Former   Types: Cigars   Passive exposure: Never  Smokeless Tobacco Never   Counseling given: Not Answered   Outpatient Medications Prior to Visit  Medication Sig Dispense Refill   Gabapentin Enacarbil 600 MG TBCR Take 1 tablet (600 mg total) by mouth at bedtime. 30 tablet 3   hydrochlorothiazide (MICROZIDE) 12.5 MG capsule Take 12.5 mg by mouth every morning.      pramipexole (MIRAPEX) 0.5 MG tablet TAKE 1 TABLET AT DINNER AND 1 TABLET AT BEDTIME. 180 tablet 0   QUEtiapine (SEROQUEL)  50 MG tablet Take 50 mg by mouth at bedtime.     traMADol (ULTRAM) 50 MG tablet Take 50 mg by mouth 2 (two) times daily as needed.     DULoxetine (CYMBALTA) 30 MG capsule Take 30 mg by mouth 2 (two) times daily. (Patient not taking: Reported on 06/08/2023)     No facility-administered medications prior to visit.     Review of Systems:   Constitutional:   No  weight loss, night sweats,  Fevers, chills, fatigue, or  lassitude.  HEENT:   No headaches,  Difficulty swallowing,  Tooth/dental problems, or  Sore throat,                 No sneezing, itching, ear ache, nasal congestion, post nasal drip,   CV:  No chest pain,  Orthopnea, PND, swelling in lower extremities, anasarca, dizziness, palpitations, syncope.   GI  No heartburn, indigestion, abdominal pain, nausea, vomiting, diarrhea, change in bowel habits, loss of appetite, bloody stools.   Resp: No shortness of breath with exertion or at rest.  No excess mucus, no productive cough,  No non-productive cough,  No coughing up of blood.  No change in color of mucus.  No wheezing.  No chest wall deformity  Skin: no rash or lesions.  GU: no dysuria, change in color of urine, no urgency or frequency.  No flank pain, no hematuria   MS:  No joint pain or swelling.  No decreased range of motion.  No back pain.    Physical Exam  BP 108/70 (BP Location: Left Arm, Patient Position: Sitting, Cuff Size: Normal)   Pulse 79   Ht 5\' 8"  (1.727 m)   Wt 211 lb (95.7 kg)   SpO2 98%   BMI 32.08 kg/m   GEN: A/Ox3; pleasant , NAD, well nourished    HEENT:  [place]/AT,   NOSE-clear, THROAT-clear, no lesions, no postnasal drip or exudate noted.  Normal tongue movement   NECK:  Supple w/ fair ROM; no JVD; normal carotid impulses w/o bruits; no thyromegaly or nodules palpated; no lymphadenopathy.    RESP  Clear  P & A; w/o, wheezes/ rales/ or rhonchi. no accessory muscle use, no dullness to percussion  CARD:  RRR, no m/r/g, no peripheral edema, pulses intact, no cyanosis or clubbing.  GI:   Soft & nt; nml bowel sounds; no organomegaly or masses detected.   Musco: Warm bil, no deformities or joint swelling noted.   Neuro: alert, no focal deficits noted.    Skin: Warm, no lesions or rashes    Lab Results:   BNP No results found for: "BNP"  ProBNP No results found for: "PROBNP"  Imaging: No results found.  Administration History     None           No data to display          No results found for: "NITRICOXIDE"      Assessment & Plan:   OSA  (obstructive sleep apnea) Severe obstructive sleep apnea status post inspire implantation.  Inspire titration study May 2024 showed ongoing residual events.  Patient was changed to new voltage setting 0.7 V in B mode.  Follow-up home sleep study on the setting showed ongoing residual events.  Clinically patient appears to be doing well.  Has perceived benefit.  And excellent usage.  Today in the office he was changed to 0.6 V and C mode.  Range at 0.5 to 0.9 V.  Patient advised may titrate up 1-2  levels slowly if comfortable.  On return visit if continues to have ongoing snoring may need to refer her back to ENT for an awake endoscopy for further evaluation of upper airway w/ Inspire.   Plan  Patient Instructions  Continue on Inspire At bedtime, Use all night long while sleeping. And with naps.  Titrate as tolerated.  Follow up in 6 weeks with Dr. Wynona Neat and As needed       I spent 33   minutes dedicated to the care of this patient on the date of this encounter to include pre-visit review of records, face-to-face time with the patient discussing conditions above, post visit ordering of testing, clinical documentation with the electronic health record, making appropriate referrals as documented, and communicating necessary findings to members of the patients care team.   Rubye Oaks, NP 06/08/2023

## 2023-06-08 NOTE — Assessment & Plan Note (Signed)
Severe obstructive sleep apnea status post inspire implantation.  Inspire titration study May 2024 showed ongoing residual events.  Patient was changed to new voltage setting 0.7 V in B mode.  Follow-up home sleep study on the setting showed ongoing residual events.  Clinically patient appears to be doing well.  Has perceived benefit.  And excellent usage.  Today in the office he was changed to 0.6 V and C mode.  Range at 0.5 to 0.9 V.  Patient advised may titrate up 1-2 levels slowly if comfortable.  On return visit if continues to have ongoing snoring may need to refer her back to ENT for an awake endoscopy for further evaluation of upper airway w/ Inspire.   Plan  Patient Instructions  Continue on Inspire At bedtime, Use all night long while sleeping. And with naps.  Titrate as tolerated.  Follow up in 6 weeks with Dr. Wynona Neat and As needed

## 2023-06-08 NOTE — Patient Instructions (Signed)
Continue on Inspire At bedtime, Use all night long while sleeping. And with naps.  Titrate as tolerated.  Follow up in 6 weeks with Dr. Wynona Neat and As needed

## 2023-06-08 NOTE — Telephone Encounter (Signed)
Call patient  Sleep study result  Date of study: 05/25/2023  Impression: Severe obstructive sleep apnea  Recommendation: Continue titration of inspire device as tolerated  Close clinical follow-up for optimization of treatment  Follow-up as previously scheduled

## 2023-06-17 NOTE — Telephone Encounter (Signed)
Spoke with patient regarding sleep study  Call patient   Sleep study result   Date of study: 05/25/2023   Impression: Severe obstructive sleep apnea   Recommendation: Continue titration of inspire device as tolerated   Close clinical follow-up for optimization of treatment   Follow-up as previously scheduled    Patient's voice was understanding. Nothing else further needed.

## 2023-06-21 ENCOUNTER — Encounter: Payer: Self-pay | Admitting: Adult Health

## 2023-07-13 ENCOUNTER — Ambulatory Visit (INDEPENDENT_AMBULATORY_CARE_PROVIDER_SITE_OTHER): Payer: Medicare Other | Admitting: Adult Health

## 2023-07-13 ENCOUNTER — Encounter: Payer: Self-pay | Admitting: Adult Health

## 2023-07-13 VITALS — BP 136/74 | HR 67 | Ht 68.0 in | Wt 211.4 lb

## 2023-07-13 DIAGNOSIS — G4733 Obstructive sleep apnea (adult) (pediatric): Secondary | ICD-10-CM

## 2023-07-13 DIAGNOSIS — G2581 Restless legs syndrome: Secondary | ICD-10-CM

## 2023-07-13 NOTE — Patient Instructions (Addendum)
Continue on Inspire At bedtime, Use all night long while sleeping. And with naps.  Watch Pat home sleep study.  Follow up in 6 weeks with Dr. Wynona Neat and As needed

## 2023-07-13 NOTE — Progress Notes (Signed)
On what is is what is visit is the  @Patient  ID: [name], male    DOB: [birth year], 72 y.o.   MRN: [id number]  Chief Complaint  Patient presents with   Follow-up    Referring provider: Benita Stabile, MD  HPI: 72 year old male seen for sleep consult March 26, 2022 to establish for longstanding sleep apnea on CPAP therapy.  Patient had CPAP intolerance.  Underwent inspire implantation September 09, 2022 by Dr. Jenne Pane, ENT.   Medical history significant for restless leg syndrome  TEST/EVENTS :  Split-night sleep study April 06, 2022 severe obstructive sleep apnea AHI 39.5/SpO2 low 85%, optimal control CPAP 7 -10 cm H2O.   Drug-induced sleep endoscopy July 21, 2022, ENT Dr. Jenne Pane (anterior posterior collapse)   Hypoglossal nerve stimulator implantation September 09, 2022, ENT Dr. Genevie Cheshire device activation November 26, 2022   Inspire titration study on 03/10/23 showed persistent residual events, increased in supine position. Optimal control was 1.5v, (+residual events) .   07/13/2023 Follow up : Follow up ; OSA (INSPIRE)  Patient presents for 1 month follow-up.  Patient has underlying sleep apnea previously on CPAP but became intolerant.  Underwent inspire implantation November 2023.  Patient has failed he has done well on inspire with less snoring and decreased daytime sleepiness.  He underwent inspire titration study May 2024 that showed optimal control on 1.5 V however had significant residual events.  At follow-up visit post titration study he was changed to B mode at 0.7 V.  Patient says he felt well on the setting.  He underwent a WatchPAT home sleep study that showed ongoing residual events.  Last visit patient was changed to C mode at 0.6 V.  He had a range at 0.6 to 1.0 V for titration.  Since last visit patient says he is doing okay but RLS has been worse.  When he is sleeping good with no snoring.   He is currently at 0.8 V/Level.  Compliance report shows 100% usage.  Daily  average usage at 5 hours.  Minimum pauses. Stimulation test today in office showed good tongue movement .   Stopped Cymbalta since last visit, says RLS has been worse. Restarted cymbalta yesterday.    Allergies  Allergen Reactions   Nsaids Other (See Comments)    "Cannot take because of my kidney disease."    Immunization History  Administered Date(s) Administered   Fluad Quad(high Dose 65+) 10/02/2022   Influenza, High Dose Seasonal PF 11/11/2021   Influenza,inj,quad, With Preservative 07/26/2020   Influenza-Unspecified 08/26/2018   Tdap 05/01/2021   Zoster Recombinant(Shingrix) 02/24/2018, 05/23/2018    Past Medical History:  Diagnosis Date   Anemia    Anxiety    Arthritis    spinal stenosis   Depression    History of kidney stones    Hyperlipidemia    Hypertension    Kidney function abnormal 1983   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   Sleep apnea    uses Cpap nightly    Tobacco History: Social History   Tobacco Use  Smoking Status Former   Types: Cigars   Passive exposure: Never  Smokeless Tobacco Never   Counseling given: Not Answered   Outpatient Medications Prior to Visit  Medication Sig Dispense Refill   DULoxetine (CYMBALTA) 30 MG capsule Take 30 mg by mouth 2 (two) times daily.     eszopiclone (LUNESTA) 2 MG TABS tablet Take 2 mg by mouth at bedtime  as needed.     Gabapentin Enacarbil 600 MG TBCR Take 1 tablet (600 mg total) by mouth at bedtime. 30 tablet 3   hydrochlorothiazide (MICROZIDE) 12.5 MG capsule Take 12.5 mg by mouth every morning.      pramipexole (MIRAPEX) 0.5 MG tablet TAKE 1 TABLET AT DINNER AND 1 TABLET AT BEDTIME. 180 tablet 0   QUEtiapine (SEROQUEL) 50 MG tablet Take 50 mg by mouth at bedtime.     traMADol (ULTRAM) 50 MG tablet Take 50 mg by mouth 2 (two) times daily as needed.     No facility-administered medications prior to visit.     Review of Systems:    Constitutional:   No  weight loss, night sweats,  Fevers, chills, + fatigue, or  lassitude.  HEENT:   No headaches,  Difficulty swallowing,  Tooth/dental problems, or  Sore throat,                No sneezing, itching, ear ache, nasal congestion, post nasal drip,   CV:  No chest pain,  Orthopnea, PND, swelling in lower extremities, anasarca, dizziness, palpitations, syncope.   GI  No heartburn, indigestion, abdominal pain, nausea, vomiting, diarrhea, change in bowel habits, loss of appetite, bloody stools.   Resp: No shortness of breath with exertion or at rest.  No excess mucus, no productive cough,  No non-productive cough,  No coughing up of blood.  No change in color of mucus.  No wheezing.  No chest wall deformity  Skin: no rash or lesions.  GU: no dysuria, change in color of urine, no urgency or frequency.  No flank pain, no hematuria   MS:  No joint pain or swelling.  No decreased range of motion.  No back pain.    Physical Exam    GEN: A/Ox3; pleasant , NAD, well nourished    HEENT:  [place]/AT, NOSE-clear, THROAT-clear, no lesions, no postnasal drip or exudate noted.   NECK:  Supple w/ fair ROM; no JVD; normal carotid impulses w/o bruits; no thyromegaly or nodules palpated; no lymphadenopathy.    RESP  Clear  P & A; w/o, wheezes/ rales/ or rhonchi. no accessory muscle use, no dullness to percussion  CARD:  RRR, no m/r/g, no peripheral edema, pulses intact, no cyanosis or clubbing.  GI:   Soft & nt; nml bowel sounds; no organomegaly or masses detected.   Musco: Warm bil, no deformities or joint swelling noted.   Neuro: alert, no focal deficits noted.    Skin: Warm, no lesions or rashes    Lab Results:   BNP No results found for: "BNP"  ProBNP No results found for: "PROBNP"  Imaging: No results found.  Administration History     None           No data to display          No results found for: "NITRICOXIDE"      Assessment & Plan:    OSA (obstructive sleep apnea) Sent here obstructive sleep apnea status post inspire implantation.  Inspire titration study showed ongoing residual events.  Patient has been changed to B mode and then again C mode for ongoing residual events.  Seems to be tolerating current level well.  Currently at 0.8 V/level 3. No changes to current setting . Will set up Watch Pat sleep study .  If on return has ongoing residual events would need to refer back to ENT for an awake endoscopy for further evaluation  Plan  Patient Instructions  Continue on Inspire At bedtime, Use all night long while sleeping. And with naps.  Watch Pat home sleep study.  Follow up in 6 weeks with Dr. Wynona Neat and As needed        Rubye Oaks, NP 07/13/2023

## 2023-07-13 NOTE — Assessment & Plan Note (Signed)
Sent here obstructive sleep apnea status post inspire implantation.  Inspire titration study showed ongoing residual events.  Patient has been changed to B mode and then again C mode for ongoing residual events.  Seems to be tolerating current level well.  Currently at 0.8 V/level 3. No changes to current setting . Will set up Watch Pat sleep study .  If on return has ongoing residual events would need to refer back to ENT for an awake endoscopy for further evaluation  Plan  Patient Instructions  Continue on Inspire At bedtime, Use all night long while sleeping. And with naps.  Watch Pat home sleep study.  Follow up in 6 weeks with Dr. Wynona Neat and As needed

## 2023-07-13 NOTE — Assessment & Plan Note (Signed)
Recent flare off Cymbalta.  Continue on current maintenance regimen.  Patient has recently restarted Cymbalta

## 2023-07-19 DIAGNOSIS — G2581 Restless legs syndrome: Secondary | ICD-10-CM | POA: Diagnosis not present

## 2023-07-19 DIAGNOSIS — G4733 Obstructive sleep apnea (adult) (pediatric): Secondary | ICD-10-CM | POA: Diagnosis not present

## 2023-07-19 DIAGNOSIS — F5105 Insomnia due to other mental disorder: Secondary | ICD-10-CM | POA: Diagnosis not present

## 2023-07-19 DIAGNOSIS — I129 Hypertensive chronic kidney disease with stage 1 through stage 4 chronic kidney disease, or unspecified chronic kidney disease: Secondary | ICD-10-CM | POA: Diagnosis not present

## 2023-07-19 DIAGNOSIS — Z713 Dietary counseling and surveillance: Secondary | ICD-10-CM | POA: Diagnosis not present

## 2023-07-19 DIAGNOSIS — F329 Major depressive disorder, single episode, unspecified: Secondary | ICD-10-CM | POA: Diagnosis not present

## 2023-07-19 DIAGNOSIS — F321 Major depressive disorder, single episode, moderate: Secondary | ICD-10-CM | POA: Diagnosis not present

## 2023-07-19 DIAGNOSIS — Z79899 Other long term (current) drug therapy: Secondary | ICD-10-CM | POA: Diagnosis not present

## 2023-07-19 DIAGNOSIS — N189 Chronic kidney disease, unspecified: Secondary | ICD-10-CM | POA: Diagnosis not present

## 2023-07-19 DIAGNOSIS — G4700 Insomnia, unspecified: Secondary | ICD-10-CM | POA: Diagnosis not present

## 2023-07-19 DIAGNOSIS — Z23 Encounter for immunization: Secondary | ICD-10-CM | POA: Diagnosis not present

## 2023-07-19 DIAGNOSIS — M792 Neuralgia and neuritis, unspecified: Secondary | ICD-10-CM | POA: Diagnosis not present

## 2023-07-27 ENCOUNTER — Encounter: Payer: Self-pay | Admitting: Adult Health

## 2023-07-29 DIAGNOSIS — N189 Chronic kidney disease, unspecified: Secondary | ICD-10-CM | POA: Diagnosis not present

## 2023-07-29 DIAGNOSIS — Z79899 Other long term (current) drug therapy: Secondary | ICD-10-CM | POA: Diagnosis not present

## 2023-07-29 DIAGNOSIS — Z9989 Dependence on other enabling machines and devices: Secondary | ICD-10-CM | POA: Diagnosis not present

## 2023-07-29 DIAGNOSIS — F5105 Insomnia due to other mental disorder: Secondary | ICD-10-CM | POA: Diagnosis not present

## 2023-07-29 DIAGNOSIS — G2581 Restless legs syndrome: Secondary | ICD-10-CM | POA: Diagnosis not present

## 2023-07-29 DIAGNOSIS — Z6832 Body mass index (BMI) 32.0-32.9, adult: Secondary | ICD-10-CM | POA: Diagnosis not present

## 2023-07-29 DIAGNOSIS — M792 Neuralgia and neuritis, unspecified: Secondary | ICD-10-CM | POA: Diagnosis not present

## 2023-07-29 DIAGNOSIS — Z713 Dietary counseling and surveillance: Secondary | ICD-10-CM | POA: Diagnosis not present

## 2023-07-29 DIAGNOSIS — F331 Major depressive disorder, recurrent, moderate: Secondary | ICD-10-CM | POA: Diagnosis not present

## 2023-07-29 DIAGNOSIS — I129 Hypertensive chronic kidney disease with stage 1 through stage 4 chronic kidney disease, or unspecified chronic kidney disease: Secondary | ICD-10-CM | POA: Diagnosis not present

## 2023-07-29 DIAGNOSIS — G4700 Insomnia, unspecified: Secondary | ICD-10-CM | POA: Diagnosis not present

## 2023-08-16 DIAGNOSIS — H47012 Ischemic optic neuropathy, left eye: Secondary | ICD-10-CM | POA: Diagnosis not present

## 2023-08-16 DIAGNOSIS — H43812 Vitreous degeneration, left eye: Secondary | ICD-10-CM | POA: Diagnosis not present

## 2023-08-16 DIAGNOSIS — H35373 Puckering of macula, bilateral: Secondary | ICD-10-CM | POA: Diagnosis not present

## 2023-08-16 DIAGNOSIS — H47011 Ischemic optic neuropathy, right eye: Secondary | ICD-10-CM | POA: Diagnosis not present

## 2023-08-16 DIAGNOSIS — H35033 Hypertensive retinopathy, bilateral: Secondary | ICD-10-CM | POA: Diagnosis not present

## 2023-08-19 DIAGNOSIS — G2581 Restless legs syndrome: Secondary | ICD-10-CM | POA: Diagnosis not present

## 2023-08-19 DIAGNOSIS — Z79899 Other long term (current) drug therapy: Secondary | ICD-10-CM | POA: Diagnosis not present

## 2023-08-19 DIAGNOSIS — Z9989 Dependence on other enabling machines and devices: Secondary | ICD-10-CM | POA: Diagnosis not present

## 2023-08-19 DIAGNOSIS — Z6832 Body mass index (BMI) 32.0-32.9, adult: Secondary | ICD-10-CM | POA: Diagnosis not present

## 2023-08-19 DIAGNOSIS — G4700 Insomnia, unspecified: Secondary | ICD-10-CM | POA: Diagnosis not present

## 2023-08-19 DIAGNOSIS — F331 Major depressive disorder, recurrent, moderate: Secondary | ICD-10-CM | POA: Diagnosis not present

## 2023-08-19 DIAGNOSIS — I129 Hypertensive chronic kidney disease with stage 1 through stage 4 chronic kidney disease, or unspecified chronic kidney disease: Secondary | ICD-10-CM | POA: Diagnosis not present

## 2023-08-19 DIAGNOSIS — Z713 Dietary counseling and surveillance: Secondary | ICD-10-CM | POA: Diagnosis not present

## 2023-08-19 DIAGNOSIS — N189 Chronic kidney disease, unspecified: Secondary | ICD-10-CM | POA: Diagnosis not present

## 2023-08-19 DIAGNOSIS — F5105 Insomnia due to other mental disorder: Secondary | ICD-10-CM | POA: Diagnosis not present

## 2023-08-20 ENCOUNTER — Other Ambulatory Visit: Payer: Self-pay | Admitting: Adult Health

## 2023-08-20 DIAGNOSIS — G4733 Obstructive sleep apnea (adult) (pediatric): Secondary | ICD-10-CM

## 2023-08-23 ENCOUNTER — Ambulatory Visit (INDEPENDENT_AMBULATORY_CARE_PROVIDER_SITE_OTHER): Payer: Medicare Other

## 2023-08-23 DIAGNOSIS — G4733 Obstructive sleep apnea (adult) (pediatric): Secondary | ICD-10-CM

## 2023-09-16 ENCOUNTER — Ambulatory Visit: Payer: Medicare Other | Admitting: Primary Care

## 2023-10-29 DIAGNOSIS — N200 Calculus of kidney: Secondary | ICD-10-CM | POA: Diagnosis not present

## 2023-10-29 DIAGNOSIS — N1831 Chronic kidney disease, stage 3a: Secondary | ICD-10-CM | POA: Diagnosis not present

## 2023-11-04 DIAGNOSIS — I129 Hypertensive chronic kidney disease with stage 1 through stage 4 chronic kidney disease, or unspecified chronic kidney disease: Secondary | ICD-10-CM | POA: Diagnosis not present

## 2023-11-04 DIAGNOSIS — N200 Calculus of kidney: Secondary | ICD-10-CM | POA: Diagnosis not present

## 2023-11-04 DIAGNOSIS — N1831 Chronic kidney disease, stage 3a: Secondary | ICD-10-CM | POA: Diagnosis not present

## 2023-11-04 DIAGNOSIS — G4733 Obstructive sleep apnea (adult) (pediatric): Secondary | ICD-10-CM | POA: Diagnosis not present

## 2023-11-09 DIAGNOSIS — M1711 Unilateral primary osteoarthritis, right knee: Secondary | ICD-10-CM | POA: Diagnosis not present

## 2023-11-09 DIAGNOSIS — M25561 Pain in right knee: Secondary | ICD-10-CM | POA: Diagnosis not present

## 2023-11-10 DIAGNOSIS — Z789 Other specified health status: Secondary | ICD-10-CM | POA: Diagnosis not present

## 2023-11-10 DIAGNOSIS — L538 Other specified erythematous conditions: Secondary | ICD-10-CM | POA: Diagnosis not present

## 2023-11-10 DIAGNOSIS — L820 Inflamed seborrheic keratosis: Secondary | ICD-10-CM | POA: Diagnosis not present

## 2023-11-10 DIAGNOSIS — R208 Other disturbances of skin sensation: Secondary | ICD-10-CM | POA: Diagnosis not present

## 2023-11-10 DIAGNOSIS — L2989 Other pruritus: Secondary | ICD-10-CM | POA: Diagnosis not present

## 2023-11-12 DIAGNOSIS — I129 Hypertensive chronic kidney disease with stage 1 through stage 4 chronic kidney disease, or unspecified chronic kidney disease: Secondary | ICD-10-CM | POA: Diagnosis not present

## 2023-11-12 DIAGNOSIS — E782 Mixed hyperlipidemia: Secondary | ICD-10-CM | POA: Diagnosis not present

## 2023-11-12 DIAGNOSIS — Z125 Encounter for screening for malignant neoplasm of prostate: Secondary | ICD-10-CM | POA: Diagnosis not present

## 2023-11-17 DIAGNOSIS — F331 Major depressive disorder, recurrent, moderate: Secondary | ICD-10-CM | POA: Diagnosis not present

## 2023-11-17 DIAGNOSIS — G2581 Restless legs syndrome: Secondary | ICD-10-CM | POA: Diagnosis not present

## 2023-11-17 DIAGNOSIS — S68619D Complete traumatic transphalangeal amputation of unspecified finger, subsequent encounter: Secondary | ICD-10-CM | POA: Diagnosis not present

## 2023-11-17 DIAGNOSIS — G4700 Insomnia, unspecified: Secondary | ICD-10-CM | POA: Diagnosis not present

## 2023-11-17 DIAGNOSIS — M792 Neuralgia and neuritis, unspecified: Secondary | ICD-10-CM | POA: Diagnosis not present

## 2023-11-17 DIAGNOSIS — E782 Mixed hyperlipidemia: Secondary | ICD-10-CM | POA: Diagnosis not present

## 2023-11-17 DIAGNOSIS — M1711 Unilateral primary osteoarthritis, right knee: Secondary | ICD-10-CM | POA: Diagnosis not present

## 2023-11-17 DIAGNOSIS — I129 Hypertensive chronic kidney disease with stage 1 through stage 4 chronic kidney disease, or unspecified chronic kidney disease: Secondary | ICD-10-CM | POA: Diagnosis not present

## 2023-11-17 DIAGNOSIS — D638 Anemia in other chronic diseases classified elsewhere: Secondary | ICD-10-CM | POA: Diagnosis not present

## 2023-11-17 DIAGNOSIS — G4733 Obstructive sleep apnea (adult) (pediatric): Secondary | ICD-10-CM | POA: Diagnosis not present

## 2023-11-17 DIAGNOSIS — F5105 Insomnia due to other mental disorder: Secondary | ICD-10-CM | POA: Diagnosis not present

## 2023-11-17 DIAGNOSIS — N1831 Chronic kidney disease, stage 3a: Secondary | ICD-10-CM | POA: Diagnosis not present

## 2023-12-03 ENCOUNTER — Other Ambulatory Visit: Payer: Self-pay

## 2023-12-03 ENCOUNTER — Encounter (HOSPITAL_COMMUNITY): Payer: Self-pay | Admitting: *Deleted

## 2023-12-03 ENCOUNTER — Ambulatory Visit
Admission: EM | Admit: 2023-12-03 | Discharge: 2023-12-03 | Disposition: A | Discharge status: Home / Self Care | Payer: Medicare Other | Attending: Family Medicine | Admitting: Family Medicine

## 2023-12-03 ENCOUNTER — Emergency Department (HOSPITAL_COMMUNITY): Payer: Medicare Other

## 2023-12-03 ENCOUNTER — Inpatient Hospital Stay (HOSPITAL_COMMUNITY)
Admission: EM | Admit: 2023-12-03 | Discharge: 2023-12-07 | DRG: 322 | Disposition: A | Discharge status: Home / Self Care | Payer: Medicare Other | Attending: Student | Admitting: Student

## 2023-12-03 DIAGNOSIS — E669 Obesity, unspecified: Secondary | ICD-10-CM | POA: Diagnosis present

## 2023-12-03 DIAGNOSIS — Z8052 Family history of malignant neoplasm of bladder: Secondary | ICD-10-CM | POA: Diagnosis not present

## 2023-12-03 DIAGNOSIS — Z905 Acquired absence of kidney: Secondary | ICD-10-CM

## 2023-12-03 DIAGNOSIS — N1831 Chronic kidney disease, stage 3a: Secondary | ICD-10-CM | POA: Diagnosis not present

## 2023-12-03 DIAGNOSIS — I10 Essential (primary) hypertension: Secondary | ICD-10-CM | POA: Diagnosis present

## 2023-12-03 DIAGNOSIS — Z96652 Presence of left artificial knee joint: Secondary | ICD-10-CM | POA: Diagnosis present

## 2023-12-03 DIAGNOSIS — G4733 Obstructive sleep apnea (adult) (pediatric): Secondary | ICD-10-CM | POA: Diagnosis present

## 2023-12-03 DIAGNOSIS — Z955 Presence of coronary angioplasty implant and graft: Secondary | ICD-10-CM

## 2023-12-03 DIAGNOSIS — Z6830 Body mass index (BMI) 30.0-30.9, adult: Secondary | ICD-10-CM | POA: Diagnosis not present

## 2023-12-03 DIAGNOSIS — R112 Nausea with vomiting, unspecified: Secondary | ICD-10-CM | POA: Diagnosis not present

## 2023-12-03 DIAGNOSIS — I77810 Thoracic aortic ectasia: Secondary | ICD-10-CM | POA: Diagnosis not present

## 2023-12-03 DIAGNOSIS — Z801 Family history of malignant neoplasm of trachea, bronchus and lung: Secondary | ICD-10-CM | POA: Diagnosis not present

## 2023-12-03 DIAGNOSIS — E785 Hyperlipidemia, unspecified: Secondary | ICD-10-CM | POA: Diagnosis present

## 2023-12-03 DIAGNOSIS — Z79899 Other long term (current) drug therapy: Secondary | ICD-10-CM

## 2023-12-03 DIAGNOSIS — Z87442 Personal history of urinary calculi: Secondary | ICD-10-CM | POA: Diagnosis not present

## 2023-12-03 DIAGNOSIS — J111 Influenza due to unidentified influenza virus with other respiratory manifestations: Secondary | ICD-10-CM | POA: Diagnosis not present

## 2023-12-03 DIAGNOSIS — I214 Non-ST elevation (NSTEMI) myocardial infarction: Principal | ICD-10-CM

## 2023-12-03 DIAGNOSIS — R7989 Other specified abnormal findings of blood chemistry: Secondary | ICD-10-CM

## 2023-12-03 DIAGNOSIS — I361 Nonrheumatic tricuspid (valve) insufficiency: Secondary | ICD-10-CM | POA: Diagnosis present

## 2023-12-03 DIAGNOSIS — Z888 Allergy status to other drugs, medicaments and biological substances status: Secondary | ICD-10-CM

## 2023-12-03 DIAGNOSIS — Z7982 Long term (current) use of aspirin: Secondary | ICD-10-CM

## 2023-12-03 DIAGNOSIS — Z87891 Personal history of nicotine dependence: Secondary | ICD-10-CM | POA: Diagnosis not present

## 2023-12-03 DIAGNOSIS — I252 Old myocardial infarction: Secondary | ICD-10-CM | POA: Diagnosis not present

## 2023-12-03 DIAGNOSIS — I129 Hypertensive chronic kidney disease with stage 1 through stage 4 chronic kidney disease, or unspecified chronic kidney disease: Secondary | ICD-10-CM | POA: Diagnosis present

## 2023-12-03 DIAGNOSIS — F419 Anxiety disorder, unspecified: Secondary | ICD-10-CM | POA: Diagnosis present

## 2023-12-03 DIAGNOSIS — J101 Influenza due to other identified influenza virus with other respiratory manifestations: Secondary | ICD-10-CM

## 2023-12-03 DIAGNOSIS — R079 Chest pain, unspecified: Secondary | ICD-10-CM

## 2023-12-03 DIAGNOSIS — F32A Depression, unspecified: Secondary | ICD-10-CM | POA: Diagnosis present

## 2023-12-03 DIAGNOSIS — E782 Mixed hyperlipidemia: Secondary | ICD-10-CM

## 2023-12-03 DIAGNOSIS — G2581 Restless legs syndrome: Secondary | ICD-10-CM | POA: Diagnosis present

## 2023-12-03 DIAGNOSIS — N183 Chronic kidney disease, stage 3 unspecified: Secondary | ICD-10-CM | POA: Insufficient documentation

## 2023-12-03 DIAGNOSIS — J40 Bronchitis, not specified as acute or chronic: Secondary | ICD-10-CM | POA: Diagnosis not present

## 2023-12-03 DIAGNOSIS — I2510 Atherosclerotic heart disease of native coronary artery without angina pectoris: Secondary | ICD-10-CM | POA: Diagnosis not present

## 2023-12-03 DIAGNOSIS — Z886 Allergy status to analgesic agent status: Secondary | ICD-10-CM

## 2023-12-03 DIAGNOSIS — Z6832 Body mass index (BMI) 32.0-32.9, adult: Secondary | ICD-10-CM

## 2023-12-03 LAB — RESP PANEL BY RT-PCR (RSV, FLU A&B, COVID)  RVPGX2
Influenza A by PCR: POSITIVE — AB
Influenza B by PCR: NEGATIVE
Resp Syncytial Virus by PCR: NEGATIVE
SARS Coronavirus 2 by RT PCR: NEGATIVE

## 2023-12-03 LAB — CBC
HCT: 37.9 % — ABNORMAL LOW (ref 39.0–52.0)
Hemoglobin: 12.8 g/dL — ABNORMAL LOW (ref 13.0–17.0)
MCH: 30.7 pg (ref 26.0–34.0)
MCHC: 33.8 g/dL (ref 30.0–36.0)
MCV: 90.9 fL (ref 80.0–100.0)
Platelets: 186 10*3/uL (ref 150–400)
RBC: 4.17 MIL/uL — ABNORMAL LOW (ref 4.22–5.81)
RDW: 13 % (ref 11.5–15.5)
WBC: 5.6 10*3/uL (ref 4.0–10.5)
nRBC: 0 % (ref 0.0–0.2)

## 2023-12-03 LAB — BASIC METABOLIC PANEL
Anion gap: 10 (ref 5–15)
BUN: 21 mg/dL (ref 8–23)
CO2: 27 mmol/L (ref 22–32)
Calcium: 9.3 mg/dL (ref 8.9–10.3)
Chloride: 99 mmol/L (ref 98–111)
Creatinine, Ser: 1.46 mg/dL — ABNORMAL HIGH (ref 0.61–1.24)
GFR, Estimated: 50 mL/min — ABNORMAL LOW (ref >60)
Glucose, Bld: 98 mg/dL (ref 70–99)
Potassium: 3.4 mmol/L — ABNORMAL LOW (ref 3.5–5.1)
Sodium: 136 mmol/L (ref 135–145)

## 2023-12-03 LAB — TROPONIN I (HIGH SENSITIVITY)
Troponin I (High Sensitivity): 309 ng/L (ref <18)
Troponin I (High Sensitivity): 2633 ng/L (ref <18)

## 2023-12-03 MED ORDER — HEPARIN (PORCINE) 25000 UT/250ML-% IV SOLN
1200.0000 [IU]/h | INTRAVENOUS | Status: DC
Start: 2023-12-03 — End: 2023-12-06
  Administered 2023-12-03 – 2023-12-05 (×3): 1200 [IU]/h via INTRAVENOUS
  Filled 2023-12-03 (×4): qty 250

## 2023-12-03 MED ORDER — ONDANSETRON 4 MG PO TBDP
4.0000 mg | ORAL_TABLET | Freq: Once | ORAL | Status: AC
  Administered 2023-12-03: 4 mg via ORAL

## 2023-12-03 MED ORDER — NITROGLYCERIN 0.4 MG SL SUBL
0.4000 mg | SUBLINGUAL_TABLET | Freq: Once | SUBLINGUAL | Status: AC
  Administered 2023-12-03: 0.4 mg via SUBLINGUAL
  Filled 2023-12-03: qty 1

## 2023-12-03 MED ORDER — MORPHINE SULFATE (PF) 4 MG/ML IV SOLN
4.0000 mg | Freq: Once | INTRAVENOUS | Status: AC
Start: 2023-12-03 — End: 2023-12-03
  Administered 2023-12-03: 4 mg via INTRAVENOUS
  Filled 2023-12-03: qty 1

## 2023-12-03 MED ORDER — ASPIRIN 81 MG PO CHEW
324.0000 mg | CHEWABLE_TABLET | Freq: Once | ORAL | Status: AC
  Administered 2023-12-03: 324 mg via ORAL
  Filled 2023-12-03: qty 4

## 2023-12-03 MED ORDER — HEPARIN BOLUS VIA INFUSION
4000.0000 [IU] | Freq: Once | INTRAVENOUS | Status: AC
  Administered 2023-12-03: 4000 [IU] via INTRAVENOUS
  Filled 2023-12-03: qty 4000

## 2023-12-03 NOTE — ED Provider Notes (Signed)
 [facility]    CSN: [id number] Arrival date & time: 12/03/23  1709      History   Chief Complaint Chief Complaint  Patient presents with   Chest Pain    HPI [name] is a 73 y.o. male.   Patient presenting today with new onset severe mid chest pain extending to his back that started around lunchtime today.  He states he started with bronchitis type symptoms yesterday and was seen this morning at a different urgent care, given doxycycline  and Tessalon  for bronchitis.  He states his COVID and flu testing at that visit was negative.  Chest x-ray was not performed.  He states he has since developed these new symptoms that feel unrelated, and then upon arriving in the triage room had sudden severe nausea and vomiting.  Not try anything currently for the symptoms.  He does have a history of noncardiac chest pain, dyspnea on exertion.    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   Sleep apnea    uses Cpap nightly    Patient Active Problem List   Diagnosis Date Noted   S/P left unicompartmental knee replacement 09/16/2021   Chest pain, non-cardiac 10/11/2019   DOE (dyspnea on exertion) 10/11/2019   Abnormal heart sounds 10/11/2019   Pain in left knee 09/20/2019   Iron deficiency anemia 09/07/2019   Pain in thumb joint with movement of left hand 05/24/2019   Metabolic bone disease 04/25/2019   Renal stone 04/25/2019   Pain of left hand 03/23/2019   Insomnia 09/14/2018   S/P right knee arthroscopy 04/14/18    Absolute anemia 04/05/2017   Restless leg syndrome 12/07/2016   Numbness 12/07/2016   OSA (obstructive sleep apnea) 12/07/2016   Spinal stenosis of cervical region 12/07/2016   Spinal stenosis of lumbar  region 06/18/2014   CTS (carpal tunnel syndrome) 12/20/2012   Medial meniscus, posterior horn derangement 12/20/2012   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   Stage 3 chronic kidney disease (HCC) 11/22/2011   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   Essential (primary) hypertension 11/22/2011    Past Surgical History:  Procedure Laterality Date   BACK SURGERY     lumbar 2020   CATARACT EXTRACTION     10-15 years ago   COLONOSCOPY  05/25/2012   Procedure: COLONOSCOPY;  Surgeon: Claudis RAYMOND Rivet, MD;  Location: [facility];  Service: Endoscopy;  Laterality: N/A;  1030   COLONOSCOPY N/A 05/14/2017   Procedure: COLONOSCOPY;  Surgeon: Rivet Claudis RAYMOND, MD;  Location: [facility];  Service: Endoscopy;  Laterality: N/A;  10:10   CYSTOSCOPY/RETROGRADE/URETEROSCOPY Right 06/07/2014   Procedure: CYSTOSCOPY, RIGHT RETROGRADE, RIGHT URETEROSCOPY, RIGHT URETERAL BALLOON DILATATION, BIOPSY, RIGHT URETERAL STENT;  Surgeon: Garnette CHRISTELLA Shack, MD;  [facility];  Service: Urology;  Laterality: Right;   DRUG INDUCED ENDOSCOPY Right 07/21/2022   Procedure: DRUG INDUCED ENDOSCOPY;  Surgeon: Carlie Clark, MD;  Location: [facility];  Service: ENT;  Laterality: Right;   I & D EXTREMITY Left 05/01/2021   Procedure: LEFT INDEX AND LONG FINGER AMPUTATION AND RING FINGER NAIL BED REPAIR.;  Surgeon: Murrell Drivers, MD;  Location: [facility];  Service: Orthopedics;  Laterality: Left;  LEFT INDEX AND LONG FINGER AMPUTATION AND RING FINGER NAIL BED REPAIR.   IMPLANTATION OF HYPOGLOSSAL NERVE STIMULATOR Right 09/09/2022   Procedure: IMPLANTATION OF HYPOGLOSSAL NERVE STIMULATOR;  Surgeon: Carlie Clark, MD;  Location: [facility];  Service: ENT;  Laterality: Right;   kidney stone  1983   Had partial right nephrectomy   KNEE ARTHROSCOPY WITH MEDIAL MENISECTOMY Right 04/14/2018   Procedure: KNEE ARTHROSCOPY WITH MEDIAL MENISECTOMY;  Surgeon: Margrette Taft BRAVO, MD;  Location:  [facility];  Service: Orthopedics;  Laterality: Right;   left foot surgery  2010   repair of joint   PARTIAL KNEE ARTHROPLASTY Left 09/16/2021   Procedure: UNICOMPARTMENTAL KNEE MEDIALLY;  Surgeon: Ernie Cough, MD;  Location: [facility];  Service: Orthopedics;  Laterality: Left;   WRIST SURGERY Bilateral        Home Medications    Prior to Admission medications   Medication Sig Start Date End Date Taking? Authorizing Provider  DULoxetine  (CYMBALTA ) 30 MG capsule Take 30 mg by mouth 2 (two) times daily.    [provider]  eszopiclone (LUNESTA) 2 MG TABS tablet Take 2 mg by mouth at bedtime as needed.    [provider]  Gabapentin  Enacarbil 600 MG TBCR Take 1 tablet (600 mg total) by mouth at bedtime. 03/18/23   Olalere, Jennet A, MD  hydrochlorothiazide (MICROZIDE) 12.5 MG capsule Take 12.5 mg by mouth every morning.     [provider]  pramipexole  (MIRAPEX ) 0.5 MG tablet TAKE 1 TABLET AT DINNER AND 1 TABLET AT BEDTIME. 03/11/23   [name], Charlie LABOR, MD  QUEtiapine (SEROQUEL) 50 MG tablet Take 50 mg by mouth at bedtime. 01/26/20   [provider]  traMADol  (ULTRAM ) 50 MG tablet Take 50 mg by mouth 2 (two) times daily as needed. 02/11/22   [provider]    Family History Family History  Problem Relation Age of Onset   Cancer Father        Bladder cancer   Lung cancer Brother    Healthy Mother    Healthy Brother    Healthy Brother     Social History Social History   Tobacco Use   Smoking status: Former    Types: Cigars    Passive exposure: Never   Smokeless tobacco: Never  Vaping Use   Vaping status: Never Used  Substance Use Topics   Alcohol use: Yes    Comment: rare   Drug use: No     Allergies   Nsaids   Review of Systems Review of Systems Per HPI  Physical Exam Triage Vital Signs ED Triage Vitals  Encounter Vitals Group     BP 12/03/23 1715 134/75     Systolic BP Percentile --      Diastolic BP Percentile --       Pulse Rate 12/03/23 1715 73     Resp 12/03/23 1715 18     Temp 12/03/23 1715 98.8 F (37.1 C)     Temp src --      SpO2 12/03/23 1715 96 %     Weight --      Height --      Head Circumference --      Peak Flow --      Pain Score 12/03/23 1722 6     Pain Loc --      Pain Education --      Exclude from Growth Chart --    No data  found.  Updated Vital Signs BP 134/75 (BP Location: Right Arm)   Pulse 73   Temp 98.8 F (37.1 C)   Resp 18   SpO2 96%   Visual Acuity Right Eye Distance:   Left Eye Distance:   Bilateral Distance:    Right Eye Near:   Left Eye Near:    Bilateral Near:      Physical exam abbreviated today as decision was made prior to go to the emergency department for further evaluation Physical Exam Vitals and nursing note reviewed.  Constitutional:      Appearance: He is well-developed.  HENT:     Head: Atraumatic.  Eyes:     Conjunctiva/sclera: Conjunctivae normal.  Cardiovascular:     Rate and Rhythm: Normal rate.  Pulmonary:     Effort: Pulmonary effort is normal.  Musculoskeletal:        General: Normal range of motion.     Cervical back: Normal range of motion and neck supple.  Skin:    General: Skin is warm and dry.  Neurological:     Mental Status: He is alert and oriented to person, place, and time.  Psychiatric:        Behavior: Behavior normal.      UC Treatments / Results  Labs (all labs ordered are listed, but only abnormal results are displayed) Labs Reviewed - No data to display  EKG   Radiology No results found.  Procedures Procedures (including critical care time)  Medications Ordered in UC Medications  ondansetron  (ZOFRAN -ODT) disintegrating tablet 4 mg (4 mg Oral Given 12/03/23 1725)    Initial Impression / Assessment and Plan / UC Course  I have reviewed the triage vital signs and the nursing notes.  Pertinent labs & imaging results that were available during my care of the patient were reviewed by me and  considered in my medical decision making (see chart for details).     Vital signs within normal limits today, however patient pale, diaphoretic, and complaining of severe chest pain shooting through to his back.  While in triage he began having intractable vomiting and layed down on the floor in the triage room secondary to this.  His EKG today shows sinus rhythm with PVCs at 78 bpm, nonspecific ST changes but no obvious ST elevations.  Discussed given severity and sudden onset of symptoms that he would benefit from further evaluation and rule out on emergency department.  He and his wife are agreeable and she wishes to take him via private vehicle.  They declined EMS transport today.  Was given prior to discharge given his new onset nausea and vomiting  Final Clinical Impressions(s) / UC Diagnoses   Final diagnoses:  Chest pain, unspecified type  Nausea and vomiting, unspecified vomiting type   Discharge Instructions   None    ED Prescriptions   None    PDMP not reviewed this encounter.   Stuart Millman [place], [place] 12/03/23 1801

## 2023-12-03 NOTE — ED Triage Notes (Signed)
 Pt with mid CP since lunch time. + N/V  Pt sent from UC to here.

## 2023-12-03 NOTE — ED Notes (Signed)
 Patient is being discharged from the Urgent Care and sent to the Emergency Department via POV . Per provider, patient is in need of higher level of care due to chest pain. Patient is aware and verbalizes understanding of plan of care.  Vitals:   12/03/23 1715  BP: 134/75  Pulse: 73  Resp: 18  Temp: 98.8 F (37.1 C)  SpO2: 96%

## 2023-12-03 NOTE — ED Notes (Addendum)
 Pt went to UC this morning got dxs of bronchitis and now states he has severe pain in the middle of his sharp chest that goes through to his back since this morning.    Pt states he is nauseas

## 2023-12-03 NOTE — ED Notes (Signed)
 Date and time results received: 12/03/23 2143   Test: Troponin Critical Value: 2633  Name of Provider Notified: MD Val Garin  Orders Received? Or Actions Taken?:

## 2023-12-03 NOTE — ED Provider Notes (Signed)
 [place] [facility] AT [facility] Provider Note   CSN: [id number] Arrival date & time: 12/03/23  1743     History  Chief Complaint  Patient presents with   Chest Pain    [name] is a 73 y.o. male.   Chest Pain Patient presents with pain bronchitis and feeling bad.  Has been feeling bad for around 2 to 3 days.  Pressure is anterior chest.  Decreased activity.  Went to urgent care and was sent here.  Decreased activity.  Also states he has had his sleeping medicine changed has had recent difficulty sleeping.  Has had muscle aches all over.  Feels as if he has bronchitis.    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   Sleep apnea    uses Cpap nightly    Home Medications Prior to Admission medications   Medication Sig Start Date End Date Taking? Authorizing Provider  DULoxetine  (CYMBALTA ) 30 MG capsule Take 30 mg by mouth 2 (two) times daily.    [provider]  eszopiclone (LUNESTA) 2 MG TABS tablet Take 2 mg by mouth at bedtime as needed.    [provider]  Gabapentin  Enacarbil 600 MG TBCR Take 1 tablet (600 mg total) by mouth at bedtime. 03/18/23   Olalere, Jennet A, MD  hydrochlorothiazide (MICROZIDE) 12.5 MG capsule Take 12.5 mg by mouth every morning.     [provider]  pramipexole  (MIRAPEX ) 0.5 MG tablet TAKE 1 TABLET AT DINNER AND 1 TABLET AT BEDTIME. 03/11/23   [name], Charlie LABOR, MD  QUEtiapine (SEROQUEL) 50 MG tablet Take 50 mg by mouth at bedtime. 01/26/20   [provider]  traMADol  (ULTRAM ) 50 MG tablet Take 50 mg by mouth 2 (two) times daily as needed. 02/11/22   [provider]      Allergies    Nsaids    Review of Systems   Review of  Systems  Cardiovascular:  Positive for chest pain.    Physical Exam Updated Vital Signs BP 134/69   Pulse 77   Temp 99.7 F (37.6 C) (Oral)   Resp 10   Ht 5' 8 (1.727 m)   Wt 95.9 kg   SpO2 98%   BMI 32.15 kg/m  Physical Exam Vitals and nursing note reviewed.  Cardiovascular:     Rate and Rhythm: Normal rate and regular rhythm.  Pulmonary:     Breath sounds: No wheezing or rhonchi.  Chest:     Chest wall: Tenderness present.     Comments: Tenderness to anterior chest wall. Abdominal:     Tenderness: There is no abdominal tenderness.  Musculoskeletal:     Right lower leg: No edema.     Left lower leg: No edema.  Skin:    General: Skin is warm.     Capillary Refill: Capillary refill takes less than 2 seconds.  Neurological:     Mental Status: He is alert and oriented to person, place, and time.     ED Results / Procedures / Treatments   Labs (all labs ordered are listed, but only abnormal results are displayed) Labs Reviewed  RESP PANEL BY RT-PCR (RSV, FLU  A&B, COVID)  RVPGX2 - Abnormal; Notable for the following components:      Result Value   Influenza A by PCR POSITIVE (*)    All other components within normal limits  BASIC METABOLIC PANEL - Abnormal; Notable for the following components:   Potassium 3.4 (*)    Creatinine, Ser 1.46 (*)    GFR, Estimated 50 (*)    All other components within normal limits  CBC - Abnormal; Notable for the following components:   RBC 4.17 (*)    Hemoglobin 12.8 (*)    HCT 37.9 (*)    All other components within normal limits  TROPONIN I (HIGH SENSITIVITY) - Abnormal; Notable for the following components:   Troponin I (High Sensitivity) 309 (*)    All other components within normal limits  TROPONIN I (HIGH SENSITIVITY) - Abnormal; Notable for the following components:   Troponin I (High Sensitivity) 2,633 (*)    All other components within normal limits    EKG EKG Interpretation Date/Time:  Friday December 03 2023  20:17:50 EST Ventricular Rate:  84 PR Interval:  131 QRS Duration:  85 QT Interval:  362 QTC Calculation: 428 R Axis:   32  Text Interpretation: Sinus rhythm Confirmed by Patsey Lot [phone]) on 12/03/2023 8:23:05 PM  Radiology DG Chest 2 View Result Date: 12/03/2023 CLINICAL DATA:  Chest pain EXAM: CHEST - 2 VIEW COMPARISON:  Chest x-ray 09/09/2022 FINDINGS: Generator overlies the right chest. The heart size and mediastinal contours are within normal limits. Both lungs are clear. There is a healed or healing posterior right seventh rib fracture. IMPRESSION: 1. No active cardiopulmonary disease. 2. Healed or healing posterior right seventh rib fracture. Electronically Signed   By: Greig Pique M.D.   On: 12/03/2023 18:51    Procedures Procedures    Medications Ordered in ED Medications  aspirin  chewable tablet 324 mg (has no administration in time range)  morphine  (PF) 4 MG/ML injection 4 mg (has no administration in time range)  nitroGLYCERIN  (NITROSTAT ) SL tablet 0.4 mg (has no administration in time range)    ED Course/ Medical Decision Making/ A&P                                 Medical Decision Making Amount and/or Complexity of Data Reviewed Labs: ordered. Radiology: ordered.  Risk OTC drugs. Prescription drug management.   Patient with anterior chest pain.  Cough.  Differential diagnosis includes URI symptoms pneumonia viral syndrome.  But also with chest pain coronary artery disease/ACS.  EKG at urgent care did have some inferior and anterior ST depression that has resolved.  Troponin now elevated at 300.  Does have continued anterior pain.  Discussed with Dr. Wonda.  He reviewed EKGs and discussed patient with me.  Pain is reproducible with palpation on the anterior chest.  Has felt myalgias also.  Could be due to influenza.  Not hypoxic.  Will check delta troponin and see trend before aggressively treating.  Delta troponin now come back at 2600.  Still  hurting.  Will now give nitroglycerin .  Will give some morphine  aspirin  and heparin .  Will discuss with cardiology.  Will repeat EKG.  Discussed with Dr. Leonce from cardiology.  Recommends a course we are doing with aspirin  heparin  nitroglycerin .  Transfer ER to ER.  Has continued pain.  Discussed with Dr. Tonia in [place], ER.  CRITICAL CARE Performed by: Lot Patsey Total critical  care time: 40 minutes Critical care time was exclusive of separately billable procedures and treating other patients. Critical care was necessary to treat or prevent imminent or life-threatening deterioration. Critical care was time spent personally by me on the following activities: development of treatment plan with patient and/or surrogate as well as nursing, discussions with consultants, evaluation of patient's response to treatment, examination of patient, obtaining history from patient or surrogate, ordering and performing treatments and interventions, ordering and review of laboratory studies, ordering and review of radiographic studies, pulse oximetry and re-evaluation of patient's condition.           Final Clinical Impression(s) / ED Diagnoses Final diagnoses:  NSTEMI (non-ST elevated myocardial infarction) [facility])  Influenza    Rx / DC Orders ED Discharge Orders     None         Patsey Lot, MD 12/03/23 2208

## 2023-12-03 NOTE — ED Triage Notes (Addendum)
 Patient BIB [facility] from [facility] for NSTEMI. Flu + Today. Troponin 2000+ Nitroglycerin  drip started at 2228, titrated to 30mg /min at 2246.  No pain currently.

## 2023-12-04 ENCOUNTER — Inpatient Hospital Stay (HOSPITAL_COMMUNITY): Payer: Medicare Other

## 2023-12-04 DIAGNOSIS — F419 Anxiety disorder, unspecified: Secondary | ICD-10-CM | POA: Diagnosis present

## 2023-12-04 DIAGNOSIS — I214 Non-ST elevation (NSTEMI) myocardial infarction: Principal | ICD-10-CM

## 2023-12-04 DIAGNOSIS — Z6830 Body mass index (BMI) 30.0-30.9, adult: Secondary | ICD-10-CM | POA: Diagnosis not present

## 2023-12-04 DIAGNOSIS — Z6832 Body mass index (BMI) 32.0-32.9, adult: Secondary | ICD-10-CM | POA: Diagnosis not present

## 2023-12-04 DIAGNOSIS — Z87442 Personal history of urinary calculi: Secondary | ICD-10-CM | POA: Diagnosis not present

## 2023-12-04 DIAGNOSIS — R7989 Other specified abnormal findings of blood chemistry: Secondary | ICD-10-CM

## 2023-12-04 DIAGNOSIS — I2510 Atherosclerotic heart disease of native coronary artery without angina pectoris: Secondary | ICD-10-CM | POA: Diagnosis present

## 2023-12-04 DIAGNOSIS — Z79899 Other long term (current) drug therapy: Secondary | ICD-10-CM | POA: Diagnosis not present

## 2023-12-04 DIAGNOSIS — I129 Hypertensive chronic kidney disease with stage 1 through stage 4 chronic kidney disease, or unspecified chronic kidney disease: Secondary | ICD-10-CM | POA: Diagnosis present

## 2023-12-04 DIAGNOSIS — N1831 Chronic kidney disease, stage 3a: Secondary | ICD-10-CM | POA: Diagnosis present

## 2023-12-04 DIAGNOSIS — Z801 Family history of malignant neoplasm of trachea, bronchus and lung: Secondary | ICD-10-CM | POA: Diagnosis not present

## 2023-12-04 DIAGNOSIS — Z87891 Personal history of nicotine dependence: Secondary | ICD-10-CM | POA: Diagnosis not present

## 2023-12-04 DIAGNOSIS — I77810 Thoracic aortic ectasia: Secondary | ICD-10-CM | POA: Diagnosis present

## 2023-12-04 DIAGNOSIS — E785 Hyperlipidemia, unspecified: Secondary | ICD-10-CM | POA: Diagnosis present

## 2023-12-04 DIAGNOSIS — G4733 Obstructive sleep apnea (adult) (pediatric): Secondary | ICD-10-CM | POA: Diagnosis present

## 2023-12-04 DIAGNOSIS — I252 Old myocardial infarction: Secondary | ICD-10-CM | POA: Diagnosis not present

## 2023-12-04 DIAGNOSIS — E669 Obesity, unspecified: Secondary | ICD-10-CM | POA: Diagnosis present

## 2023-12-04 DIAGNOSIS — Z905 Acquired absence of kidney: Secondary | ICD-10-CM | POA: Diagnosis not present

## 2023-12-04 DIAGNOSIS — Z96652 Presence of left artificial knee joint: Secondary | ICD-10-CM | POA: Diagnosis present

## 2023-12-04 DIAGNOSIS — G2581 Restless legs syndrome: Secondary | ICD-10-CM | POA: Diagnosis present

## 2023-12-04 DIAGNOSIS — Z8052 Family history of malignant neoplasm of bladder: Secondary | ICD-10-CM | POA: Diagnosis not present

## 2023-12-04 DIAGNOSIS — Z7982 Long term (current) use of aspirin: Secondary | ICD-10-CM | POA: Diagnosis not present

## 2023-12-04 DIAGNOSIS — I361 Nonrheumatic tricuspid (valve) insufficiency: Secondary | ICD-10-CM | POA: Diagnosis present

## 2023-12-04 DIAGNOSIS — J101 Influenza due to other identified influenza virus with other respiratory manifestations: Secondary | ICD-10-CM

## 2023-12-04 DIAGNOSIS — F32A Depression, unspecified: Secondary | ICD-10-CM | POA: Diagnosis present

## 2023-12-04 LAB — CBC
HCT: 37 % — ABNORMAL LOW (ref 39.0–52.0)
Hemoglobin: 12.5 g/dL — ABNORMAL LOW (ref 13.0–17.0)
MCH: 30.5 pg (ref 26.0–34.0)
MCHC: 33.8 g/dL (ref 30.0–36.0)
MCV: 90.2 fL (ref 80.0–100.0)
Platelets: 171 10*3/uL (ref 150–400)
RBC: 4.1 MIL/uL — ABNORMAL LOW (ref 4.22–5.81)
RDW: 12.9 % (ref 11.5–15.5)
WBC: 5.6 10*3/uL (ref 4.0–10.5)
nRBC: 0 % (ref 0.0–0.2)

## 2023-12-04 LAB — ECHOCARDIOGRAM COMPLETE
Area-P 1/2: 3.96 cm2
Calc EF: 44.5 %
Height: 68 in
Radius: 0.3 cm
S' Lateral: 4.9 cm
Single Plane A2C EF: 45.5 %
Single Plane A4C EF: 45.4 %
Weight: 3322.77 [oz_av]

## 2023-12-04 LAB — COMPREHENSIVE METABOLIC PANEL
ALT: 34 U/L (ref 0–44)
AST: 161 U/L — ABNORMAL HIGH (ref 15–41)
Albumin: 3.7 g/dL (ref 3.5–5.0)
Alkaline Phosphatase: 52 U/L (ref 38–126)
Anion gap: 17 — ABNORMAL HIGH (ref 5–15)
BUN: 19 mg/dL (ref 8–23)
CO2: 23 mmol/L (ref 22–32)
Calcium: 9.4 mg/dL (ref 8.9–10.3)
Chloride: 98 mmol/L (ref 98–111)
Creatinine, Ser: 1.47 mg/dL — ABNORMAL HIGH (ref 0.61–1.24)
GFR, Estimated: 50 mL/min — ABNORMAL LOW (ref >60)
Glucose, Bld: 113 mg/dL — ABNORMAL HIGH (ref 70–99)
Potassium: 3.8 mmol/L (ref 3.5–5.1)
Sodium: 138 mmol/L (ref 135–145)
Total Bilirubin: 0.6 mg/dL (ref 0.0–1.2)
Total Protein: 6.3 g/dL — ABNORMAL LOW (ref 6.5–8.1)

## 2023-12-04 LAB — HEPARIN LEVEL (UNFRACTIONATED)
Heparin Unfractionated: 0.49 [IU]/mL (ref 0.30–0.70)
Heparin Unfractionated: 0.42 [IU]/mL (ref 0.30–0.70)

## 2023-12-04 LAB — MAGNESIUM: Magnesium: 1.8 mg/dL (ref 1.7–2.4)

## 2023-12-04 LAB — CK: Total CK: 1486 U/L — ABNORMAL HIGH (ref 49–397)

## 2023-12-04 MED ORDER — PERFLUTREN LIPID MICROSPHERE
1.0000 mL | INTRAVENOUS | Status: AC | PRN
  Administered 2023-12-04: 2 mL via INTRAVENOUS

## 2023-12-04 MED ORDER — OSELTAMIVIR PHOSPHATE 75 MG PO CAPS: 75.0000 mg | ORAL_CAPSULE | Freq: Two times a day (BID) | ORAL | Status: DC

## 2023-12-04 MED ORDER — OSELTAMIVIR PHOSPHATE 75 MG PO CAPS: 75.0000 mg | ORAL_CAPSULE | Freq: Once | ORAL | Status: DC

## 2023-12-04 MED ORDER — POTASSIUM CHLORIDE 20 MEQ PO PACK
40.0000 meq | PACK | Freq: Once | ORAL | Status: AC
  Administered 2023-12-04: 40 meq via ORAL
  Filled 2023-12-04: qty 2

## 2023-12-04 MED ORDER — ATORVASTATIN CALCIUM 40 MG PO TABS
40.0000 mg | ORAL_TABLET | Freq: Every day | ORAL | Status: DC
  Administered 2023-12-04: 40 mg via ORAL
  Filled 2023-12-04: qty 1

## 2023-12-04 MED ORDER — ENSURE ENLIVE PO LIQD
237.0000 mL | Freq: Two times a day (BID) | ORAL | Status: DC
  Administered 2023-12-06 – 2023-12-07 (×2): 237 mL via ORAL

## 2023-12-04 MED ORDER — NITROGLYCERIN IN D5W 200-5 MCG/ML-% IV SOLN
0.0000 ug/min | INTRAVENOUS | Status: DC
  Administered 2023-12-04 – 2023-12-05 (×3): 30 ug/min via INTRAVENOUS
  Filled 2023-12-04 (×2): qty 250

## 2023-12-04 MED ORDER — FAMOTIDINE IN NACL 20-0.9 MG/50ML-% IV SOLN
20.0000 mg | Freq: Once | INTRAVENOUS | Status: AC
  Administered 2023-12-04: 20 mg via INTRAVENOUS
  Filled 2023-12-04: qty 50

## 2023-12-04 MED ORDER — ASPIRIN 81 MG PO TBEC
81.0000 mg | DELAYED_RELEASE_TABLET | Freq: Every day | ORAL | Status: DC
  Administered 2023-12-04 – 2023-12-07 (×4): 81 mg via ORAL
  Filled 2023-12-04 (×4): qty 1

## 2023-12-04 MED ORDER — GABAPENTIN 300 MG PO CAPS
300.0000 mg | ORAL_CAPSULE | ORAL | Status: DC
  Administered 2023-12-04 – 2023-12-07 (×6): 300 mg via ORAL
  Filled 2023-12-04 (×6): qty 1

## 2023-12-04 MED ORDER — LACTATED RINGERS IV BOLUS
1000.0000 mL | Freq: Once | INTRAVENOUS | Status: AC
  Administered 2023-12-04: 1000 mL via INTRAVENOUS

## 2023-12-04 MED ORDER — PRAMIPEXOLE DIHYDROCHLORIDE 0.25 MG PO TABS
0.5000 mg | ORAL_TABLET | Freq: Every evening | ORAL | Status: DC | PRN
  Administered 2023-12-04: 0.5 mg via ORAL
  Filled 2023-12-04 (×2): qty 2

## 2023-12-04 MED ORDER — ONDANSETRON HCL 4 MG/2ML IJ SOLN
4.0000 mg | Freq: Four times a day (QID) | INTRAMUSCULAR | Status: DC | PRN
  Administered 2023-12-06: 4 mg via INTRAVENOUS
  Filled 2023-12-04: qty 2

## 2023-12-04 MED ORDER — OSELTAMIVIR PHOSPHATE 75 MG PO CAPS
75.0000 mg | ORAL_CAPSULE | Freq: Once | ORAL | Status: AC
  Administered 2023-12-04: 75 mg via ORAL
  Filled 2023-12-04: qty 1

## 2023-12-04 MED ORDER — BENZONATATE 100 MG PO CAPS
200.0000 mg | ORAL_CAPSULE | Freq: Three times a day (TID) | ORAL | Status: DC | PRN
  Administered 2023-12-04 – 2023-12-06 (×3): 200 mg via ORAL
  Filled 2023-12-04 (×3): qty 2

## 2023-12-04 MED ORDER — GABAPENTIN 300 MG PO CAPS
600.0000 mg | ORAL_CAPSULE | Freq: Every day | ORAL | Status: DC
  Administered 2023-12-04 – 2023-12-06 (×3): 600 mg via ORAL
  Filled 2023-12-04 (×3): qty 2

## 2023-12-04 MED ORDER — NITROGLYCERIN 0.4 MG SL SUBL: 0.4000 mg | SUBLINGUAL_TABLET | SUBLINGUAL | Status: DC | PRN

## 2023-12-04 MED ORDER — ZOLPIDEM TARTRATE 5 MG PO TABS
5.0000 mg | ORAL_TABLET | Freq: Every evening | ORAL | Status: DC | PRN
  Administered 2023-12-04 – 2023-12-06 (×3): 5 mg via ORAL
  Filled 2023-12-04 (×4): qty 1

## 2023-12-04 MED ORDER — ONDANSETRON HCL 4 MG/2ML IJ SOLN
4.0000 mg | Freq: Four times a day (QID) | INTRAMUSCULAR | Status: DC | PRN
  Administered 2023-12-04: 4 mg via INTRAVENOUS
  Filled 2023-12-04: qty 2

## 2023-12-04 MED ORDER — ACETAMINOPHEN 650 MG RE SUPP: 650.0000 mg | Freq: Four times a day (QID) | RECTAL | Status: DC | PRN

## 2023-12-04 MED ORDER — ALPRAZOLAM 0.25 MG PO TABS
0.5000 mg | ORAL_TABLET | ORAL | Status: AC
  Administered 2023-12-04: 0.5 mg via ORAL
  Filled 2023-12-04: qty 2

## 2023-12-04 MED ORDER — OSELTAMIVIR PHOSPHATE 30 MG PO CAPS: 30.0000 mg | ORAL_CAPSULE | Freq: Two times a day (BID) | ORAL | Status: DC

## 2023-12-04 MED ORDER — ACETAMINOPHEN 325 MG PO TABS
650.0000 mg | ORAL_TABLET | Freq: Four times a day (QID) | ORAL | Status: DC | PRN
Start: 2023-12-04 — End: 2023-12-07
  Administered 2023-12-04 – 2023-12-06 (×3): 650 mg via ORAL
  Filled 2023-12-04 (×3): qty 2

## 2023-12-04 MED ORDER — OSELTAMIVIR PHOSPHATE 30 MG PO CAPS
30.0000 mg | ORAL_CAPSULE | Freq: Two times a day (BID) | ORAL | Status: DC
  Administered 2023-12-04 – 2023-12-07 (×7): 30 mg via ORAL
  Filled 2023-12-04 (×7): qty 1

## 2023-12-04 MED ORDER — DULOXETINE HCL 30 MG PO CPEP
30.0000 mg | ORAL_CAPSULE | Freq: Two times a day (BID) | ORAL | Status: DC
Start: 2023-12-04 — End: 2023-12-05
  Administered 2023-12-04 – 2023-12-05 (×2): 30 mg via ORAL
  Filled 2023-12-04 (×2): qty 1

## 2023-12-04 MED ORDER — METOPROLOL SUCCINATE ER 25 MG PO TB24
12.5000 mg | ORAL_TABLET | Freq: Every day | ORAL | Status: DC
  Administered 2023-12-04 – 2023-12-07 (×4): 12.5 mg via ORAL
  Filled 2023-12-04 (×4): qty 1

## 2023-12-04 NOTE — Assessment & Plan Note (Signed)
 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 Cw.. same at this time.

## 2023-12-04 NOTE — ED Notes (Signed)
 Pt complaining of anxiety and severe restless leg. Pt states if he is not able to get anything for the anxiety that he wants to leave AMA. Goel MD made aware of pt statement. Awaiting response at this time.

## 2023-12-04 NOTE — Progress Notes (Signed)
  Carryover admission to the Day Admitter.  I discussed this case with the EDP, Olam Slocumb, PA.  Per these discussions:   This is a 73 year old male who presents as ED to ED transfer from independent [facility] for NSTEMI.  He has been experiencing 3 days of generalized myalgias, cough, followed by an episode of substernal chest pressure around noon on 12/03/2023, prompting him to present to local urgent care in [place].  From urgent care, he was sent to [facility] emergency department, with initial troponin 309, with repeat trending up to 2600.  EKG is reported as showing no evidence of STEMI.  Chest x-ray showed no evidence of acute cardiopulmonary process.  Influenza A PCR was positive.   He is subsequent symptoms of medicines ED as ED to ED transfer.   EDP at [facility] has discussed with on-call cardiology, Dr. Leonce, who has seen the patient and conveys that cardiology will formally consult and are tentatively planning to take patient for cath in the AM.   The patient is currently on heparin  drip, and has received a full dose aspirin .  I have placed an order for inpatient admission to PCU for further evaluation management of the above.  I have placed some additional preliminary admit orders via the adult multi-morbid admission order set. I have also ordered echocardiogram the morning, prn sublingual nitroglycerin , and have made the patient n.p.o. given the tentative plan for cardiac cath in the morning.  Have also ordered morning labs in the form of CMP, CBC, magnesium level.    Eva Pore, DO Hospitalist

## 2023-12-04 NOTE — Progress Notes (Signed)
 PHARMACY - ANTICOAGULATION CONSULT NOTE  Pharmacy Consult for Heparin   Indication: chest pain/ACS  Allergies  Allergen Reactions   Nsaids Other (See Comments)    Cannot take because of my kidney disease.    Patient Measurements: Height: 5' 8 (172.7 cm) Weight: 94.2 kg (207 lb 10.8 oz) IBW/kg (Calculated) : 68.4  Vital Signs: Temp: 98.4 F (36.9 C) (02/07 2316) Temp Source: Oral (02/07 2316) BP: 122/71 (02/07 2316) Pulse Rate: 91 (02/07 2316)  Labs: Recent Labs    12/03/23 1806 12/03/23 2049  HGB 12.8*  --   HCT 37.9*  --   PLT 186  --   CREATININE 1.46*  --   TROPONINIHS 309* 2,633*    Estimated Creatinine Clearance: 49.4 mL/min (A) (by C-G formula based on SCr of 1.46 mg/dL (H)).   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   Sleep apnea    uses Cpap nightly     Assessment: 73 y/o M with chest pain and rising troponin. Starting heparin . Above labs reviewed. PTA meds reviewed.   Goal of Therapy:  Heparin  level 0.3-0.7 units/ml Monitor platelets by anticoagulation protocol: Yes   Plan:  Heparin  4000 units BOLUS Start heparin  drip at 1200 units/hr Heparin  level in 8 hours Daily CBC/Heparin  level Monitor for bleeding   Lynwood Mckusick, PharmD, BCPS Clinical Pharmacist Phone: [phone]

## 2023-12-04 NOTE — Assessment & Plan Note (Addendum)
 Patient reports generalized body aches starting on or around feb 5.  Therefore patient is within window to start treatment with oseltamivir .  Especially as we cannot rule out myocarditis at this time.  Patient is not hypoxic not febrile.  I will also check a CK.

## 2023-12-04 NOTE — H&P (Addendum)
 History and Physical    Patient: [name] FMW:[id number] DOB: 02/25/1951 DOA: 12/03/2023 DOS: the patient was seen and examined on 12/04/2023 PCP: Shona Norleen PEDLAR, MD  Patient coming from: [facility]  Chief Complaint:  Chief Complaint  Patient presents with   Chest Pain   HPI: [name] is a 73 y.o. male with medical history significant of medical problems as listed below. Last coronary angiogram seems to have been about 15 years ago. Patient is pretty physically active. 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   History obtained directly from patient.  Patient was in his usual state of health till approximately 3 days ago.  At the time patient reports a new onset of generalized body aches.  He denies any fever at least that was documented.  No vomiting no diarrhea no rash on his skin.  Patient did have cough however no shortness of breath palpitation or loss of consciousness.  Yesterday patient started having sensation of chest pressure around noon time.  Without any aggravating or relieving factor.  Again no palpitation or loss of consciousness no radiation.  Patient presented initially to our [facility], ER in [place] and was found to have troponin of 309 that subsequently up trended to 2600.  Patient is transferred to [facility], ER.  Patient is s/p breakfast at this time feeling much better.  Chest discomfort has resolved.  Medical evaluation is sought  Review of Systems: As mentioned in the history of present illness. All other systems reviewed and are negative. 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   Sleep apnea    uses Cpap nightly   Past Surgical History:  Procedure Laterality Date   BACK SURGERY      lumbar 2020   CATARACT EXTRACTION     10-15 years ago   COLONOSCOPY  05/25/2012   Procedure: COLONOSCOPY;  Surgeon: Claudis RAYMOND Rivet, MD;  Location: [facility];  Service: Endoscopy;  Laterality: N/A;  1030   COLONOSCOPY N/A 05/14/2017   Procedure: COLONOSCOPY;  Surgeon: Rivet Claudis RAYMOND, MD;  Location: [facility];  Service: Endoscopy;  Laterality: N/A;  10:10   CYSTOSCOPY/RETROGRADE/URETEROSCOPY Right 06/07/2014   Procedure: CYSTOSCOPY, RIGHT RETROGRADE, RIGHT URETEROSCOPY, RIGHT URETERAL BALLOON DILATATION, BIOPSY, RIGHT URETERAL STENT;  Surgeon: Garnette CHRISTELLA Shack, MD;  Location: [facility];  Service: Urology;  Laterality: Right;   DRUG INDUCED ENDOSCOPY Right 07/21/2022   Procedure: DRUG INDUCED ENDOSCOPY;  Surgeon: Carlie Clark, MD;  Location: [facility];  Service: ENT;  Laterality: Right;   I & D EXTREMITY Left 05/01/2021   Procedure: LEFT INDEX AND LONG FINGER AMPUTATION AND RING FINGER NAIL BED REPAIR.;  Surgeon: Murrell Drivers, MD;  Location: [facility];  Service: Orthopedics;  Laterality: Left;  LEFT INDEX AND LONG FINGER AMPUTATION AND RING FINGER NAIL BED REPAIR.   IMPLANTATION OF HYPOGLOSSAL NERVE STIMULATOR Right 09/09/2022   Procedure: IMPLANTATION OF HYPOGLOSSAL NERVE STIMULATOR;  Surgeon: Carlie Clark, MD;  Location: [facility];  Service: ENT;  Laterality: Right;   kidney stone  1983   Had partial right nephrectomy   KNEE ARTHROSCOPY WITH  MEDIAL MENISECTOMY Right 04/14/2018   Procedure: KNEE ARTHROSCOPY WITH MEDIAL MENISECTOMY;  Surgeon: Margrette Taft BRAVO, MD;  Location: [facility];  Service: Orthopedics;  Laterality: Right;   left foot surgery  2010   repair of joint   PARTIAL KNEE ARTHROPLASTY Left 09/16/2021   Procedure: UNICOMPARTMENTAL KNEE MEDIALLY;  Surgeon: Ernie Cough, MD;  Location: [facility];  Service: Orthopedics;  Laterality: Left;   WRIST SURGERY Bilateral    Social History:  reports that he has quit smoking. His  smoking use included cigars. He has never been exposed to tobacco smoke. He has never used smokeless tobacco. He reports current alcohol use. He reports that he does not use drugs.  Allergies  Allergen Reactions   Nsaids Other (See Comments)    Cannot take because of my kidney disease.    Family History  Problem Relation Age of Onset   Cancer Father        Bladder cancer   Lung cancer Brother    Healthy Mother    Healthy Brother    Healthy Brother     Prior to Admission medications   Medication Sig Start Date End Date Taking? Authorizing Provider  benzonatate  (TESSALON ) 200 MG capsule Take 200 mg by mouth 3 (three) times daily as needed. 12/03/23  Yes [provider]  DULoxetine  (CYMBALTA ) 30 MG capsule Take 30 mg by mouth 2 (two) times daily.   Yes [provider]  gabapentin  (NEURONTIN ) 300 MG capsule Take 300 mg by mouth See admin instructions. One tablet in the morning and afternoon and two tablets at bedtime 11/17/23  Yes [provider]  hydrochlorothiazide (MICROZIDE) 12.5 MG capsule Take 12.5 mg by mouth every morning.    Yes [provider]  pramipexole  (MIRAPEX ) 0.5 MG tablet TAKE 1 TABLET AT DINNER AND 1 TABLET AT BEDTIME. 03/11/23  Yes [name], Charlie LABOR, MD  zolpidem  (AMBIEN  CR) 12.5 MG CR tablet Take 1 tablet by mouth at bedtime. 11/17/23  Yes [provider]  Eszopiclone 3 MG TABS Take 3 mg by mouth daily. Patient not taking: Reported on 12/04/2023    [provider]    Physical Exam: Vitals:   12/04/23 0830 12/04/23 0900 12/04/23 0930 12/04/23 1058  BP: 134/75 124/60 137/78   Pulse: 96 97 76   Resp: (!) 22 20 16    Temp:    98.7 F (37.1 C)  TempSrc:    Oral  SpO2: 98% 98% 98%   Weight:      Height:       General: Patient is alert and awake, obese appearing appears to be in no distress.  Gives a coherent account of his symptoms Respiratory exam: Bilateral intravesicular Cardiovascular exam S1-S2 normal Abdomen  soft nontender Extremities warm without edema  Data Reviewed:  Labs on Admission:  Results for orders placed or performed during the hospital encounter of 12/03/23 (from the past 24 hours)  Resp panel by RT-PCR (RSV, Flu A&B, Covid) Anterior Nasal Swab     Status: Abnormal   Collection Time: 12/03/23  5:56 PM   Specimen: Anterior Nasal Swab  Result Value Ref Range   SARS Coronavirus 2 by RT PCR NEGATIVE NEGATIVE   Influenza A by PCR POSITIVE (A) NEGATIVE   Influenza B by PCR NEGATIVE NEGATIVE   Resp Syncytial Virus by PCR NEGATIVE NEGATIVE  Basic metabolic panel     Status: Abnormal   Collection Time: 12/03/23  6:06 PM  Result Value Ref Range   Sodium 136 135 -  145 mmol/L   Potassium 3.4 (L) 3.5 - 5.1 mmol/L   Chloride 99 98 - 111 mmol/L   CO2 27 22 - 32 mmol/L   Glucose, Bld 98 70 - 99 mg/dL   BUN 21 8 - 23 mg/dL   Creatinine, Ser 8.53 (H) 0.61 - 1.24 mg/dL   Calcium  9.3 8.9 - 10.3 mg/dL   GFR, Estimated 50 (L) >60 mL/min   Anion gap 10 5 - 15  CBC     Status: Abnormal   Collection Time: 12/03/23  6:06 PM  Result Value Ref Range   WBC 5.6 4.0 - 10.5 K/uL   RBC 4.17 (L) 4.22 - 5.81 MIL/uL   Hemoglobin 12.8 (L) 13.0 - 17.0 g/dL   HCT 62.0 (L) 60.9 - 47.9 %   MCV 90.9 80.0 - 100.0 fL   MCH 30.7 26.0 - 34.0 pg   MCHC 33.8 30.0 - 36.0 g/dL   RDW 86.9 88.4 - 84.4 %   Platelets 186 150 - 400 K/uL   nRBC 0.0 0.0 - 0.2 %  Troponin I (High Sensitivity)     Status: Abnormal   Collection Time: 12/03/23  6:06 PM  Result Value Ref Range   Troponin I (High Sensitivity) 309 (HH) <18 ng/L  Troponin I (High Sensitivity)     Status: Abnormal   Collection Time: 12/03/23  8:49 PM  Result Value Ref Range   Troponin I (High Sensitivity) 2,633 (HH) <18 ng/L  CBC     Status: Abnormal   Collection Time: 12/04/23  4:49 AM  Result Value Ref Range   WBC 5.6 4.0 - 10.5 K/uL   RBC 4.10 (L) 4.22 - 5.81 MIL/uL   Hemoglobin 12.5 (L) 13.0 - 17.0 g/dL   HCT 62.9 (L) 60.9 - 47.9 %   MCV 90.2 80.0  - 100.0 fL   MCH 30.5 26.0 - 34.0 pg   MCHC 33.8 30.0 - 36.0 g/dL   RDW 87.0 88.4 - 84.4 %   Platelets 171 150 - 400 K/uL   nRBC 0.0 0.0 - 0.2 %  Comprehensive metabolic panel     Status: Abnormal   Collection Time: 12/04/23  4:49 AM  Result Value Ref Range   Sodium 138 135 - 145 mmol/L   Potassium 3.8 3.5 - 5.1 mmol/L   Chloride 98 98 - 111 mmol/L   CO2 23 22 - 32 mmol/L   Glucose, Bld 113 (H) 70 - 99 mg/dL   BUN 19 8 - 23 mg/dL   Creatinine, Ser 8.52 (H) 0.61 - 1.24 mg/dL   Calcium  9.4 8.9 - 10.3 mg/dL   Total Protein 6.3 (L) 6.5 - 8.1 g/dL   Albumin 3.7 3.5 - 5.0 g/dL   AST 838 (H) 15 - 41 U/L   ALT 34 0 - 44 U/L   Alkaline Phosphatase 52 38 - 126 U/L   Total Bilirubin 0.6 0.0 - 1.2 mg/dL   GFR, Estimated 50 (L) >60 mL/min   Anion gap 17 (H) 5 - 15  Magnesium     Status: None   Collection Time: 12/04/23  4:49 AM  Result Value Ref Range   Magnesium 1.8 1.7 - 2.4 mg/dL  Heparin  level (unfractionated)     Status: None   Collection Time: 12/04/23  7:43 AM  Result Value Ref Range   Heparin  Unfractionated 0.49 0.30 - 0.70 IU/mL   Basic Metabolic Panel: Recent Labs  Lab 12/03/23 1806 12/04/23 0449  NA 136 138  K 3.4* 3.8  CL  99 98  CO2 27 23  GLUCOSE 98 113*  BUN 21 19  CREATININE 1.46* 1.47*  CALCIUM  9.3 9.4  MG  --  1.8   Liver Function Tests: Recent Labs  Lab 12/04/23 0449  AST 161*  ALT 34  ALKPHOS 52  BILITOT 0.6  PROT 6.3*  ALBUMIN 3.7   No results for input(s): LIPASE, AMYLASE in the last 168 hours. No results for input(s): AMMONIA in the last 168 hours. CBC: Recent Labs  Lab 12/03/23 1806 12/04/23 0449  WBC 5.6 5.6  HGB 12.8* 12.5*  HCT 37.9* 37.0*  MCV 90.9 90.2  PLT 186 171   Cardiac Enzymes: Recent Labs  Lab 12/03/23 1806 12/03/23 2049  TROPONINIHS 309* 2,633*    BNP (last 3 results) No results for input(s): PROBNP in the last 8760 hours. CBG: No results for input(s): GLUCAP in the last 168 hours.  Radiological  Exams on Admission:  DG Chest 2 View Result Date: 12/03/2023 CLINICAL DATA:  Chest pain EXAM: CHEST - 2 VIEW COMPARISON:  Chest x-ray 09/09/2022 FINDINGS: Generator overlies the right chest. The heart size and mediastinal contours are within normal limits. Both lungs are clear. There is a healed or healing posterior right seventh rib fracture. IMPRESSION: 1. No active cardiopulmonary disease. 2. Healed or healing posterior right seventh rib fracture. Electronically Signed   By: Greig Pique M.D.   On: 12/03/2023 18:51      EKG: Independently reviewed.  EKG done on 5 7 at 1756, 2017, 2159, 2257 were reviewed.  The initial EKG : subtle ST depressions seen in V3 V4 V5 without any downsloping or T wave inversions.  Submillimeter ST elevation in aVR.  With submillimeter ST depression and lead II.  I did not appreciate much dynamic changes and overall I would say the changes were nonspecific.SABRA  No intake/output data recorded. Total I/O In: 365.9 [P.O.:240; I.V.:125.9] Out: -      Assessment and Plan: * NSTEMI (non-ST elevated myocardial infarction) (HCC) Trop 2633 thus far.  Patient currently chest pain-free.  Troponins still being trended.  Patient getting treatment with nitroglycerin  infusion, heparin  infusion aspirin .  I will start the patient on atorvastatin  and beta-blocker metoprolol  XL 12.5 daily check lipid panel in the morning.  Cardiac input is appreciated.  Plan for left heart cath on Monday.  Maintain the patient on telemetry till then.  Echo is pending. Hold hydrochlorothiazide as metoprolol  is uptitrated.  LFT elevation No recent baseline.  But seems to be new.  AST is 161.  In the setting of influenza A infection.  I will check a CK.  We will trend this at this time.  Influenza A Patient reports generalized body aches starting on or around feb 5.  Therefore patient is within window to start treatment with oseltamivir .  Especially as we cannot rule out myocarditis at this time.   Patient is not hypoxic not febrile.  I will also check a CK.  OSA (obstructive sleep apnea) Implanted hypoglosal nerve stimulator for OSA in situ. Cw.. same at this time.   Restless leg syndrome Patient reports pretty severe symptoms with regards to this.  Continue with pramipexole .  And patient reports using marijuana smoking at home to control symptoms.  As he says nothing else works.  At this time patient required Xanax  in the hospital to alleviate some of his symptoms.  Although they are still persistent.  I did discuss with the patient that besides cannabinoid, smoking marijuana induces additional risk from smoke  itself.  And alternative means of cannabinoid administration could avoid the risk of smoking especially heart disease.  Versus cancer. AS a bridge to come of smoking canabis, consideration of either branded vaping pens or Gummies by patient. Poor quality control of vaping liquid can cause Vitamin E acetate lung injury.  I did share that cannabinoids have their own risk regardless of smoking or not.  Including dependence, psychiatric disorders, nausea vomiting etc.  I think SCD should help. Check ferritin. C/w gabapentin  duloxetin   Given Elevated CK, I will hold off on atorvastatin  at this time.    Advance Care Planning:   Code Status: Full Code   Consults: cardiology consult apprecated.  Family Communication: per patient.  Severity of Illness: The appropriate patient status for this patient is INPATIENT. Inpatient status is judged to be reasonable and necessary in order to provide the required intensity of service to ensure the patient's safety. The patient's presenting symptoms, physical exam findings, and initial radiographic and laboratory data in the context of their chronic comorbidities is felt to place them at high risk for further clinical deterioration. Furthermore, it is not anticipated that the patient will be medically stable for discharge from the hospital within 2  midnights of admission.   * I certify that at the point of admission it is my clinical judgment that the patient will require inpatient hospital care spanning beyond 2 midnights from the point of admission due to high intensity of service, high risk for further deterioration and high frequency of surveillance required.*  Author: Jacqulyn Divine, MD 12/04/2023 11:31 AM  For on call review [email or web address].

## 2023-12-04 NOTE — Progress Notes (Signed)
 PHARMACY - ANTICOAGULATION CONSULT NOTE  Pharmacy Consult for Heparin   Indication: chest pain/ACS  Allergies  Allergen Reactions   Nsaids Other (See Comments)    Cannot take because of my kidney disease.    Patient Measurements: Height: 5' 8 (172.7 cm) Weight: 92 kg (202 lb 14.4 oz) IBW/kg (Calculated) : 68.4  Vital Signs: Temp: 99.8 F (37.7 C) (02/08 1428) Temp Source: Oral (02/08 1428) BP: 118/50 (02/08 1428) Pulse Rate: 85 (02/08 1428)  Labs: Recent Labs    12/03/23 1806 12/03/23 2049 12/04/23 0449 12/04/23 0743 12/04/23 1553  HGB 12.8*  --  12.5*  --   --   HCT 37.9*  --  37.0*  --   --   PLT 186  --  171  --   --   HEPARINUNFRC  --   --   --  0.49 0.42  CREATININE 1.46*  --  1.47*  --   --   CKTOTAL  --   --  1,486*  --   --   TROPONINIHS 309* 2,633*  --   --   --     Estimated Creatinine Clearance: 48.5 mL/min (A) (by C-G formula based on SCr of 1.47 mg/dL (H)).   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   Sleep apnea    uses Cpap nightly     Assessment: 73 y/o M with chest pain and rising troponin. Starting heparin . Above labs reviewed. PTA meds reviewed.   Heparin  level remains therapeutic at 0.42 on 1200 units/hr. Hb 12.5, plt 171.   Goal of Therapy:  Heparin  level 0.3-0.7 units/ml Monitor platelets by anticoagulation protocol: Yes   Plan:  Continue heparin  drip at 1200 units/hr Daily CBC/Heparin  level Monitor for bleeding  [employer], Pharm.D., BCPS Clinical Pharmacist  **Pharmacist phone directory can be found on [email or web address] listed under [facility].  12/04/2023 4:55 PM

## 2023-12-04 NOTE — Assessment & Plan Note (Addendum)
 Trop 2633 thus far.  Patient currently chest pain-free.  Troponins still being trended.  Patient getting treatment with nitroglycerin  infusion, heparin  infusion aspirin .  I will start the patient on atorvastatin  and beta-blocker metoprolol  XL 12.5 daily check lipid panel in the morning.  Cardiac input is appreciated.  Plan for left heart cath on Monday.  Maintain the patient on telemetry till then.  Echo is pending. Hold hydrochlorothiazide as metoprolol  is uptitrated.

## 2023-12-04 NOTE — Consult Note (Signed)
 Cardiology Consultation   Patient ID: [name] MRN: [id number]; DOB: 1950-11-20  Admit date: 12/03/2023 Date of Consult: 12/04/2023  PCP:  Shona Norleen PEDLAR, MD   [facility] Providers Cardiologist:  Alm Clay, MD        Patient Profile:   [name] is a 73 y.o. male with a hx of HTN, HLD, chronic kidney disease stage 3a who is being seen 12/04/2023 for the evaluation of NSTEMI at the request of the emergency department.  History of Present Illness:   Mr. Blitch was in his usual state of health until earlier this week when he started to develop feelings of malaise and generalized fatigue.  He developed a severe cough and became worried he could have a case of bronchitis.  He went to his PCP on the morning of 12/03/23 and was prescribed an antibiotic (believes it was amoxicillin) to help break up the congestion in his chest.  He was also prescribed tessalon  perles for cough.   Following this appointment, he had acute onset of sharp, substernal chest pain that radiated through to his back which prompted him to seek care in the emergency department.  He was found to be afebrile and hemodynamically stable.  His influenza testing was positive for flu A.  His troponin was additionally found to be positive, 309-->2633.  He was loaded with aspirin  and started on a heparin  infusion.  He was treated with nitroglycerin  and then started on an infusion.  At the time of my evaluation, he is chest pain free on nitroglycerin  infusion at 30.  He has not had any chest pain since receiving SL nitroglycerin  at the freestanding ER.  He states that he had a heart cath about 15 years ago that was clean.  Had an evaluation with a cardiologist 3-5 years ago and was given a clean bill of health.   He denies a family history of heart disease.  No tobacco use.    Past Medical History:  Diagnosis Date   Anemia    Anxiety    Arthritis    spinal stenosis   Depression    History of kidney  stones    Hyperlipidemia    Hypertension    Kidney function abnormal 1983   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   Sleep apnea    uses Cpap nightly    Past Surgical History:  Procedure Laterality Date   BACK SURGERY     lumbar 2020   CATARACT EXTRACTION     10-15 years ago   COLONOSCOPY  05/25/2012   Procedure: COLONOSCOPY;  Surgeon: Claudis RAYMOND Rivet, MD;  Location: [facility];  Service: Endoscopy;  Laterality: N/A;  1030   COLONOSCOPY N/A 05/14/2017   Procedure: COLONOSCOPY;  Surgeon: Rivet Claudis RAYMOND, MD;  Location: [facility];  Service: Endoscopy;  Laterality: N/A;  10:10   CYSTOSCOPY/RETROGRADE/URETEROSCOPY Right 06/07/2014   Procedure: CYSTOSCOPY, RIGHT RETROGRADE, RIGHT URETEROSCOPY, RIGHT URETERAL BALLOON DILATATION, BIOPSY, RIGHT URETERAL STENT;  Surgeon: Garnette CHRISTELLA Shack, MD;  Location: [facility];  Service: Urology;  Laterality: Right;   DRUG INDUCED ENDOSCOPY Right 07/21/2022   Procedure: DRUG INDUCED ENDOSCOPY;  Surgeon: Carlie Clark, MD;  Location: [facility];  Service: ENT;  Laterality: Right;   I & D EXTREMITY Left 05/01/2021   Procedure: LEFT INDEX AND LONG FINGER AMPUTATION AND RING FINGER NAIL BED REPAIR.;  Surgeon: Murrell Drivers, MD;  Location: [facility];  Service: Orthopedics;  Laterality: Left;  LEFT INDEX AND LONG FINGER AMPUTATION AND RING FINGER NAIL BED REPAIR.   IMPLANTATION OF HYPOGLOSSAL NERVE STIMULATOR Right 09/09/2022   Procedure: IMPLANTATION OF HYPOGLOSSAL NERVE STIMULATOR;  Surgeon: Carlie Clark, MD;  Location: [facility];  Service: ENT;  Laterality: Right;   kidney stone  1983   Had partial right nephrectomy   KNEE ARTHROSCOPY WITH MEDIAL MENISECTOMY Right 04/14/2018   Procedure: KNEE ARTHROSCOPY WITH MEDIAL MENISECTOMY;  Surgeon: Margrette Taft BRAVO, MD;  Location: [facility];  Service: Orthopedics;  Laterality: Right;   left foot surgery  2010   repair  of joint   PARTIAL KNEE ARTHROPLASTY Left 09/16/2021   Procedure: UNICOMPARTMENTAL KNEE MEDIALLY;  Surgeon: Ernie Cough, MD;  Location: [facility];  Service: Orthopedics;  Laterality: Left;   WRIST SURGERY Bilateral      Home Medications:  Prior to Admission medications   Medication Sig Start Date End Date Taking? Authorizing Provider  DULoxetine  (CYMBALTA ) 30 MG capsule Take 30 mg by mouth 2 (two) times daily.    [provider]  eszopiclone (LUNESTA) 2 MG TABS tablet Take 2 mg by mouth at bedtime as needed.    [provider]  Gabapentin  Enacarbil 600 MG TBCR Take 1 tablet (600 mg total) by mouth at bedtime. 03/18/23   Olalere, Jennet A, MD  hydrochlorothiazide (MICROZIDE) 12.5 MG capsule Take 12.5 mg by mouth every morning.     [provider]  pramipexole  (MIRAPEX ) 0.5 MG tablet TAKE 1 TABLET AT DINNER AND 1 TABLET AT BEDTIME. 03/11/23   [name], Charlie LABOR, MD  QUEtiapine (SEROQUEL) 50 MG tablet Take 50 mg by mouth at bedtime. 01/26/20   [provider]  traMADol  (ULTRAM ) 50 MG tablet Take 50 mg by mouth 2 (two) times daily as needed. 02/11/22   [provider]    Inpatient Medications: Scheduled Meds:  oseltamivir   75 mg Oral Once   Continuous Infusions:  heparin  1,200 Units/hr (12/03/23 2357)   PRN Meds:   Allergies:    Allergies  Allergen Reactions   Nsaids Other (See Comments)    Cannot take because of my kidney disease.    Social History:   Social History   Socioeconomic History   Marital status: Married    Spouse name: Nickolis Diel   Number of children: Not on file   Years of education: 18   Highest education level: Master's degree (e.g., MA, MS, MEng, MEd, MSW, MBA)  Occupational History   Occupation: Retired    Comment: Environmental manager for [employer]  Tobacco Use   Smoking status: Former    Types: Gaffer exposure: Never   Smokeless tobacco: Never  Vaping Use   Vaping status: Never Used  Substance and  Sexual Activity   Alcohol use: Yes    Comment: rare   Drug use: No   Sexual activity: Yes    Birth control/protection: None  Other Topics Concern   Not on file  Social History Narrative   He is married.  Has no children.  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     December 2020: He said he used to walk about 2 to 3 miles a day with his wife, but now she keeps going and he has to stop because of dyspnea.   Social Drivers of Corporate Investment Banker Strain: Low Risk  (05/25/2022)   Overall Financial Resource Strain (CARDIA)    Difficulty of Paying Living Expenses: Not hard  at all  Food Insecurity: No Food Insecurity (05/25/2022)   Hunger Vital Sign    Worried About Running Out of Food in the Last Year: Never true    Ran Out of Food in the Last Year: Never true  Transportation Needs: No Transportation Needs (05/25/2022)   PRAPARE - Administrator, Civil Service (Medical): No    Lack of Transportation (Non-Medical): No  Physical Activity: Sufficiently Active (05/25/2022)   Exercise Vital Sign    Days of Exercise per Week: 5 days    Minutes of Exercise per Session: 30 min  Stress: No Stress Concern Present (05/25/2022)   [employer] of Occupational Health - Occupational Stress Questionnaire    Feeling of Stress : Only a little  Social Connections: Socially Integrated (05/25/2022)   Social Connection and Isolation Panel [NHANES]    Frequency of Communication with Friends and Family: More than three times a week    Frequency of Social Gatherings with Friends and Family: More than three times a week    Attends Religious Services: More than 4 times per year    Active Member of [employer] or Organizations: Yes    Attends Engineer, Structural: More than 4 times per year    Marital Status: Married  Catering Manager Violence: Not At Risk (05/25/2022)   Humiliation, Afraid, Rape, and Kick questionnaire    Fear of Current or Ex-Partner: No    Emotionally Abused: No     Physically Abused: No    Sexually Abused: No    Family History:    Family History  Problem Relation Age of Onset   Cancer Father        Bladder cancer   Lung cancer Brother    Healthy Mother    Healthy Brother    Healthy Brother      ROS:  Please see the history of present illness.   All other ROS reviewed and negative.     Physical Exam/Data:   Vitals:   12/03/23 1754 12/03/23 2030 12/03/23 2100 12/03/23 2316  BP: (!) 146/93 (!) 146/101 134/69 122/71  Pulse: 83 76 77 91  Resp: 20 18 10 14   Temp: 99.7 F (37.6 C)   98.4 F (36.9 C)  TempSrc: Oral   Oral  SpO2: 99% 97% 98% 98%  Weight:      Height:       No intake or output data in the 24 hours ending 12/04/23 0137    12/03/2023    5:53 PM 07/13/2023    2:37 PM 06/08/2023    9:55 AM  Last 3 Weights  Weight (lbs) 207 lb 10.8 oz 211 lb 6.4 oz 211 lb  Weight (kg) 94.2 kg 95.89 kg 95.709 kg     Body mass index is 31.58 kg/m.  General:  Well nourished, well developed, in no acute distress HEENT: normal Neck: no JVD Vascular: No carotid bruits; Distal pulses 2+ bilaterally Cardiac:  normal S1, S2; RRR; no murmur  Lungs:  clear to auscultation bilaterally, no wheezing, rhonchi or rales  Abd: soft, nontender, no hepatomegaly  Ext: no edema Musculoskeletal:  No deformities, BUE and BLE strength normal and equal Skin: warm and dry  Neuro:  CNs 2-12 intact, no focal abnormalities noted Psych:  Normal affect   EKG:  The EKG was personally reviewed and demonstrates:  NSR, normal ECG.  No ischemic changes. Telemetry:  Telemetry was personally reviewed and demonstrates:  NSR  Relevant CV Studies: No other imaging studies  Laboratory Data:  High Sensitivity Troponin:   Recent Labs  Lab 12/03/23 1806 12/03/23 2049  TROPONINIHS 309* 2,633*     Chemistry Recent Labs  Lab 12/03/23 1806  NA 136  K 3.4*  CL 99  CO2 27  GLUCOSE 98  BUN 21  CREATININE 1.46*  CALCIUM  9.3  GFRNONAA 50*  ANIONGAP 10    No  results for input(s): PROT, ALBUMIN, AST, ALT, ALKPHOS, BILITOT in the last 168 hours. Lipids No results for input(s): CHOL, TRIG, HDL, LABVLDL, LDLCALC, CHOLHDL in the last 168 hours.  Hematology Recent Labs  Lab 12/03/23 1806  WBC 5.6  RBC 4.17*  HGB 12.8*  HCT 37.9*  MCV 90.9  MCH 30.7  MCHC 33.8  RDW 13.0  PLT 186   Thyroid  No results for input(s): TSH, FREET4 in the last 168 hours.  BNPNo results for input(s): BNP, PROBNP in the last 168 hours.  DDimer No results for input(s): DDIMER in the last 168 hours.   Radiology/Studies:  DG Chest 2 View Result Date: 12/03/2023 CLINICAL DATA:  Chest pain EXAM: CHEST - 2 VIEW COMPARISON:  Chest x-ray 09/09/2022 FINDINGS: Generator overlies the right chest. The heart size and mediastinal contours are within normal limits. Both lungs are clear. There is a healed or healing posterior right seventh rib fracture. IMPRESSION: 1. No active cardiopulmonary disease. 2. Healed or healing posterior right seventh rib fracture. Electronically Signed   By: Greig Pique M.D.   On: 12/03/2023 18:51     Assessment and Plan:   73 y.o. male with a hx of HTN, HLD, chronic kidney disease stage 3a who is being seen 12/04/2023 for the evaluation of NSTEMI at the request of the emergency department.  Given his chest pain and the progression of his symptoms, with alleviation of pain to nitroglycerin , I suspect that this is a primary ACS event.  He was found to be flu positive, which raises suspicion for a myocarditis, but chest pain is certainly more concerning for an acute plaque rupture.    NSTEMI, likely type 1 - NPO at midnight - anticipate LHC and coronary angiography for further evaluation  - trend trop to peak, can stop when downtrending  - continue nitroglycerin  infusion for control of chest pain  - s/p aspirin  load, continue 81 mg daily  - start high intensity statin with crestor  40 mg daily  - continue ACS protocol  hep infusion  - please order complete TTE  - daily weights - strict intake and output  - 2 L fluid restriction    Risk Assessment/Risk Scores:     TIMI Risk Score for Unstable Angina or Non-ST Elevation MI:   The patient's TIMI risk score is 5, which indicates a 26% risk of all cause mortality, new or recurrent myocardial infarction or need for urgent revascularization in the next 14 days.     For questions or updates, please contact [facility] Please consult [email or web address] for contact info under    Signed, Darleene JONETTA Mace, MD  12/04/2023 1:37 AM

## 2023-12-04 NOTE — ED Notes (Signed)
 Diet switched to heart healthy per cardiology MD rounding on pt. Pt provided with diet soda and sandwich bag. Message sent to pharmacy for restless leg medication that pt is requesting now.

## 2023-12-04 NOTE — ED Provider Notes (Signed)
  Cardiology has evaluated patient.  Requested medical admission.  Tentative plan for cath in the morning.  Spoke with Dr. Brock Canner-- will admit for ongoing care.   Coretha Dew, PA-C 12/04/23 0543    Lindle Rhea, MD 12/05/23 [phone]

## 2023-12-04 NOTE — Progress Notes (Signed)
  Echocardiogram 2D Echocardiogram has been performed.  Royden Corin 12/04/2023, 1:45 PM

## 2023-12-04 NOTE — Assessment & Plan Note (Signed)
 No recent baseline.  But seems to be new.  AST is 161.  In the setting of influenza A infection.  I will check a CK.  We will trend this at this time.

## 2023-12-04 NOTE — Progress Notes (Signed)
 CARDIOLOGY BRIEF NOTE  Patient seen and examined this AM. Wife at bedside. Chest pain free. Complains of restless leg syndrome. Reports nothing really helps his legs other than smoking marijuana. No shortness of breath.  GEN: No acute distress.   Neck: No JVD Cardiac: RRR Psych: Normal affect   Echo pending  #NSTEMI Hemodynamically stable. No chest pain or anginal equivalent at this time. Electrically stable. Cont aspirin  81 daily Cont heparin  gtt Cont nitroglycerin  gtt OK to eat today, LHC planned for Monday.  Ole T. Cindie, MD, [facility], [facility] Cardiac Electrophysiology

## 2023-12-04 NOTE — Assessment & Plan Note (Addendum)
 Patient reports pretty severe symptoms with regards to this.  Continue with pramipexole .  And patient reports using marijuana smoking at home to control symptoms.  As he says nothing else works.  At this time patient required Xanax  in the hospital to alleviate some of his symptoms.  Although they are still persistent.  I did discuss with the patient that besides cannabinoid, smoking marijuana induces additional risk from smoke itself.  And alternative means of cannabinoid administration could avoid the risk of smoking especially heart disease.  Versus cancer. AS a bridge to come of smoking canabis, consideration of either branded vaping pens or Gummies by patient. Poor quality control of vaping liquid can cause Vitamin E acetate lung injury.  I did share that cannabinoids have their own risk regardless of smoking or not.  Including dependence, psychiatric disorders, nausea vomiting etc.  I think SCD should help. Check ferritin. C/w gabapentin  duloxetin

## 2023-12-04 NOTE — Progress Notes (Signed)
 PHARMACY - ANTICOAGULATION CONSULT NOTE  Pharmacy Consult for Heparin   Indication: chest pain/ACS  Allergies  Allergen Reactions   Nsaids Other (See Comments)    Cannot take because of my kidney disease.    Patient Measurements: Height: 5' 8 (172.7 cm) Weight: 94.2 kg (207 lb 10.8 oz) IBW/kg (Calculated) : 68.4  Vital Signs: Temp: 99.4 F (37.4 C) (02/08 0700) Temp Source: Oral (02/08 0700) BP: 122/67 (02/08 0800) Pulse Rate: 80 (02/08 0800)  Labs: Recent Labs    12/03/23 1806 12/03/23 2049 12/04/23 0449 12/04/23 0743  HGB 12.8*  --  12.5*  --   HCT 37.9*  --  37.0*  --   PLT 186  --  171  --   HEPARINUNFRC  --   --   --  0.49  CREATININE 1.46*  --  1.47*  --   TROPONINIHS 309* 2,633*  --   --     Estimated Creatinine Clearance: 49.1 mL/min (A) (by C-G formula based on SCr of 1.47 mg/dL (H)).   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   Sleep apnea    uses Cpap nightly     Assessment: 73 y/o M with chest pain and rising troponin. Starting heparin . Above labs reviewed. PTA meds reviewed.   Heparin  level therapeutic at 0.49. Hb 12.5, plt 171.   Goal of Therapy:  Heparin  level 0.3-0.7 units/ml Monitor platelets by anticoagulation protocol: Yes   Plan:  Continue heparin  drip at 1200 units/hr Heparin  level in 8 hours Daily CBC/Heparin  level Monitor for bleeding  Rankin Sams, PharmD, BCPS, BCCCP Clinical Pharmacist

## 2023-12-05 DIAGNOSIS — R7989 Other specified abnormal findings of blood chemistry: Secondary | ICD-10-CM | POA: Diagnosis not present

## 2023-12-05 DIAGNOSIS — G4733 Obstructive sleep apnea (adult) (pediatric): Secondary | ICD-10-CM

## 2023-12-05 DIAGNOSIS — G2581 Restless legs syndrome: Secondary | ICD-10-CM

## 2023-12-05 DIAGNOSIS — J101 Influenza due to other identified influenza virus with other respiratory manifestations: Secondary | ICD-10-CM | POA: Diagnosis not present

## 2023-12-05 DIAGNOSIS — I214 Non-ST elevation (NSTEMI) myocardial infarction: Secondary | ICD-10-CM | POA: Diagnosis not present

## 2023-12-05 LAB — CBC
HCT: 33.5 % — ABNORMAL LOW (ref 39.0–52.0)
Hemoglobin: 11.3 g/dL — ABNORMAL LOW (ref 13.0–17.0)
MCH: 30.5 pg (ref 26.0–34.0)
MCHC: 33.7 g/dL (ref 30.0–36.0)
MCV: 90.3 fL (ref 80.0–100.0)
Platelets: 155 10*3/uL (ref 150–400)
RBC: 3.71 MIL/uL — ABNORMAL LOW (ref 4.22–5.81)
RDW: 13.1 % (ref 11.5–15.5)
WBC: 4.1 10*3/uL (ref 4.0–10.5)
nRBC: 0 % (ref 0.0–0.2)

## 2023-12-05 LAB — RENAL FUNCTION PANEL
Albumin: 3.2 g/dL — ABNORMAL LOW (ref 3.5–5.0)
Anion gap: 9 (ref 5–15)
BUN: 18 mg/dL (ref 8–23)
CO2: 24 mmol/L (ref 22–32)
Calcium: 8.3 mg/dL — ABNORMAL LOW (ref 8.9–10.3)
Chloride: 102 mmol/L (ref 98–111)
Creatinine, Ser: 1.54 mg/dL — ABNORMAL HIGH (ref 0.61–1.24)
GFR, Estimated: 47 mL/min — ABNORMAL LOW (ref >60)
Glucose, Bld: 100 mg/dL — ABNORMAL HIGH (ref 70–99)
Phosphorus: 2.6 mg/dL (ref 2.5–4.6)
Potassium: 3.9 mmol/L (ref 3.5–5.1)
Sodium: 135 mmol/L (ref 135–145)

## 2023-12-05 LAB — LIPID PANEL
Cholesterol: 158 mg/dL (ref 0–200)
HDL: 38 mg/dL — ABNORMAL LOW (ref >40)
LDL Cholesterol: 108 mg/dL — ABNORMAL HIGH (ref 0–99)
Total CHOL/HDL Ratio: 4.2 {ratio}
Triglycerides: 58 mg/dL (ref <150)
VLDL: 12 mg/dL (ref 0–40)

## 2023-12-05 LAB — CK: Total CK: 1063 U/L — ABNORMAL HIGH (ref 49–397)

## 2023-12-05 LAB — HEMOGLOBIN A1C
Hgb A1c MFr Bld: 5.4 % (ref 4.8–5.6)
Mean Plasma Glucose: 108.28 mg/dL

## 2023-12-05 LAB — HEPATIC FUNCTION PANEL
ALT: 43 U/L (ref 0–44)
AST: 221 U/L — ABNORMAL HIGH (ref 15–41)
Albumin: 3.3 g/dL — ABNORMAL LOW (ref 3.5–5.0)
Alkaline Phosphatase: 41 U/L (ref 38–126)
Bilirubin, Direct: <0.1 mg/dL (ref 0.0–0.2)
Total Bilirubin: 0.6 mg/dL (ref 0.0–1.2)
Total Protein: 5.7 g/dL — ABNORMAL LOW (ref 6.5–8.1)

## 2023-12-05 LAB — FERRITIN: Ferritin: 203 ng/mL (ref 24–336)

## 2023-12-05 LAB — HEPARIN LEVEL (UNFRACTIONATED): Heparin Unfractionated: 0.5 [IU]/mL (ref 0.30–0.70)

## 2023-12-05 LAB — TROPONIN I (HIGH SENSITIVITY): Troponin I (High Sensitivity): >24000 ng/L (ref <18)

## 2023-12-05 MED ORDER — PRAMIPEXOLE DIHYDROCHLORIDE 0.25 MG PO TABS
0.5000 mg | ORAL_TABLET | Freq: Once | ORAL | Status: AC
  Administered 2023-12-05: 0.5 mg via ORAL
  Filled 2023-12-05: qty 2

## 2023-12-05 MED ORDER — DULOXETINE HCL 60 MG PO CPEP
60.0000 mg | ORAL_CAPSULE | Freq: Every day | ORAL | Status: DC
  Administered 2023-12-05 – 2023-12-07 (×3): 60 mg via ORAL
  Filled 2023-12-05 (×3): qty 1

## 2023-12-05 MED ORDER — DULOXETINE HCL 30 MG PO CPEP
30.0000 mg | ORAL_CAPSULE | Freq: Every day | ORAL | Status: DC
  Administered 2023-12-06: 30 mg via ORAL
  Filled 2023-12-05: qty 1

## 2023-12-05 MED ORDER — PRAMIPEXOLE DIHYDROCHLORIDE 0.25 MG PO TABS
0.5000 mg | ORAL_TABLET | Freq: Every day | ORAL | Status: DC
  Administered 2023-12-05 – 2023-12-06 (×2): 0.5 mg via ORAL
  Filled 2023-12-05 (×2): qty 2

## 2023-12-05 NOTE — Progress Notes (Signed)
   Rounding Note    Patient Name: [name] Date of Encounter: 12/05/2023  [facility] Cardiologist: Alm Clay, MD   Subjective   NAEO. Chest pain free this AM.   Vital Signs    Vitals:   12/04/23 1428 12/04/23 2011 12/05/23 0014 12/05/23 0409  BP: (!) 118/50 113/64 125/61 122/62  Pulse: 85 79 77 92  Resp: 18 20 18 20   Temp: 99.8 F (37.7 C) 100.2 F (37.9 C) 98.8 F (37.1 C) 98.9 F (37.2 C)  TempSrc: Oral Oral Oral Oral  SpO2: 97% 99% 98% 94%  Weight:    91.6 kg  Height:        Intake/Output Summary (Last 24 hours) at 12/05/2023 0806 Last data filed at 12/05/2023 0500 Gross per 24 hour  Intake 1665.49 ml  Output 400 ml  Net 1265.49 ml      12/05/2023    4:09 AM 12/04/2023    2:23 PM 12/03/2023    5:53 PM  Last 3 Weights  Weight (lbs) 202 lb 202 lb 14.4 oz 207 lb 10.8 oz  Weight (kg) 91.627 kg 92.035 kg 94.2 kg      Telemetry    Personally Reviewed  ECG    Personally Reviewed  Physical Exam   GEN: No acute distress.   Cardiac: RRR, no murmurs, rubs, or gallops.  Respiratory: Clear to auscultation bilaterally. Psych: Normal affect   Assessment & Plan    #NSTEMI Hemodynamically stable. No chest pain or anginal equivalent at this time. Electrically stable. Cont aspirin  81 daily Cont heparin  gtt Cont nitroglycerin  gtt LHC planned for Monday.  Keep NPO after MN.    Ole T. Cindie, MD, [facility], [facility] Cardiac Electrophysiology

## 2023-12-05 NOTE — Progress Notes (Signed)
 PHARMACY - ANTICOAGULATION CONSULT NOTE  Pharmacy Consult for Heparin   Indication: chest pain/ACS  Allergies  Allergen Reactions   Nsaids Other (See Comments)    Cannot take because of my kidney disease.    Patient Measurements: Height: 5' 8 (172.7 cm) Weight: 91.6 kg (202 lb) IBW/kg (Calculated) : 68.4  Vital Signs: Temp: 99.6 F (37.6 C) (02/09 0812) Temp Source: Oral (02/09 0812) BP: 115/74 (02/09 0812) Pulse Rate: 82 (02/09 0812)  Labs: Recent Labs    12/03/23 1806 12/03/23 2049 12/04/23 0449 12/04/23 0743 12/04/23 1553 12/05/23 0403 12/05/23 0753  HGB 12.8*  --  12.5*  --   --  11.3*  --   HCT 37.9*  --  37.0*  --   --  33.5*  --   PLT 186  --  171  --   --  155  --   HEPARINUNFRC  --   --   --  0.49 0.42 0.50  --   CREATININE 1.46*  --  1.47*  --   --   --  1.54*  CKTOTAL  --   --  1,486*  --   --  1,063*  --   TROPONINIHS 309* 2,633*  --   --   --   --  >24,000*    Estimated Creatinine Clearance: 46.3 mL/min (A) (by C-G formula based on SCr of 1.54 mg/dL (H)).   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   Sleep apnea    uses Cpap nightly     Assessment: 73 y/o M with chest pain and rising troponin. Starting heparin . Above labs reviewed. PTA meds reviewed.   2/9 AM: Heparin  level 0.50, therapeutic on heparin  1200 units/hr. No issues with infusion running or signs of bleeding noted per RN. CBC stable (Hgb 11.3, PLT 155).   Goal of Therapy:  Heparin  level 0.3-0.7 units/ml Monitor platelets by anticoagulation protocol: Yes   Plan:  Continue heparin  drip at 1200 units/hr Daily CBC/Heparin  level Monitor for bleeding  Morna Breach, PharmD PGY-1 Acute Care Pharmacy  Resident 12/05/2023 9:35 AM

## 2023-12-05 NOTE — Progress Notes (Signed)
 PROGRESS NOTE  [name] FMW:[id number] DOB: 07-19-1951   PCP: Shona Norleen PEDLAR, MD  Patient is from: Home.  Very independent and runs wood workshop.  DOA: 12/03/2023 LOS: 1  Chief complaints Chief Complaint  Patient presents with   Chest Pain     Brief Narrative / Interim history: 73 year old M with PMH of HTN, HLD, OSA with inspire implant, anxiety, depression and RLS presented to [facility], ED with acute onsets chest pain/pressure for 1 day, and admitted for non-STEMI.  He also had myalgia for 3 days prior.  Influenza A PCR was positive.  Troponin elevated to 309>> 2600.  Cardiology consulted.  Patient was started on IV heparin , nitro drip and Tamiflu  and transferred to [facility] for further cardiac evaluation.  Troponin trended up to > 24K this morning.  Patient has not had further chest pain.  TTE with LVEF of 50 to 55%, RWMA, G1 DD and moderate TR.  Remains on IV heparin .  Plan for LHC on 2/10.  Cardiology following.  Subjective: Seen and examined earlier this morning.  No major events overnight of this morning.  No complaints.  He denies chest pain or dyspnea at rest or with exertion at baseline although he has been feeling fatigued over the last 6 months without acute change.  Never smoker.  Objective: Vitals:   12/04/23 2011 12/05/23 0014 12/05/23 0409 12/05/23 0812  BP: 113/64 125/61 122/62 115/74  Pulse: 79 77 92 82  Resp: 20 18 20 18   Temp: 100.2 F (37.9 C) 98.8 F (37.1 C) 98.9 F (37.2 C) 99.6 F (37.6 C)  TempSrc: Oral Oral Oral Oral  SpO2: 99% 98% 94%   Weight:   91.6 kg   Height:        Examination:  GENERAL: No apparent distress.  Nontoxic. HEENT: MMM.  Vision and hearing grossly intact.  NECK: Supple.  No apparent JVD.  RESP:  No IWOB.  Fair aeration bilaterally. CVS:  RRR. Heart sounds normal.  ABD/GI/GU: BS+. Abd soft, NTND.  MSK/EXT:  Moves extremities. No apparent deformity. No edema.  SKIN: no apparent skin lesion or wound NEURO: Awake, alert and  oriented appropriately.  No apparent focal neuro deficit. PSYCH: Calm. Normal affect.   Procedures:  None  Microbiology summarized: Influenza A PCR positive COVID-19, influenza B and RSV PCR nonreactive  Assessment and plan: Non-STEMI/chest pain: Denies prior history of chest pain at rest or exertion.  However, noted increased fatigue over the last 6 months.  He runs clinical cytogeneticist.  Troponin 309>> 2600>> greater than 24K this morning.  Has been chest pain-free since admission.  TTE with RWMA.  LDL 108.  Never smoker.  No prior history of CAD.  He reports clean cath about 15 years ago. -S/p full dose aspirin  -Continue IV heparin , nitro drip, low-dose aspirin  and metoprolol  -Plan for LHC on 2/10.  Moderate TR -Per cardiology.  Elevated AST: Likely due to CK and influenza.  -Continue monitoring   Influenza A infection: Symptomatic for 5 days -Reasonable to continue Tamiflu    OSA s/p Inspire implantation  Anxiety/depression/RLS -Continue home meds.  Obesity Body mass index is 30.71 kg/m. -Encourage lifestyle change to lose weight          DVT prophylaxis:  Place and maintain sequential compression device Start: 12/04/23 1222  Code Status: Full code Family Communication: None at bedside Level of care: Progressive Status is: Inpatient Remains inpatient appropriate because: Non-STEMI   Final disposition: Home Consultants:  Cardiology  55 minutes with  more than 50% spent in reviewing records, counseling patient/family and coordinating care.   Sch Meds:  Scheduled Meds:  aspirin  EC  81 mg Oral Daily   DULoxetine   30 mg Oral BID   feeding supplement  237 mL Oral BID BM   gabapentin   300 mg Oral 2 times per day   gabapentin   600 mg Oral QHS   metoprolol  succinate  12.5 mg Oral Daily   oseltamivir   30 mg Oral BID   pramipexole   0.5 mg Oral Once   Followed by   pramipexole   0.5 mg Oral QHS   Continuous Infusions:  heparin  1,200 Units/hr (12/05/23 0440)    nitroGLYCERIN  30 mcg/min (12/04/23 2121)   PRN Meds:.acetaminophen  **OR** acetaminophen , benzonatate , ondansetron  (ZOFRAN ) IV, zolpidem   Antimicrobials: Anti-infectives (From admission, onward)    Start     Dose/Rate Route Frequency Ordered Stop   12/04/23 2200  oseltamivir  (TAMIFLU ) capsule 30 mg  Status:  Discontinued       Placed in Followed by Linked Group   30 mg Oral 2 times daily 12/04/23 1142 12/04/23 1142   12/04/23 1145  oseltamivir  (TAMIFLU ) capsule 75 mg  Status:  Discontinued        75 mg Oral 2 times daily 12/04/23 1138 12/04/23 1142   12/04/23 1145  oseltamivir  (TAMIFLU ) capsule 75 mg  Status:  Discontinued       Placed in Followed by Linked Group   75 mg Oral Once 12/04/23 1142 12/04/23 1142   12/04/23 1145  oseltamivir  (TAMIFLU ) capsule 30 mg        30 mg Oral 2 times daily 12/04/23 1142 12/09/23 0959   12/04/23 0130  oseltamivir  (TAMIFLU ) capsule 75 mg        75 mg Oral  Once 12/04/23 0124 12/04/23 0211        I have personally reviewed the following labs and images: CBC: Recent Labs  Lab 12/03/23 1806 12/04/23 0449 12/05/23 0403  WBC 5.6 5.6 4.1  HGB 12.8* 12.5* 11.3*  HCT 37.9* 37.0* 33.5*  MCV 90.9 90.2 90.3  PLT 186 171 155   BMP &GFR Recent Labs  Lab 12/03/23 1806 12/04/23 0449 12/05/23 0753  NA 136 138 135  K 3.4* 3.8 3.9  CL 99 98 102  CO2 27 23 24   GLUCOSE 98 113* 100*  BUN 21 19 18   CREATININE 1.46* 1.47* 1.54*  CALCIUM  9.3 9.4 8.3*  MG  --  1.8  --   PHOS  --   --  2.6   Estimated Creatinine Clearance: 46.3 mL/min (A) (by C-G formula based on SCr of 1.54 mg/dL (H)). Liver & Pancreas: Recent Labs  Lab 12/04/23 0449 12/05/23 0403 12/05/23 0753  AST 161* 221*  --   ALT 34 43  --   ALKPHOS 52 41  --   BILITOT 0.6 0.6  --   PROT 6.3* 5.7*  --   ALBUMIN 3.7 3.3* 3.2*   No results for input(s): LIPASE, AMYLASE in the last 168 hours. No results for input(s): AMMONIA in the last 168 hours. Diabetic: No results for  input(s): HGBA1C in the last 72 hours. No results for input(s): GLUCAP in the last 168 hours. Cardiac Enzymes: Recent Labs  Lab 12/04/23 0449 12/05/23 0403  CKTOTAL 1,486* 1,063*   No results for input(s): PROBNP in the last 8760 hours. Coagulation Profile: No results for input(s): INR, PROTIME in the last 168 hours. Thyroid  Function Tests: No results for input(s): TSH, T4TOTAL, FREET4, T3FREE, THYROIDAB in the  last 72 hours. Lipid Profile: Recent Labs    12/05/23 0403  CHOL 158  HDL 38*  LDLCALC 108*  TRIG 58  CHOLHDL 4.2   Anemia Panel: Recent Labs    12/05/23 0403  FERRITIN 203   Urine analysis: No results found for: COLORURINE, APPEARANCEUR, LABSPEC, PHURINE, GLUCOSEU, HGBUR, BILIRUBINUR, KETONESUR, PROTEINUR, UROBILINOGEN, NITRITE, LEUKOCYTESUR Sepsis Labs: Invalid input(s): PROCALCITONIN, LACTICIDVEN  Microbiology: Recent Results (from the past 240 hours)  Resp panel by RT-PCR (RSV, Flu A&B, Covid) Anterior Nasal Swab     Status: Abnormal   Collection Time: 12/03/23  5:56 PM   Specimen: Anterior Nasal Swab  Result Value Ref Range Status   SARS Coronavirus 2 by RT PCR NEGATIVE NEGATIVE Final    Comment: (NOTE) SARS-CoV-2 target nucleic acids are NOT DETECTED.  The SARS-CoV-2 RNA is generally detectable in upper respiratory specimens during the acute phase of infection. The lowest concentration of SARS-CoV-2 viral copies this assay can detect is 138 copies/mL. A negative result does not preclude SARS-Cov-2 infection and should not be used as the sole basis for treatment or other patient management decisions. A negative result may occur with  improper specimen collection/handling, submission of specimen other than nasopharyngeal swab, presence of viral mutation(s) within the areas targeted by this assay, and inadequate number of viral copies(<138 copies/mL). A negative result must be combined with clinical  observations, patient history, and epidemiological information. The expected result is Negative.  Fact Sheet for Patients:  bloggercourse.com  Fact Sheet for Healthcare Providers:  seriousbroker.it  This test is no t yet approved or cleared by the [place]  FDA and  has been authorized for detection and/or diagnosis of SARS-CoV-2 by FDA under an Emergency Use Authorization (EUA). This EUA will remain  in effect (meaning this test can be used) for the duration of the COVID-19 declaration under Section 564(b)(1) of the Act, 21 U.S.C.section 360bbb-3(b)(1), unless the authorization is terminated  or revoked sooner.       Influenza A by PCR POSITIVE (A) NEGATIVE Final   Influenza B by PCR NEGATIVE NEGATIVE Final    Comment: (NOTE) The Xpert Xpress SARS-CoV-2/FLU/RSV plus assay is intended as an aid in the diagnosis of influenza from Nasopharyngeal swab specimens and should not be used as a sole basis for treatment. Nasal washings and aspirates are unacceptable for Xpert Xpress SARS-CoV-2/FLU/RSV testing.  Fact Sheet for Patients: bloggercourse.com  Fact Sheet for Healthcare Providers: seriousbroker.it  This test is not yet approved or cleared by the [place]  FDA and has been authorized for detection and/or diagnosis of SARS-CoV-2 by FDA under an Emergency Use Authorization (EUA). This EUA will remain in effect (meaning this test can be used) for the duration of the COVID-19 declaration under Section 564(b)(1) of the Act, 21 U.S.C. section 360bbb-3(b)(1), unless the authorization is terminated or revoked.     Resp Syncytial Virus by PCR NEGATIVE NEGATIVE Final    Comment: (NOTE) Fact Sheet for Patients: bloggercourse.com  Fact Sheet for Healthcare Providers: seriousbroker.it  This test is not yet approved or cleared  by the [place]  FDA and has been authorized for detection and/or diagnosis of SARS-CoV-2 by FDA under an Emergency Use Authorization (EUA). This EUA will remain in effect (meaning this test can be used) for the duration of the COVID-19 declaration under Section 564(b)(1) of the Act, 21 U.S.C. section 360bbb-3(b)(1), unless the authorization is terminated or revoked.  Performed at [facility], [facility], [address], [place], [place] [address]     Radiology  Studies: ECHOCARDIOGRAM COMPLETE Result Date: 12/04/2023    ECHOCARDIOGRAM REPORT   Patient Name:   MATTHIEU LOFTUS Date of Exam: 12/04/2023 Medical Rec #:  [id number]       Height:       68.0 in Accession #:    [id number]      Weight:       207.7 lb Date of Birth:  [birth year]        BSA:          2.077 m Patient Age:    72 years        BP:           137/78 mmHg Patient Gender: M               HR:           83 bpm. Exam Location:  Inpatient Procedure: 2D Echo, Cardiac Doppler, Color Doppler and Intracardiac            Opacification Agent Indications:    122-I22.9 Subsequent ST elevation (STEM) and non-ST elevation                 (NSTEMI) myocardial infarction  History:        Patient has prior history of Echocardiogram examinations, most                 recent 11/13/2019. Previous Myocardial Infarction and CAD,                 Signs/Symptoms:Shortness of Breath and Dyspnea; Risk                 Factors:Sleep Apnea and Hypertension.  Sonographer:    Ellouise Mose RDCS Referring Phys: [id number] EVA KATHEE PORE IMPRESSIONS  1. No evidence of LV thrombus. Definity  is administered. Left ventricular ejection fraction, by estimation, is 50 to 55%. The left ventricle has low normal function. The left ventricle demonstrates regional wall motion abnormalities (see scoring diagram/findings for description). Left ventricular diastolic parameters are consistent with Grade I diastolic dysfunction (impaired relaxation).  2. Right ventricular systolic function was not well  visualized. The right ventricular size is normal. There is normal pulmonary artery systolic pressure.  3. Right atrial size was moderately dilated.  4. The mitral valve is grossly normal. Mild mitral valve regurgitation. No evidence of mitral stenosis.  5. Tricuspid valve regurgitation is moderate.  6. The aortic valve is tricuspid. Aortic valve regurgitation is not visualized.  7. Aortic dilatation noted. There is mild dilatation of the aortic root, measuring 40 mm. There is borderline dilatation of the ascending aorta, measuring 39 mm.  8. The inferior vena cava is normal in size with greater than 50% respiratory variability, suggesting right atrial pressure of 3 mmHg. Conclusion(s)/Recommendation(s): No left ventricular mural or apical thrombus/thrombi. FINDINGS  Left Ventricle: No evidence of LV thrombus. Definity  is administered. Left ventricular ejection fraction, by estimation, is 50 to 55%. The left ventricle has low normal function. The left ventricle demonstrates regional wall motion abnormalities. The left ventricular internal cavity size was normal in size. There is no left ventricular hypertrophy. Left ventricular diastolic parameters are consistent with Grade I diastolic dysfunction (impaired relaxation). Normal left ventricular filling pressure.  LV Wall Scoring: The entire lateral wall is hypokinetic. The entire anterior wall, entire septum, entire inferior wall, and apex are normal. Right Ventricle: The right ventricular size is normal. No increase in right ventricular wall thickness. Right ventricular systolic function was not well visualized. There is normal pulmonary  artery systolic pressure. The tricuspid regurgitant velocity is  2.51 m/s, and with an assumed right atrial pressure of 3 mmHg, the estimated right ventricular systolic pressure is 28.2 mmHg. Left Atrium: Left atrial size was normal in size. Right Atrium: Right atrial size was moderately dilated. Pericardium: There is no evidence of  pericardial effusion. Mitral Valve: The mitral valve is grossly normal. Mild mitral valve regurgitation. No evidence of mitral valve stenosis. Tricuspid Valve: The tricuspid valve is grossly normal. Tricuspid valve regurgitation is moderate . No evidence of tricuspid stenosis. Aortic Valve: The aortic valve is tricuspid. Aortic valve regurgitation is not visualized. Pulmonic Valve: The pulmonic valve was normal in structure. Pulmonic valve regurgitation is trivial. No evidence of pulmonic stenosis. Aorta: Aortic dilatation noted. There is mild dilatation of the aortic root, measuring 40 mm. There is borderline dilatation of the ascending aorta, measuring 39 mm. Venous: The inferior vena cava is normal in size with greater than 50% respiratory variability, suggesting right atrial pressure of 3 mmHg. IAS/Shunts: No atrial level shunt detected by color flow Doppler.  LEFT VENTRICLE PLAX 2D LVIDd:         5.70 cm      Diastology LVIDs:         4.90 cm      LV e' medial:    8.49 cm/s LV PW:         0.90 cm      LV E/e' medial:  9.2 LV IVS:        0.90 cm      LV e' lateral:   11.10 cm/s LVOT diam:     2.50 cm      LV E/e' lateral: 7.0 LV SV:         89 LV SV Index:   43 LVOT Area:     4.91 cm  LV Volumes (MOD) LV vol d, MOD A2C: 91.4 ml LV vol d, MOD A4C: 132.9 ml LV vol s, MOD A2C: 49.8 ml LV vol s, MOD A4C: 72.6 ml LV SV MOD A2C:     41.6 ml LV SV MOD A4C:     132.9 ml LV SV MOD BP:      48.9 ml RIGHT VENTRICLE             IVC RV S prime:     14.90 cm/s  IVC diam: 1.30 cm TAPSE (M-mode): 2.6 cm LEFT ATRIUM             Index        RIGHT ATRIUM           Index LA diam:        2.70 cm 1.30 cm/m   RA Area:     17.20 cm LA Vol (A2C):   35.0 ml 16.85 ml/m  RA Volume:   46.40 ml  22.34 ml/m LA Vol (A4C):   21.3 ml 10.26 ml/m LA Biplane Vol: 27.2 ml 13.10 ml/m  AORTIC VALVE LVOT Vmax:   96.10 cm/s LVOT Vmean:  59.100 cm/s LVOT VTI:    0.182 m  AORTA Ao Root diam: 4.00 cm Ao Asc diam:  3.90 cm MITRAL VALVE                 TRICUSPID VALVE MV Area (PHT): 3.96 cm     TR Peak grad:   25.2 mmHg MV Decel Time: 191 msec     TR Vmax:        251.00 cm/s MR PISA:  0.57 cm MR PISA Radius: 0.30 cm     SHUNTS MV E velocity: 78.20 cm/s   Systemic VTI:  0.18 m MV A velocity: 100.73 cm/s  Systemic Diam: 2.50 cm MV E/A ratio:  0.78 Vishnu Priya Mallipeddi Electronically signed by Diannah Late Mallipeddi Signature Date/Time: 12/04/2023/2:38:51 PM    Final       Riley Hallum T. Tamar Lipscomb [facility]  If 7PM-7AM, please contact night-coverage [email or web address] 12/05/2023, 10:31 AM

## 2023-12-05 NOTE — Plan of Care (Signed)

## 2023-12-06 ENCOUNTER — Telehealth (HOSPITAL_COMMUNITY): Payer: Self-pay

## 2023-12-06 ENCOUNTER — Encounter (HOSPITAL_COMMUNITY)
Admission: EM | Disposition: A | Discharge status: Home / Self Care | Payer: Self-pay | Source: Home / Self Care | Attending: Internal Medicine

## 2023-12-06 ENCOUNTER — Other Ambulatory Visit (HOSPITAL_COMMUNITY): Payer: Self-pay

## 2023-12-06 ENCOUNTER — Encounter (HOSPITAL_COMMUNITY): Payer: Self-pay | Admitting: Cardiology

## 2023-12-06 DIAGNOSIS — I2510 Atherosclerotic heart disease of native coronary artery without angina pectoris: Secondary | ICD-10-CM

## 2023-12-06 DIAGNOSIS — R7989 Other specified abnormal findings of blood chemistry: Secondary | ICD-10-CM | POA: Diagnosis not present

## 2023-12-06 DIAGNOSIS — J101 Influenza due to other identified influenza virus with other respiratory manifestations: Secondary | ICD-10-CM | POA: Diagnosis not present

## 2023-12-06 DIAGNOSIS — G2581 Restless legs syndrome: Secondary | ICD-10-CM | POA: Diagnosis not present

## 2023-12-06 DIAGNOSIS — N183 Chronic kidney disease, stage 3 unspecified: Secondary | ICD-10-CM | POA: Insufficient documentation

## 2023-12-06 DIAGNOSIS — I214 Non-ST elevation (NSTEMI) myocardial infarction: Secondary | ICD-10-CM | POA: Diagnosis not present

## 2023-12-06 HISTORY — PX: CORONARY STENT INTERVENTION: CATH118234

## 2023-12-06 HISTORY — PX: CORONARY PRESSURE/FFR WITH 3D MAPPING: CATH118309

## 2023-12-06 HISTORY — PX: LEFT HEART CATH AND CORONARY ANGIOGRAPHY: CATH118249

## 2023-12-06 LAB — COMPREHENSIVE METABOLIC PANEL
ALT: 44 U/L (ref 0–44)
AST: 160 U/L — ABNORMAL HIGH (ref 15–41)
Albumin: 3.6 g/dL (ref 3.5–5.0)
Alkaline Phosphatase: 44 U/L (ref 38–126)
Anion gap: 11 (ref 5–15)
BUN: 19 mg/dL (ref 8–23)
CO2: 24 mmol/L (ref 22–32)
Calcium: 8.8 mg/dL — ABNORMAL LOW (ref 8.9–10.3)
Chloride: 102 mmol/L (ref 98–111)
Creatinine, Ser: 1.32 mg/dL — ABNORMAL HIGH (ref 0.61–1.24)
GFR, Estimated: 57 mL/min — ABNORMAL LOW (ref >60)
Glucose, Bld: 114 mg/dL — ABNORMAL HIGH (ref 70–99)
Potassium: 3.9 mmol/L (ref 3.5–5.1)
Sodium: 137 mmol/L (ref 135–145)
Total Bilirubin: 0.7 mg/dL (ref 0.0–1.2)
Total Protein: 6.2 g/dL — ABNORMAL LOW (ref 6.5–8.1)

## 2023-12-06 LAB — MAGNESIUM: Magnesium: 1.9 mg/dL (ref 1.7–2.4)

## 2023-12-06 LAB — CBC
HCT: 34.7 % — ABNORMAL LOW (ref 39.0–52.0)
Hemoglobin: 11.8 g/dL — ABNORMAL LOW (ref 13.0–17.0)
MCH: 30.3 pg (ref 26.0–34.0)
MCHC: 34 g/dL (ref 30.0–36.0)
MCV: 89 fL (ref 80.0–100.0)
Platelets: 174 10*3/uL (ref 150–400)
RBC: 3.9 MIL/uL — ABNORMAL LOW (ref 4.22–5.81)
RDW: 13 % (ref 11.5–15.5)
WBC: 5 10*3/uL (ref 4.0–10.5)
nRBC: 0 % (ref 0.0–0.2)

## 2023-12-06 LAB — POCT ACTIVATED CLOTTING TIME
Activated Clotting Time: 262 s
Activated Clotting Time: 279 s

## 2023-12-06 LAB — HEPARIN LEVEL (UNFRACTIONATED): Heparin Unfractionated: 0.76 [IU]/mL — ABNORMAL HIGH (ref 0.30–0.70)

## 2023-12-06 LAB — CK: Total CK: 635 U/L — ABNORMAL HIGH (ref 49–397)

## 2023-12-06 SURGERY — LEFT HEART CATH AND CORONARY ANGIOGRAPHY
Anesthesia: LOCAL

## 2023-12-06 MED ORDER — LIDOCAINE HCL (PF) 1 % IJ SOLN
INTRAMUSCULAR | Status: DC | PRN
  Administered 2023-12-06: 2 mL

## 2023-12-06 MED ORDER — PRASUGREL HCL 10 MG PO TABS
ORAL_TABLET | ORAL | Status: AC
Start: 2023-12-06 — End: ?
  Filled 2023-12-06: qty 6

## 2023-12-06 MED ORDER — VERAPAMIL HCL 2.5 MG/ML IV SOLN
INTRAVENOUS | Status: AC
  Filled 2023-12-06: qty 2

## 2023-12-06 MED ORDER — IOHEXOL 350 MG/ML SOLN
INTRAVENOUS | Status: DC | PRN
Start: 2023-12-06 — End: 2023-12-06
  Administered 2023-12-06: 120 mL via INTRA_ARTERIAL

## 2023-12-06 MED ORDER — SODIUM CHLORIDE 0.9% FLUSH: 3.0000 mL | INTRAVENOUS | Status: DC | PRN

## 2023-12-06 MED ORDER — SODIUM CHLORIDE 0.9 % IV SOLN: 250.0000 mL | INTRAVENOUS | Status: DC | PRN

## 2023-12-06 MED ORDER — HEPARIN (PORCINE) IN NACL 1000-0.9 UT/500ML-% IV SOLN
INTRAVENOUS | Status: DC | PRN
  Administered 2023-12-06 (×3): 500 mL via INTRA_ARTERIAL

## 2023-12-06 MED ORDER — PRASUGREL HCL 10 MG PO TABS
10.0000 mg | ORAL_TABLET | Freq: Every day | ORAL | Status: DC
  Administered 2023-12-07: 10 mg via ORAL
  Filled 2023-12-06: qty 1

## 2023-12-06 MED ORDER — MIDAZOLAM HCL 2 MG/2ML IJ SOLN
INTRAMUSCULAR | Status: DC | PRN
  Administered 2023-12-06 (×2): 1 mg via INTRAVENOUS

## 2023-12-06 MED ORDER — ROSUVASTATIN CALCIUM 20 MG PO TABS: 20.0000 mg | ORAL_TABLET | Freq: Every day | ORAL | Status: DC

## 2023-12-06 MED ORDER — SODIUM CHLORIDE 0.9 % IV SOLN: INTRAVENOUS | Status: AC

## 2023-12-06 MED ORDER — LORAZEPAM 0.5 MG PO TABS
0.5000 mg | ORAL_TABLET | Freq: Three times a day (TID) | ORAL | Status: DC | PRN
  Administered 2023-12-06 – 2023-12-07 (×2): 0.5 mg via ORAL
  Filled 2023-12-06 (×2): qty 1

## 2023-12-06 MED ORDER — HEPARIN SODIUM (PORCINE) 1000 UNIT/ML IJ SOLN
INTRAMUSCULAR | Status: DC | PRN
  Administered 2023-12-06: 2000 [IU] via INTRAVENOUS
  Administered 2023-12-06: 5000 [IU] via INTRAVENOUS

## 2023-12-06 MED ORDER — HEPARIN SODIUM (PORCINE) 1000 UNIT/ML IJ SOLN
INTRAMUSCULAR | Status: AC
  Filled 2023-12-06: qty 10

## 2023-12-06 MED ORDER — SODIUM CHLORIDE 0.9 % WEIGHT BASED INFUSION
1.0000 mL/kg/h | INTRAVENOUS | Status: DC
  Administered 2023-12-06: 1 mL/kg/h via INTRAVENOUS

## 2023-12-06 MED ORDER — POTASSIUM CHLORIDE 2 MEQ/ML IV SOLN
INTRAVENOUS | Status: AC
  Filled 2023-12-06: qty 1000

## 2023-12-06 MED ORDER — MIDAZOLAM HCL 2 MG/2ML IJ SOLN
INTRAMUSCULAR | Status: AC
  Filled 2023-12-06: qty 2

## 2023-12-06 MED ORDER — DICLOFENAC SODIUM 1 % EX GEL
2.0000 g | Freq: Four times a day (QID) | CUTANEOUS | Status: DC
  Administered 2023-12-06: 2 g via TOPICAL
  Filled 2023-12-06: qty 100

## 2023-12-06 MED ORDER — FENTANYL CITRATE (PF) 100 MCG/2ML IJ SOLN
INTRAMUSCULAR | Status: DC | PRN
  Administered 2023-12-06 (×2): 25 ug via INTRAVENOUS

## 2023-12-06 MED ORDER — HEPARIN SODIUM (PORCINE) 1000 UNIT/ML IJ SOLN
INTRAMUSCULAR | Status: DC | PRN
Start: 2023-12-06 — End: 2023-12-06
  Administered 2023-12-06: 4500 [IU] via INTRAVENOUS

## 2023-12-06 MED ORDER — PRASUGREL HCL 10 MG PO TABS
ORAL_TABLET | ORAL | Status: DC | PRN
  Administered 2023-12-06: 60 mg via ORAL

## 2023-12-06 MED ORDER — FAMOTIDINE IN NACL 20-0.9 MG/50ML-% IV SOLN
INTRAVENOUS | Status: AC
  Filled 2023-12-06: qty 50

## 2023-12-06 MED ORDER — SODIUM CHLORIDE 0.9% FLUSH
3.0000 mL | Freq: Two times a day (BID) | INTRAVENOUS | Status: DC
  Administered 2023-12-06: 3 mL via INTRAVENOUS

## 2023-12-06 MED ORDER — SODIUM CHLORIDE 0.9 % WEIGHT BASED INFUSION: 3.0000 mL/kg/h | INTRAVENOUS | Status: DC

## 2023-12-06 MED ORDER — FENTANYL CITRATE (PF) 100 MCG/2ML IJ SOLN
INTRAMUSCULAR | Status: AC
  Filled 2023-12-06: qty 2

## 2023-12-06 MED ORDER — LIDOCAINE HCL (PF) 1 % IJ SOLN
INTRAMUSCULAR | Status: AC
  Filled 2023-12-06: qty 30

## 2023-12-06 MED ORDER — NITROGLYCERIN 1 MG/10 ML FOR IR/CATH LAB
INTRA_ARTERIAL | Status: AC
  Filled 2023-12-06: qty 10

## 2023-12-06 MED ORDER — HYDRALAZINE HCL 20 MG/ML IJ SOLN: 10.0000 mg | INTRAMUSCULAR | Status: AC | PRN

## 2023-12-06 MED ORDER — KCL IN DEXTROSE-NACL 20-5-0.9 MEQ/L-%-% IV SOLN
INTRAVENOUS | Status: DC
  Filled 2023-12-06 (×2): qty 1000

## 2023-12-06 MED ORDER — LABETALOL HCL 5 MG/ML IV SOLN: 10.0000 mg | INTRAVENOUS | Status: AC | PRN

## 2023-12-06 MED ORDER — MORPHINE SULFATE (PF) 2 MG/ML IV SOLN: 2.0000 mg | INTRAVENOUS | Status: DC | PRN

## 2023-12-06 SURGICAL SUPPLY — 22 items
BALLN EMERGE MR 2.25X12 (BALLOONS) ×1
BALLN EMERGE MR 3.0X20 (BALLOONS) ×1
BALLOON EMERGE MR 2.25X12 (BALLOONS) IMPLANT
BALLOON EMERGE MR 3.0X20 (BALLOONS) IMPLANT
CARD KEY FFR CATHWORX (MISCELLANEOUS) IMPLANT
CATH INFINITI AMBI 5FR TG (CATHETERS) IMPLANT
CATH VISTA GUIDE 6FR XBLD 3.5 (CATHETERS) IMPLANT
COVER SWIFTLINK CONNECTOR (BAG) IMPLANT
DEVICE RAD COMP TR BAND LRG (VASCULAR PRODUCTS) IMPLANT
FFR CATHWORX KEY CARD (MISCELLANEOUS) ×1
GLIDESHEATH SLEND SS 6F .021 (SHEATH) IMPLANT
GUIDEWIRE INQWIRE 1.5J.035X260 (WIRE) IMPLANT
INQWIRE 1.5J .035X260CM (WIRE) ×1
KIT ENCORE 26 ADVANTAGE (KITS) IMPLANT
PACK CARDIAC CATHETERIZATION (CUSTOM PROCEDURE TRAY) ×2 IMPLANT
STENT SYNERGY XD 2.50X12 (Permanent Stent) IMPLANT
STENT SYNERGY XD 3.0X28 (Permanent Stent) IMPLANT
SYNERGY XD 2.50X12 (Permanent Stent) ×1 IMPLANT
SYNERGY XD 3.0X28 (Permanent Stent) ×1 IMPLANT
TUBING CIL FLEX 10 FLL-RA (TUBING) IMPLANT
WIRE ASAHI PROWATER 180CM (WIRE) IMPLANT
WIRE HI TORQ BMW 190CM (WIRE) IMPLANT

## 2023-12-06 NOTE — Telephone Encounter (Signed)
 Pharmacy Patient Advocate Encounter  Insurance verification completed.    The patient is insured through [employer]. Patient has Medicare and is not eligible for a copay card, but may be able to apply for patient assistance or Medicare RX Payment Plan (Patient Must reach out to their plan, if eligible for payment plan), if available.    Ran test claim for Brilinta and the current 30 day co-pay is $437.16 .   This test claim was processed through [facility]- copay amounts may vary at other pharmacies due to pharmacy/plan contracts, or as the patient moves through the different stages of their insurance plan.

## 2023-12-06 NOTE — Progress Notes (Signed)
 PHARMACY - ANTICOAGULATION CONSULT NOTE  Pharmacy Consult for Heparin   Indication: chest pain/ACS  Allergies  Allergen Reactions   Nsaids Other (See Comments)    "Cannot take because of my kidney disease."    Patient Measurements: Height: 5\' 8"  (172.7 cm) Weight: 91.6 kg (201 lb 14.4 oz) IBW/kg (Calculated) : 68.4  Vital Signs: Temp: 98.4 F (36.9 C) (02/10 0510) Temp Source: Oral (02/10 0510) BP: 154/99 (02/10 0510) Pulse Rate: 83 (02/10 0510)  Labs: Recent Labs    12/03/23 1806 12/03/23 2049 12/04/23 0449 12/04/23 0743 12/04/23 1553 12/05/23 0403 12/05/23 0753 12/06/23 0407  HGB 12.8*  --  12.5*  --   --  11.3*  --  11.8*  HCT 37.9*  --  37.0*  --   --  33.5*  --  34.7*  PLT 186  --  171  --   --  155  --  174  HEPARINUNFRC  --   --   --    < > 0.42 0.50  --  0.76*  CREATININE 1.46*  --  1.47*  --   --   --  1.54* 1.32*  CKTOTAL  --   --  1,486*  --   --  1,063*  --  635*  TROPONINIHS 309* 2,633*  --   --   --   --  >24,000*  --    < > = values in this interval not displayed.    Estimated Creatinine Clearance: 54 mL/min (A) (by C-G formula based on SCr of 1.32 mg/dL (H)).   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   Sleep apnea    uses Cpap nightly     Assessment: 73 y/o M with chest pain and rising troponin. Pharmacy dosing heparin  (no anticoagulants noted PTA)  -Heparin  level 0.76, supra-therapeutic on heparin  1200 units/hr. CBC stable -plans noted for cardiac cath today   Goal of Therapy:  Heparin  level 0.3-0.7 units/ml Monitor platelets by anticoagulation protocol: Yes   Plan:  -Decrease heparin  to 1100 units/hr -Will follow plans post cath  Baxter Limber, PharmD Clinical  Pharmacist **Pharmacist phone directory can now be found on [email or web address] ([email or web address]).  Listed under [facility].

## 2023-12-06 NOTE — H&P (View-Only) (Signed)
 Patient Name: [name] Date of Encounter: 12/06/2023 [facility] Cardiologist: Randene Bustard, MD   Interval Summary  .    No chest pain since receiving SL NTG in the ED. Family at bedside, remains fatigued.   Vital Signs .    Vitals:   12/05/23 1612 12/05/23 2038 12/06/23 0510 12/06/23 0816  BP: (!) 147/88 139/71 (!) 154/99 122/75  Pulse: 86 93 83 73  Resp: 18 18 18 18   Temp: 98.7 F (37.1 C) 99.5 F (37.5 C) 98.4 F (36.9 C) 98.8 F (37.1 C)  TempSrc: Oral Oral Oral Oral  SpO2:  96% 96% 97%  Weight:   91.6 kg   Height:        Intake/Output Summary (Last 24 hours) at 12/06/2023 0952 Last data filed at 12/06/2023 0705 Gross per 24 hour  Intake 729.48 ml  Output --  Net 729.48 ml      12/06/2023    5:10 AM 12/05/2023    4:09 AM 12/04/2023    2:23 PM  Last 3 Weights  Weight (lbs) 201 lb 14.4 oz 202 lb 202 lb 14.4 oz  Weight (kg) 91.581 kg 91.627 kg 92.035 kg      Telemetry/ECG    Sinus Rhythm- bradycardia - Personally Reviewed  Physical Exam .   GEN: No acute distress.   Neck: No JVD Cardiac: RRR, no murmurs, rubs, or gallops.  Respiratory: Clear to auscultation bilaterally. GI: Soft, nontender, non-distended  MS: No edema  Assessment & Plan .     73 y.o. male with a hx of HTN, HLD, chronic kidney disease stage 3a who is being seen 12/04/2023 for the evaluation of NSTEMI at the request of the emergency department.   NSTEMI -- presented with chest pain on 2/7. Initial hsTn 309>>2633>>24000 over the weekend -- EKG 2/7 without evidence of ischemia -- echo showed LVEF of 50-55%, g1DD, normal RV, mild MR, moderate TR -- on IV heparin , NTG, ASA, metoprolol  succ 12.5mg  daily -- planned for cardiac cath today, will plan for next case  Informed Consent   Shared Decision Making/Informed Consent The risks [stroke (1 in 1000), death (1 in 1000), kidney failure [usually temporary] (1 in 500), bleeding (1 in 200), allergic reaction [possibly serious] (1  in 200)], benefits (diagnostic support and management of coronary artery disease) and alternatives of a cardiac catheterization were discussed in detail with [name] and he is willing to proceed.     Flu A -- symptomatic for 5 days -- on Tamiflu   HTN -- controlled -- on IV NTG, Toprol  XL 12.5mg  daily (hydrochlorothiazide PTA)  HLD -- LDL 108, HDL 38 -- initially statin was held in the setting of elevated CK, he reports a hx of statin intolerance (has tried 4 different ones in the past) will plan for lipid clinic referral  Mild aortic root dilatation -- 40mm on echo   Elevated CK Elevated AST -- likely 2/2 to flu  For questions or updates, please contact  [facility] Please consult [email or web address] for contact info under        Signed, Johnie Nailer, NP   I have personally seen and examined this patient. I agree with the assessment and plan as outlined above.  73 yo male with history of HTN, HLD, CKD admitted with chest pain. Troponin up to 24,000 over the weekend but he has been pain free. Influenza A positive.  Plans for cardiac cath today Labs reviewed by me. Renal function is stable.  EKG from  12/03/23 reviewed by me and shows sinus with no ischemic changes Echo images reviewed by me. Lateral wall with severe hypokinesis. Low normal LV systolic function overall.     Exam: NAD  CV:RRR Lungs: clear bilaterally  Ext: No LE edema.   Plan: NSTEMI: Cardiac cath today with probable PCI. We will move him up on the schedule. Continue IV heparin . Continue ASA and beta blocker.  He is NPO. All questions answered.   Antoinette Batman, MD, [facility] 12/06/2023 10:31 AM

## 2023-12-06 NOTE — Progress Notes (Addendum)
 Patient Name: [name] Date of Encounter: 12/06/2023 [facility] Cardiologist: Randene Bustard, MD   Interval Summary  .    No chest pain since receiving SL NTG in the ED. Family at bedside, remains fatigued.   Vital Signs .    Vitals:   12/05/23 1612 12/05/23 2038 12/06/23 0510 12/06/23 0816  BP: (!) 147/88 139/71 (!) 154/99 122/75  Pulse: 86 93 83 73  Resp: 18 18 18 18   Temp: 98.7 F (37.1 C) 99.5 F (37.5 C) 98.4 F (36.9 C) 98.8 F (37.1 C)  TempSrc: Oral Oral Oral Oral  SpO2:  96% 96% 97%  Weight:   91.6 kg   Height:        Intake/Output Summary (Last 24 hours) at 12/06/2023 0952 Last data filed at 12/06/2023 0705 Gross per 24 hour  Intake 729.48 ml  Output --  Net 729.48 ml      12/06/2023    5:10 AM 12/05/2023    4:09 AM 12/04/2023    2:23 PM  Last 3 Weights  Weight (lbs) 201 lb 14.4 oz 202 lb 202 lb 14.4 oz  Weight (kg) 91.581 kg 91.627 kg 92.035 kg      Telemetry/ECG    Sinus Rhythm- bradycardia - Personally Reviewed  Physical Exam .   GEN: No acute distress.   Neck: No JVD Cardiac: RRR, no murmurs, rubs, or gallops.  Respiratory: Clear to auscultation bilaterally. GI: Soft, nontender, non-distended  MS: No edema  Assessment & Plan .     73 y.o. male with a hx of HTN, HLD, chronic kidney disease stage 3a who is being seen 12/04/2023 for the evaluation of NSTEMI at the request of the emergency department.   NSTEMI -- presented with chest pain on 2/7. Initial hsTn 309>>2633>>24000 over the weekend -- EKG 2/7 without evidence of ischemia -- echo showed LVEF of 50-55%, g1DD, normal RV, mild MR, moderate TR -- on IV heparin , NTG, ASA, metoprolol  succ 12.5mg  daily -- planned for cardiac cath today, will plan for next case  Informed Consent   Shared Decision Making/Informed Consent The risks [stroke (1 in 1000), death (1 in 1000), kidney failure [usually temporary] (1 in 500), bleeding (1 in 200), allergic reaction [possibly serious] (1  in 200)], benefits (diagnostic support and management of coronary artery disease) and alternatives of a cardiac catheterization were discussed in detail with [name] and he is willing to proceed.     Flu A -- symptomatic for 5 days -- on Tamiflu   HTN -- controlled -- on IV NTG, Toprol  XL 12.5mg  daily (hydrochlorothiazide PTA)  HLD -- LDL 108, HDL 38 -- initially statin was held in the setting of elevated CK, he reports a hx of statin intolerance (has tried 4 different ones in the past) will plan for lipid clinic referral  Mild aortic root dilatation -- 40mm on echo   Elevated CK Elevated AST -- likely 2/2 to flu  For questions or updates, please contact  [facility] Please consult [email or web address] for contact info under        Signed, Johnny Nailer, NP   I have personally seen and examined this patient. I agree with the assessment and plan as outlined above.  73 yo male with history of HTN, HLD, CKD admitted with chest pain. Troponin up to 24,000 over the weekend but he has been pain free. Influenza A positive.  Plans for cardiac cath today Labs reviewed by me. Renal function is stable.  EKG from  12/03/23 reviewed by me and shows sinus with no ischemic changes Echo images reviewed by me. Lateral wall with severe hypokinesis. Low normal LV systolic function overall.     Exam: NAD  CV:RRR Lungs: clear bilaterally  Ext: No LE edema.   Plan: NSTEMI: Cardiac cath today with probable PCI. We will move him up on the schedule. Continue IV heparin . Continue ASA and beta blocker.  He is NPO. All questions answered.   Antoinette Batman, MD, [facility] 12/06/2023 10:31 AM

## 2023-12-06 NOTE — Interval H&P Note (Signed)
 History and Physical Interval Note:  12/06/2023 10:46 AM  [name]  has presented today for surgery, with the diagnosis of nstemi.  The various methods of treatment have been discussed with the patient and family. After consideration of risks, benefits and other options for treatment, the patient has consented to  Procedure(s): LEFT HEART CATH AND CORONARY ANGIOGRAPHY (N/A)  PERCUTANEOUS CORONARY INTERVENTION  as a surgical intervention.  The patient's history has been reviewed, patient examined, no change in status, stable for surgery.  I have reviewed the patient's chart and labs.  Questions were answered to the patient's satisfaction.    Cath Lab Visit (complete for each Cath Lab visit)  Clinical Evaluation Leading to the Procedure:   ACS: Yes.    Non-ACS:    Anginal Classification: CCS IV  Anti-ischemic medical therapy: Minimal Therapy (1 class of medications)  Non-Invasive Test Results: No non-invasive testing performed  Prior CABG: No previous CABG     Randene Bustard

## 2023-12-06 NOTE — Brief Op Note (Signed)
 BRIEF CARDIAC CATH /PCI NOTE  12/06/2023 12:17 PM  Patient: [name]  MRN: [id number]  Primary Care Provider: Omie Bickers, MD [facility] Cardiologist: Randene Bustard, MD  Referring Cardiologist: Antoinette Batman, MD; Johnie Nailer, NP  SURGEON:  Surgeons and Role:   * Arleen Lacer, MD - Primary  PROCEDURE:  Procedure(s): LEFT HEART CATH AND CORONARY ANGIOGRAPHY (N/A) CORONARY STENT INTERVENTION (N/A) Coronary Pressure/FFR w/3D Mapping (N/A)  PATIENT:  [name]  73 y.o. male with PMH notable for HTN, HLD and CKD-3A (solitary kidney) who presented on 12/04/2023 with a non-STEMI, also noted to be positive for influenza.  Was managed over the weekend with IV heparin  and nitrates and now presents for invasive valuation.  Catheterization on 12/06/2023.  PRE-OPERATIVE DIAGNOSIS:  nstemi  POST-OPERATIVE DIAGNOSIS: Left Dominant System with Two-Vessel CAD: CULPRIT LESION: Major D1 with proximal 90% stenosis (Cath Works FFR 0.56); TIMI-3 flow Successful DES PCI with Synergy XD 2.5 mm x 12 mm stent deployed to 2.6 mm-reduced to 0%, TIMI-3 flow maintained Proximal-Mid LAD segmental (from D1-SP1) 70% stenosis (Cath Works FFR 0.79), TIMI-3 flow  Successful DES PCI with Synergy XD 3.0 mm 28 mm stent deployed to 3.2 mm; reduced to 0% (post PCI cath Works FFR 0.94) TIMI-3 flow maintained Remainder the LAD has a very small caliber D2 with sequential 20 to 30% stenoses followed by an apical 60% stenosis just before distal D3. Large dominant LCx with the major OM1, small LPL 1 and LPL 3 with moderate caliber major LPL 2 and LPDA-free of disease. Small nondominant RCA Preserved LVEF by echocardiogram-no LV gram performed to conserve contrast.  LVEDP estimated 20 mmHg  PROCEDURE PERFORMED Time Out: Verified patient identification, verified procedure, site/side was marked, verified correct patient position, special equipment/implants available,  medications/allergies/relevent history reviewed, required imaging and test results available. Performed.  Access:  RIGHT Radial Artery: 6 Fr sheath -- Seldinger technique using Micropuncture Kit -- Direct ultrasound guidance used.  Permanent image obtained and placed on chart. -- 10 mL radial cocktail IA;  okay 4500 Units IV Heparin   Left Heart Catheterization: 5 & 5Fr Catheters advanced or exchanged over a J-wire under direct fluoroscopic guidance into the ascending aorta; TIG 4.0 catheter advanced first.  * LV Hemodynamics (NO LV Gram): TIG 4.0 catheter  * Left & Right Coronary Artery Cineangiography: TIG 4.0 catheter    Review of initial angiography revealed: LAD and diagonal lesions as noted above  Preparations are made for PCI of the diagonal with Cath Works FFR measurement of LAD and PCI of indicated PCI of the 1st Diag performed: See FINDINGS XB LAD 3.5 guide catheter, Prowater wire -> 2.25 mm x 12 mm balloon (8 ATM x 20 seconds) => Synergy XD 2.5 mm x 12 mm deployed with stent balloon to 2.6 mm PCI of proximal to mid LAD performed: See FINDINGS XB LAD 3.5 guide catheter, BMW wire -> 3.0 mm x 20 mm balloon (10 ATM x 20 seconds) => Synergy XD 3.0 mm x 28 mm deployed with stent balloon to 3.2 mm  Upon completion of Angiogaphy, the catheter was removed completely out of the body over a wire, without complication.  Radial sheath removed in the Cardiac Catheterization lab with TR Band placed for hemostasis.  TR Band: 1210  Hours; 12 mL air; reverse Barbeau C  MEDICATIONS Radial Cocktail: 3 mg Verapmil in 10 mL NS Heparin : Total of 9000 units P.o. Effient  60 mg x 1 (chosen to avoid confusion of  possible respiratory symptoms with his ongoing flu symptoms   ANESTHESIA:   local and IV sedation; 3 mL SQ lidocaine ; 2 mg Versed , 50 mcg fentanyl   EBL:  <50 mL  PATIENT DISPOSITION:  PACU - hemodynamically stable.  From the PACU he will go to the nursing floor for ongoing  care  DICTATION: .Note written in EPIC  PLAN OF CARE:  Post cath hydration for 6 hours due to solitary kidney; potential discharge later this evening versus tomorrow morning after renal function assessment.  DAPT x 1 year with ASA/Effient , after 1 year will convert to Plavix monotherapy to complete a second year.  Continue GDMT titration for CAD    Delay start of Pharmacological VTE agent (>24hrs) due to surgical blood loss or risk of bleeding: not applicable   Randene Bustard, MD

## 2023-12-06 NOTE — Plan of Care (Signed)
  Problem: Education: Goal: Knowledge of General Education information will improve Description: Including pain rating scale, medication(s)/side effects and non-pharmacologic comfort measures Outcome: Progressing   Problem: Health Behavior/Discharge Planning: Goal: Ability to manage health-related needs will improve Outcome: Progressing   Problem: Clinical Measurements: Goal: Ability to maintain clinical measurements within normal limits will improve Outcome: Progressing Goal: Will remain free from infection Outcome: Progressing Goal: Diagnostic test results will improve Outcome: Progressing Goal: Respiratory complications will improve Outcome: Progressing Goal: Cardiovascular complication will be avoided Outcome: Progressing   Problem: Activity: Goal: Risk for activity intolerance will decrease Outcome: Progressing   Problem: Nutrition: Goal: Adequate nutrition will be maintained Outcome: Progressing   Problem: Coping: Goal: Level of anxiety will decrease Outcome: Progressing   Problem: Elimination: Goal: Will not experience complications related to bowel motility Outcome: Progressing Goal: Will not experience complications related to urinary retention Outcome: Progressing   Problem: Pain Managment: Goal: General experience of comfort will improve and/or be controlled Outcome: Progressing   Problem: Safety: Goal: Ability to remain free from injury will improve Outcome: Progressing   Problem: Skin Integrity: Goal: Risk for impaired skin integrity will decrease Outcome: Progressing   Problem: Education: Goal: Understanding of CV disease, CV risk reduction, and recovery process will improve Outcome: Progressing Goal: Individualized Educational Video(s) Outcome: Progressing   Problem: Activity: Goal: Ability to return to baseline activity level will improve Outcome: Progressing   Problem: Cardiovascular: Goal: Ability to achieve and maintain adequate  cardiovascular perfusion will improve Outcome: Progressing Goal: Vascular access site(s) Level 0-1 will be maintained Outcome: Progressing   Problem: Health Behavior/Discharge Planning: Goal: Ability to safely manage health-related needs after discharge will improve Outcome: Progressing   Problem: Education: Goal: Understanding of CV disease, CV risk reduction, and recovery process will improve Outcome: Progressing Goal: Individualized Educational Video(s) Outcome: Progressing   Problem: Activity: Goal: Ability to return to baseline activity level will improve Outcome: Progressing   Problem: Cardiovascular: Goal: Ability to achieve and maintain adequate cardiovascular perfusion will improve Outcome: Progressing Goal: Vascular access site(s) Level 0-1 will be maintained Outcome: Progressing   Problem: Health Behavior/Discharge Planning: Goal: Ability to safely manage health-related needs after discharge will improve Outcome: Progressing

## 2023-12-06 NOTE — Progress Notes (Signed)
 PROGRESS NOTE  [name] OAC:[id number] DOB: 01/30/1951   PCP: Omie Bickers, MD  Patient is from: Home.  Very independent and runs wood workshop.  DOA: 12/03/2023 LOS: 2  Chief complaints Chief Complaint  Patient presents with   Chest Pain     Brief Narrative / Interim history: 73 year old M with PMH of HTN, HLD, OSA with inspire implant, anxiety, depression and RLS presented to [facility], ED with acute onsets chest pain/pressure for 1 day, and admitted for non-STEMI.  He also had myalgia for 3 days prior.  Influenza A PCR was positive.  Troponin elevated to 309>> 2600.  Cardiology consulted.  Patient was started on IV heparin , nitro drip and Tamiflu  and transferred to [facility] for further cardiac evaluation.  Troponin trended up to > 24K this morning.  Patient has not had further chest pain.  TTE with LVEF of 50 to 55%, RWMA, G1 DD and moderate TR.  Remains on IV heparin .  Plan for LHC on 2/10.  Cardiology following.  Subjective: Seen and examined earlier this morning.  No major events overnight of this morning.  Reports some nausea, anxiety and feeling hungry.  Denies chest pain or shortness of breath.  Patient's wife at bedside.  Objective: Vitals:   12/05/23 2038 12/06/23 0510 12/06/23 0816 12/06/23 1100  BP: 139/71 (!) 154/99 122/75   Pulse: 93 83 73   Resp: 18 18 18    Temp: 99.5 F (37.5 C) 98.4 F (36.9 C) 98.8 F (37.1 C)   TempSrc: Oral Oral Oral   SpO2: 96% 96% 97% 100%  Weight:  91.6 kg    Height:        Examination:  GENERAL: No apparent distress.  Nontoxic.  Sitting on bedside chair.  Appears anxious. HEENT: MMM.  Vision and hearing grossly intact.  NECK: Supple.  No apparent JVD.  RESP:  No IWOB.  Fair aeration bilaterally. CVS:  RRR. Heart sounds normal.  ABD/GI/GU: BS+. Abd soft, NTND.  MSK/EXT:  Moves extremities. No apparent deformity. No edema.  SKIN: no apparent skin lesion or wound NEURO: Awake, alert and oriented appropriately.  No apparent focal  neuro deficit. PSYCH: Appears anxious.  Procedures:  None  Microbiology summarized: Influenza A PCR positive COVID-19, influenza B and RSV PCR nonreactive  Assessment and plan: Non-STEMI/chest pain: Denies prior history of chest pain at rest or exertion.  However, noted increased fatigue over the last 6 months.  He runs Clinical cytogeneticist.  Troponin 309>> 2600>> greater than 24K this morning.  Has been chest pain-free since admission.  TTE with RWMA.  LDL 108.  Never smoker.  No prior history of CAD.  He reports clean cath about 15 years ago. -S/p full dose aspirin  -Continue IV heparin , nitro drip, low-dose aspirin  and metoprolol  -Plan for LHC today  Moderate TR -Per cardiology.  Elevated AST: Likely due to CK and influenza.  Improving. -Continue monitoring  CKD-3A: Stable Recent Labs    12/03/23 1806 12/04/23 0449 12/05/23 0753 12/06/23 0407  BUN 21 19 18 19   CREATININE 1.46* 1.47* 1.54* 1.32*  -Continue monitoring    Influenza A infection: Symptomatic for 5 days -Reasonable to continue Tamiflu    OSA s/p Inspire implantation  Anxiety/depression/RLS: Appears anxious. -Continue home Cymbalta , gabapentin  and Mirapex   Obesity Body mass index is 30.7 kg/m. -Encourage lifestyle change to lose weight          DVT prophylaxis:  Place and maintain sequential compression device Start: 12/04/23 1222  Code Status: Full code Family  Communication: None at bedside Level of care: Progressive Status is: Inpatient Remains inpatient appropriate because: Non-STEMI   Final disposition: Home Consultants:  Cardiology  35 minutes with more than 50% spent in reviewing records, counseling patient/family and coordinating care.   Sch Meds:  Scheduled Meds:  [MAR Hold] aspirin  EC  81 mg Oral Daily   [MAR Hold] diclofenac  Sodium  2 g Topical QID   [MAR Hold] DULoxetine   60 mg Oral Daily   And   [MAR Hold] DULoxetine   30 mg Oral QHS   [MAR Hold] feeding supplement  237 mL Oral  BID BM   [MAR Hold] gabapentin   300 mg Oral 2 times per day   [MAR Hold] gabapentin   600 mg Oral QHS   [MAR Hold] metoprolol  succinate  12.5 mg Oral Daily   nitroGLYCERIN        [MAR Hold] oseltamivir   30 mg Oral BID   [MAR Hold] pramipexole   0.5 mg Oral QHS   Continuous Infusions:  sodium chloride  1 mL/kg/hr (12/06/23 1044)   dextrose  5 % and 0.9 % NaCl 1,000 mL with potassium chloride  20 mEq infusion     famotidine      heparin  Stopped (12/06/23 1042)   [MAR Hold] nitroGLYCERIN  16.667 mcg/min (12/06/23 1025)   PRN Meds:.[MAR Hold] acetaminophen  **OR** [MAR Hold] acetaminophen , [MAR Hold] benzonatate , famotidine , fentaNYL , Heparin  (Porcine) in NaCl, heparin  sodium (porcine), lidocaine  (PF), midazolam , nitroGLYCERIN , [MAR Hold] ondansetron  (ZOFRAN ) IV, prasugrel , [MAR Hold] zolpidem   Antimicrobials: Anti-infectives (From admission, onward)    Start     Dose/Rate Route Frequency Ordered Stop   12/04/23 2200  oseltamivir  (TAMIFLU ) capsule 30 mg  Status:  Discontinued       Placed in "Followed by" Linked Group   30 mg Oral 2 times daily 12/04/23 1142 12/04/23 1142   12/04/23 1145  oseltamivir  (TAMIFLU ) capsule 75 mg  Status:  Discontinued        75 mg Oral 2 times daily 12/04/23 1138 12/04/23 1142   12/04/23 1145  oseltamivir  (TAMIFLU ) capsule 75 mg  Status:  Discontinued       Placed in "Followed by" Linked Group   75 mg Oral Once 12/04/23 1142 12/04/23 1142   12/04/23 1145  [MAR Hold]  oseltamivir  (TAMIFLU ) capsule 30 mg        (MAR Hold since Mon 12/06/2023 at 1056.Hold Reason: Transfer to a Procedural area)   30 mg Oral 2 times daily 12/04/23 1142 12/09/23 0959   12/04/23 0130  oseltamivir  (TAMIFLU ) capsule 75 mg        75 mg Oral  Once 12/04/23 0124 12/04/23 0211        I have personally reviewed the following labs and images: CBC: Recent Labs  Lab 12/03/23 1806 12/04/23 0449 12/05/23 0403 12/06/23 0407  WBC 5.6 5.6 4.1 5.0  HGB 12.8* 12.5* 11.3* 11.8*  HCT 37.9* 37.0*  33.5* 34.7*  MCV 90.9 90.2 90.3 89.0  PLT 186 171 155 174   BMP &GFR Recent Labs  Lab 12/03/23 1806 12/04/23 0449 12/05/23 0753 12/06/23 0407  NA 136 138 135 137  K 3.4* 3.8 3.9 3.9  CL 99 98 102 102  CO2 27 23 24 24   GLUCOSE 98 113* 100* 114*  BUN 21 19 18 19   CREATININE 1.46* 1.47* 1.54* 1.32*  CALCIUM  9.3 9.4 8.3* 8.8*  MG  --  1.8  --  1.9  PHOS  --   --  2.6  --    Estimated Creatinine Clearance: 54 mL/min (A) (by  C-G formula based on SCr of 1.32 mg/dL (H)). Liver & Pancreas: Recent Labs  Lab 12/04/23 0449 12/05/23 0403 12/05/23 0753 12/06/23 0407  AST 161* 221*  --  160*  ALT 34 43  --  44  ALKPHOS 52 41  --  44  BILITOT 0.6 0.6  --  0.7  PROT 6.3* 5.7*  --  6.2*  ALBUMIN 3.7 3.3* 3.2* 3.6   No results for input(s): "LIPASE", "AMYLASE" in the last 168 hours. No results for input(s): "AMMONIA" in the last 168 hours. Diabetic: Recent Labs    12/05/23 0403  HGBA1C 5.4   No results for input(s): "GLUCAP" in the last 168 hours. Cardiac Enzymes: Recent Labs  Lab 12/04/23 0449 12/05/23 0403 12/06/23 0407  CKTOTAL 1,486* 1,063* 635*   No results for input(s): "PROBNP" in the last 8760 hours. Coagulation Profile: No results for input(s): "INR", "PROTIME" in the last 168 hours. Thyroid  Function Tests: No results for input(s): "TSH", "T4TOTAL", "FREET4", "T3FREE", "THYROIDAB" in the last 72 hours. Lipid Profile: Recent Labs    12/05/23 0403  CHOL 158  HDL 38*  LDLCALC 108*  TRIG 58  CHOLHDL 4.2   Anemia Panel: Recent Labs    12/05/23 0403  FERRITIN 203   Urine analysis: No results found for: "COLORURINE", "APPEARANCEUR", "LABSPEC", "PHURINE", "GLUCOSEU", "HGBUR", "BILIRUBINUR", "KETONESUR", "PROTEINUR", "UROBILINOGEN", "NITRITE", "LEUKOCYTESUR" Sepsis Labs: Invalid input(s): "PROCALCITONIN", "LACTICIDVEN"  Microbiology: Recent Results (from the past 240 hours)  Resp panel by RT-PCR (RSV, Flu A&B, Covid) Anterior Nasal Swab     Status:  Abnormal   Collection Time: 12/03/23  5:56 PM   Specimen: Anterior Nasal Swab  Result Value Ref Range Status   SARS Coronavirus 2 by RT PCR NEGATIVE NEGATIVE Final    Comment: (NOTE) SARS-CoV-2 target nucleic acids are NOT DETECTED.  The SARS-CoV-2 RNA is generally detectable in upper respiratory specimens during the acute phase of infection. The lowest concentration of SARS-CoV-2 viral copies this assay can detect is 138 copies/mL. A negative result does not preclude SARS-Cov-2 infection and should not be used as the sole basis for treatment or other patient management decisions. A negative result may occur with  improper specimen collection/handling, submission of specimen other than nasopharyngeal swab, presence of viral mutation(s) within the areas targeted by this assay, and inadequate number of viral copies(<138 copies/mL). A negative result must be combined with clinical observations, patient history, and epidemiological information. The expected result is Negative.  Fact Sheet for Patients:  BloggerCourse.com  Fact Sheet for Healthcare Providers:  SeriousBroker.it  This test is no t yet approved or cleared by the [place]  FDA and  has been authorized for detection and/or diagnosis of SARS-CoV-2 by FDA under an Emergency Use Authorization (EUA). This EUA will remain  in effect (meaning this test can be used) for the duration of the COVID-19 declaration under Section 564(b)(1) of the Act, 21 U.S.C.section 360bbb-3(b)(1), unless the authorization is terminated  or revoked sooner.       Influenza A by PCR POSITIVE (A) NEGATIVE Final   Influenza B by PCR NEGATIVE NEGATIVE Final    Comment: (NOTE) The Xpert Xpress SARS-CoV-2/FLU/RSV plus assay is intended as an aid in the diagnosis of influenza from Nasopharyngeal swab specimens and should not be used as a sole basis for treatment. Nasal washings and aspirates are  unacceptable for Xpert Xpress SARS-CoV-2/FLU/RSV testing.  Fact Sheet for Patients: BloggerCourse.com  Fact Sheet for Healthcare Providers: SeriousBroker.it  This test is not yet approved or cleared by  the [place]  FDA and has been authorized for detection and/or diagnosis of SARS-CoV-2 by FDA under an Emergency Use Authorization (EUA). This EUA will remain in effect (meaning this test can be used) for the duration of the COVID-19 declaration under Section 564(b)(1) of the Act, 21 U.S.C. section 360bbb-3(b)(1), unless the authorization is terminated or revoked.     Resp Syncytial Virus by PCR NEGATIVE NEGATIVE Final    Comment: (NOTE) Fact Sheet for Patients: BloggerCourse.com  Fact Sheet for Healthcare Providers: SeriousBroker.it  This test is not yet approved or cleared by the [place]  FDA and has been authorized for detection and/or diagnosis of SARS-CoV-2 by FDA under an Emergency Use Authorization (EUA). This EUA will remain in effect (meaning this test can be used) for the duration of the COVID-19 declaration under Section 564(b)(1) of the Act, 21 U.S.C. section 360bbb-3(b)(1), unless the authorization is terminated or revoked.  Performed at [facility], [address] [place], [place] [address]     Radiology Studies: No results found.     Trejon Duford T. Britley Gashi [facility]  If 7PM-7AM, please contact night-coverage [email or web address] 12/06/2023, 11:34 AM

## 2023-12-06 NOTE — Progress Notes (Signed)
 Discussed with pt and wife MI, stent, restrictions, Effient  importance, diet, exercise, NTG and CRPII. Pt receptive. Will refer to [facility] CRP[facility].  [id number] German Koller BS, ACSM-CEP 12/06/2023 3:20 PM

## 2023-12-07 ENCOUNTER — Telehealth: Payer: Self-pay | Admitting: Cardiology

## 2023-12-07 ENCOUNTER — Other Ambulatory Visit (HOSPITAL_COMMUNITY): Payer: Self-pay

## 2023-12-07 DIAGNOSIS — I214 Non-ST elevation (NSTEMI) myocardial infarction: Secondary | ICD-10-CM | POA: Diagnosis not present

## 2023-12-07 DIAGNOSIS — N1831 Chronic kidney disease, stage 3a: Secondary | ICD-10-CM

## 2023-12-07 DIAGNOSIS — R7989 Other specified abnormal findings of blood chemistry: Secondary | ICD-10-CM | POA: Diagnosis not present

## 2023-12-07 DIAGNOSIS — J101 Influenza due to other identified influenza virus with other respiratory manifestations: Secondary | ICD-10-CM | POA: Diagnosis not present

## 2023-12-07 LAB — CBC
HCT: 32.9 % — ABNORMAL LOW (ref 39.0–52.0)
Hemoglobin: 11.1 g/dL — ABNORMAL LOW (ref 13.0–17.0)
MCH: 30.3 pg (ref 26.0–34.0)
MCHC: 33.7 g/dL (ref 30.0–36.0)
MCV: 89.9 fL (ref 80.0–100.0)
Platelets: 140 10*3/uL — ABNORMAL LOW (ref 150–400)
RBC: 3.66 MIL/uL — ABNORMAL LOW (ref 4.22–5.81)
RDW: 12.9 % (ref 11.5–15.5)
WBC: 5.8 10*3/uL (ref 4.0–10.5)
nRBC: 0 % (ref 0.0–0.2)

## 2023-12-07 LAB — RENAL FUNCTION PANEL
Albumin: 3.4 g/dL — ABNORMAL LOW (ref 3.5–5.0)
Anion gap: 10 (ref 5–15)
BUN: 17 mg/dL (ref 8–23)
CO2: 23 mmol/L (ref 22–32)
Calcium: 8.7 mg/dL — ABNORMAL LOW (ref 8.9–10.3)
Chloride: 105 mmol/L (ref 98–111)
Creatinine, Ser: 1.45 mg/dL — ABNORMAL HIGH (ref 0.61–1.24)
GFR, Estimated: 51 mL/min — ABNORMAL LOW (ref >60)
Glucose, Bld: 103 mg/dL — ABNORMAL HIGH (ref 70–99)
Phosphorus: 3.1 mg/dL (ref 2.5–4.6)
Potassium: 4 mmol/L (ref 3.5–5.1)
Sodium: 138 mmol/L (ref 135–145)

## 2023-12-07 LAB — MAGNESIUM: Magnesium: 1.8 mg/dL (ref 1.7–2.4)

## 2023-12-07 MED ORDER — OSELTAMIVIR PHOSPHATE 30 MG PO CAPS
30.0000 mg | ORAL_CAPSULE | Freq: Two times a day (BID) | ORAL | 0 refills | Status: AC
  Filled 2023-12-07: qty 1, 1d supply, fill #0
  Filled 2023-12-07: qty 2, 1d supply, fill #0

## 2023-12-07 MED ORDER — PRASUGREL HCL 10 MG PO TABS
10.0000 mg | ORAL_TABLET | Freq: Every day | ORAL | 0 refills | Status: DC
  Filled 2023-12-07: qty 30, 30d supply, fill #0

## 2023-12-07 MED ORDER — ASPIRIN 81 MG PO TBEC: 81.0000 mg | DELAYED_RELEASE_TABLET | Freq: Every day | ORAL | Status: DC

## 2023-12-07 MED ORDER — DM-GUAIFENESIN ER 30-600 MG PO TB12: 1.0000 | ORAL_TABLET | Freq: Two times a day (BID) | ORAL | Status: AC

## 2023-12-07 MED ORDER — METOPROLOL SUCCINATE ER 25 MG PO TB24
12.5000 mg | ORAL_TABLET | Freq: Every day | ORAL | 0 refills | Status: DC
  Filled 2023-12-07: qty 45, 90d supply, fill #0

## 2023-12-07 MED FILL — Verapamil HCl IV Soln 2.5 MG/ML: INTRAVENOUS | Qty: 2 | Status: AC

## 2023-12-07 MED FILL — Nitroglycerin IV Soln 100 MCG/ML in D5W: INTRA_ARTERIAL | Qty: 10 | Status: AC

## 2023-12-07 NOTE — Telephone Encounter (Signed)
   Transition of Care Follow-up Phone Call Request    Patient Name: [name] Date of Birth: [birth year] Date of Encounter: 12/07/2023  Primary Care Provider:  Benita Stabile, MD Primary Cardiologist:  Bryan Lemma, MD  [name] has been scheduled for a transition of care follow up appointment with a [facility] provider:  Azalee Course 2/18  Please reach out to [name] within 48 hours of discharge to confirm appointment and review transition of care protocol questionnaire. Anticipated discharge date: 2/11  Laverda Page, NP  12/07/2023, 9:07 AM

## 2023-12-07 NOTE — Care Management Important Message (Signed)
Important Message  Patient Details  Name: [name] MRN: [id number] Date of Birth: [birth year]   Important Message Given:  Yes - Medicare IM     Renie Ora 12/07/2023, 11:36 AM

## 2023-12-07 NOTE — TOC Initial Note (Signed)
Transition of Care [facility]) - Initial/Assessment Note    Patient Details  Name: [name] MRN: [id number] Date of Birth: 22-Apr-1951  Transition of Care [facility]) CM/SW Contact:    Gala Lewandowsky, RN Phone Number: 12/07/2023, 9:42 AM  Clinical Narrative: Patient presented for chest pain. PTA patient was from home with spouse. Patient has PCP and insurance. No home needs identified at this time. Patient to transition home today.                    Expected Discharge Plan: Home/Self Care Barriers to Discharge: No Barriers Identified   Patient Goals and CMS Choice Patient states their goals for this hospitalization and ongoing recovery are:: plan to return home.   Choice offered to / list presented to : NA     Expected Discharge Plan and Services In-house Referral: NA Discharge Planning Services: CM Consult Post Acute Care Choice: NA Living arrangements for the past 2 months: Single Family Home Expected Discharge Date: 12/07/23                 Prior Living Arrangements/Services Living arrangements for the past 2 months: Single Family Home Lives with:: Spouse Patient language and need for interpreter reviewed:: Yes Do you feel safe going back to the place where you live?: Yes      Need for Family Participation in Patient Care: No (Comment) Care giver support system in place?: No (comment)   Criminal Activity/Legal Involvement Pertinent to Current Situation/Hospitalization: No - Comment as needed  Activities of Daily Living   ADL Screening (condition at time of admission) Independently performs ADLs?: Yes (appropriate for developmental age) Is the patient deaf or have difficulty hearing?: No Does the patient have difficulty seeing, even when wearing glasses/contacts?: No Does the patient have difficulty concentrating, remembering, or making decisions?: No  Permission Sought/Granted Permission sought to share information with : Case Manager   Emotional  Assessment Appearance:: Appears stated age   Alcohol / Substance Use: Not Applicable Psych Involvement: No (comment)  Admission diagnosis:  NSTEMI (non-ST elevated myocardial infarction) (HCC) [I21.4] Influenza [J11.1] Patient Active Problem List   Diagnosis Date Noted   CKD (chronic kidney disease), stage III (HCC) 12/06/2023   NSTEMI (non-ST elevated myocardial infarction) (HCC) 12/04/2023   Influenza A 12/04/2023   LFT elevation 12/04/2023   S/P left unicompartmental knee replacement 09/16/2021   Chest pain, non-cardiac 10/11/2019   DOE (dyspnea on exertion) 10/11/2019   Abnormal heart sounds 10/11/2019   Pain in left knee 09/20/2019   Iron deficiency anemia 09/07/2019   Pain in thumb joint with movement of left hand 05/24/2019   Metabolic bone disease 04/25/2019   Renal stone 04/25/2019   Pain of left hand 03/23/2019   Insomnia 09/14/2018   S/P right knee arthroscopy 04/14/18    Absolute anemia 04/05/2017   Restless leg syndrome 12/07/2016   Numbness 12/07/2016   OSA (obstructive sleep apnea) 12/07/2016   Spinal stenosis of cervical region 12/07/2016   Spinal stenosis of lumbar region 06/18/2014   CTS (carpal tunnel syndrome) 12/20/2012   Medial meniscus, posterior horn derangement 12/20/2012   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   Stage 3 chronic kidney disease (HCC) 11/22/2011   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   Essential (primary) hypertension 11/22/2011   PCP:  Benita Stabile, MD Pharmacy:   [facility] [place], [place] - [address] [place] [address] Phone: [phone] Fax: [phone]  [id number] [place], [place] -  [address] AT [facility] OF [address] [place] [address] Phone: [phone] Fax: [phone]  [id number] - [place], [place] - [address] AT [facility] [address] [place]  [address] Phone: [phone] Fax: [phone]  [facility] [address] [place] [address] Phone: [phone] Fax: [phone]  Social Drivers of Health (SDOH) Social History: SDOH Screenings   Food Insecurity: No Food Insecurity (12/04/2023)  Housing: Low Risk  (12/04/2023)  Transportation Needs: No Transportation Needs (12/04/2023)  Utilities: Not At Risk (12/04/2023)  Alcohol Screen: Low Risk  (05/25/2022)  Depression (PHQ2-9): Low Risk  (05/25/2022)  Financial Resource Strain: Low Risk  (05/25/2022)  Physical Activity: Sufficiently Active (05/25/2022)  Social Connections: Moderately Isolated (12/04/2023)  Stress: No Stress Concern Present (05/25/2022)  Tobacco Use: Medium Risk (12/03/2023)   Readmission Risk Interventions     No data to display

## 2023-12-07 NOTE — Progress Notes (Addendum)
Patient Name: [name] Date of Encounter: 12/07/2023 [facility] Cardiologist: Bryan Lemma, MD   Interval Summary  .    No chest pain, still fatigued from the flu but otherwise doing well.   Vital Signs .    Vitals:   12/06/23 1459 12/06/23 1711 12/06/23 1949 12/07/23 0216  BP: 133/75 (!) 141/81 (!) 147/81 129/85  Pulse:  76  90  Resp:  18  18  Temp:  98.5 F (36.9 C) 98.2 F (36.8 C) 98.3 F (36.8 C)  TempSrc:  Oral Oral Oral  SpO2:  98% 99% 98%  Weight:    91.4 kg  Height:        Intake/Output Summary (Last 24 hours) at 12/07/2023 0846 Last data filed at 12/06/2023 1927 Gross per 24 hour  Intake 277.73 ml  Output --  Net 277.73 ml      12/07/2023    2:16 AM 12/06/2023    5:10 AM 12/05/2023    4:09 AM  Last 3 Weights  Weight (lbs) 201 lb 8 oz 201 lb 14.4 oz 202 lb  Weight (kg) 91.4 kg 91.581 kg 91.627 kg      Telemetry/ECG    Sinus Rhythm, PACs - Personally Reviewed  Physical Exam .   GEN: No acute distress.   Neck: No JVD Cardiac: RRR, no murmurs, rubs, or gallops.  Respiratory: Clear to auscultation bilaterally. GI: Soft, nontender, non-distended  MS: No edema Skin: Right radial cath site with bruising, soft hematoma but no bruit.   Assessment & Plan .     73 y.o. male with a hx of HTN, HLD, chronic kidney disease stage 3a who is being seen 12/04/2023 for the evaluation of NSTEMI at the request of the emergency department.    NSTEMI -- presented with chest pain on 2/7. Initial hsTn 309>>2633>>24000 over the weekend. EKG 2/7 without evidence of ischemia -- echo showed LVEF of 50-55%, g1DD, normal RV, lateral wall hypokinesis, mild MR, moderate TR -- Underwent cardiac catheterization 2/10 with culprit lesion being first diagonal, 90% stenosis treated with PCI/DES 1, also with proximal/mid LAD 70% stenosis, FFR 0.79 treated with PCI/DES x 1.  Did have 20 to 30% second diagonal small caliber disease, 60% apical LAD disease to be treated  medically.  Recommendations for DAPT with aspirin/Effient for at least 1 year, then convert to Plavix as monotherapy. 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 He is ok with using the [facility] pharmacy at discharge with his medications.  For questions or updates, please contact  [facility] Please consult [email or web address] for contact info under        Signed, Laverda Page, NP   Patient seen, examined. Available data reviewed. Agree with findings, assessment, and plan as outlined by  Laverda Page, NP.  The patient is independently interviewed and examined.  On my exam, he is alert, oriented, in no distress.  HEENT is normal, JVP is normal, lungs are rhonchorous bilaterally, heart is regular rate and rhythm no murmur gallop, abdomen soft and nontender with no organomegaly, extremities have no edema, right radial cath site is clear.  I have personally reviewed the patient's cardiac catheterization films and he achieved a good result with PCI of the LAD and first diagonal branches.  Plans noted to continue DAPT with aspirin and prasugrel for 1 year then convert to clopidogrel monotherapy thereafter.  Reviewed medication recommendations with the patient.  He is statin intolerant and has failed multiple statins in the past as detailed above.   He will be referred to the lipid clinic for consideration of injectable therapy such as a PCSK9 inhibitor or inclisiran.  Patient stable from a cardiac perspective for hospital discharge.  Cardiology follow-up is arranged.  Tonny Bollman, M.D. 12/07/2023 9:59 AM

## 2023-12-07 NOTE — Discharge Summary (Signed)
Physician Discharge Summary  [name] BMW:[id number] DOB: July 20, 1951 DOA: 12/03/2023  PCP: Benita Stabile, MD  Admit date: 12/03/2023 Discharge date: 12/07/23  Admitted From: Home Disposition: Home Recommendations for Outpatient Follow-up:  Follow up with PCP in in 1 to 2 weeks Cardiology to arrange outpatient follow-up Check blood pressure, CMP and CBC at follow-up Please follow up on the following pending results: Lipoprotein A level.  Home Health: No need identified Equipment/Devices: No need identified  Discharge Condition: Stable CODE STATUS: Full code  Follow-up Information     Benita Stabile, MD. Schedule an appointment as soon as possible for a visit in 1 week(s).   Specialty: Internal Medicine Contact information: [address] [place] [address] [phone]                 Hospital course 73 year old M with PMH of HTN, HLD, OSA with inspire implant, anxiety, depression and RLS presented to [facility], ED with acute onsets chest pain/pressure for 1 day, and admitted for non-STEMI.  He also had myalgia for 3 days prior.  Influenza A PCR was positive.  Troponin elevated to 309>> 2600.  Cardiology consulted.  Patient was started on IV heparin, nitro drip and Tamiflu and transferred to [facility] for further cardiac evaluation.   Troponin peaked at over 24,000 but patient without further chest pain.  TTE with LVEF of 50 to 55%, RWMA, G1 DD and moderate TR. Patient was continued on IV heparin.  He underwent left heart catheterization with DES to proximal-mid LAD and major D1.  He was cleared for discharge on Toprol-XL, Brilinta and aspirin.  HCTZ discontinued per cardiology recommendation.  Patient has history of statin intolerance.  Lipoprotein a level pending.  Plan for PCSK9 inhibitors outpatient.  Cardiology to arrange outpatient follow-up.  See individual problem list below for more.   Problems addressed during this hospitalization Non-STEMI/chest pain:  Denies prior history of chest pain at rest or exertion.  However, noted increased fatigue over the last 6 months.  He runs Clinical cytogeneticist.  Troponin 309>> 2600>> 24,000.  Patient has been chest pain-free since admission.  TTE with RWMA.  LDL 108.  Never smoker.  -S/p LHC with DES to LAD and D1. -Cleared for discharge on Toprol-XL, Brilinta and aspirin. -Cardiology recommends Brilinta and aspirin for 12 months followed by Plavix monotherapy. -Follow lipoprotein A.  History of statin intolerance.  Would benefit from PCSK9 inhibitors outpatient.   Moderate TR -Outpatient follow-up with cardiology   Elevated AST: Likely due to CK and influenza.  Improved. -Recheck CMP outpatient   CKD-3A: Stable -HCTZ discontinued per recommendation by cardiology   Essential hypertension: Normotensive. -Toprol-XL 12.5 mg daily -Discontinue HCTZ -Reassess BP and adjust meds as appropriate   Influenza A infection: Symptomatic for 5 days -Discharged on Tamiflu for 3 additional doses -Mucolytic's and antitussive   OSA s/p Inspire implantation   Anxiety/depression/RLS: Appears anxious. -Continue home Cymbalta, gabapentin and Mirapex   Obesity Body mass index is 30.64 kg/m. -Encourage lifestyle change to lose weight.           Time spent 35 minutes  Vital signs Vitals:   12/06/23 1711 12/06/23 1949 12/07/23 0216 12/07/23 0851  BP: (!) 141/81 (!) 147/81 129/85 123/66  Pulse: 76  90 86  Temp: 98.5 F (36.9 C) 98.2 F (36.8 C) 98.3 F (36.8 C) 98 F (36.7 C)  Resp: 18  18 18   Height:      Weight:   91.4 kg  SpO2: 98% 99% 98% 97%  TempSrc: Oral Oral Oral Oral  BMI (Calculated):   30.65      Discharge exam  GENERAL: No apparent distress.  Nontoxic. HEENT: MMM.  Vision and hearing grossly intact.  NECK: Supple.  No apparent JVD.  RESP:  No IWOB.  Fair aeration bilaterally. CVS:  RRR. Heart sounds normal.  ABD/GI/GU: BS+. Abd soft, NTND.  MSK/EXT:  Moves extremities. No apparent  deformity. No edema.  SKIN: no apparent skin lesion or wound NEURO: Awake and alert. Oriented appropriately.  No apparent focal neuro deficit. PSYCH: Calm. Normal affect.   Discharge Instructions Discharge Instructions     AMB Referral to Advanced Lipid Disorders Clinic   Complete by: As directed    Reason for referral: Patients with statin intolerance (failed 2 statins, one of which must be a high potency statin)   Internal Lipid Clinic Referral Scheduling  Internal lipid clinic referrals are providers within [facility], who wish to refer established patients for routine management (help in starting PCSK9 inhibitor therapy) or advanced therapies.  Internal MD referral criteria:              1. All patients with LDL>190 mg/dL  2. All patients with Triglycerides >500 mg/dL  3. Patients with suspected or confirmed heterozygous familial hyperlipidemia (HeFH) or homozygous familial hyperlipidemia (HoFH)  4. Patients with family history of suspicious for genetic dyslipidemia desiring genetic testing  5. Patients refractory to standard guideline based therapy  6. Patients with statin intolerance (failed 2 statins, one of which must be a high potency statin)  7. Patients who the provider desires to be seen by MD   Internal PharmD referral criteria:   1. Follow-up patients for medication management  2. Follow-up for compliance monitoring  3. Patients for drug education  4. Patients with statin intolerance  5. PCSK9 inhibitor education and prior authorization approvals  6. Patients with triglycerides <500 mg/dL  External Lipid Clinic Referral  External lipid clinic referrals are for providers outside of [facility], considered new clinic patients - automatically routed to MD schedule   Amb Referral to Cardiac Rehabilitation   Complete by: As directed    Diagnosis:  Coronary Stents NSTEMI PTCA     After initial evaluation and assessments completed: Virtual Based Care may be provided  alone or in conjunction with Phase 2 Cardiac Rehab based on patient barriers.: Yes   Intensive Cardiac Rehabilitation (ICR) [facility] location only OR Traditional Cardiac Rehabilitation (TCR) *If criteria for ICR are not met will enroll in TCR [facility] only): Yes   Diet - low sodium heart healthy   Complete by: As directed    Discharge instructions   Complete by: As directed    It has been a pleasure taking care of you!  You were hospitalized due to blockage/narrowing of blood vessels in your heart for which you have been treated with stent placement.  You have been started on medication to keep the stents open.  We have also started you on Tamiflu for influenza infection.  It is very important that you take your medications as prescribed.  Please review your new medication list and the directions on your medications before you take them.  Follow-up with cardiology per their recommendation.  Follow-up with your primary care doctor in 1 to 2 weeks or sooner if needed.  Avoid any over-the-counter pain medication other than plain Tylenol     Take care,   Increase activity slowly   Complete by: As directed  Allergies as of 12/07/2023       Reactions   Statins Other (See Comments)   Muscle pain   Nsaids Other (See Comments)   "Cannot take because of my kidney disease."        Medication List     STOP taking these medications    hydrochlorothiazide 12.5 MG capsule Commonly known as: MICROZIDE       TAKE these medications    aspirin EC 81 MG tablet Take 1 tablet (81 mg total) by mouth daily. Swallow whole. Start taking on: December 08, 2023   benzonatate 200 MG capsule Commonly known as: TESSALON Take 200 mg by mouth 3 (three) times daily as needed.   dextromethorphan-guaiFENesin 30-600 MG 12hr tablet Commonly known as: MUCINEX DM Take 1 tablet by mouth 2 (two) times daily for 5 days.   DULoxetine 30 MG capsule Commonly known as: CYMBALTA Take 30 mg by mouth 2 (two)  times daily.   Eszopiclone 3 MG Tabs Take 3 mg by mouth daily.   gabapentin 300 MG capsule Commonly known as: NEURONTIN Take 300 mg by mouth See admin instructions. One tablet in the morning and afternoon and two tablets at bedtime   metoprolol succinate 25 MG 24 hr tablet Commonly known as: TOPROL-XL Take 0.5 tablets (12.5 mg total) by mouth daily. Start taking on: December 08, 2023   oseltamivir 30 MG capsule Commonly known as: TAMIFLU Take 1 capsule (30 mg total) by mouth 2 (two) times daily for 3 doses.   pramipexole 0.5 MG tablet Commonly known as: MIRAPEX TAKE 1 TABLET AT DINNER AND 1 TABLET AT BEDTIME.   prasugrel 10 MG Tabs tablet Commonly known as: EFFIENT Take 1 tablet (10 mg total) by mouth daily.   zolpidem 12.5 MG CR tablet Commonly known as: AMBIEN CR Take 1 tablet by mouth at bedtime.        Consultations: Cardiology  Procedures/Studies:   CARDIAC CATHETERIZATION Result Date: 12/06/2023   CULPRIT LESION (#1): 1st Diag lesion is 90% stenosed.   A drug-eluting stent was successfully placed using a SYNERGY XD 2.50X12. - Deployed to 2.6 mm; Post intervention, there is a 0% residual stenosis.  TIMI-3 flow maintained   LESION #2: Prox LAD to Mid LAD lesion is 70% stenosed.  TIMI-3 flow   A drug-eluting stent was successfully placed using a SYNERGY XD 3.0X28.-Deployed to 3.2 mm; Post intervention, there is a 0% residual stenosis. TIMI-3 flow maintained   Mid LAD to Dist LAD lesion is 20% stenosed.  Dist LAD-1 lesion is 30% stenosed. Dist LAD-2 lesion is 60% stenosed.   LV end diastolic pressure is moderately elevated. POST-OPERATIVE DIAGNOSIS: Left Dominant System with Two-Vessel CAD: CULPRIT LESION: Major D1 with proximal 90% stenosis (Cath Works FFR 0.56); TIMI-3 flow Successful DES PCI with Synergy XD 2.5 mm x 12 mm stent deployed to 2.6 mm-reduced to 0%, TIMI-3 flow maintained Proximal-Mid LAD segmental (from D1-SP1) 70% stenosis (Cath Works FFR 0.79), TIMI-3 flow  Successful DES PCI with Synergy XD 3.0 mm 28 mm stent deployed to 3.2 mm; reduced to 0% (post PCI cath Works FFR 0.94) TIMI-3 flow maintained Remainder the LAD has a very small caliber D2 with sequential 20 to 30% stenoses followed by an apical 60% stenosis just before distal D3. Large dominant LCx with the major OM1, small LPL 1 and LPL 3 with moderate caliber major LPL 2 and LPDA-free of disease. Small nondominant RCA Preserved LVEF by echocardiogram-no LV gram performed to conserve contrast.  LVEDP estimated  20 mmHg PLAN OF CARE:  Post cath hydration for 6 hours due to solitary kidney; potential discharge later this evening versus tomorrow morning after renal function assessment.  DAPT x 1 year with ASA/Effient, after 1 year will convert to Plavix monotherapy to complete a second year.  Continue GDMT titration for CAD  Bryan Lemma, MD  ECHOCARDIOGRAM COMPLETE Result Date: 12/04/2023    ECHOCARDIOGRAM REPORT   Patient Name:   NAZARIO RUSSOM Date of Exam: 12/04/2023 Medical Rec #:  [id number]       Height:       68.0 in Accession #:    [id number]      Weight:       207.7 lb Date of Birth:  [birth year]        BSA:          2.077 m Patient Age:    72 years        BP:           137/78 mmHg Patient Gender: M               HR:           83 bpm. Exam Location:  Inpatient Procedure: 2D Echo, Cardiac Doppler, Color Doppler and Intracardiac            Opacification Agent Indications:    122-I22.9 Subsequent ST elevation (STEM) and non-ST elevation                 (NSTEMI) myocardial infarction  History:        Patient has prior history of Echocardiogram examinations, most                 recent 11/13/2019. Previous Myocardial Infarction and CAD,                 Signs/Symptoms:Shortness of Breath and Dyspnea; Risk                 Factors:Sleep Apnea and Hypertension.  Sonographer:    Sheralyn Boatman RDCS Referring Phys: [id number] Angie Fava IMPRESSIONS  1. No evidence of LV thrombus. Definity is administered. Left ventricular  ejection fraction, by estimation, is 50 to 55%. The left ventricle has low normal function. The left ventricle demonstrates regional wall motion abnormalities (see scoring diagram/findings for description). Left ventricular diastolic parameters are consistent with Grade I diastolic dysfunction (impaired relaxation).  2. Right ventricular systolic function was not well visualized. The right ventricular size is normal. There is normal pulmonary artery systolic pressure.  3. Right atrial size was moderately dilated.  4. The mitral valve is grossly normal. Mild mitral valve regurgitation. No evidence of mitral stenosis.  5. Tricuspid valve regurgitation is moderate.  6. The aortic valve is tricuspid. Aortic valve regurgitation is not visualized.  7. Aortic dilatation noted. There is mild dilatation of the aortic root, measuring 40 mm. There is borderline dilatation of the ascending aorta, measuring 39 mm.  8. The inferior vena cava is normal in size with greater than 50% respiratory variability, suggesting right atrial pressure of 3 mmHg. Conclusion(s)/Recommendation(s): No left ventricular mural or apical thrombus/thrombi. FINDINGS  Left Ventricle: No evidence of LV thrombus. Definity is administered. Left ventricular ejection fraction, by estimation, is 50 to 55%. The left ventricle has low normal function. The left ventricle demonstrates regional wall motion abnormalities. The left ventricular internal cavity size was normal in size. There is no left ventricular hypertrophy. Left ventricular diastolic parameters are consistent with Grade I diastolic  dysfunction (impaired relaxation). Normal left ventricular filling pressure.  LV Wall Scoring: The entire lateral wall is hypokinetic. The entire anterior wall, entire septum, entire inferior wall, and apex are normal. Right Ventricle: The right ventricular size is normal. No increase in right ventricular wall thickness. Right ventricular systolic function was not well  visualized. There is normal pulmonary artery systolic pressure. The tricuspid regurgitant velocity is  2.51 m/s, and with an assumed right atrial pressure of 3 mmHg, the estimated right ventricular systolic pressure is 28.2 mmHg. Left Atrium: Left atrial size was normal in size. Right Atrium: Right atrial size was moderately dilated. Pericardium: There is no evidence of pericardial effusion. Mitral Valve: The mitral valve is grossly normal. Mild mitral valve regurgitation. No evidence of mitral valve stenosis. Tricuspid Valve: The tricuspid valve is grossly normal. Tricuspid valve regurgitation is moderate . No evidence of tricuspid stenosis. Aortic Valve: The aortic valve is tricuspid. Aortic valve regurgitation is not visualized. Pulmonic Valve: The pulmonic valve was normal in structure. Pulmonic valve regurgitation is trivial. No evidence of pulmonic stenosis. Aorta: Aortic dilatation noted. There is mild dilatation of the aortic root, measuring 40 mm. There is borderline dilatation of the ascending aorta, measuring 39 mm. Venous: The inferior vena cava is normal in size with greater than 50% respiratory variability, suggesting right atrial pressure of 3 mmHg. IAS/Shunts: No atrial level shunt detected by color flow Doppler.  LEFT VENTRICLE PLAX 2D LVIDd:         5.70 cm      Diastology LVIDs:         4.90 cm      LV e' medial:    8.49 cm/s LV PW:         0.90 cm      LV E/e' medial:  9.2 LV IVS:        0.90 cm      LV e' lateral:   11.10 cm/s LVOT diam:     2.50 cm      LV E/e' lateral: 7.0 LV SV:         89 LV SV Index:   43 LVOT Area:     4.91 cm  LV Volumes (MOD) LV vol d, MOD A2C: 91.4 ml LV vol d, MOD A4C: 132.9 ml LV vol s, MOD A2C: 49.8 ml LV vol s, MOD A4C: 72.6 ml LV SV MOD A2C:     41.6 ml LV SV MOD A4C:     132.9 ml LV SV MOD BP:      48.9 ml RIGHT VENTRICLE             IVC RV S prime:     14.90 cm/s  IVC diam: 1.30 cm TAPSE (M-mode): 2.6 cm LEFT ATRIUM             Index        RIGHT ATRIUM            Index LA diam:        2.70 cm 1.30 cm/m   RA Area:     17.20 cm LA Vol (A2C):   35.0 ml 16.85 ml/m  RA Volume:   46.40 ml  22.34 ml/m LA Vol (A4C):   21.3 ml 10.26 ml/m LA Biplane Vol: 27.2 ml 13.10 ml/m  AORTIC VALVE LVOT Vmax:   96.10 cm/s LVOT Vmean:  59.100 cm/s LVOT VTI:    0.182 m  AORTA Ao Root diam: 4.00 cm Ao Asc diam:  3.90 cm MITRAL VALVE  TRICUSPID VALVE MV Area (PHT): 3.96 cm     TR Peak grad:   25.2 mmHg MV Decel Time: 191 msec     TR Vmax:        251.00 cm/s MR PISA:        0.57 cm MR PISA Radius: 0.30 cm     SHUNTS MV E velocity: 78.20 cm/s   Systemic VTI:  0.18 m MV A velocity: 100.73 cm/s  Systemic Diam: 2.50 cm MV E/A ratio:  0.78 Vishnu Priya Mallipeddi Electronically signed by Winfield Rast Mallipeddi Signature Date/Time: 12/04/2023/2:38:51 PM    Final    DG Chest 2 View Result Date: 12/03/2023 CLINICAL DATA:  Chest pain EXAM: CHEST - 2 VIEW COMPARISON:  Chest x-ray 09/09/2022 FINDINGS: Generator overlies the right chest. The heart size and mediastinal contours are within normal limits. Both lungs are clear. There is a healed or healing posterior right seventh rib fracture. IMPRESSION: 1. No active cardiopulmonary disease. 2. Healed or healing posterior right seventh rib fracture. Electronically Signed   By: Darliss Cheney M.D.   On: 12/03/2023 18:51       The results of significant diagnostics from this hospitalization (including imaging, microbiology, ancillary and laboratory) are listed below for reference.     Microbiology: Recent Results (from the past 240 hours)  Resp panel by RT-PCR (RSV, Flu A&B, Covid) Anterior Nasal Swab     Status: Abnormal   Collection Time: 12/03/23  5:56 PM   Specimen: Anterior Nasal Swab  Result Value Ref Range Status   SARS Coronavirus 2 by RT PCR NEGATIVE NEGATIVE Final    Comment: (NOTE) SARS-CoV-2 target nucleic acids are NOT DETECTED.  The SARS-CoV-2 RNA is generally detectable in upper respiratory specimens during the  acute phase of infection. The lowest concentration of SARS-CoV-2 viral copies this assay can detect is 138 copies/mL. A negative result does not preclude SARS-Cov-2 infection and should not be used as the sole basis for treatment or other patient management decisions. A negative result may occur with  improper specimen collection/handling, submission of specimen other than nasopharyngeal swab, presence of viral mutation(s) within the areas targeted by this assay, and inadequate number of viral copies(<138 copies/mL). A negative result must be combined with clinical observations, patient history, and epidemiological information. The expected result is Negative.  Fact Sheet for Patients:  BloggerCourse.com  Fact Sheet for Healthcare Providers:  SeriousBroker.it  This test is no t yet approved or cleared by the [place] FDA and  has been authorized for detection and/or diagnosis of SARS-CoV-2 by FDA under an Emergency Use Authorization (EUA). This EUA will remain  in effect (meaning this test can be used) for the duration of the COVID-19 declaration under Section 564(b)(1) of the Act, 21 U.S.C.section 360bbb-3(b)(1), unless the authorization is terminated  or revoked sooner.       Influenza A by PCR POSITIVE (A) NEGATIVE Final   Influenza B by PCR NEGATIVE NEGATIVE Final    Comment: (NOTE) The Xpert Xpress SARS-CoV-2/FLU/RSV plus assay is intended as an aid in the diagnosis of influenza from Nasopharyngeal swab specimens and should not be used as a sole basis for treatment. Nasal washings and aspirates are unacceptable for Xpert Xpress SARS-CoV-2/FLU/RSV testing.  Fact Sheet for Patients: BloggerCourse.com  Fact Sheet for Healthcare Providers: SeriousBroker.it  This test is not yet approved or cleared by the [place] FDA and has been authorized for detection and/or  diagnosis of SARS-CoV-2 by FDA under an Emergency Use Authorization (EUA). This  EUA will remain in effect (meaning this test can be used) for the duration of the COVID-19 declaration under Section 564(b)(1) of the Act, 21 U.S.C. section 360bbb-3(b)(1), unless the authorization is terminated or revoked.     Resp Syncytial Virus by PCR NEGATIVE NEGATIVE Final    Comment: (NOTE) Fact Sheet for Patients: BloggerCourse.com  Fact Sheet for Healthcare Providers: SeriousBroker.it  This test is not yet approved or cleared by the [place] FDA and has been authorized for detection and/or diagnosis of SARS-CoV-2 by FDA under an Emergency Use Authorization (EUA). This EUA will remain in effect (meaning this test can be used) for the duration of the COVID-19 declaration under Section 564(b)(1) of the Act, 21 U.S.C. section 360bbb-3(b)(1), unless the authorization is terminated or revoked.  Performed at [facility], [address], [place], [place] [address]      Labs:  CBC: Recent Labs  Lab 12/03/23 1806 12/04/23 0449 12/05/23 0403 12/06/23 0407 12/07/23 0401  WBC 5.6 5.6 4.1 5.0 5.8  HGB 12.8* 12.5* 11.3* 11.8* 11.1*  HCT 37.9* 37.0* 33.5* 34.7* 32.9*  MCV 90.9 90.2 90.3 89.0 89.9  PLT 186 171 155 174 140*   BMP &GFR Recent Labs  Lab 12/03/23 1806 12/04/23 0449 12/05/23 0753 12/06/23 0407 12/07/23 0401 12/07/23 0402  NA 136 138 135 137  --  138  K 3.4* 3.8 3.9 3.9  --  4.0  CL 99 98 102 102  --  105  CO2 27 23 24 24   --  23  GLUCOSE 98 113* 100* 114*  --  103*  BUN 21 19 18 19   --  17  CREATININE 1.46* 1.47* 1.54* 1.32*  --  1.45*  CALCIUM 9.3 9.4 8.3* 8.8*  --  8.7*  MG  --  1.8  --  1.9 1.8  --   PHOS  --   --  2.6  --   --  3.1   Estimated Creatinine Clearance: 49.1 mL/min (A) (by C-G formula based on SCr of 1.45 mg/dL (H)). Liver & Pancreas: Recent Labs  Lab 12/04/23 0449 12/05/23 0403 12/05/23 0753  12/06/23 0407 12/07/23 0402  AST 161* 221*  --  160*  --   ALT 34 43  --  44  --   ALKPHOS 52 41  --  44  --   BILITOT 0.6 0.6  --  0.7  --   PROT 6.3* 5.7*  --  6.2*  --   ALBUMIN 3.7 3.3* 3.2* 3.6 3.4*   No results for input(s): "LIPASE", "AMYLASE" in the last 168 hours. No results for input(s): "AMMONIA" in the last 168 hours. Diabetic: Recent Labs    12/05/23 0403  HGBA1C 5.4   No results for input(s): "GLUCAP" in the last 168 hours. Cardiac Enzymes: Recent Labs  Lab 12/04/23 0449 12/05/23 0403 12/06/23 0407  CKTOTAL 1,486* 1,063* 635*   No results for input(s): "PROBNP" in the last 8760 hours. Coagulation Profile: No results for input(s): "INR", "PROTIME" in the last 168 hours. Thyroid Function Tests: No results for input(s): "TSH", "T4TOTAL", "FREET4", "T3FREE", "THYROIDAB" in the last 72 hours. Lipid Profile: Recent Labs    12/05/23 0403  CHOL 158  HDL 38*  LDLCALC 108*  TRIG 58  CHOLHDL 4.2   Anemia Panel: Recent Labs    12/05/23 0403  FERRITIN 203   Urine analysis: No results found for: "COLORURINE", "APPEARANCEUR", "LABSPEC", "PHURINE", "GLUCOSEU", "HGBUR", "BILIRUBINUR", "KETONESUR", "PROTEINUR", "UROBILINOGEN", "NITRITE", "LEUKOCYTESUR" Sepsis Labs: Invalid input(s): "PROCALCITONIN", "LACTICIDVEN"  SIGNED:  [name], MD  [facility] 12/07/2023, 11:44 AM

## 2023-12-07 NOTE — Telephone Encounter (Signed)
Called and made patient aware that he has transition of care follow up appointment on 12/14/23 @ 8:25 am with Azalee Course, APP at [facility]. Patient verbalized an understanding.

## 2023-12-08 ENCOUNTER — Telehealth: Payer: Self-pay

## 2023-12-08 LAB — LIPOPROTEIN A (LPA): Lipoprotein (a): 70.7 nmol/L — ABNORMAL HIGH (ref <75.0)

## 2023-12-08 NOTE — Transitions of Care (Post Inpatient/ED Visit) (Signed)
   12/08/2023  Name: [name] MRN: [id number] DOB: 1951/08/22  Today's TOC FU Call Status: Today's TOC FU Call Status:: Unsuccessful Call (1st Attempt) Unsuccessful Call (1st Attempt) Date: 12/08/23  Attempted to reach the patient regarding the most recent Inpatient/ED visit. Patient was called in an Outreach attempt to offer VBCI  30-day TOC program. Pt is eligible for program due to potential risk for readmission and/or high utilization. Unfortunately, I was not able to speak with the patient in regards to recent hospital discharge   Left a HIPAA compliant phone message message for patient including VBCI CM contact information with request for a call back in regard to recent hospital discharge    Follow Up Plan: Additional outreach attempts will be made to reach the patient to complete the Transitions of Care (Post Inpatient/ED visit) call.    Susa Loffler , BSN, RN [facility]   VBCI-Population Health RN Care Manager Direct Dial [phone]  Fax: [phone] Website: [email or web address]

## 2023-12-09 ENCOUNTER — Telehealth: Payer: Self-pay

## 2023-12-09 NOTE — Transitions of Care (Post Inpatient/ED Visit) (Signed)
   12/09/2023  Name: [name] MRN: [id number] DOB: [birth year]  Today's TOC FU Call Status: Today's TOC FU Call Status:: Unsuccessful Call (2nd Attempt) Unsuccessful Call (1st Attempt) Date: 12/08/23 Unsuccessful Call (2nd Attempt) Date: 12/09/23  Attempted to reach the patient regarding the most recent Inpatient/ED visit. Patient was called in an Outreach attempt to offer VBCI  30-day TOC program. Pt is eligible for program due to potential risk for readmission and/or high utilization. Unfortunately, I was not able to speak with the patient in regards to recent hospital discharge   Left a HIPAA compliant phone message message for patient including VBCI CM contact information with request for a call back in regard to recent hospital discharge   Follow Up Plan: Additional outreach attempts will be made to reach the patient to complete the Transitions of Care (Post Inpatient/ED visit) call.   Susa Loffler , BSN, RN [facility]   VBCI-Population Health RN Care Manager Direct Dial [phone]  Fax: [phone] Website: [email or web address]

## 2023-12-10 ENCOUNTER — Telehealth: Payer: Self-pay

## 2023-12-10 ENCOUNTER — Telehealth: Payer: Self-pay | Admitting: Cardiology

## 2023-12-10 MED ORDER — NITROGLYCERIN 0.4 MG SL SUBL: 0.4000 mg | SUBLINGUAL_TABLET | SUBLINGUAL | 11 refills | Status: AC | PRN

## 2023-12-10 NOTE — Telephone Encounter (Signed)
Pt was seen in the hospital and was told he needs a rx for Nitroglycerin. Please advise. He was like it sent to [facility] - [place], [place] - [address] Phone: [phone]  Fax: [phone]

## 2023-12-10 NOTE — Transitions of Care (Post Inpatient/ED Visit) (Signed)
12/10/2023  Name: [name] MRN: [id number] DOB: 23-Feb-1951  Today's TOC FU Call Status: Today's TOC FU Call Status:: Successful TOC FU Call Completed Unsuccessful Call (1st Attempt) Date: 12/08/23 Unsuccessful Call (2nd Attempt) Date: 12/09/23 [facility] FU Call Complete Date: 12/10/23 Patient's Name and Date of Birth confirmed.  Transition Care Management Follow-up Telephone Call How have you been since you were released from the hospital?: Better Any questions or concerns?: Yes Patient Questions/Concerns:: Did not get NTG on discharge  Items Reviewed: Did you receive and understand the discharge instructions provided?: Yes Medications obtained,verified, and reconciled?: Yes (Medications Reviewed) Any new allergies since your discharge?: No Dietary orders reviewed?: Yes Type of Diet Ordered:: Reg Heart Healthy Do you have support at home?: Yes People in Home: spouse Name of Support/Comfort Primary Source: [name]  Medications Reviewed Today: Medications Reviewed Today     Reviewed by [name], RN (Registered Nurse) on 12/10/23 at 1000  Med List Status: <None>   Medication Order Taking? Sig Documenting Provider Last Dose Status Informant  aspirin EC 81 MG tablet [id number] Yes Take 1 tablet (81 mg total) by mouth daily. Swallow whole. [name], MD Taking Active   benzonatate (TESSALON) 200 MG capsule [id number] Yes Take 200 mg by mouth 3 (three) times daily as needed. [provider] Taking Active Self, Pharmacy Records  dextromethorphan-guaiFENesin [facility] DM) 30-600 MG 12hr tablet [id number] Yes Take 1 tablet by mouth 2 (two) times daily for 5 days. [name], MD Taking Active   DULoxetine (CYMBALTA) 30 MG capsule [id number] Yes Take 30 mg by mouth 2 (two) times daily. [provider] Taking Active Self, Pharmacy Records  Eszopiclone 3 MG TABS [id number] No Take 3 mg by mouth daily.  Patient not taking: Reported on 12/04/2023   [provider] Not Taking Active Self, Pharmacy Records  gabapentin (NEURONTIN) 300 MG capsule [id number] Yes Take 300 mg by mouth See admin instructions. One tablet in the morning and afternoon and two tablets at bedtime [provider] Taking Active Self, Pharmacy Records  metoprolol succinate (TOPROL-XL) 25 MG 24 hr tablet [id number] Yes Take 0.5 tablets (12.5 mg total) by mouth daily. [name], MD Taking Active   pramipexole (MIRAPEX) 0.5 MG tablet [id number] Yes TAKE 1 TABLET AT DINNER AND 1 TABLET AT BEDTIME. [name], [name], MD Taking Active Self, Pharmacy Records           Med Note [facility] Alinda Dooms A   Sat Dec 04, 2023  1:41 AM) Pt is requesting this medication at the time of interview.   prasugrel (EFFIENT) 10 MG TABS tablet [id number] Yes Take 1 tablet (10 mg total) by mouth daily. [name], MD Taking Active   zolpidem (AMBIEN CR) 12.5 MG CR tablet [id number] Yes Take 1 tablet by mouth at bedtime. [provider] Taking Active Self, Pharmacy Records          Medication reconciliation / review completed based on most recent discharge summary and EHR medication list. Confirmed patient is taking all newly prescribed medications as instructed (any discrepancies are noted in review section)   Patient / Caregiver is aware of any changes to and / or  any dosage adjustments to medication regimen. Patient/ Caregiver denies questions at this time and reports no barriers to medication adherence.   Home Care and Equipment/Supplies: Were Home Health Services Ordered?: Yes Name of Home Health Agency:: [facility] Referral to Cardiac Rehabilitation Complete by:  [facility] Has  Agency set up a time to come to your home?: No EMR reviewed for Home Health Orders: Orders present/patient has not received call (refer to CM for follow-up) Any new equipment or medical supplies ordered?: No  Functional Questionnaire: Do you need assistance with bathing/showering  or dressing?: No Do you need assistance with meal preparation?: No Do you need assistance with eating?: No Do you have difficulty maintaining continence: No Do you need assistance with getting out of bed/getting out of a chair/moving?: No Do you have difficulty managing or taking your medications?: No  Follow up appointments reviewed: PCP Follow-up appointment confirmed?: Yes Date of PCP follow-up appointment?: 12/17/23 Follow-up Provider: Dr Nita Sells Specialist [facility] Follow-up appointment confirmed?: Yes Date of Specialist follow-up appointment?: 12/14/23 Follow-Up Specialty Provider:: Cardiology-[facility] and Pending AMB Referral to [facility] Complete by: As directed  [facility] Do you need transportation to your follow-up appointment?: No Do you understand care options if your condition(s) worsen?: Yes-patient verbalized understanding  SDOH Interventions Today    Flowsheet Row Most Recent Value  SDOH Interventions   Food Insecurity Interventions Intervention Not Indicated  Housing Interventions Intervention Not Indicated  Transportation Interventions Intervention Not Indicated, Patient Resources (Friends/Family)  Utilities Interventions Intervention Not Indicated      Interventions Today    Flowsheet Row Most Recent Value  Chronic Disease   Chronic disease during today's visit Other  [NSTEMI Flu]  General Interventions   General Interventions Discussed/Reviewed General Interventions Discussed, General Interventions Reviewed, Doctor Visits  Doctor Visits Discussed/Reviewed PCP, Specialist, Doctor Visits Reviewed, Doctor Visits Discussed  PCP/Specialist Visits Compliance with follow-up visit  Exercise Interventions   Exercise Discussed/Reviewed Physical Activity  Nutrition Interventions   Nutrition Discussed/Reviewed Nutrition Discussed, Nutrition Reviewed  Pharmacy Interventions    Pharmacy Dicussed/Reviewed Medications and their functions  [did not have NTG ordered on discharge]        Benefits reviewed  Based on current information and Insurance plan -Reviewed benefits accessible to patient, including details about eligibility options for care and  available value based care options  if any areas of needs were identified.  Reviewed patient/  caregiver's ability to access and / or  ability with navigating the benefits system..Amb Referral made if indicted , refer to orders section of note for details   Reviewed goals for care Patient/ Caregiver  verbalizes understanding of instructions and care plan provided. Patient / Caregiver was encouraged to make informed decisions about their care, actively participate in managing their health condition, and implement lifestyle changes as needed to promote independence and self-management of health care. There were no reported  barriers to care.   TOC program  Patient is at high risk for readmission and/or has history of  high utilization  Discussed VBCI  TOC program and weekly calls to patient to assess condition/status, medication management  and provide support/education as indicated . Patient/ Caregiver voiced understanding and declined enrollment in the 30-day TOC Program.     He has follow-up visits with PCP and cardiology He is waiting for calls from [facility]  [facility]  He reports he was supposed to have NTG ordered on discharge but is is not noted he will call Cardiology office today to request   The patient has been provided with contact information for the care management team and has been advised to call with any health-related questions or concerns. Follow up as indicated  with Care Team , or sooner should any new problems arise.     Susa Loffler , BSN, RN [facility]   VBCI-Population Health RN Care Manager Direct Dial [phone]   Fax: [phone] Website: [email or web address]

## 2023-12-10 NOTE — Telephone Encounter (Signed)
Called patient left message on personal voice mail NTG prescription sent to [facility].

## 2023-12-13 NOTE — Progress Notes (Unsigned)
Cardiology Office Note:  .   Date:  12/14/2023  ID:  Charlotte Sanes, DOB 1951-09-11, MRN [id number] PCP: Benita Stabile, MD  [facility] Providers Cardiologist:  Bryan Lemma, MD {  History of Present Illness: .   [name] is a 73 y.o. male NSTEMI 12/03/2023 status post DES to D1, proximal-mid LAD, hypertension, hyperlipidemia, CKD, OSA stable on CPAP?Marland Kitchen  Followed by Dr. Herbie Baltimore however has not been seen since 2021 for follow-up of his chest pain and DOE.  At that time he had an echocardiogram and a stress test which were both reassuring and normal.  Chest pain thought to be noncardiac.  His DOE thought more related to deconditioning.  Patient was just recently admitted to the hospital on 02/07 for NSTEMI.  EKG without ischemic changes, troponins peaked at 24,000+.  Echocardiogram with preserved EF 50 to 55%, normal RV function and lateral wall hypokinesis.  Received 2 stents 121 and to his proximal/mid LAD.  90% and 70% stenosis respectively.  Otherwise small caliber disease 20 to 30% in the second diagonal arteries.  60% apical LAD disease treated medically.  He was discharged on aspirin/Effient with recommendations to maintain for at least 1 year and then convert to Plavix monotherapy.  Also positive for flu started on Tamiflu.  Also reportedly to be statin intolerant failing multiple statins in the past.  Also referred to lipid clinic for PCSK9 inhibitor.  Since discharge patient has done very well without any recurrences of his chest pain.  Still getting over the flu but symptoms are improving.  Unfortunately he has lost about 15 pounds due to lack of appetite but feeling better overall.  He is active, determined to move more and be more stringent on his diet.  Works as a Licensed conveyancer and goes on walks.  Continued to encourage physical activity.  Has not needed to use his nitroglycerin.  ROS: Denies: Chest pain, shortness of breath, orthopnea, peripheral edema, palpitations, decreased  exercise intolerance, palpitations  Studies Reviewed: Marland Kitchen      EKG here sinus rhythm with no acute ST-T wave changes.  All labs from previous hospitalization  Risk Assessment/Calculations:          Physical Exam:   VS:  BP 112/78   Pulse 93   Ht 5\' 8"  (1.727 m)   Wt 198 lb 6.4 oz (90 kg)   SpO2 97%   BMI 30.17 kg/m    Wt Readings from Last 3 Encounters:  12/14/23 198 lb 6.4 oz (90 kg)  12/07/23 201 lb 8 oz (91.4 kg)  07/13/23 211 lb 6.4 oz (95.9 kg)    GEN: Well nourished, well developed in no acute distress NECK: No JVD; No carotid bruits CARDIAC: RRR, no murmurs, rubs, gallops RESPIRATORY:  Clear to auscultation without rales, wheezing or rhonchi  ABDOMEN: Soft, non-tender, non-distended EXTREMITIES:  No edema; No deformity   ASSESSMENT AND PLAN: .    NSTEMI DES to D1/proximal-mid LAD Cath also noted 60% apical LAD disease, 20 to 30% second diagonal small caliber disease.  Treated medically.  Echocardiogram showing preserved EF, poorly visualized RV.  Lateral wall hypokinesis.No anginal complaints, compliant with his medications. Continue DAPT for at least 1 year with aspirin and Effient 10 mg started 11/2023.  After 1 year Plavix monotherapy.  100% compliance, reiterated importance of under interruption.  Able to afford. Continue Toprol-XL 12.5 mg daily, has not needed nitroglycerin.  Continue aggressive secondary risk reduction.  A1c 5.4%. Will be seeing cardiac  rehab.  Hyperlipidemia LDL 108.  Repeat lipid panel and April 2025.  LDL goal less than 55. Sending referral to lipid clinic for PCSK9 inhibitor, statin intolerant and has myalgias.    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  Repeat in 1 year  Hypertension Well-controlled.  On beta-blocker.  Stopped hydrochlorothiazide. Will keep log   OSA on inspire (compliant)  CKD Follows with nephrology  Flu Noted during hospitalization, recovering but improving.   Cardiac Rehabilitation  Eligibility Assessment  The patient is ready to start cardiac rehabilitation from a cardiac standpoint.       Dispo: 4 months  Signed, Abagail Kitchens, PA-C

## 2023-12-14 ENCOUNTER — Encounter: Payer: Self-pay | Admitting: Physician Assistant

## 2023-12-14 ENCOUNTER — Ambulatory Visit: Payer: Medicare Other | Attending: Physician Assistant | Admitting: Cardiology

## 2023-12-14 VITALS — BP 112/78 | HR 93 | Ht 68.0 in | Wt 198.4 lb

## 2023-12-14 DIAGNOSIS — N1831 Chronic kidney disease, stage 3a: Secondary | ICD-10-CM | POA: Diagnosis not present

## 2023-12-14 DIAGNOSIS — I214 Non-ST elevation (NSTEMI) myocardial infarction: Secondary | ICD-10-CM | POA: Insufficient documentation

## 2023-12-14 DIAGNOSIS — J101 Influenza due to other identified influenza virus with other respiratory manifestations: Secondary | ICD-10-CM | POA: Insufficient documentation

## 2023-12-14 DIAGNOSIS — E785 Hyperlipidemia, unspecified: Secondary | ICD-10-CM | POA: Diagnosis not present

## 2023-12-14 DIAGNOSIS — Z789 Other specified health status: Secondary | ICD-10-CM | POA: Insufficient documentation

## 2023-12-14 DIAGNOSIS — I10 Essential (primary) hypertension: Secondary | ICD-10-CM | POA: Insufficient documentation

## 2023-12-14 DIAGNOSIS — G4733 Obstructive sleep apnea (adult) (pediatric): Secondary | ICD-10-CM | POA: Insufficient documentation

## 2023-12-14 NOTE — Patient Instructions (Addendum)
Medication Instructions:  NO CHANGES *If you need a refill on your cardiac medications before your next appointment, please call your pharmacy*   Lab Work: NO LABS If you have labs (blood work) drawn today and your tests are completely normal, you will receive your results only by: MyChart Message (if you have MyChart) OR A paper copy in the mail If you have any lab test that is abnormal or we need to change your treatment, we will call you to review the results.   Testing/Procedures: NO TESTING   Follow-Up: At [facility], you and your health needs are our priority.  As part of our continuing mission to provide you with exceptional heart care, we have created designated Provider Care Teams.  These Care Teams include your primary Cardiologist (physician) and Advanced Practice Providers (APPs -  Physician Assistants and Nurse Practitioners) who all work together to provide you with the care you need, when you need it.  Your next appointment:   4 month(s)  Provider:   Bryan Lemma, MD   Other Instructions You have been referred to lipid clinic for statin intolerance

## 2023-12-15 ENCOUNTER — Encounter (HOSPITAL_COMMUNITY): Payer: Self-pay

## 2023-12-17 ENCOUNTER — Telehealth: Payer: Self-pay

## 2023-12-17 ENCOUNTER — Ambulatory Visit (INDEPENDENT_AMBULATORY_CARE_PROVIDER_SITE_OTHER): Payer: Medicare Other | Admitting: Internal Medicine

## 2023-12-17 ENCOUNTER — Encounter: Payer: Self-pay | Admitting: Internal Medicine

## 2023-12-17 VITALS — BP 135/82 | HR 92 | Ht 68.0 in | Wt 199.6 lb

## 2023-12-17 DIAGNOSIS — G2581 Restless legs syndrome: Secondary | ICD-10-CM | POA: Diagnosis not present

## 2023-12-17 DIAGNOSIS — G4700 Insomnia, unspecified: Secondary | ICD-10-CM

## 2023-12-17 DIAGNOSIS — I77810 Thoracic aortic ectasia: Secondary | ICD-10-CM | POA: Diagnosis not present

## 2023-12-17 DIAGNOSIS — G4733 Obstructive sleep apnea (adult) (pediatric): Secondary | ICD-10-CM

## 2023-12-17 DIAGNOSIS — G8929 Other chronic pain: Secondary | ICD-10-CM

## 2023-12-17 DIAGNOSIS — I214 Non-ST elevation (NSTEMI) myocardial infarction: Secondary | ICD-10-CM | POA: Diagnosis not present

## 2023-12-17 DIAGNOSIS — I2510 Atherosclerotic heart disease of native coronary artery without angina pectoris: Secondary | ICD-10-CM

## 2023-12-17 DIAGNOSIS — I10 Essential (primary) hypertension: Secondary | ICD-10-CM | POA: Diagnosis not present

## 2023-12-17 DIAGNOSIS — J101 Influenza due to other identified influenza virus with other respiratory manifestations: Secondary | ICD-10-CM | POA: Diagnosis not present

## 2023-12-17 DIAGNOSIS — Z9861 Coronary angioplasty status: Secondary | ICD-10-CM | POA: Diagnosis not present

## 2023-12-17 DIAGNOSIS — M7918 Myalgia, other site: Secondary | ICD-10-CM

## 2023-12-17 DIAGNOSIS — N1831 Chronic kidney disease, stage 3a: Secondary | ICD-10-CM

## 2023-12-17 DIAGNOSIS — E782 Mixed hyperlipidemia: Secondary | ICD-10-CM

## 2023-12-17 DIAGNOSIS — Z789 Other specified health status: Secondary | ICD-10-CM

## 2023-12-17 MED ORDER — REPATHA SURECLICK 140 MG/ML ~~LOC~~ SOAJ: 140.0000 mg | SUBCUTANEOUS | 2 refills | Status: DC

## 2023-12-17 NOTE — Patient Instructions (Signed)
It was a pleasure to see you today.  Thank you for giving [place] the opportunity to be involved in your care.  Below is a brief recap of your visit and next steps.  We will plan to see you again in 3 months.  Summary You have established care today Start Repatha for high cholesterol with recent heart attack and known history of statin intolerance Neurology referral placed Follow up in 3 months for routine care

## 2023-12-17 NOTE — Progress Notes (Signed)
 New Patient Office Visit  Subjective    Patient ID: [name], male    DOB: 1951-03-20  Age: 73 y.o. MRN: [id number]  CC:  Chief Complaint  Patient presents with   Establish Care    HPI [name] presents to establish care.  He is a 73 year old male with a past medical history significant for CAD with recent NSTEMI and PCI with DES to proximal-mid LAD and D1, HLD, HTN, OSA with inspire implant, CKD, MDD, and insomnia.  Previously followed by Johnny Melena, MD. [name] reports feeling well today.  He is asymptomatic and has no acute concerns or discuss aside from desiring to establish care.  He is currently retired.  Denies tobacco and alcohol use.  Endorses smoking marijuana occasionally to relieve RLS symptoms.  Documented family history of bladder cancer (father) and lung cancer (brother).  Recent hospital admission 2/7 - 2/11 after presenting with chest pain and pressure x 1 day.  Meeting criteria for NSTEMI.  Transferred to [facility] for cardiac evaluation.  Underwent LHC on 2/10 with stents placed at proximal-mid LAD (70% lesion) and D1 (90% lesion).  He also tested positive for influenza A.  Treated with Tamiflu.  He endorses symptomatic improvement today.  No chest pain since hospital discharge.  Discharged on DAPT (ASA/Effient).  Seen by cardiology for follow-up 2/18.   Outpatient Encounter Medications as of 12/17/2023  Medication Sig   aspirin EC 81 MG tablet Take 1 tablet (81 mg total) by mouth daily. Swallow whole.   DULoxetine (CYMBALTA) 30 MG capsule Take 30 mg by mouth 2 (two) times daily.   Evolocumab (REPATHA SURECLICK) 140 MG/ML SOAJ Inject 140 mg into the skin every 14 (fourteen) days.   gabapentin (NEURONTIN) 300 MG capsule Take 300 mg by mouth See admin instructions. One tablet in the morning and afternoon and two tablets at bedtime   metoprolol succinate (TOPROL-XL) 25 MG 24 hr tablet Take 0.5 tablets (12.5 mg total) by mouth daily.   nitroGLYCERIN  (NITROSTAT) 0.4 MG SL tablet Place 1 tablet (0.4 mg total) under the tongue every 5 (five) minutes as needed for chest pain.   pramipexole (MIRAPEX) 0.5 MG tablet TAKE 1 TABLET AT DINNER AND 1 TABLET AT BEDTIME.   prasugrel (EFFIENT) 10 MG TABS tablet Take 1 tablet (10 mg total) by mouth daily.   zolpidem (AMBIEN CR) 12.5 MG CR tablet Take 1 tablet by mouth at bedtime.   [DISCONTINUED] benzonatate (TESSALON) 200 MG capsule Take 200 mg by mouth 3 (three) times daily as needed. (Patient not taking: Reported on 12/17/2023)   [DISCONTINUED] Eszopiclone 3 MG TABS Take 3 mg by mouth daily. (Patient not taking: Reported on 12/04/2023)   No facility-administered encounter medications on file as of 12/17/2023.   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   Sleep apnea    uses Cpap nightly   Past Surgical History:  Procedure Laterality Date   BACK SURGERY     lumbar 2020   CATARACT EXTRACTION     10-15 years ago   COLONOSCOPY  05/25/2012   Procedure: COLONOSCOPY;  Surgeon: Malissa Hippo, MD;  Location: [facility];  Service:  Endoscopy;  Laterality: N/A;  1030   COLONOSCOPY N/A 05/14/2017   Procedure: COLONOSCOPY;  Surgeon: Malissa Hippo, MD;  Location: [facility];  Service: Endoscopy;  Laterality: N/A;  10:10   CORONARY PRESSURE/FFR WITH 3D MAPPING N/A 12/06/2023   Procedure: Coronary Pressure/FFR w/3D Mapping;  Surgeon: Marykay Lex, MD;  Location: [facility] INVASIVE CV LAB;  Service: Cardiovascular;  Laterality: N/A;   CORONARY STENT INTERVENTION N/A 12/06/2023   Procedure: CORONARY STENT INTERVENTION;  Surgeon: Marykay Lex, MD;  Location: [facility] INVASIVE CV LAB;  Service: Cardiovascular;  Laterality: N/A;   CYSTOSCOPY/RETROGRADE/URETEROSCOPY  Right 06/07/2014   Procedure: CYSTOSCOPY, RIGHT RETROGRADE, RIGHT URETEROSCOPY, RIGHT URETERAL BALLOON DILATATION, BIOPSY, RIGHT URETERAL STENT;  Surgeon: Chelsea Aus, MD;  Location: [facility];  Service: Urology;  Laterality: Right;   DRUG INDUCED ENDOSCOPY Right 07/21/2022   Procedure: DRUG INDUCED ENDOSCOPY;  Surgeon: Christia Reading, MD;  Location: [facility];  Service: ENT;  Laterality: Right;   I & D EXTREMITY Left 05/01/2021   Procedure: LEFT INDEX AND LONG FINGER AMPUTATION AND RING FINGER NAIL BED REPAIR.;  Surgeon: Betha Loa, MD;  Location: [facility];  Service: Orthopedics;  Laterality: Left;  LEFT INDEX AND LONG FINGER AMPUTATION AND RING FINGER NAIL BED REPAIR.   IMPLANTATION OF HYPOGLOSSAL NERVE STIMULATOR Right 09/09/2022   Procedure: IMPLANTATION OF HYPOGLOSSAL NERVE STIMULATOR;  Surgeon: Christia Reading, MD;  Location: [facility];  Service: ENT;  Laterality: Right;   kidney stone  1983   Had partial right nephrectomy   KNEE ARTHROSCOPY WITH MEDIAL MENISECTOMY Right 04/14/2018   Procedure: KNEE ARTHROSCOPY WITH MEDIAL MENISECTOMY;  Surgeon: Vickki Hearing, MD;  Location: [facility];  Service: Orthopedics;  Laterality: Right;   left foot surgery  2010   repair of joint   LEFT HEART CATH AND CORONARY ANGIOGRAPHY N/A 12/06/2023   Procedure: LEFT HEART CATH AND CORONARY ANGIOGRAPHY;  Surgeon: Marykay Lex, MD;  Location: [facility] INVASIVE CV LAB;  Service: Cardiovascular;  Laterality: N/A;   PARTIAL KNEE ARTHROPLASTY Left 09/16/2021   Procedure: UNICOMPARTMENTAL KNEE MEDIALLY;  Surgeon: Durene Romans, MD;  Location: [facility];  Service: Orthopedics;  Laterality: Left;   WRIST SURGERY Bilateral    Family History  Problem Relation Age of Onset   Cancer Father        Bladder cancer   Lung cancer Brother    Healthy Mother    Healthy Brother    Healthy Brother    Social History   Socioeconomic History   Marital status: Married     Spouse name: [name]   Number of children: Not on file   Years of education: 18   Highest education level: Master's degree (e.g., MA, MS, MEng, MEd, MSW, MBA)  Occupational History   Occupation: Retired    Comment: Environmental manager for [employer]  Tobacco Use   Smoking status: Former    Types: Gaffer exposure: Never   Smokeless tobacco: Never  Vaping Use   Vaping status: Never Used  Substance and Sexual Activity   Alcohol use: Yes    Comment: rare   Drug use: No   Sexual activity: Yes    Birth control/protection: None  Other Topics Concern   Not on file  Social History Narrative   He is married.  Has no children.  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     December 2020: He said he used to walk about 2 to 3 miles  a day with his wife, but now she keeps going and he has to stop because of dyspnea.   Social Drivers of Corporate investment banker Strain: Low Risk  (05/25/2022)   Overall Financial Resource Strain (CARDIA)    Difficulty of Paying Living Expenses: Not hard at all  Food Insecurity: No Food Insecurity (12/10/2023)   Hunger Vital Sign    Worried About Running Out of Food in the Last Year: Never true    Ran Out of Food in the Last Year: Never true  Transportation Needs: No Transportation Needs (12/10/2023)   PRAPARE - Administrator, Civil Service (Medical): No    Lack of Transportation (Non-Medical): No  Physical Activity: Sufficiently Active (05/25/2022)   Exercise Vital Sign    Days of Exercise per Week: 5 days    Minutes of Exercise per Session: 30 min  Stress: No Stress Concern Present (05/25/2022)   [employer] of Occupational Health - Occupational Stress Questionnaire    Feeling of Stress : Only a little  Social Connections: Moderately Isolated (12/04/2023)   Social Connection and Isolation Panel [NHANES]    Frequency of Communication with Friends and Family: More than three times a week    Frequency of Social Gatherings with  Friends and Family: Once a week    Attends Religious Services: Never    Database administrator or Organizations: No    Attends Banker Meetings: Never    Marital Status: Married  Catering manager Violence: Not At Risk (12/10/2023)   Humiliation, Afraid, Rape, and Kick questionnaire    Fear of Current or Ex-Partner: No    Emotionally Abused: No    Physically Abused: No    Sexually Abused: No   Review of Systems  Constitutional:  Negative for chills and fever.  HENT:  Negative for sore throat.   Respiratory:  Negative for cough and shortness of breath.   Cardiovascular:  Negative for chest pain, palpitations and leg swelling.  Gastrointestinal:  Negative for abdominal pain, blood in stool, constipation, diarrhea, nausea and vomiting.  Genitourinary:  Negative for dysuria and hematuria.  Musculoskeletal:  Negative for myalgias.  Skin:  Negative for itching and rash.  Neurological:  Negative for dizziness and headaches.  Psychiatric/Behavioral:  Negative for depression and suicidal ideas.    Objective    BP 135/82 (BP Location: Left Arm, Patient Position: Sitting, Cuff Size: Large)   Pulse 92   Ht 5\' 8"  (1.727 m)   Wt 199 lb 9.6 oz (90.5 kg)   SpO2 96%   BMI 30.35 kg/m   Physical Exam Vitals reviewed.  Constitutional:      General: He is not in acute distress.    Appearance: Normal appearance. He is obese. He is not ill-appearing.  HENT:     Head: Normocephalic and atraumatic.     Right Ear: External ear normal.     Left Ear: External ear normal.     Nose: Nose normal. No congestion or rhinorrhea.     Mouth/Throat:     Mouth: Mucous membranes are moist.     Pharynx: Oropharynx is clear.  Eyes:     General: No scleral icterus.    Extraocular Movements: Extraocular movements intact.     Conjunctiva/sclera: Conjunctivae normal.     Pupils: Pupils are equal, round, and reactive to light.  Cardiovascular:     Rate and Rhythm: Normal rate and regular rhythm.      Pulses: Normal pulses.  Heart sounds: Normal heart sounds. No murmur heard. Pulmonary:     Effort: Pulmonary effort is normal.     Breath sounds: Normal breath sounds. No wheezing, rhonchi or rales.  Abdominal:     General: Abdomen is flat. Bowel sounds are normal. There is no distension.     Palpations: Abdomen is soft.     Tenderness: There is no abdominal tenderness.  Musculoskeletal:        General: No swelling or deformity. Normal range of motion.     Cervical back: Normal range of motion.  Skin:    General: Skin is warm and dry.     Capillary Refill: Capillary refill takes less than 2 seconds.  Neurological:     General: No focal deficit present.     Mental Status: He is alert and oriented to person, place, and time.     Motor: No weakness.  Psychiatric:        Mood and Affect: Mood normal.        Behavior: Behavior normal.        Thought Content: Thought content normal.   Last CBC Lab Results  Component Value Date   WBC 5.8 12/07/2023   HGB 11.1 (L) 12/07/2023   HCT 32.9 (L) 12/07/2023   MCV 89.9 12/07/2023   MCH 30.3 12/07/2023   RDW 12.9 12/07/2023   PLT 140 (L) 12/07/2023   Last metabolic panel Lab Results  Component Value Date   GLUCOSE 103 (H) 12/07/2023   NA 138 12/07/2023   K 4.0 12/07/2023   CL 105 12/07/2023   CO2 23 12/07/2023   BUN 17 12/07/2023   CREATININE 1.45 (H) 12/07/2023   GFRNONAA 51 (L) 12/07/2023   CALCIUM 8.7 (L) 12/07/2023   PHOS 3.1 12/07/2023   PROT 6.2 (L) 12/06/2023   ALBUMIN 3.4 (L) 12/07/2023   LABGLOB 2.9 12/07/2016   BILITOT 0.7 12/06/2023   ALKPHOS 44 12/06/2023   AST 160 (H) 12/06/2023   ALT 44 12/06/2023   ANIONGAP 10 12/07/2023   Last lipids Lab Results  Component Value Date   CHOL 158 12/05/2023   HDL 38 (L) 12/05/2023   LDLCALC 108 (H) 12/05/2023   TRIG 58 12/05/2023   CHOLHDL 4.2 12/05/2023   Last hemoglobin A1c Lab Results  Component Value Date   HGBA1C 5.4 12/05/2023   Last vitamin B12 and  Folate Lab Results  Component Value Date   VITAMINB12 720 12/07/2016   Assessment & Plan:   Problem List Items Addressed This Visit       Essential (primary) hypertension (Chronic)   Adequately controlled with Toprol-XL.  No medication changes are indicated today.      CAD S/P percutaneous coronary angioplasty   Denies chest pain currently.  As described above, recent admission for NSTEMI.  LHC revealed 70% lesion in proximal-mid LAD that was stented and 90% D1 lesion that was stented.  Discharged on DAPT (ASA/Effient) and Toprol-XL.  Seen by cardiology for follow-up earlier this week (2/18).  He will remain on DAPT x 12 months.  Documented history of statin intolerance.  He has been referred to the advanced lipid clinic to discuss PCSK9 therapy.  No appointment currently scheduled.  Will prescribe Repatha today in the setting of known cardiovascular disease and a documented history of statin intolerance.  He will start cardiac rehab next week.      Aortic root dilation (HCC)   40 mm on recent imaging.  Repeat imaging recommended for February 2026.  OSA (obstructive sleep apnea)   S/p inspire implantation.      Influenza A   Recent hospital admission complicated by influenza A.  Treated with Tamiflu.  He endorses symptomatic improvement today.      Stage 3 chronic kidney disease (HCC) (Chronic)   Followed by nephrology (Dr. Wolfgang Phoenix).  Renal function stable on labs from earlier this month.      Restless leg syndrome   Mr. Victory describes having a "severe case" of RLS.  He has previously been evaluated by neurology at [facility].  States that he has tried numerous medications with minimal effect.  Currently he is prescribed gabapentin 300 mg 3 times daily and pramipexole 0.5 mg nightly.  He states that he smokes marijuana if symptoms are severe.  He is interested in seeing a different neurologist for a second opinion as he states that he was essentially told there were no additional  treatment options at his last appointment. -Neurology referral placed today at patient's request      Insomnia   Currently prescribed Ambien CR 12.5 mg nightly as needed.  PDMP reviewed and is appropriate.  UDS and controlled substance agreement are pending.      Chronic musculoskeletal pain   He endorses chronic musculoskeletal pain with multijoint osteoarthritis.  He is prescribed Cymbalta 60 mg daily as well as tramadol 50 mg for as needed pain relief.  PDMP reviewed.  Tramadol last filled earlier this week (2/19).  He states that he takes 1-2 tablets/week.  His prescription was previously filled in October 2024.  UDS and controlled substance agreement are pending.  We discussed that future prescriptions will be written as 1 tablet daily as needed for severe pain relief x 30 tablets.      Hyperlipidemia - Primary   Lipid panel updated earlier this month in the setting of hospital admission for NSTEMI.  Total cholesterol 158 and LDL 108.  Documented history of statin intolerance.  As otherwise documented, Repatha has been prescribed today.      Return in about 3 months (around 03/15/2024).   Billie Lade, MD

## 2023-12-18 DIAGNOSIS — Z955 Presence of coronary angioplasty implant and graft: Secondary | ICD-10-CM | POA: Insufficient documentation

## 2023-12-18 DIAGNOSIS — G8929 Other chronic pain: Secondary | ICD-10-CM | POA: Insufficient documentation

## 2023-12-18 DIAGNOSIS — I2510 Atherosclerotic heart disease of native coronary artery without angina pectoris: Secondary | ICD-10-CM | POA: Insufficient documentation

## 2023-12-18 DIAGNOSIS — E785 Hyperlipidemia, unspecified: Secondary | ICD-10-CM | POA: Insufficient documentation

## 2023-12-18 DIAGNOSIS — I77810 Thoracic aortic ectasia: Secondary | ICD-10-CM | POA: Insufficient documentation

## 2023-12-18 NOTE — Assessment & Plan Note (Signed)
 [name] describes having a "severe case" of RLS.  He has previously been evaluated by neurology at [facility].  States that he has tried numerous medications with minimal effect.  Currently he is prescribed gabapentin 300 mg 3 times daily and pramipexole 0.5 mg nightly.  He states that he smokes marijuana if symptoms are severe.  He is interested in seeing a different neurologist for a second opinion as he states that he was essentially told there were no additional treatment options at his last appointment. -Neurology referral placed today at patient's request

## 2023-12-18 NOTE — Assessment & Plan Note (Signed)
 Adequately controlled with Toprol-XL.  No medication changes are indicated today.

## 2023-12-18 NOTE — Assessment & Plan Note (Signed)
 S/p inspire implantation.

## 2023-12-18 NOTE — Assessment & Plan Note (Signed)
 Followed by nephrology (Dr. Wolfgang Phoenix).  Renal function stable on labs from earlier this month.

## 2023-12-18 NOTE — Assessment & Plan Note (Signed)
 He endorses chronic musculoskeletal pain with multijoint osteoarthritis.  He is prescribed Cymbalta 60 mg daily as well as tramadol 50 mg for as needed pain relief.  PDMP reviewed.  Tramadol last filled earlier this week (2/19).  He states that he takes 1-2 tablets/week.  His prescription was previously filled in October 2024.  UDS and controlled substance agreement are pending.  We discussed that future prescriptions will be written as 1 tablet daily as needed for severe pain relief x 30 tablets.

## 2023-12-18 NOTE — Assessment & Plan Note (Signed)
 Lipid panel updated earlier this month in the setting of hospital admission for NSTEMI.  Total cholesterol 158 and LDL 108.  Documented history of statin intolerance.  As otherwise documented, Repatha has been prescribed today.

## 2023-12-18 NOTE — Assessment & Plan Note (Signed)
 40 mm on recent imaging.  Repeat imaging recommended for February 2026.

## 2023-12-18 NOTE — Assessment & Plan Note (Addendum)
 Denies chest pain currently.  As described above, recent admission for NSTEMI.  LHC revealed 70% lesion in proximal-mid LAD that was stented and 90% D1 lesion that was stented.  Discharged on DAPT (ASA/Effient) and Toprol-XL.  Seen by cardiology for follow-up earlier this week (2/18).  He will remain on DAPT x 12 months.  Documented history of statin intolerance.  He has been referred to the advanced lipid clinic to discuss PCSK9 therapy.  No appointment currently scheduled.  Will prescribe Repatha today in the setting of known cardiovascular disease and a documented history of statin intolerance.  He will start cardiac rehab next week.

## 2023-12-18 NOTE — Assessment & Plan Note (Signed)
 Currently prescribed Ambien CR 12.5 mg nightly as needed.  PDMP reviewed and is appropriate.  UDS and controlled substance agreement are pending.

## 2023-12-18 NOTE — Assessment & Plan Note (Signed)
 Recent hospital admission complicated by influenza A.  Treated with Tamiflu.  He endorses symptomatic improvement today.

## 2023-12-20 ENCOUNTER — Encounter (HOSPITAL_COMMUNITY)
Admission: RE | Admit: 2023-12-20 | Discharge: 2023-12-20 | Disposition: A | Discharge status: Home / Self Care | Payer: Medicare Other | Source: Ambulatory Visit | Attending: Cardiology | Admitting: Cardiology

## 2023-12-20 ENCOUNTER — Ambulatory Visit (INDEPENDENT_AMBULATORY_CARE_PROVIDER_SITE_OTHER): Payer: Medicare Other | Admitting: Pulmonary Disease

## 2023-12-20 ENCOUNTER — Encounter: Payer: Self-pay | Admitting: Pulmonary Disease

## 2023-12-20 VITALS — BP 142/86 | HR 66 | Temp 97.8°F | Ht 68.0 in | Wt 202.6 lb

## 2023-12-20 DIAGNOSIS — G4733 Obstructive sleep apnea (adult) (pediatric): Secondary | ICD-10-CM | POA: Diagnosis not present

## 2023-12-20 DIAGNOSIS — Z955 Presence of coronary angioplasty implant and graft: Secondary | ICD-10-CM | POA: Insufficient documentation

## 2023-12-20 DIAGNOSIS — I214 Non-ST elevation (NSTEMI) myocardial infarction: Secondary | ICD-10-CM | POA: Insufficient documentation

## 2023-12-20 NOTE — Progress Notes (Signed)
 [name]    [id number]    02/01/1951  Primary Care Physician:Dixon, Lucina Mellow, MD  Referring Physician: Benita Stabile, MD [address] [place],  [place] [address]  Chief complaint:   Patient with obstructive sleep apnea He does have an inspire device in place  HPI:  Inspire device optimized during last visit  Since his last visit to the office Had an myocardial infarction about 2 weeks ago Had a cardiac catheterization for which he had 2 stents placed  Did suffer from the flu-continues to improve  History of restless legs on multiple medications -Still having issues with significant restless legs despite gabapentin, Cymbalta, Requip  Last follow-up was with Tammy Parrett -Titrated to voltage of 0.8/level 3, C mode  He is feeling generally well Feels his sleep quality is better following having the inspire device placed  Wakes up feeling like he has had a good nights rest No significant fatigue during the day  Functioning relatively well  His health has been relatively stable  Inspire instrumentation September 09, 2022 by Dr. Jenne Pane Also does struggle with restless leg syndrome  Split-night study performed April 06, 2022 showing severe obstructive sleep apnea with AHI of 39, SpO2 low of 85 with optimal control of 7 to 10 cm of water  Drug-induced endoscopy was performed July 21, 2022 Implantation September 09, 2022 Device activation was November 26, 2022  Compliance from his device shows excellent compliance with device use  Incoming voltage was 0.8 Normal tongue movement Treatment duration of 8 hours Therapy delay of 30 minutes  Average daily use of 5 hours   Outpatient Encounter Medications as of 12/20/2023  Medication Sig   aspirin EC 81 MG tablet Take 1 tablet (81 mg total) by mouth daily. Swallow whole.   DULoxetine (CYMBALTA) 30 MG capsule Take 30 mg by mouth 2 (two) times daily.   Evolocumab (REPATHA SURECLICK) 140 MG/ML SOAJ  Inject 140 mg into the skin every 14 (fourteen) days.   gabapentin (NEURONTIN) 300 MG capsule Take 300 mg by mouth See admin instructions. One tablet in the morning and afternoon and two tablets at bedtime   metoprolol succinate (TOPROL-XL) 25 MG 24 hr tablet Take 0.5 tablets (12.5 mg total) by mouth daily.   nitroGLYCERIN (NITROSTAT) 0.4 MG SL tablet Place 1 tablet (0.4 mg total) under the tongue every 5 (five) minutes as needed for chest pain.   pramipexole (MIRAPEX) 0.5 MG tablet TAKE 1 TABLET AT DINNER AND 1 TABLET AT BEDTIME.   prasugrel (EFFIENT) 10 MG TABS tablet Take 1 tablet (10 mg total) by mouth daily.   zolpidem (AMBIEN CR) 12.5 MG CR tablet Take 1 tablet by mouth at bedtime.   No facility-administered encounter medications on file as of 12/20/2023.    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   Sleep apnea    uses Cpap nightly    Past Surgical History:  Procedure Laterality Date   BACK SURGERY  lumbar 2020   CATARACT EXTRACTION     10-15 years ago   COLONOSCOPY  05/25/2012   Procedure: COLONOSCOPY;  Surgeon: Malissa Hippo, MD;  Location: [facility];  Service: Endoscopy;  Laterality: N/A;  1030   COLONOSCOPY N/A 05/14/2017   Procedure: COLONOSCOPY;  Surgeon: Malissa Hippo, MD;  Location: [facility];  Service: Endoscopy;  Laterality: N/A;  10:10   CORONARY PRESSURE/FFR WITH 3D MAPPING N/A 12/06/2023   Procedure: Coronary Pressure/FFR w/3D Mapping;  Surgeon: Marykay Lex, MD;  Location: [facility] INVASIVE CV LAB;  Service: Cardiovascular;   Laterality: N/A;   CORONARY STENT INTERVENTION N/A 12/06/2023   Procedure: CORONARY STENT INTERVENTION;  Surgeon: Marykay Lex, MD;  Location: [facility] INVASIVE CV LAB;  Service: Cardiovascular;  Laterality: N/A;   CYSTOSCOPY/RETROGRADE/URETEROSCOPY Right 06/07/2014   Procedure: CYSTOSCOPY, RIGHT RETROGRADE, RIGHT URETEROSCOPY, RIGHT URETERAL BALLOON DILATATION, BIOPSY, RIGHT URETERAL STENT;  Surgeon: Chelsea Aus, MD;  Location: [facility];  Service: Urology;  Laterality: Right;   DRUG INDUCED ENDOSCOPY Right 07/21/2022   Procedure: DRUG INDUCED ENDOSCOPY;  Surgeon: Christia Reading, MD;  Location: [facility];  Service: ENT;  Laterality: Right;   I & D EXTREMITY Left 05/01/2021   Procedure: LEFT INDEX AND LONG FINGER AMPUTATION AND RING FINGER NAIL BED REPAIR.;  Surgeon: Betha Loa, MD;  Location: [facility];  Service: Orthopedics;  Laterality: Left;  LEFT INDEX AND LONG FINGER AMPUTATION AND RING FINGER NAIL BED REPAIR.   IMPLANTATION OF HYPOGLOSSAL NERVE STIMULATOR Right 09/09/2022   Procedure: IMPLANTATION OF HYPOGLOSSAL NERVE STIMULATOR;  Surgeon: Christia Reading, MD;  Location: [facility];  Service: ENT;  Laterality: Right;   kidney stone  1983   Had partial right nephrectomy   KNEE ARTHROSCOPY WITH MEDIAL MENISECTOMY Right 04/14/2018   Procedure: KNEE ARTHROSCOPY WITH MEDIAL MENISECTOMY;  Surgeon: Vickki Hearing, MD;  Location: [facility];  Service: Orthopedics;  Laterality: Right;   left foot surgery  2010   repair of joint   LEFT HEART CATH AND CORONARY ANGIOGRAPHY N/A 12/06/2023   Procedure: LEFT HEART CATH AND CORONARY ANGIOGRAPHY;  Surgeon: Marykay Lex, MD;  Location: [facility] INVASIVE CV LAB;  Service: Cardiovascular;  Laterality: N/A;   PARTIAL KNEE ARTHROPLASTY Left 09/16/2021   Procedure: UNICOMPARTMENTAL KNEE MEDIALLY;  Surgeon: Durene Romans, MD;  Location: [facility];  Service: Orthopedics;  Laterality: Left;   WRIST SURGERY Bilateral      Family History  Problem Relation Age of Onset   Cancer Father        Bladder cancer   Lung cancer Brother    Healthy Mother    Healthy Brother    Healthy Brother     Social History   Socioeconomic History   Marital status: Married    Spouse name: Rylei Codispoti   Number of children: Not on file   Years of education: 18   Highest education level: Master's degree (e.g., MA, MS, MEng, MEd, MSW, MBA)  Occupational History   Occupation: Retired    Comment: Environmental manager for [employer]  Tobacco Use   Smoking status: Former    Types: Gaffer exposure: Never   Smokeless tobacco: Never  Vaping Use   Vaping status: Never Used  Substance and Sexual Activity   Alcohol use: Yes    Comment: rare   Drug use: No   Sexual activity: Yes    Birth control/protection: None  Other Topics Concern   Not on file  Social History Narrative   He is married.  Has no children.  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     December 2020: He said he used to walk about 2 to 3 miles a day with his wife, but now she keeps going and he has to stop because of dyspnea.   Social Drivers of Corporate investment banker Strain: Low Risk  (05/25/2022)   Overall Financial Resource Strain (CARDIA)    Difficulty of Paying Living Expenses: Not hard at all  Food Insecurity: No Food Insecurity (12/10/2023)   Hunger Vital Sign    Worried About Running Out of Food in the Last Year: Never true    Ran Out of Food in the Last Year: Never true  Transportation Needs: No Transportation Needs (12/10/2023)   PRAPARE - Administrator, Civil Service (Medical): No    Lack of Transportation (Non-Medical): No  Physical Activity: Sufficiently Active (05/25/2022)   Exercise Vital Sign    Days of Exercise per Week: 5 days    Minutes of Exercise per Session: 30 min  Stress: No Stress Concern Present (05/25/2022)   [employer] of Occupational Health - Occupational Stress Questionnaire    Feeling of  Stress : Only a little  Social Connections: Moderately Isolated (12/04/2023)   Social Connection and Isolation Panel [NHANES]    Frequency of Communication with Friends and Family: More than three times a week    Frequency of Social Gatherings with Friends and Family: Once a week    Attends Religious Services: Never    Database administrator or Organizations: No    Attends Banker Meetings: Never    Marital Status: Married  Catering manager Violence: Not At Risk (12/10/2023)   Humiliation, Afraid, Rape, and Kick questionnaire    Fear of Current or Ex-Partner: No    Emotionally Abused: No    Physically Abused: No    Sexually Abused: No    Review of Systems  Respiratory:  Positive for apnea.   Psychiatric/Behavioral:  Positive for sleep disturbance.     Vitals:   12/20/23 1010  BP: (!) 142/86  Pulse: 66  Temp: 97.8 F (36.6 C)  SpO2: 99%     Physical Exam Constitutional:      Appearance: He is obese.  HENT:     Head: Normocephalic.     Right Ear: Tympanic membrane normal.     Mouth/Throat:     Mouth: Mucous membranes are moist.  Eyes:     General: No scleral icterus.    Pupils: Pupils are equal, round, and reactive to light.  Cardiovascular:     Rate and Rhythm: Normal rate and regular rhythm.     Heart sounds: No murmur heard.    No friction rub.  Pulmonary:     Effort: No respiratory distress.     Breath sounds: No stridor. No wheezing or rhonchi.  Musculoskeletal:     Cervical back: No rigidity or tenderness.  Neurological:     Mental Status: He is alert.  Psychiatric:        Mood and Affect: Mood normal.   Data Reviewed: Compliance data reviewed Compliant with treatment-93%, average use of 5 hours 35 minutes Amplitude 0.8, start delay of 30, pause time of 20, therapy duration of 8 rate of 33 pulse with of 90  Sleep study report 08/19/2023-WatchPAT study AHI of 15.2  His device was interrogated in the office today Stimulation settings were  changed during the  visit today after interrogation Currently on C mode -Therapy delay of 30 minutes -Voltage of 0.8 -Therapy duration of 8 hours  Assessment:  He appears to be doing well with his inspire device -Continues to benefit from use Waking up feeling refreshed  Restless leg syndrome continues to be a concern for emptying. -On Mirapex, Neurontin, Cymbalta    Plan/Recommendations:  No changes made to his inspire device during this visit  Continue line of treatment  Continue management of restless legs  Follow-up in about 3 months  He will have a WatchPAT sleep study performed prior to next visit   Virl Diamond MD [facility] 12/20/2023, 10:17 AM  CC: Benita Stabile, MD

## 2023-12-20 NOTE — Progress Notes (Signed)
 Virtual orientation visit for cardiac rehab with NSTEMI/Stent placement completed. On-site orientation visit scheduled for 12/21/23 at 8:30. Paperwork sent through [employer]. Patient says he will completed prior to arrival.

## 2023-12-20 NOTE — Patient Instructions (Signed)
 We did not make any changes to her device today  Continue using the device nightly  You can go ahead and repeat the sleep study  We will contact you as soon as we look at the sleep study result  Follow-up in about 3 months  Call us with significant concerns  Continue using the medications for your restless legs

## 2023-12-20 NOTE — Progress Notes (Deleted)
 Virtual orientation visit for cardiac rehab with NSTEMI/Stent placement completed. On-site orientation visit scheduled for 12/21/23 at 8:30. Paperwork sent through [employer]. Patient says he will completed prior to arrival.

## 2023-12-21 ENCOUNTER — Encounter (HOSPITAL_COMMUNITY)
Admission: RE | Admit: 2023-12-21 | Discharge: 2023-12-21 | Disposition: A | Discharge status: Home / Self Care | Payer: Medicare Other | Source: Ambulatory Visit | Attending: Cardiology

## 2023-12-21 VITALS — Ht 69.0 in | Wt 203.3 lb

## 2023-12-21 DIAGNOSIS — I214 Non-ST elevation (NSTEMI) myocardial infarction: Secondary | ICD-10-CM

## 2023-12-21 DIAGNOSIS — Z955 Presence of coronary angioplasty implant and graft: Secondary | ICD-10-CM

## 2023-12-21 NOTE — Progress Notes (Signed)
 Cardiac Individual Treatment Plan  Patient Details  Name: [name] MRN: [id number] Date of Birth: [birth year] Referring Provider:   Flowsheet Row CARDIAC REHAB PHASE II ORIENTATION from 12/21/2023 in [facility] CARDIAC REHABILITATION  Referring Provider Candelaria Stagers MD  [facility] Cardiologist: Dr. Onalee Hua Harding]       Initial Encounter Date:  Flowsheet Row CARDIAC REHAB PHASE II ORIENTATION from 12/21/2023 in [place] CARDIAC REHABILITATION  Date 12/21/23       Visit Diagnosis: NSTEMI (non-ST elevated myocardial infarction) [facility])  Status post coronary artery stent placement  Patient's Home Medications on Admission:  Current Outpatient Medications:    aspirin EC 81 MG tablet, Take 1 tablet (81 mg total) by mouth daily. Swallow whole., Disp: , Rfl:    DULoxetine (CYMBALTA) 30 MG capsule, Take 30 mg by mouth 2 (two) times daily., Disp: , Rfl:    Evolocumab (REPATHA SURECLICK) 140 MG/ML SOAJ, Inject 140 mg into the skin every 14 (fourteen) days., Disp: 2 mL, Rfl: 2   gabapentin (NEURONTIN) 300 MG capsule, Take 300 mg by mouth See admin instructions. One tablet in the morning and afternoon and two tablets at bedtime, Disp: , Rfl:    metoprolol succinate (TOPROL-XL) 25 MG 24 hr tablet, Take 0.5 tablets (12.5 mg total) by mouth daily., Disp: 45 tablet, Rfl: 0   nitroGLYCERIN (NITROSTAT) 0.4 MG SL tablet, Place 1 tablet (0.4 mg total) under the tongue every 5 (five) minutes as needed for chest pain., Disp: 25 tablet, Rfl: 11   pramipexole (MIRAPEX) 0.5 MG tablet, TAKE 1 TABLET AT DINNER AND 1 TABLET AT BEDTIME., Disp: 180 tablet, Rfl: 0   prasugrel (EFFIENT) 10 MG TABS tablet, Take 1 tablet (10 mg total) by mouth daily., Disp: 180 tablet, Rfl: 0   zolpidem (AMBIEN CR) 12.5 MG CR tablet, Take 1 tablet by mouth at bedtime., Disp: , Rfl:   Past Medical History: Past Medical History:  Diagnosis Date   Anemia    Anxiety    Arthritis    spinal stenosis   Depression    History of  kidney stones    Hyperlipidemia    Hypertension    Kidney function abnormal 1983   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   Sleep apnea    uses Cpap nightly    Tobacco Use: Social History   Tobacco Use  Smoking Status Former   Types: Cigars   Passive exposure: Never  Smokeless Tobacco Never    Labs: Review Flowsheet       Latest Ref Rng & Units 05/14/2017 12/05/2023  Labs for ITP Cardiac and Pulmonary Rehab  Cholestrol 0 - 200 mg/dL 469  629   LDL (calc) 0 - 99 mg/dL 528  413   HDL-C >24 mg/dL 29  38   Trlycerides <401 mg/dL 027  58   Hemoglobin O5D 4.8 - 5.6 % - 5.4     Capillary Blood Glucose: No results found for: "GLUCAP"   Exercise Target Goals: Exercise Program Goal: Individual exercise prescription set using results from initial 6 min walk test and THRR while considering  patient's activity barriers and safety.   Exercise Prescription Goal: Starting with aerobic activity 30 plus minutes a day, 3 days per week for initial exercise prescription. Provide home exercise prescription and guidelines that participant acknowledges understanding prior to discharge.  Activity Barriers & Risk Stratification:  Activity Barriers & Cardiac Risk Stratification - 12/21/23 0913       Activity  Barriers & Cardiac Risk Stratification   Activity Barriers Left Knee Replacement;Deconditioning;Shortness of Breath;Balance Concerns;Arthritis;Neck/Spine Problems;Joint Problems;Muscular Weakness   hx spinal stenosis, arthritis all over, bilateral knee surgeries (occassional pain)   Cardiac Risk Stratification Moderate             6 Minute Walk:  6 Minute Walk     Row Name 12/21/23 0913         6 Minute Walk   Phase Initial     Distance 1325 feet     Walk Time 6 minutes     # of Rest Breaks 0     MPH 2.51     METS 2.76     RPE 7     VO2 Peak 9.67     Symptoms No     Resting HR 78 bpm     Resting BP 104/62     Resting  Oxygen Saturation  97 %     Exercise Oxygen Saturation  during 6 min walk 98 %     Max Ex. HR 103 bpm     Max Ex. BP 148/66     2 Minute Post BP 114/62              Oxygen Initial Assessment:   Oxygen Re-Evaluation:   Oxygen Discharge (Final Oxygen Re-Evaluation):   Initial Exercise Prescription:  Initial Exercise Prescription - 12/21/23 0900       Date of Initial Exercise RX and Referring Provider   Date 12/21/23    Referring Provider Candelaria Stagers MD   Primary Cardiologist: Dr. Bryan Lemma     Oxygen   Maintain Oxygen Saturation 88% or higher      Treadmill   MPH 2.2    Grade 0.5    Minutes 15    METs 2.84      NuStep   Level 3    SPM 80    Minutes 15    METs 2.5      Prescription Details   Frequency (times per week) 2    Duration Progress to 30 minutes of continuous aerobic without signs/symptoms of physical distress      Intensity   THRR 40-80% of Max Heartrate 105-132    Ratings of Perceived Exertion 11-13    Perceived Dyspnea 0-4      Progression   Progression Continue to progress workloads to maintain intensity without signs/symptoms of physical distress.      Resistance Training   Training Prescription Yes    Weight 5 lb    Reps 10-15             Perform Capillary Blood Glucose checks as needed.  Exercise Prescription Changes:   Exercise Prescription Changes     Row Name 12/21/23 0900             Response to Exercise   Blood Pressure (Admit) 104/62       Blood Pressure (Exercise) 148/66       Blood Pressure (Exit) 114/62       Heart Rate (Admit) 78 bpm       Heart Rate (Exercise) 103 bpm       Heart Rate (Exit) 80 bpm       Oxygen Saturation (Admit) 97 %       Oxygen Saturation (Exercise) 98 %       Rating of Perceived Exertion (Exercise) 7       Symptoms none       Comments walk test results  Exercise Comments:   Exercise Goals and Review:   Exercise Goals     Row Name 12/21/23 0917              Exercise Goals   Increase Physical Activity Yes       Intervention Provide advice, education, support and counseling about physical activity/exercise needs.;Develop an individualized exercise prescription for aerobic and resistive training based on initial evaluation findings, risk stratification, comorbidities and participant's personal goals.       Expected Outcomes Long Term: Add in home exercise to make exercise part of routine and to increase amount of physical activity.;Long Term: Exercising regularly at least 3-5 days a week.;Short Term: Attend rehab on a regular basis to increase amount of physical activity.       Increase Strength and Stamina Yes       Intervention Provide advice, education, support and counseling about physical activity/exercise needs.;Develop an individualized exercise prescription for aerobic and resistive training based on initial evaluation findings, risk stratification, comorbidities and participant's personal goals.       Expected Outcomes Short Term: Increase workloads from initial exercise prescription for resistance, speed, and METs.;Short Term: Perform resistance training exercises routinely during rehab and add in resistance training at home;Long Term: Improve cardiorespiratory fitness, muscular endurance and strength as measured by increased METs and functional capacity ( )       Able to understand and use rate of perceived exertion (RPE) scale Yes       Intervention Provide education and explanation on how to use RPE scale       Expected Outcomes Short Term: Able to use RPE daily in rehab to express subjective intensity level;Long Term:  Able to use RPE to guide intensity level when exercising independently       Able to understand and use Dyspnea scale Yes       Intervention Provide education and explanation on how to use Dyspnea scale       Expected Outcomes Short Term: Able to use Dyspnea scale daily in rehab to express subjective sense of  shortness of breath during exertion;Long Term: Able to use Dyspnea scale to guide intensity level when exercising independently       Knowledge and understanding of Target Heart Rate Range (THRR) Yes       Intervention Provide education and explanation of THRR including how the numbers were predicted and where they are located for reference       Expected Outcomes Short Term: Able to state/look up THRR;Long Term: Able to use THRR to govern intensity when exercising independently;Short Term: Able to use daily as guideline for intensity in rehab       Able to check pulse independently Yes       Intervention Provide education and demonstration on how to check pulse in carotid and radial arteries.;Review the importance of being able to check your own pulse for safety during independent exercise       Expected Outcomes Short Term: Able to explain why pulse checking is important during independent exercise;Long Term: Able to check pulse independently and accurately       Understanding of Exercise Prescription Yes       Intervention Provide education, explanation, and written materials on patient's individual exercise prescription       Expected Outcomes Short Term: Able to explain program exercise prescription;Long Term: Able to explain home exercise prescription to exercise independently                Exercise  Goals Re-Evaluation :    Discharge Exercise Prescription (Final Exercise Prescription Changes):  Exercise Prescription Changes - 12/21/23 0900       Response to Exercise   Blood Pressure (Admit) 104/62    Blood Pressure (Exercise) 148/66    Blood Pressure (Exit) 114/62    Heart Rate (Admit) 78 bpm    Heart Rate (Exercise) 103 bpm    Heart Rate (Exit) 80 bpm    Oxygen Saturation (Admit) 97 %    Oxygen Saturation (Exercise) 98 %    Rating of Perceived Exertion (Exercise) 7    Symptoms none    Comments walk test results             Nutrition:  Target Goals: Understanding  of nutrition guidelines, daily intake of sodium 1500mg , cholesterol 200mg , calories 30% from fat and 7% or less from saturated fats, daily to have 5 or more servings of fruits and vegetables.  Biometrics:  Pre Biometrics - 12/21/23 0917       Pre Biometrics   Height 5\' 9"  (1.753 m)    Weight 92.2 kg    Waist Circumference 39 inches    Hip Circumference 40 inches    Waist to Hip Ratio 0.98 %    BMI (Calculated) 30.01    Grip Strength 43.2 kg    Single Leg Stand 18.4 seconds              Nutrition Therapy Plan and Nutrition Goals:   Nutrition Assessments:  MEDIFICTS Score Key: >=70 Need to make dietary changes  40-70 Heart Healthy Diet <= 40 Therapeutic Level Cholesterol Diet  Flowsheet Row CARDIAC REHAB PHASE II ORIENTATION from 12/21/2023 in [facility] CARDIAC REHABILITATION  Picture Your Plate Total Score on Admission 68      Picture Your Plate Scores: <11 Unhealthy dietary pattern with much room for improvement. 41-50 Dietary pattern unlikely to meet recommendations for good health and room for improvement. 51-60 More healthful dietary pattern, with some room for improvement.  >60 Healthy dietary pattern, although there may be some specific behaviors that could be improved.    Nutrition Goals Re-Evaluation:   Nutrition Goals Discharge (Final Nutrition Goals Re-Evaluation):   Psychosocial: Target Goals: Acknowledge presence or absence of significant depression and/or stress, maximize coping skills, provide positive support system. Participant is able to verbalize types and ability to use techniques and skills needed for reducing stress and depression.  Initial Review & Psychosocial Screening:  Initial Psych Review & Screening - 12/20/23 1143       Initial Review   Current issues with None Identified      Family Dynamics   Good Support System? Yes      Barriers   Psychosocial barriers to participate in program There are no identifiable barriers or  psychosocial needs.;The patient should benefit from training in stress management and relaxation.      Screening Interventions   Interventions Encouraged to exercise;To provide support and resources with identified psychosocial needs;Provide feedback about the scores to participant    Expected Outcomes Short Term goal: Utilizing psychosocial counselor, staff and physician to assist with identification of specific Stressors or current issues interfering with healing process. Setting desired goal for each stressor or current issue identified.;Long Term Goal: Stressors or current issues are controlled or eliminated.;Short Term goal: Identification and review with participant of any Quality of Life or Depression concerns found by scoring the questionnaire.;Long Term goal: The participant improves quality of Life and PHQ9 Scores as seen by post  scores and/or verbalization of changes             Quality of Life Scores:  Quality of Life - 12/21/23 0918       Quality of Life   Select Quality of Life      Quality of Life Scores   Health/Function Pre 22.9 %    Socioeconomic Pre 28.63 %    Psych/Spiritual Pre 26.63 %    Family Pre 26.5 %    GLOBAL Pre 24.74 %            Scores of 19 and below usually indicate a poorer quality of life in these areas.  A difference of  2-3 points is a clinically meaningful difference.  A difference of 2-3 points in the total score of the Quality of Life Index has been associated with significant improvement in overall quality of life, self-image, physical symptoms, and general health in studies assessing change in quality of life.  PHQ-9: Review Flowsheet       12/21/2023 12/17/2023 05/25/2022  Depression screen PHQ 2/9  Decreased Interest 2 2 0 0  Down, Depressed, Hopeless 0 1 0 0  PHQ - 2 Score 2 3 0 0  Altered sleeping 1 0 -  Tired, decreased energy 2 2 -  Change in appetite 2 1 -  Feeling bad or failure about yourself  0 1 -  Trouble concentrating 1  1 -  Moving slowly or fidgety/restless 1 1 -  Suicidal thoughts 0 0 -  PHQ-9 Score 9 9 -  Difficult doing work/chores Somewhat difficult Somewhat difficult -    Details       Multiple values from one day are sorted in reverse-chronological order        Interpretation of Total Score  Total Score Depression Severity:  1-4 = Minimal depression, 5-9 = Mild depression, 10-14 = Moderate depression, 15-19 = Moderately severe depression, 20-27 = Severe depression   Psychosocial Evaluation and Intervention:  Psychosocial Evaluation - 12/20/23 1143       Psychosocial Evaluation & Interventions   Interventions Stress management education;Relaxation education;Encouraged to exercise with the program and follow exercise prescription    Comments Patient was referred to CR with NSTEMI/Stent placement. He denies any depression, anxiety or stressors in his life. He says he was a little anxious following his MI but this has resolved. He has OSA and has used a CPAP machine up until 1 year ago and had inspire implanted and this is working great for him. He sleeps well taking ambien. He has severe restless leg syndrome and takes cymbalta and gabapentin for this. He is getting over the flu and says he has some residual SOB and coughing. His wife is his main support person. He is retired as the [employer] for 23 years. He enjoys working with wood and makes furniture. He seems very motivated to participate in the program. His main goals are to improve his SOB and get his strength and stamina back and to get healthy overall. He has no barriers to complete the program identified.    Expected Outcomes Short Term: start the program and attend consistently. Long Term: complete the program meeting his personal goals.    Continue Psychosocial Services  Follow up required by staff             Psychosocial Re-Evaluation:   Psychosocial Discharge (Final Psychosocial Re-Evaluation):   Vocational  Rehabilitation: Provide vocational rehab assistance to qualifying candidates.   Vocational Rehab Evaluation &  Intervention:  Vocational Rehab - 12/20/23 1143       Initial Vocational Rehab Evaluation & Intervention   Assessment shows need for Vocational Rehabilitation No      Vocational Rehab Re-Evaulation   Comments Patient is retired.             Education: Education Goals: Education classes will be provided on a weekly basis, covering required topics. Participant will state understanding/return demonstration of topics presented.  Learning Barriers/Preferences:  Learning Barriers/Preferences - 12/21/23 1610       Learning Barriers/Preferences   Learning Barriers Sight   glasses for reading   Learning Preferences Verbal Instruction;Skilled Demonstration             Education Topics: Hypertension, Hypertension Reduction -Define heart disease and high blood pressure. Discus how high blood pressure affects the body and ways to reduce high blood pressure.   Exercise and Your Heart -Discuss why it is important to exercise, the FITT principles of exercise, normal and abnormal responses to exercise, and how to exercise safely.   Angina -Discuss definition of angina, causes of angina, treatment of angina, and how to decrease risk of having angina.   Cardiac Medications -Review what the following cardiac medications are used for, how they affect the body, and side effects that may occur when taking the medications.  Medications include Aspirin, Beta blockers, calcium channel blockers, ACE Inhibitors, angiotensin receptor blockers, diuretics, digoxin, and antihyperlipidemics.   Congestive Heart Failure -Discuss the definition of CHF, how to live with CHF, the signs and symptoms of CHF, and how keep track of weight and sodium intake.   Heart Disease and Intimacy -Discus the effect sexual activity has on the heart, how changes occur during intimacy as we age, and safety  during sexual activity.   Smoking Cessation / COPD -Discuss different methods to quit smoking, the health benefits of quitting smoking, and the definition of COPD.   Nutrition I: Fats -Discuss the types of cholesterol, what cholesterol does to the heart, and how cholesterol levels can be controlled.   Nutrition II: Labels -Discuss the different components of food labels and how to read food label   Heart Parts/Heart Disease and PAD -Discuss the anatomy of the heart, the pathway of blood circulation through the heart, and these are affected by heart disease.   Stress I: Signs and Symptoms -Discuss the causes of stress, how stress may lead to anxiety and depression, and ways to limit stress.   Stress II: Relaxation -Discuss different types of relaxation techniques to limit stress.   Warning Signs of Stroke / TIA -Discuss definition of a stroke, what the signs and symptoms are of a stroke, and how to identify when someone is having stroke.   Knowledge Questionnaire Score:  Knowledge Questionnaire Score - 12/21/23 9604       Knowledge Questionnaire Score   Pre Score 18/26             Core Components/Risk Factors/Patient Goals at Admission:  Personal Goals and Risk Factors at Admission - 12/21/23 0922       Core Components/Risk Factors/Patient Goals on Admission    Weight Management Weight Maintenance;Yes;Obesity;Weight Loss    Intervention Weight Management: Develop a combined nutrition and exercise program designed to reach desired caloric intake, while maintaining appropriate intake of nutrient and fiber, sodium and fats, and appropriate energy expenditure required for the weight goal.;Weight Management: Provide education and appropriate resources to help participant work on and attain dietary goals.;Weight Management/Obesity: Establish reasonable short  term and long term weight goals.;Obesity: Provide education and appropriate resources to help participant work on and  attain dietary goals.    Admit Weight 203 lb 4.8 oz (92.2 kg)    Goal Weight: Short Term 198 lb (89.8 kg)    Goal Weight: Long Term 198 lb (89.8 kg)    Expected Outcomes Short Term: Continue to assess and modify interventions until short term weight is achieved;Long Term: Adherence to nutrition and physical activity/exercise program aimed toward attainment of established weight goal;Weight Maintenance: Understanding of the daily nutrition guidelines, which includes 25-35% calories from fat, 7% or less cal from saturated fats, less than 200mg  cholesterol, less than 1.5gm of sodium, & 5 or more servings of fruits and vegetables daily;Weight Loss: Understanding of general recommendations for a balanced deficit meal plan, which promotes 1-2 lb weight loss per week and includes a negative energy balance of [phone] kcal/d;Understanding recommendations for meals to include 15-35% energy as protein, 25-35% energy from fat, 35-60% energy from carbohydrates, less than 200mg  of dietary cholesterol, 20-35 gm of total fiber daily;Understanding of distribution of calorie intake throughout the day with the consumption of 4-5 meals/snacks    Improve shortness of breath with ADL's Yes    Intervention Provide education, individualized exercise plan and daily activity instruction to help decrease symptoms of SOB with activities of daily living.    Expected Outcomes Short Term: Improve cardiorespiratory fitness to achieve a reduction of symptoms when performing ADLs;Long Term: Be able to perform more ADLs without symptoms or delay the onset of symptoms    Hypertension Yes    Intervention Provide education on lifestyle modifcations including regular physical activity/exercise, weight management, moderate sodium restriction and increased consumption of fresh fruit, vegetables, and low fat dairy, alcohol moderation, and smoking cessation.;Monitor prescription use compliance.    Expected Outcomes Short Term: Continued assessment  and intervention until BP is < 140/53mm HG in hypertensive participants. < 130/45mm HG in hypertensive participants with diabetes, heart failure or chronic kidney disease.;Long Term: Maintenance of blood pressure at goal levels.    Lipids Yes    Intervention Provide education and support for participant on nutrition & aerobic/resistive exercise along with prescribed medications to achieve LDL 70mg , HDL >40mg .    Expected Outcomes Short Term: Participant states understanding of desired cholesterol values and is compliant with medications prescribed. Participant is following exercise prescription and nutrition guidelines.;Long Term: Cholesterol controlled with medications as prescribed, with individualized exercise RX and with personalized nutrition plan. Value goals: LDL < 70mg , HDL > 40 mg.             Core Components/Risk Factors/Patient Goals Review:    Core Components/Risk Factors/Patient Goals at Discharge (Final Review):    ITP Comments:  ITP Comments     Row Name 12/20/23 1153 12/21/23 0912         ITP Comments Virtual orientation visit for cardiac rehab with NSTEMI/Stent placement completed. On-site orientation visit scheduled for 12/21/23 at 8:30. Paperwork sent through [employer]. Patient says he will completed prior to arrival. Patient attend orientation today.  Patient is attending Cardiac Rehabilitation Program.  Documentation for diagnosis [facility] be found in [facility] 12/03/23.  Reviewed medical chart, RPE/RPD, gym safety, and program guidelines.  Patient was fitted to equipment they will be using during rehab.  Patient is scheduled to start exercise on Thursday 12/23/23 at 1030.   Initial ITP created and sent for review and signature by Dr. Dina Rich, Medical Director for Cardiac Rehabilitation Program.  Comments: Initial ITP

## 2023-12-21 NOTE — Patient Instructions (Signed)
 Patient Instructions  Patient Details  Name: [name] MRN: [id number] Date of Birth: [birth year] Referring Provider:  [name], MD  Below are your personal goals for exercise, nutrition, and risk factors. Our goal is to help you stay on track towards obtaining and maintaining these goals. We will be discussing your progress on these goals with you throughout the program.  Initial Exercise Prescription:  Initial Exercise Prescription - 12/21/23 0900       Date of Initial Exercise RX and Referring Provider   Date 12/21/23    Referring Provider Candelaria Stagers MD   Primary Cardiologist: Dr. Bryan Lemma     Oxygen   Maintain Oxygen Saturation 88% or higher      Treadmill   MPH 2.2    Grade 0.5    Minutes 15    METs 2.84      NuStep   Level 3    SPM 80    Minutes 15    METs 2.5      Prescription Details   Frequency (times per week) 2    Duration Progress to 30 minutes of continuous aerobic without signs/symptoms of physical distress      Intensity   THRR 40-80% of Max Heartrate 105-132    Ratings of Perceived Exertion 11-13    Perceived Dyspnea 0-4      Progression   Progression Continue to progress workloads to maintain intensity without signs/symptoms of physical distress.      Resistance Training   Training Prescription Yes    Weight 5 lb    Reps 10-15             Exercise Goals: Frequency: Be able to perform aerobic exercise two to three times per week in program working toward 2-5 days per week of home exercise.  Intensity: Work with a perceived exertion of 11 (fairly light) - 15 (hard) while following your exercise prescription.  We will make changes to your prescription with you as you progress through the program.   Duration: Be able to do 30 to 45 minutes of continuous aerobic exercise in addition to a 5 minute warm-up and a 5 minute cool-down routine.   Nutrition Goals: Your personal nutrition goals will be established when you do your  nutrition analysis with the dietician.  The following are general nutrition guidelines to follow: Cholesterol < 200mg /day Sodium < 1500mg /day Fiber: Men over 50 yrs - 30 grams per day  Personal Goals:  Personal Goals and Risk Factors at Admission - 12/21/23 0922       Core Components/Risk Factors/Patient Goals on Admission    Weight Management Weight Maintenance;Yes;Obesity;Weight Loss    Intervention Weight Management: Develop a combined nutrition and exercise program designed to reach desired caloric intake, while maintaining appropriate intake of nutrient and fiber, sodium and fats, and appropriate energy expenditure required for the weight goal.;Weight Management: Provide education and appropriate resources to help participant work on and attain dietary goals.;Weight Management/Obesity: Establish reasonable short term and long term weight goals.;Obesity: Provide education and appropriate resources to help participant work on and attain dietary goals.    Admit Weight 203 lb 4.8 oz (92.2 kg)    Goal Weight: Short Term 198 lb (89.8 kg)    Goal Weight: Long Term 198 lb (89.8 kg)    Expected Outcomes Short Term: Continue to assess and modify interventions until short term weight is achieved;Long Term: Adherence to nutrition and physical activity/exercise program aimed toward attainment of established weight goal;Weight Maintenance:  Understanding of the daily nutrition guidelines, which includes 25-35% calories from fat, 7% or less cal from saturated fats, less than 200mg  cholesterol, less than 1.5gm of sodium, & 5 or more servings of fruits and vegetables daily;Weight Loss: Understanding of general recommendations for a balanced deficit meal plan, which promotes 1-2 lb weight loss per week and includes a negative energy balance of [phone] kcal/d;Understanding recommendations for meals to include 15-35% energy as protein, 25-35% energy from fat, 35-60% energy from carbohydrates, less than 200mg  of  dietary cholesterol, 20-35 gm of total fiber daily;Understanding of distribution of calorie intake throughout the day with the consumption of 4-5 meals/snacks    Improve shortness of breath with ADL's Yes    Intervention Provide education, individualized exercise plan and daily activity instruction to help decrease symptoms of SOB with activities of daily living.    Expected Outcomes Short Term: Improve cardiorespiratory fitness to achieve a reduction of symptoms when performing ADLs;Long Term: Be able to perform more ADLs without symptoms or delay the onset of symptoms    Hypertension Yes    Intervention Provide education on lifestyle modifcations including regular physical activity/exercise, weight management, moderate sodium restriction and increased consumption of fresh fruit, vegetables, and low fat dairy, alcohol moderation, and smoking cessation.;Monitor prescription use compliance.    Expected Outcomes Short Term: Continued assessment and intervention until BP is < 140/65mm HG in hypertensive participants. < 130/43mm HG in hypertensive participants with diabetes, heart failure or chronic kidney disease.;Long Term: Maintenance of blood pressure at goal levels.    Lipids Yes    Intervention Provide education and support for participant on nutrition & aerobic/resistive exercise along with prescribed medications to achieve LDL 70mg , HDL >40mg .    Expected Outcomes Short Term: Participant states understanding of desired cholesterol values and is compliant with medications prescribed. Participant is following exercise prescription and nutrition guidelines.;Long Term: Cholesterol controlled with medications as prescribed, with individualized exercise RX and with personalized nutrition plan. Value goals: LDL < 70mg , HDL > 40 mg.             Tobacco Use Initial Evaluation: Social History   Tobacco Use  Smoking Status Former   Types: Cigars   Passive exposure: Never  Smokeless Tobacco Never     Exercise Goals and Review:  Exercise Goals     Row Name 12/21/23 0917             Exercise Goals   Increase Physical Activity Yes       Intervention Provide advice, education, support and counseling about physical activity/exercise needs.;Develop an individualized exercise prescription for aerobic and resistive training based on initial evaluation findings, risk stratification, comorbidities and participant's personal goals.       Expected Outcomes Long Term: Add in home exercise to make exercise part of routine and to increase amount of physical activity.;Long Term: Exercising regularly at least 3-5 days a week.;Short Term: Attend rehab on a regular basis to increase amount of physical activity.       Increase Strength and Stamina Yes       Intervention Provide advice, education, support and counseling about physical activity/exercise needs.;Develop an individualized exercise prescription for aerobic and resistive training based on initial evaluation findings, risk stratification, comorbidities and participant's personal goals.       Expected Outcomes Short Term: Increase workloads from initial exercise prescription for resistance, speed, and METs.;Short Term: Perform resistance training exercises routinely during rehab and add in resistance training at home;Long Term: Improve cardiorespiratory fitness, muscular  endurance and strength as measured by increased METs and functional capacity ( )       Able to understand and use rate of perceived exertion (RPE) scale Yes       Intervention Provide education and explanation on how to use RPE scale       Expected Outcomes Short Term: Able to use RPE daily in rehab to express subjective intensity level;Long Term:  Able to use RPE to guide intensity level when exercising independently       Able to understand and use Dyspnea scale Yes       Intervention Provide education and explanation on how to use Dyspnea scale       Expected Outcomes Short  Term: Able to use Dyspnea scale daily in rehab to express subjective sense of shortness of breath during exertion;Long Term: Able to use Dyspnea scale to guide intensity level when exercising independently       Knowledge and understanding of Target Heart Rate Range (THRR) Yes       Intervention Provide education and explanation of THRR including how the numbers were predicted and where they are located for reference       Expected Outcomes Short Term: Able to state/look up THRR;Long Term: Able to use THRR to govern intensity when exercising independently;Short Term: Able to use daily as guideline for intensity in rehab       Able to check pulse independently Yes       Intervention Provide education and demonstration on how to check pulse in carotid and radial arteries.;Review the importance of being able to check your own pulse for safety during independent exercise       Expected Outcomes Short Term: Able to explain why pulse checking is important during independent exercise;Long Term: Able to check pulse independently and accurately       Understanding of Exercise Prescription Yes       Intervention Provide education, explanation, and written materials on patient's individual exercise prescription       Expected Outcomes Short Term: Able to explain program exercise prescription;Long Term: Able to explain home exercise prescription to exercise independently              Copy of goals given to participant.

## 2023-12-22 ENCOUNTER — Telehealth: Payer: Self-pay

## 2023-12-22 ENCOUNTER — Encounter (HOSPITAL_COMMUNITY): Payer: Self-pay | Admitting: *Deleted

## 2023-12-22 DIAGNOSIS — Z955 Presence of coronary angioplasty implant and graft: Secondary | ICD-10-CM

## 2023-12-22 DIAGNOSIS — I214 Non-ST elevation (NSTEMI) myocardial infarction: Secondary | ICD-10-CM

## 2023-12-22 NOTE — Telephone Encounter (Signed)
 error

## 2023-12-22 NOTE — Progress Notes (Signed)
 Cardiac Individual Treatment Plan  Patient Details  Name: [name] MRN: [id number] Date of Birth: 03-Feb-1951 Referring Provider:   Flowsheet Row CARDIAC REHAB PHASE II ORIENTATION from 12/21/2023 in [facility] CARDIAC REHABILITATION  Referring Provider Candelaria Stagers MD  [facility] Cardiologist: Dr. Onalee Hua Harding]       Initial Encounter Date:  Flowsheet Row CARDIAC REHAB PHASE II ORIENTATION from 12/21/2023 in [place] CARDIAC REHABILITATION  Date 12/21/23       Visit Diagnosis: NSTEMI (non-ST elevated myocardial infarction) [facility])  Status post coronary artery stent placement  Patient's Home Medications on Admission:  Current Outpatient Medications:    aspirin EC 81 MG tablet, Take 1 tablet (81 mg total) by mouth daily. Swallow whole., Disp: , Rfl:    DULoxetine (CYMBALTA) 30 MG capsule, Take 30 mg by mouth 2 (two) times daily., Disp: , Rfl:    Evolocumab (REPATHA SURECLICK) 140 MG/ML SOAJ, Inject 140 mg into the skin every 14 (fourteen) days., Disp: 2 mL, Rfl: 2   gabapentin (NEURONTIN) 300 MG capsule, Take 300 mg by mouth See admin instructions. One tablet in the morning and afternoon and two tablets at bedtime, Disp: , Rfl:    metoprolol succinate (TOPROL-XL) 25 MG 24 hr tablet, Take 0.5 tablets (12.5 mg total) by mouth daily., Disp: 45 tablet, Rfl: 0   nitroGLYCERIN (NITROSTAT) 0.4 MG SL tablet, Place 1 tablet (0.4 mg total) under the tongue every 5 (five) minutes as needed for chest pain., Disp: 25 tablet, Rfl: 11   pramipexole (MIRAPEX) 0.5 MG tablet, TAKE 1 TABLET AT DINNER AND 1 TABLET AT BEDTIME., Disp: 180 tablet, Rfl: 0   prasugrel (EFFIENT) 10 MG TABS tablet, Take 1 tablet (10 mg total) by mouth daily., Disp: 180 tablet, Rfl: 0   zolpidem (AMBIEN CR) 12.5 MG CR tablet, Take 1 tablet by mouth at bedtime., Disp: , Rfl:   Past Medical History: Past Medical History:  Diagnosis Date   Anemia    Anxiety    Arthritis    spinal stenosis   Depression    History of  kidney stones    Hyperlipidemia    Hypertension    Kidney function abnormal 1983   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   Sleep apnea    uses Cpap nightly    Tobacco Use: Social History   Tobacco Use  Smoking Status Former   Types: Cigars   Passive exposure: Never  Smokeless Tobacco Never    Labs: Review Flowsheet       Latest Ref Rng & Units 05/14/2017 12/05/2023  Labs for ITP Cardiac and Pulmonary Rehab  Cholestrol 0 - 200 mg/dL 469  629   LDL (calc) 0 - 99 mg/dL 528  413   HDL-C >24 mg/dL 29  38   Trlycerides <401 mg/dL 027  58   Hemoglobin O5D 4.8 - 5.6 % - 5.4     Capillary Blood Glucose: No results found for: "GLUCAP"   Exercise Target Goals: Exercise Program Goal: Individual exercise prescription set using results from initial 6 min walk test and THRR while considering  patient's activity barriers and safety.   Exercise Prescription Goal: Starting with aerobic activity 30 plus minutes a day, 3 days per week for initial exercise prescription. Provide home exercise prescription and guidelines that participant acknowledges understanding prior to discharge.  Activity Barriers & Risk Stratification:  Activity Barriers & Cardiac Risk Stratification - 12/21/23 0913       Activity  Barriers & Cardiac Risk Stratification   Activity Barriers Left Knee Replacement;Deconditioning;Shortness of Breath;Balance Concerns;Arthritis;Neck/Spine Problems;Joint Problems;Muscular Weakness   hx spinal stenosis, arthritis all over, bilateral knee surgeries (occassional pain)   Cardiac Risk Stratification Moderate             6 Minute Walk:  6 Minute Walk     Row Name 12/21/23 0913         6 Minute Walk   Phase Initial     Distance 1325 feet     Walk Time 6 minutes     # of Rest Breaks 0     MPH 2.51     METS 2.76     RPE 7     VO2 Peak 9.67     Symptoms No     Resting HR 78 bpm     Resting BP 104/62     Resting  Oxygen Saturation  97 %     Exercise Oxygen Saturation  during 6 min walk 98 %     Max Ex. HR 103 bpm     Max Ex. BP 148/66     2 Minute Post BP 114/62              Oxygen Initial Assessment:   Oxygen Re-Evaluation:   Oxygen Discharge (Final Oxygen Re-Evaluation):   Initial Exercise Prescription:  Initial Exercise Prescription - 12/21/23 0900       Date of Initial Exercise RX and Referring Provider   Date 12/21/23    Referring Provider Candelaria Stagers MD   Primary Cardiologist: Dr. Bryan Lemma     Oxygen   Maintain Oxygen Saturation 88% or higher      Treadmill   MPH 2.2    Grade 0.5    Minutes 15    METs 2.84      NuStep   Level 3    SPM 80    Minutes 15    METs 2.5      Prescription Details   Frequency (times per week) 2    Duration Progress to 30 minutes of continuous aerobic without signs/symptoms of physical distress      Intensity   THRR 40-80% of Max Heartrate 105-132    Ratings of Perceived Exertion 11-13    Perceived Dyspnea 0-4      Progression   Progression Continue to progress workloads to maintain intensity without signs/symptoms of physical distress.      Resistance Training   Training Prescription Yes    Weight 5 lb    Reps 10-15             Perform Capillary Blood Glucose checks as needed.  Exercise Prescription Changes:   Exercise Prescription Changes     Row Name 12/21/23 0900             Response to Exercise   Blood Pressure (Admit) 104/62       Blood Pressure (Exercise) 148/66       Blood Pressure (Exit) 114/62       Heart Rate (Admit) 78 bpm       Heart Rate (Exercise) 103 bpm       Heart Rate (Exit) 80 bpm       Oxygen Saturation (Admit) 97 %       Oxygen Saturation (Exercise) 98 %       Rating of Perceived Exertion (Exercise) 7       Symptoms none       Comments walk test results  Exercise Comments:   Exercise Goals and Review:   Exercise Goals     Row Name 12/21/23 0917              Exercise Goals   Increase Physical Activity Yes       Intervention Provide advice, education, support and counseling about physical activity/exercise needs.;Develop an individualized exercise prescription for aerobic and resistive training based on initial evaluation findings, risk stratification, comorbidities and participant's personal goals.       Expected Outcomes Long Term: Add in home exercise to make exercise part of routine and to increase amount of physical activity.;Long Term: Exercising regularly at least 3-5 days a week.;Short Term: Attend rehab on a regular basis to increase amount of physical activity.       Increase Strength and Stamina Yes       Intervention Provide advice, education, support and counseling about physical activity/exercise needs.;Develop an individualized exercise prescription for aerobic and resistive training based on initial evaluation findings, risk stratification, comorbidities and participant's personal goals.       Expected Outcomes Short Term: Increase workloads from initial exercise prescription for resistance, speed, and METs.;Short Term: Perform resistance training exercises routinely during rehab and add in resistance training at home;Long Term: Improve cardiorespiratory fitness, muscular endurance and strength as measured by increased METs and functional capacity ( )       Able to understand and use rate of perceived exertion (RPE) scale Yes       Intervention Provide education and explanation on how to use RPE scale       Expected Outcomes Short Term: Able to use RPE daily in rehab to express subjective intensity level;Long Term:  Able to use RPE to guide intensity level when exercising independently       Able to understand and use Dyspnea scale Yes       Intervention Provide education and explanation on how to use Dyspnea scale       Expected Outcomes Short Term: Able to use Dyspnea scale daily in rehab to express subjective sense of  shortness of breath during exertion;Long Term: Able to use Dyspnea scale to guide intensity level when exercising independently       Knowledge and understanding of Target Heart Rate Range (THRR) Yes       Intervention Provide education and explanation of THRR including how the numbers were predicted and where they are located for reference       Expected Outcomes Short Term: Able to state/look up THRR;Long Term: Able to use THRR to govern intensity when exercising independently;Short Term: Able to use daily as guideline for intensity in rehab       Able to check pulse independently Yes       Intervention Provide education and demonstration on how to check pulse in carotid and radial arteries.;Review the importance of being able to check your own pulse for safety during independent exercise       Expected Outcomes Short Term: Able to explain why pulse checking is important during independent exercise;Long Term: Able to check pulse independently and accurately       Understanding of Exercise Prescription Yes       Intervention Provide education, explanation, and written materials on patient's individual exercise prescription       Expected Outcomes Short Term: Able to explain program exercise prescription;Long Term: Able to explain home exercise prescription to exercise independently                Exercise  Goals Re-Evaluation :    Discharge Exercise Prescription (Final Exercise Prescription Changes):  Exercise Prescription Changes - 12/21/23 0900       Response to Exercise   Blood Pressure (Admit) 104/62    Blood Pressure (Exercise) 148/66    Blood Pressure (Exit) 114/62    Heart Rate (Admit) 78 bpm    Heart Rate (Exercise) 103 bpm    Heart Rate (Exit) 80 bpm    Oxygen Saturation (Admit) 97 %    Oxygen Saturation (Exercise) 98 %    Rating of Perceived Exertion (Exercise) 7    Symptoms none    Comments walk test results             Nutrition:  Target Goals: Understanding  of nutrition guidelines, daily intake of sodium 1500mg , cholesterol 200mg , calories 30% from fat and 7% or less from saturated fats, daily to have 5 or more servings of fruits and vegetables.  Biometrics:  Pre Biometrics - 12/21/23 0917       Pre Biometrics   Height 5\' 9"  (1.753 m)    Weight 203 lb 4.8 oz (92.2 kg)    Waist Circumference 39 inches    Hip Circumference 40 inches    Waist to Hip Ratio 0.98 %    BMI (Calculated) 30.01    Grip Strength 43.2 kg    Single Leg Stand 18.4 seconds              Nutrition Therapy Plan and Nutrition Goals:  Nutrition Therapy & Goals - 12/21/23 1526       Intervention Plan   Intervention Prescribe, educate and counsel regarding individualized specific dietary modifications aiming towards targeted core components such as weight, hypertension, lipid management, diabetes, heart failure and other comorbidities.;Nutrition handout(s) given to patient.    Expected Outcomes Short Term Goal: Understand basic principles of dietary content, such as calories, fat, sodium, cholesterol and nutrients.;Long Term Goal: Adherence to prescribed nutrition plan.             Nutrition Assessments:  MEDIFICTS Score Key: >=70 Need to make dietary changes  40-70 Heart Healthy Diet <= 40 Therapeutic Level Cholesterol Diet  Flowsheet Row CARDIAC REHAB PHASE II ORIENTATION from 12/21/2023 in [facility] CARDIAC RE[facility]ILITATION  Picture Your Plate Total Score on Admission 68      Picture Your Plate Scores: <27 Unhealthy dietary pattern with much room for improvement. 41-50 Dietary pattern unlikely to meet recommendations for good health and room for improvement. 51-60 More healthful dietary pattern, with some room for improvement.  >60 Healthy dietary pattern, although there may be some specific behaviors that could be improved.    Nutrition Goals Re-Evaluation:   Nutrition Goals Discharge (Final Nutrition Goals  Re-Evaluation):   Psychosocial: Target Goals: Acknowledge presence or absence of significant depression and/or stress, maximize coping skills, provide positive support system. Participant is able to verbalize types and ability to use techniques and skills needed for reducing stress and depression.  Initial Review & Psychosocial Screening:  Initial Psych Review & Screening - 12/20/23 1143       Initial Review   Current issues with None Identified      Family Dynamics   Good Support System? Yes      Barriers   Psychosocial barriers to participate in program There are no identifiable barriers or psychosocial needs.;The patient should benefit from training in stress management and relaxation.      Screening Interventions   Interventions Encouraged to exercise;To provide support and resources with  identified psychosocial needs;Provide feedback about the scores to participant    Expected Outcomes Short Term goal: Utilizing psychosocial counselor, staff and physician to assist with identification of specific Stressors or current issues interfering with healing process. Setting desired goal for each stressor or current issue identified.;Long Term Goal: Stressors or current issues are controlled or eliminated.;Short Term goal: Identification and review with participant of any Quality of Life or Depression concerns found by scoring the questionnaire.;Long Term goal: The participant improves quality of Life and PHQ9 Scores as seen by post scores and/or verbalization of changes             Quality of Life Scores:  Quality of Life - 12/21/23 0918       Quality of Life   Select Quality of Life      Quality of Life Scores   Health/Function Pre 22.9 %    Socioeconomic Pre 28.63 %    Psych/Spiritual Pre 26.63 %    Family Pre 26.5 %    GLOBAL Pre 24.74 %            Scores of 19 and below usually indicate a poorer quality of life in these areas.  A difference of  2-3 points is a  clinically meaningful difference.  A difference of 2-3 points in the total score of the Quality of Life Index has been associated with significant improvement in overall quality of life, self-image, physical symptoms, and general health in studies assessing change in quality of life.  PHQ-9: Review Flowsheet       12/21/2023 12/17/2023 05/25/2022  Depression screen PHQ 2/9  Decreased Interest 2 2 0 0  Down, Depressed, Hopeless 0 1 0 0  PHQ - 2 Score 2 3 0 0  Altered sleeping 1 0 -  Tired, decreased energy 2 2 -  Change in appetite 2 1 -  Feeling bad or failure about yourself  0 1 -  Trouble concentrating 1 1 -  Moving slowly or fidgety/restless 1 1 -  Suicidal thoughts 0 0 -  PHQ-9 Score 9 9 -  Difficult doing work/chores Somewhat difficult Somewhat difficult -    Details       Multiple values from one day are sorted in reverse-chronological order        Interpretation of Total Score  Total Score Depression Severity:  1-4 = Minimal depression, 5-9 = Mild depression, 10-14 = Moderate depression, 15-19 = Moderately severe depression, 20-27 = Severe depression   Psychosocial Evaluation and Intervention:  Psychosocial Evaluation - 12/20/23 1143       Psychosocial Evaluation & Interventions   Interventions Stress management education;Relaxation education;Encouraged to exercise with the program and follow exercise prescription    Comments Patient was referred to CR with NSTEMI/Stent placement. He denies any depression, anxiety or stressors in his life. He says he was a little anxious following his MI but this has resolved. He has OSA and has used a CPAP machine up until 1 year ago and had inspire implanted and this is working great for him. He sleeps well taking ambien. He has severe restless leg syndrome and takes cymbalta and gabapentin for this. He is getting over the flu and says he has some residual SOB and coughing. His wife is his main support person. He is retired as the  [employer] for 23 years. He enjoys working with wood and makes furniture. He seems very motivated to participate in the program. His main goals are to improve his SOB  and get his strength and stamina back and to get healthy overall. He has no barriers to complete the program identified.    Expected Outcomes Short Term: start the program and attend consistently. Long Term: complete the program meeting his personal goals.    Continue Psychosocial Services  Follow up required by staff             Psychosocial Re-Evaluation:   Psychosocial Discharge (Final Psychosocial Re-Evaluation):   Vocational Rehabilitation: Provide vocational rehab assistance to qualifying candidates.   Vocational Rehab Evaluation & Intervention:  Vocational Rehab - 12/20/23 1143       Initial Vocational Rehab Evaluation & Intervention   Assessment shows need for Vocational Rehabilitation No      Vocational Rehab Re-Evaulation   Comments Patient is retired.             Education: Education Goals: Education classes will be provided on a weekly basis, covering required topics. Participant will state understanding/return demonstration of topics presented.  Learning Barriers/Preferences:  Learning Barriers/Preferences - 12/21/23 8295       Learning Barriers/Preferences   Learning Barriers Sight   glasses for reading   Learning Preferences Verbal Instruction;Skilled Demonstration             Education Topics: Hypertension, Hypertension Reduction -Define heart disease and high blood pressure. Discus how high blood pressure affects the body and ways to reduce high blood pressure.   Exercise and Your Heart -Discuss why it is important to exercise, the FITT principles of exercise, normal and abnormal responses to exercise, and how to exercise safely.   Angina -Discuss definition of angina, causes of angina, treatment of angina, and how to decrease risk of having angina.   Cardiac  Medications -Review what the following cardiac medications are used for, how they affect the body, and side effects that may occur when taking the medications.  Medications include Aspirin, Beta blockers, calcium channel blockers, ACE Inhibitors, angiotensin receptor blockers, diuretics, digoxin, and antihyperlipidemics.   Congestive Heart Failure -Discuss the definition of CHF, how to live with CHF, the signs and symptoms of CHF, and how keep track of weight and sodium intake.   Heart Disease and Intimacy -Discus the effect sexual activity has on the heart, how changes occur during intimacy as we age, and safety during sexual activity.   Smoking Cessation / COPD -Discuss different methods to quit smoking, the health benefits of quitting smoking, and the definition of COPD.   Nutrition I: Fats -Discuss the types of cholesterol, what cholesterol does to the heart, and how cholesterol levels can be controlled.   Nutrition II: Labels -Discuss the different components of food labels and how to read food label   Heart Parts/Heart Disease and PAD -Discuss the anatomy of the heart, the pathway of blood circulation through the heart, and these are affected by heart disease.   Stress I: Signs and Symptoms -Discuss the causes of stress, how stress may lead to anxiety and depression, and ways to limit stress.   Stress II: Relaxation -Discuss different types of relaxation techniques to limit stress.   Warning Signs of Stroke / TIA -Discuss definition of a stroke, what the signs and symptoms are of a stroke, and how to identify when someone is having stroke.   Knowledge Questionnaire Score:  Knowledge Questionnaire Score - 12/21/23 0822       Knowledge Questionnaire Score   Pre Score 18/26             Core Components/Risk  Factors/Patient Goals at Admission:  Personal Goals and Risk Factors at Admission - 12/21/23 0922       Core Components/Risk Factors/Patient Goals on  Admission    Weight Management Weight Maintenance;Yes;Obesity;Weight Loss    Intervention Weight Management: Develop a combined nutrition and exercise program designed to reach desired caloric intake, while maintaining appropriate intake of nutrient and fiber, sodium and fats, and appropriate energy expenditure required for the weight goal.;Weight Management: Provide education and appropriate resources to help participant work on and attain dietary goals.;Weight Management/Obesity: Establish reasonable short term and long term weight goals.;Obesity: Provide education and appropriate resources to help participant work on and attain dietary goals.    Admit Weight 203 lb 4.8 oz (92.2 kg)    Goal Weight: Short Term 198 lb (89.8 kg)    Goal Weight: Long Term 198 lb (89.8 kg)    Expected Outcomes Short Term: Continue to assess and modify interventions until short term weight is achieved;Long Term: Adherence to nutrition and physical activity/exercise program aimed toward attainment of established weight goal;Weight Maintenance: Understanding of the daily nutrition guidelines, which includes 25-35% calories from fat, 7% or less cal from saturated fats, less than 200mg  cholesterol, less than 1.5gm of sodium, & 5 or more servings of fruits and vegetables daily;Weight Loss: Understanding of general recommendations for a balanced deficit meal plan, which promotes 1-2 lb weight loss per week and includes a negative energy balance of [phone] kcal/d;Understanding recommendations for meals to include 15-35% energy as protein, 25-35% energy from fat, 35-60% energy from carbohydrates, less than 200mg  of dietary cholesterol, 20-35 gm of total fiber daily;Understanding of distribution of calorie intake throughout the day with the consumption of 4-5 meals/snacks    Improve shortness of breath with ADL's Yes    Intervention Provide education, individualized exercise plan and daily activity instruction to help decrease symptoms  of SOB with activities of daily living.    Expected Outcomes Short Term: Improve cardiorespiratory fitness to achieve a reduction of symptoms when performing ADLs;Long Term: Be able to perform more ADLs without symptoms or delay the onset of symptoms    Hypertension Yes    Intervention Provide education on lifestyle modifcations including regular physical activity/exercise, weight management, moderate sodium restriction and increased consumption of fresh fruit, vegetables, and low fat dairy, alcohol moderation, and smoking cessation.;Monitor prescription use compliance.    Expected Outcomes Short Term: Continued assessment and intervention until BP is < 140/2mm HG in hypertensive participants. < 130/97mm HG in hypertensive participants with diabetes, heart failure or chronic kidney disease.;Long Term: Maintenance of blood pressure at goal levels.    Lipids Yes    Intervention Provide education and support for participant on nutrition & aerobic/resistive exercise along with prescribed medications to achieve LDL 70mg , HDL >40mg .    Expected Outcomes Short Term: Participant states understanding of desired cholesterol values and is compliant with medications prescribed. Participant is following exercise prescription and nutrition guidelines.;Long Term: Cholesterol controlled with medications as prescribed, with individualized exercise RX and with personalized nutrition plan. Value goals: LDL < 70mg , HDL > 40 mg.             Core Components/Risk Factors/Patient Goals Review:    Core Components/Risk Factors/Patient Goals at Discharge (Final Review):    ITP Comments:  ITP Comments     Row Name 12/20/23 1153 12/21/23 0912 12/22/23 1003       ITP Comments Virtual orientation visit for cardiac rehab with NSTEMI/Stent placement completed. On-site orientation visit scheduled for 12/21/23 at  8:30. Paperwork sent through [employer]. Patient says he will completed prior to arrival. Patient attend  orientation today.  Patient is attending Cardiac Rehabilitation Program.  Documentation for diagnosis can be found in [facility] 12/03/23.  Reviewed medical chart, RPE/RPD, gym safety, and program guidelines.  Patient was fitted to equipment they will be using during rehab.  Patient is scheduled to start exercise on Thursday 12/23/23 at 1030.   Initial ITP created and sent for review and signature by Dr. Dina Rich, Medical Director for Cardiac Rehabilitation Program. 30 day review completed. ITP sent to Dr. Dina Rich, Medical Director of Cardiac Rehab. Continue with ITP unless changes are made by physician.  Pt was oriented yesterday.              Comments: 30 day review

## 2023-12-23 ENCOUNTER — Telehealth: Payer: Self-pay | Admitting: Internal Medicine

## 2023-12-23 ENCOUNTER — Encounter (HOSPITAL_COMMUNITY)
Admission: RE | Admit: 2023-12-23 | Discharge: 2023-12-23 | Discharge status: Home / Self Care | Payer: Medicare Other | Source: Ambulatory Visit | Attending: Cardiology

## 2023-12-23 DIAGNOSIS — I214 Non-ST elevation (NSTEMI) myocardial infarction: Secondary | ICD-10-CM

## 2023-12-23 DIAGNOSIS — Z955 Presence of coronary angioplasty implant and graft: Secondary | ICD-10-CM

## 2023-12-23 NOTE — Progress Notes (Signed)
 Daily Session Note  Patient Details  Name: [name] MRN: [id number] Date of Birth: 19-Jul-1951 Referring Provider:   Flowsheet Row CARDIAC REHAB PHASE II ORIENTATION from 12/21/2023 in [facility] CARDIAC REHABILITATION  Referring Provider Candelaria Stagers MD  [facility] Cardiologist: Dr. Onalee Hua Harding]       Encounter Date: 12/23/2023  Check In:  Session Check In - 12/23/23 1036       Check-In   Supervising physician immediately available to respond to emergencies See telemetry face sheet for immediately available MD    Location [facility]    Staff Present Ross Ludwig, BS, Exercise Physiologist;Brittany Foley, BSN, RN;Tinsleigh Slovacek Laural Benes, RN, BSN    Virtual Visit No    Medication changes reported     No    Fall or balance concerns reported    No    Warm-up and Cool-down Performed on first and last piece of equipment    Resistance Training Performed Yes    VAD Patient? No    PAD/SET Patient? No      Pain Assessment   Currently in Pain? No/denies             Capillary Blood Glucose: No results found for this or any previous visit (from the past 24 hours).    Social History   Tobacco Use  Smoking Status Former   Types: Cigars   Passive exposure: Never  Smokeless Tobacco Never    Goals Met:  Independence with exercise equipment Exercise tolerated well No report of concerns or symptoms today Strength training completed today  Goals Unmet:  Not Applicable  Comments: Pt able to follow exercise prescription today without complaint.  Will continue to monitor for progression.

## 2023-12-23 NOTE — Telephone Encounter (Signed)
 Patient came by the office asked if his pcp can begin prescribe these medicines going forward. Originally given by hospital.   Rx #: [id number]  metoprolol succinate (TOPROL-XL) 25 MG 24 hr tablet [[id number]]   Rx #: [id number]  prasugrel (EFFIENT) 10 MG TABS tablet [[id number]]   Pharmacy: [facility] pharmacy

## 2023-12-28 ENCOUNTER — Other Ambulatory Visit: Payer: Self-pay

## 2023-12-28 ENCOUNTER — Encounter (HOSPITAL_COMMUNITY)
Admission: RE | Admit: 2023-12-28 | Discharge: 2023-12-28 | Disposition: A | Discharge status: Home / Self Care | Payer: Medicare Other | Source: Ambulatory Visit | Attending: Cardiology | Admitting: Cardiology

## 2023-12-28 DIAGNOSIS — I214 Non-ST elevation (NSTEMI) myocardial infarction: Secondary | ICD-10-CM | POA: Insufficient documentation

## 2023-12-28 DIAGNOSIS — Z955 Presence of coronary angioplasty implant and graft: Secondary | ICD-10-CM | POA: Diagnosis not present

## 2023-12-28 MED ORDER — METOPROLOL SUCCINATE ER 25 MG PO TB24: 12.5000 mg | ORAL_TABLET | Freq: Every day | ORAL | 0 refills | Status: DC

## 2023-12-28 MED ORDER — PRASUGREL HCL 10 MG PO TABS: 10.0000 mg | ORAL_TABLET | Freq: Every day | ORAL | 0 refills | Status: DC

## 2023-12-28 NOTE — Progress Notes (Signed)
 Daily Session Note  Patient Details  Name: [name] MRN: [id number] Date of Birth: [birth year] Referring Provider:   Flowsheet Row CARDIAC REHAB PHASE II ORIENTATION from 12/21/2023 in [facility] CARDIAC REHABILITATION  Referring Provider Candelaria Stagers MD  [facility] Cardiologist: Dr. Onalee Hua Harding]       Encounter Date: 12/28/2023  Check In:  Session Check In - 12/28/23 1029       Check-In   Supervising physician immediately available to respond to emergencies See telemetry face sheet for immediately available MD    Location [facility]    Staff Present Fabio Pierce, MA, RCEP, CCRP, CCET;Mayson Mcneish Fredric Mare, [place], Exercise Physiologist    Virtual Visit No    Medication changes reported     No    Fall or balance concerns reported    No    Tobacco Cessation No Change    Warm-up and Cool-down Performed on first and last piece of equipment    Resistance Training Performed Yes    VAD Patient? No    PAD/SET Patient? No      Pain Assessment   Currently in Pain? No/denies    Pain Score 0-No pain    Multiple Pain Sites No             Capillary Blood Glucose: No results found for this or any previous visit (from the past 24 hours).    Social History   Tobacco Use  Smoking Status Former   Types: Cigars   Passive exposure: Never  Smokeless Tobacco Never    Goals Met:  Independence with exercise equipment Exercise tolerated well No report of concerns or symptoms today Strength training completed today  Goals Unmet:  Not Applicable  Comments: Pt able to follow exercise prescription today without complaint.  Will continue to monitor for progression.

## 2023-12-28 NOTE — Telephone Encounter (Signed)
 Refills sent to pharmacy.

## 2023-12-30 ENCOUNTER — Encounter (HOSPITAL_COMMUNITY)
Admission: RE | Admit: 2023-12-30 | Discharge: 2023-12-30 | Disposition: A | Discharge status: Home / Self Care | Payer: Medicare Other | Source: Ambulatory Visit | Attending: Cardiology | Admitting: Cardiology

## 2023-12-30 DIAGNOSIS — Z955 Presence of coronary angioplasty implant and graft: Secondary | ICD-10-CM | POA: Diagnosis not present

## 2023-12-30 DIAGNOSIS — I214 Non-ST elevation (NSTEMI) myocardial infarction: Secondary | ICD-10-CM

## 2023-12-30 NOTE — Progress Notes (Signed)
 Daily Session Note  Patient Details  Name: [name] MRN: [id number] Date of Birth: April 07, 1951 Referring Provider:   Flowsheet Row CARDIAC REHAB PHASE II ORIENTATION from 12/21/2023 in [facility] CARDIAC REHABILITATION  Referring Provider Candelaria Stagers MD  [facility] Cardiologist: Dr. Onalee Hua Harding]       Encounter Date: 12/30/2023  Check In:  Session Check In - 12/30/23 1015       Check-In   Supervising physician immediately available to respond to emergencies See telemetry face sheet for immediately available MD    Location [facility]    Staff Present Fabio Pierce, MA, RCEP, CCRP, CCET;Heather Fredric Mare, BS, Exercise Physiologist;Kenzlee Fishburn Laural Benes, RN, BSN;Hillary Troutman BSN, RN    Virtual Visit No    Medication changes reported     No    Fall or balance concerns reported    No    Warm-up and Cool-down Performed on first and last piece of equipment    Resistance Training Performed Yes    VAD Patient? No    PAD/SET Patient? No      Pain Assessment   Currently in Pain? No/denies    Pain Score 0-No pain    Multiple Pain Sites No             Capillary Blood Glucose: No results found for this or any previous visit (from the past 24 hours).    Social History   Tobacco Use  Smoking Status Former   Types: Cigars   Passive exposure: Never  Smokeless Tobacco Never    Goals Met:  Independence with exercise equipment Exercise tolerated well No report of concerns or symptoms today Strength training completed today  Goals Unmet:  Not Applicable  Comments: Pt able to follow exercise prescription today without complaint.  Will continue to monitor for progression.

## 2023-12-31 NOTE — Telephone Encounter (Signed)
 Created in error

## 2024-01-04 ENCOUNTER — Encounter (HOSPITAL_COMMUNITY)
Admission: RE | Admit: 2024-01-04 | Discharge: 2024-01-04 | Disposition: A | Discharge status: Home / Self Care | Payer: Medicare Other | Source: Ambulatory Visit | Attending: Cardiology

## 2024-01-04 DIAGNOSIS — I214 Non-ST elevation (NSTEMI) myocardial infarction: Secondary | ICD-10-CM

## 2024-01-04 DIAGNOSIS — Z955 Presence of coronary angioplasty implant and graft: Secondary | ICD-10-CM | POA: Diagnosis not present

## 2024-01-04 NOTE — Progress Notes (Signed)
 Daily Session Note  Patient Details  Name: [name] MRN: [id number] Date of Birth: 1951-10-03 Referring Provider:   Flowsheet Row CARDIAC REHAB PHASE II ORIENTATION from 12/21/2023 in [facility] CARDIAC REHABILITATION  Referring Provider Candelaria Stagers MD  [facility] Cardiologist: Dr. Onalee Hua Harding]       Encounter Date: 01/04/2024  Check In:  Session Check In - 01/04/24 1015       Check-In   Supervising physician immediately available to respond to emergencies See telemetry face sheet for immediately available MD    Location [facility]    Staff Present Desiree Lucy, BSN, RN, WTA-C;Heather Fredric Mare, BS, Exercise Physiologist;Jessica Juanetta Gosling, MA, RCEP, CCRP, CCET    Virtual Visit No    Medication changes reported     No    Fall or balance concerns reported    No    Tobacco Cessation No Change    Resistance Training Performed Yes    VAD Patient? No    PAD/SET Patient? No      Pain Assessment   Currently in Pain? No/denies             Capillary Blood Glucose: No results found for this or any previous visit (from the past 24 hours).    Social History   Tobacco Use  Smoking Status Former   Types: Cigars   Passive exposure: Never  Smokeless Tobacco Never    Goals Met:  Proper associated with RPD/PD & O2 Sat Independence with exercise equipment No report of concerns or symptoms today Strength training completed today  Goals Unmet:  Not Applicable  Comments: Pt able to follow exercise prescription today without complaint.  Will continue to monitor for progression.

## 2024-01-06 ENCOUNTER — Encounter (HOSPITAL_COMMUNITY)
Admission: RE | Admit: 2024-01-06 | Discharge: 2024-01-06 | Disposition: A | Discharge status: Home / Self Care | Payer: Medicare Other | Source: Ambulatory Visit | Attending: Cardiology | Admitting: Cardiology

## 2024-01-06 DIAGNOSIS — Z955 Presence of coronary angioplasty implant and graft: Secondary | ICD-10-CM

## 2024-01-06 DIAGNOSIS — I214 Non-ST elevation (NSTEMI) myocardial infarction: Secondary | ICD-10-CM

## 2024-01-06 NOTE — Progress Notes (Signed)
 Daily Session Note  Patient Details  Name: [name] MRN: [id number] Date of Birth: 1951/01/07 Referring Provider:   Flowsheet Row CARDIAC REHAB PHASE II ORIENTATION from 12/21/2023 in [facility] CARDIAC REHABILITATION  Referring Provider Candelaria Stagers MD  [facility] Cardiologist: Dr. Onalee Hua Harding]       Encounter Date: 01/06/2024  Check In:  Session Check In - 01/06/24 1015       Check-In   Supervising physician immediately available to respond to emergencies See telemetry face sheet for immediately available MD    Location [facility]    Staff Present Avanell Shackleton BSN, RN;Heather Fredric Mare, BS, Exercise Physiologist;Brittany Roseanne Reno, BSN, RN, WTA-C    Virtual Visit No    Medication changes reported     No    Fall or balance concerns reported    No    Tobacco Cessation No Change    Warm-up and Cool-down Performed on first and last piece of equipment    Resistance Training Performed Yes    VAD Patient? No    PAD/SET Patient? No      Pain Assessment   Currently in Pain? No/denies    Pain Score 0-No pain    Multiple Pain Sites No             Capillary Blood Glucose: No results found for this or any previous visit (from the past 24 hours).    Social History   Tobacco Use  Smoking Status Former   Types: Cigars   Passive exposure: Never  Smokeless Tobacco Never    Goals Met:  Independence with exercise equipment Exercise tolerated well No report of concerns or symptoms today Strength training completed today  Goals Unmet:  Not Applicable  Comments: Marland KitchenMarland KitchenPt able to follow exercise prescription today without complaint.  Will continue to monitor for progression.

## 2024-01-07 DIAGNOSIS — Z6830 Body mass index (BMI) 30.0-30.9, adult: Secondary | ICD-10-CM | POA: Diagnosis not present

## 2024-01-07 DIAGNOSIS — M48062 Spinal stenosis, lumbar region with neurogenic claudication: Secondary | ICD-10-CM | POA: Diagnosis not present

## 2024-01-11 ENCOUNTER — Encounter (HOSPITAL_COMMUNITY)
Admission: RE | Admit: 2024-01-11 | Discharge: 2024-01-11 | Disposition: A | Discharge status: Home / Self Care | Payer: Medicare Other | Source: Ambulatory Visit | Attending: Cardiology | Admitting: Cardiology

## 2024-01-11 DIAGNOSIS — I214 Non-ST elevation (NSTEMI) myocardial infarction: Secondary | ICD-10-CM | POA: Diagnosis not present

## 2024-01-11 DIAGNOSIS — Z955 Presence of coronary angioplasty implant and graft: Secondary | ICD-10-CM | POA: Diagnosis not present

## 2024-01-11 NOTE — Progress Notes (Addendum)
 Daily Session Note  Patient Details  Name: [name] MRN: [id number] Date of Birth: 04/12/1951 Referring Provider:   Flowsheet Row CARDIAC REHAB PHASE II ORIENTATION from 12/21/2023 in [facility] CARDIAC REHABILITATION  Referring Provider Candelaria Stagers MD  [facility] Cardiologist: Dr. Onalee Hua Harding]       Encounter Date: 01/11/2024  Check In:  Session Check In - 01/11/24 1033       Check-In   Supervising physician immediately available to respond to emergencies See telemetry face sheet for immediately available MD    Location [facility]    Staff Present Ross Ludwig, BS, Exercise Physiologist;Rashod Gougeon Juanetta Gosling, MA, RCEP, CCRP, CCET;Brittany Roseanne Reno, BSN, RN, WTA-C    Virtual Visit No    Medication changes reported     No    Fall or balance concerns reported    No    Warm-up and Cool-down Performed on first and last piece of equipment    Resistance Training Performed Yes    VAD Patient? No    PAD/SET Patient? No      Pain Assessment   Currently in Pain? No/denies             Capillary Blood Glucose: No results found for this or any previous visit (from the past 24 hours).    Social History   Tobacco Use  Smoking Status Former   Types: Cigars   Passive exposure: Never  Smokeless Tobacco Never    Goals Met:  Independence with exercise equipment Exercise tolerated well Personal goals reviewed No report of concerns or symptoms today Strength training completed today  Goals Unmet:  Not Applicable  Comments: Pt able to follow exercise prescription today without complaint.  Will continue to monitor for progression.  Cardiac:  Reviewed home exercise with pt today.  Pt plans to walk at home for exercise.  We also talked about using staff videos on bad weather days. Reviewed THR, pulse, RPE, sign and symptoms, pulse oximetery and when to call 911 or MD.  Also discussed weather considerations and indoor options.  Pt voiced understanding.

## 2024-01-12 ENCOUNTER — Other Ambulatory Visit: Payer: Self-pay | Admitting: Internal Medicine

## 2024-01-12 DIAGNOSIS — I214 Non-ST elevation (NSTEMI) myocardial infarction: Secondary | ICD-10-CM

## 2024-01-12 DIAGNOSIS — Z789 Other specified health status: Secondary | ICD-10-CM

## 2024-01-12 DIAGNOSIS — E782 Mixed hyperlipidemia: Secondary | ICD-10-CM

## 2024-01-12 DIAGNOSIS — I2510 Atherosclerotic heart disease of native coronary artery without angina pectoris: Secondary | ICD-10-CM

## 2024-01-12 MED ORDER — REPATHA SURECLICK 140 MG/ML ~~LOC~~ SOAJ: 140.0000 mg | SUBCUTANEOUS | 2 refills | Status: DC

## 2024-01-12 NOTE — Telephone Encounter (Signed)
 Not our patient

## 2024-01-12 NOTE — Telephone Encounter (Signed)
 Copied from CRM [id number]. Topic: Clinical - Prescription Issue >> Jan 12, 2024  9:25 AM Shon Hale wrote: Reason for CRM: Requesting verbal approval for a replacement for Evolocumab (REPATHA SURECLICK) 140 MG/ML SOA. Patient spoke to company and replacement will be given to him at no cost as patient received a damaged/deffective pen.   Best callback number: [phone] Reference #: [id number]

## 2024-01-13 ENCOUNTER — Encounter (HOSPITAL_COMMUNITY)
Admission: RE | Admit: 2024-01-13 | Discharge: 2024-01-13 | Disposition: A | Discharge status: Home / Self Care | Payer: Medicare Other | Source: Ambulatory Visit | Attending: Cardiology | Admitting: Cardiology

## 2024-01-13 DIAGNOSIS — Z955 Presence of coronary angioplasty implant and graft: Secondary | ICD-10-CM | POA: Diagnosis not present

## 2024-01-13 DIAGNOSIS — I214 Non-ST elevation (NSTEMI) myocardial infarction: Secondary | ICD-10-CM

## 2024-01-13 NOTE — Progress Notes (Signed)
 Daily Session Note  Patient Details  Name: [name] MRN: [id number] Date of Birth: [birth year] Referring Provider:   Flowsheet Row CARDIAC REHAB PHASE II ORIENTATION from 12/21/2023 in [facility] CARDIAC REHABILITATION  Referring Provider Candelaria Stagers MD  [facility] Cardiologist: Dr. Onalee Hua Harding]       Encounter Date: 01/13/2024  Check In:  Session Check In - 01/13/24 1015       Check-In   Supervising physician immediately available to respond to emergencies See telemetry face sheet for immediately available ER MD    Location [facility]    Staff Present Ross Ludwig, BS, Exercise Physiologist;Jessica Juanetta Gosling, MA, RCEP, CCRP, Dow Adolph, RN, BSN;Hillary Troutman BSN, RN    Virtual Visit No    Medication changes reported     Yes    Comments Patient started Repatha yesterday.    Fall or balance concerns reported    No    Tobacco Cessation No Change    Warm-up and Cool-down Performed on first and last piece of equipment    Resistance Training Performed Yes    VAD Patient? No    PAD/SET Patient? No      Pain Assessment   Currently in Pain? No/denies    Pain Score 0-No pain    Multiple Pain Sites No             Capillary Blood Glucose: No results found for this or any previous visit (from the past 24 hours).    Social History   Tobacco Use  Smoking Status Former   Types: Cigars   Passive exposure: Never  Smokeless Tobacco Never    Goals Met:  Independence with exercise equipment Exercise tolerated well No report of concerns or symptoms today Strength training completed today  Goals Unmet:  Not Applicable  Comments: Pt able to follow exercise prescription today without complaint.  Will continue to monitor for progression.

## 2024-01-14 ENCOUNTER — Ambulatory Visit: Payer: Medicare Other

## 2024-01-14 ENCOUNTER — Telehealth: Payer: Self-pay | Admitting: Pharmacist

## 2024-01-14 DIAGNOSIS — E785 Hyperlipidemia, unspecified: Secondary | ICD-10-CM

## 2024-01-14 NOTE — Telephone Encounter (Signed)
 Patient had appointment with PharmD for lipid clinic to initiate PCSK9i, which was already initiated by PCP and pt already took 2 injections. He does not have any concern regarding Repatha use so he has cancelled the appointment. Will order lipid lab which will be due end of May 2025 if LDL still remain above the goal we will schedule appointment to discusses other lipid lowering therapy.

## 2024-01-14 NOTE — Progress Notes (Deleted)
 Patient ID: [name]                 DOB: [birth year]                    MRN: [id number]      HPI: [name] is a 73 y.o. male patient referred to lipid clinic by Yvonna Alanis, PA. PMH is significant for CAD, hx of NSTEMI 12/03/2023 status post DES to D1, proximal-mid LAD, hypertension, hyperlipidemia, CKD (CrCl 58 per Sr cr from 11/2023), OSA.    Patient was just recently admitted to the hospital on 02/07 for NSTEMI. Received 2 stents 121 and to his proximal/mid LAD. 90% and 70% stenosis respectively. He was discharged on aspirin/Effient with recommendations to maintain for at least 1 year and then convert to Plavix monotherapy. statin intolerant failing multiple statins in the past,referred to lipid clinic for PCSK9 inhibitor. Hx of CK elevation 11/2023 1486 went down to 635 U/L. AST elevated to 221 U/L >3XUNL  Reviewed options for lowering LDL cholesterol, including ezetimibe, PCSK-9 inhibitors, bempedoic acid and inclisiran.  Discussed mechanisms of action, dosing, side effects and potential decreases in LDL cholesterol.  Also reviewed cost information and potential options for patient assistance.  Current Medications: Repatha 140 mg every 14 days  Intolerances: statins  Risk Factors:  LDL goal: <70 mg/dl  Last lab 28/41: LDL 324, HDL 38, TC 158, TG 58 Lp(a) 70.7 nmol/L   Diet:   Exercise:   Family History:   Social History:   Labs:  Lipid Panel     Component Value Date/Time   CHOL 158 12/05/2023 0403   TRIG 58 12/05/2023 0403   HDL 38 (L) 12/05/2023 0403   CHOLHDL 4.2 12/05/2023 0403   VLDL 12 12/05/2023 0403   LDLCALC 108 (H) 12/05/2023 0403    Past Medical History:  Diagnosis Date   Anemia    Anxiety    Arthritis    spinal stenosis   Depression    History of kidney stones    Hyperlipidemia    Hypertension    Kidney function abnormal 1983   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   Sleep apnea    uses  Cpap nightly    Current Outpatient Medications on File Prior to Visit  Medication Sig Dispense Refill   aspirin EC 81 MG tablet Take 1 tablet (81 mg total) by mouth daily. Swallow whole.     DULoxetine (CYMBALTA) 30 MG capsule Take 30 mg by mouth 2 (two) times daily.     Evolocumab (REPATHA SURECLICK) 140 MG/ML SOAJ Inject 140 mg into the skin every 14 (fourteen) days. 2 mL 2   gabapentin (NEURONTIN) 300 MG capsule Take 300 mg by mouth See admin instructions. One tablet in the morning and afternoon and two tablets at bedtime     metoprolol succinate (TOPROL-XL) 25 MG 24 hr tablet Take 0.5 tablets (12.5 mg total) by mouth daily. 45 tablet 0   nitroGLYCERIN (NITROSTAT) 0.4 MG SL tablet Place 1 tablet (0.4 mg total) under the tongue every 5 (five) minutes as needed for chest pain. 25 tablet 11   pramipexole (MIRAPEX) 0.5 MG tablet TAKE 1 TABLET AT DINNER AND 1 TABLET AT BEDTIME. 180 tablet 0   prasugrel (EFFIENT) 10 MG TABS tablet Take 1 tablet (10 mg total) by mouth daily. 180 tablet 0   zolpidem (AMBIEN CR) 12.5 MG CR tablet Take 1 tablet by mouth at  bedtime.     No current facility-administered medications on file prior to visit.    Allergies  Allergen Reactions   Statins Other (See Comments)    Muscle pain   Nsaids Other (See Comments)    "Cannot take because of my kidney disease."    Assessment/Plan:  1. Hyperlipidemia -  No problems updated. No problem-specific Assessment & Plan notes found for this encounter.    Thank you,  Carmela Hurt, Pharm.D [facility] A Division of [facility] [address] [place], [place] [address]  Phone: [phone]; Fax: [phone]

## 2024-01-18 ENCOUNTER — Encounter (HOSPITAL_COMMUNITY): Payer: Medicare Other

## 2024-01-18 DIAGNOSIS — R208 Other disturbances of skin sensation: Secondary | ICD-10-CM | POA: Diagnosis not present

## 2024-01-18 DIAGNOSIS — Z789 Other specified health status: Secondary | ICD-10-CM | POA: Diagnosis not present

## 2024-01-18 DIAGNOSIS — L820 Inflamed seborrheic keratosis: Secondary | ICD-10-CM | POA: Diagnosis not present

## 2024-01-18 DIAGNOSIS — L2989 Other pruritus: Secondary | ICD-10-CM | POA: Diagnosis not present

## 2024-01-18 DIAGNOSIS — L538 Other specified erythematous conditions: Secondary | ICD-10-CM | POA: Diagnosis not present

## 2024-01-19 ENCOUNTER — Encounter (HOSPITAL_COMMUNITY): Payer: Self-pay | Admitting: *Deleted

## 2024-01-19 ENCOUNTER — Encounter (HOSPITAL_COMMUNITY)
Admission: RE | Admit: 2024-01-19 | Discharge: 2024-01-19 | Disposition: A | Discharge status: Home / Self Care | Source: Ambulatory Visit | Attending: Cardiology | Admitting: Cardiology

## 2024-01-19 DIAGNOSIS — Z955 Presence of coronary angioplasty implant and graft: Secondary | ICD-10-CM

## 2024-01-19 DIAGNOSIS — I214 Non-ST elevation (NSTEMI) myocardial infarction: Secondary | ICD-10-CM

## 2024-01-19 NOTE — Progress Notes (Signed)
 Cardiac Individual Treatment Plan  Patient Details  Name: [name] MRN: [id number] Date of Birth: 04/13/1951 Referring Provider:   Flowsheet Row CARDIAC REHAB PHASE II ORIENTATION from 12/21/2023 in [facility] CARDIAC REHABILITATION  Referring Provider Candelaria Stagers MD  [facility] Cardiologist: Dr. Onalee Hua Harding]       Initial Encounter Date:  Flowsheet Row CARDIAC REHAB PHASE II ORIENTATION from 12/21/2023 in [place] CARDIAC REHABILITATION  Date 12/21/23       Visit Diagnosis: NSTEMI (non-ST elevated myocardial infarction) [facility])  Status post coronary artery stent placement  Patient's Home Medications on Admission:  Current Outpatient Medications:    aspirin EC 81 MG tablet, Take 1 tablet (81 mg total) by mouth daily. Swallow whole., Disp: , Rfl:    DULoxetine (CYMBALTA) 30 MG capsule, Take 30 mg by mouth 2 (two) times daily., Disp: , Rfl:    Evolocumab (REPATHA SURECLICK) 140 MG/ML SOAJ, Inject 140 mg into the skin every 14 (fourteen) days., Disp: 2 mL, Rfl: 2   gabapentin (NEURONTIN) 300 MG capsule, Take 300 mg by mouth See admin instructions. One tablet in the morning and afternoon and two tablets at bedtime, Disp: , Rfl:    metoprolol succinate (TOPROL-XL) 25 MG 24 hr tablet, Take 0.5 tablets (12.5 mg total) by mouth daily., Disp: 45 tablet, Rfl: 0   nitroGLYCERIN (NITROSTAT) 0.4 MG SL tablet, Place 1 tablet (0.4 mg total) under the tongue every 5 (five) minutes as needed for chest pain., Disp: 25 tablet, Rfl: 11   pramipexole (MIRAPEX) 0.5 MG tablet, TAKE 1 TABLET AT DINNER AND 1 TABLET AT BEDTIME., Disp: 180 tablet, Rfl: 0   prasugrel (EFFIENT) 10 MG TABS tablet, Take 1 tablet (10 mg total) by mouth daily., Disp: 180 tablet, Rfl: 0   zolpidem (AMBIEN CR) 12.5 MG CR tablet, Take 1 tablet by mouth at bedtime., Disp: , Rfl:   Past Medical History: Past Medical History:  Diagnosis Date   Anemia    Anxiety    Arthritis    spinal stenosis   Depression    History of  kidney stones    Hyperlipidemia    Hypertension    Kidney function abnormal 1983   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   Sleep apnea    uses Cpap nightly    Tobacco Use: Social History   Tobacco Use  Smoking Status Former   Types: Cigars   Passive exposure: Never  Smokeless Tobacco Never    Labs: Review Flowsheet       Latest Ref Rng & Units 05/14/2017 12/05/2023  Labs for ITP Cardiac and Pulmonary Rehab  Cholestrol 0 - 200 mg/dL 784  696   LDL (calc) 0 - 99 mg/dL 295  284   HDL-C >13 mg/dL 29  38   Trlycerides <244 mg/dL 010  58   Hemoglobin U7O 4.8 - 5.6 % - 5.4     Capillary Blood Glucose: No results found for: "GLUCAP"   Exercise Target Goals: Exercise Program Goal: Individual exercise prescription set using results from initial 6 min walk test and THRR while considering  patient's activity barriers and safety.   Exercise Prescription Goal: Starting with aerobic activity 30 plus minutes a day, 3 days per week for initial exercise prescription. Provide home exercise prescription and guidelines that participant acknowledges understanding prior to discharge.  Activity Barriers & Risk Stratification:  Activity Barriers & Cardiac Risk Stratification - 12/21/23 0913       Activity  Barriers & Cardiac Risk Stratification   Activity Barriers Left Knee Replacement;Deconditioning;Shortness of Breath;Balance Concerns;Arthritis;Neck/Spine Problems;Joint Problems;Muscular Weakness   hx spinal stenosis, arthritis all over, bilateral knee surgeries (occassional pain)   Cardiac Risk Stratification Moderate             6 Minute Walk:  6 Minute Walk     Row Name 12/21/23 0913         6 Minute Walk   Phase Initial     Distance 1325 feet     Walk Time 6 minutes     # of Rest Breaks 0     MPH 2.51     METS 2.76     RPE 7     VO2 Peak 9.67     Symptoms No     Resting HR 78 bpm     Resting BP 104/62     Resting  Oxygen Saturation  97 %     Exercise Oxygen Saturation  during 6 min walk 98 %     Max Ex. HR 103 bpm     Max Ex. BP 148/66     2 Minute Post BP 114/62              Oxygen Initial Assessment:   Oxygen Re-Evaluation:   Oxygen Discharge (Final Oxygen Re-Evaluation):   Initial Exercise Prescription:  Initial Exercise Prescription - 12/21/23 0900       Date of Initial Exercise RX and Referring Provider   Date 12/21/23    Referring Provider Candelaria Stagers MD   Primary Cardiologist: Dr. Bryan Lemma     Oxygen   Maintain Oxygen Saturation 88% or higher      Treadmill   MPH 2.2    Grade 0.5    Minutes 15    METs 2.84      NuStep   Level 3    SPM 80    Minutes 15    METs 2.5      Prescription Details   Frequency (times per week) 2    Duration Progress to 30 minutes of continuous aerobic without signs/symptoms of physical distress      Intensity   THRR 40-80% of Max Heartrate 105-132    Ratings of Perceived Exertion 11-13    Perceived Dyspnea 0-4      Progression   Progression Continue to progress workloads to maintain intensity without signs/symptoms of physical distress.      Resistance Training   Training Prescription Yes    Weight 5 lb    Reps 10-15             Perform Capillary Blood Glucose checks as needed.  Exercise Prescription Changes:   Exercise Prescription Changes     Row Name 12/21/23 0900 12/30/23 1200 01/11/24 1000         Response to Exercise   Blood Pressure (Admit) 104/62 110/66 --     Blood Pressure (Exercise) 148/66 146/72 --     Blood Pressure (Exit) 114/62 118/60 --     Heart Rate (Admit) 78 bpm 76 bpm --     Heart Rate (Exercise) 103 bpm 117 bpm --     Heart Rate (Exit) 80 bpm 88 bpm --     Oxygen Saturation (Admit) 97 % -- --     Oxygen Saturation (Exercise) 98 % -- --     Rating of Perceived Exertion (Exercise) 7 11 --     Symptoms none -- --     Comments walk  test results -- --     Duration -- Continue with 30  min of aerobic exercise without signs/symptoms of physical distress. --     Intensity -- THRR unchanged --       Progression   Progression -- Continue to progress workloads to maintain intensity without signs/symptoms of physical distress. --       Paramedic Prescription -- Yes --     Weight -- 5 --     Reps -- 10-15 --       Treadmill   MPH -- 2.5 --     Grade -- 1 --     Minutes -- 15 --     METs -- 3.26 --       NuStep   Level -- 3 --     SPM -- 99 --     Minutes -- 15 --     METs -- 2.4 --       Home Exercise Plan   Plans to continue exercise at -- -- Home (comment)  walking, staff videos     Frequency -- -- Add 3 additional days to program exercise sessions.     Initial Home Exercises Provided -- -- 01/11/24              Exercise Comments:   Exercise Goals and Review:   Exercise Goals     Row Name 12/21/23 0917             Exercise Goals   Increase Physical Activity Yes       Intervention Provide advice, education, support and counseling about physical activity/exercise needs.;Develop an individualized exercise prescription for aerobic and resistive training based on initial evaluation findings, risk stratification, comorbidities and participant's personal goals.       Expected Outcomes Long Term: Add in home exercise to make exercise part of routine and to increase amount of physical activity.;Long Term: Exercising regularly at least 3-5 days a week.;Short Term: Attend rehab on a regular basis to increase amount of physical activity.       Increase Strength and Stamina Yes       Intervention Provide advice, education, support and counseling about physical activity/exercise needs.;Develop an individualized exercise prescription for aerobic and resistive training based on initial evaluation findings, risk stratification, comorbidities and participant's personal goals.       Expected Outcomes Short Term: Increase workloads from initial  exercise prescription for resistance, speed, and METs.;Short Term: Perform resistance training exercises routinely during rehab and add in resistance training at home;Long Term: Improve cardiorespiratory fitness, muscular endurance and strength as measured by increased METs and functional capacity ( )       Able to understand and use rate of perceived exertion (RPE) scale Yes       Intervention Provide education and explanation on how to use RPE scale       Expected Outcomes Short Term: Able to use RPE daily in rehab to express subjective intensity level;Long Term:  Able to use RPE to guide intensity level when exercising independently       Able to understand and use Dyspnea scale Yes       Intervention Provide education and explanation on how to use Dyspnea scale       Expected Outcomes Short Term: Able to use Dyspnea scale daily in rehab to express subjective sense of shortness of breath during exertion;Long Term: Able to use Dyspnea scale to guide intensity level when exercising independently  Knowledge and understanding of Target Heart Rate Range (THRR) Yes       Intervention Provide education and explanation of THRR including how the numbers were predicted and where they are located for reference       Expected Outcomes Short Term: Able to state/look up THRR;Long Term: Able to use THRR to govern intensity when exercising independently;Short Term: Able to use daily as guideline for intensity in rehab       Able to check pulse independently Yes       Intervention Provide education and demonstration on how to check pulse in carotid and radial arteries.;Review the importance of being able to check your own pulse for safety during independent exercise       Expected Outcomes Short Term: Able to explain why pulse checking is important during independent exercise;Long Term: Able to check pulse independently and accurately       Understanding of Exercise Prescription Yes       Intervention  Provide education, explanation, and written materials on patient's individual exercise prescription       Expected Outcomes Short Term: Able to explain program exercise prescription;Long Term: Able to explain home exercise prescription to exercise independently                Exercise Goals Re-Evaluation :  Exercise Goals Re-Evaluation     Row Name 01/03/24 1219 01/11/24 1055           Exercise Goal Re-Evaluation   Exercise Goals Review Increase Physical Activity;Increase Strength and Stamina;Understanding of Exercise Prescription Increase Physical Activity;Increase Strength and Stamina;Able to understand and use rate of perceived exertion (RPE) scale;Knowledge and understanding of Target Heart Rate Range (THRR);Able to understand and use Dyspnea scale;Able to check pulse independently;Understanding of Exercise Prescription      Comments [name] is doing well in rehab and is on his 6th visit. He has increased his speed on the treadmill to 2.5. He is execising at an RPE of 11 on both sets of equipment and is wanting to increase to push himsl;ef. Will continue to monitor and progress as able. [name] is doing well in rehab. He is feeling good and walking some around house and at store.  Reviewed home exercise with pt today.  Pt plans to walk at home for exercise.  We also talked about using staff videos on bad weather days. Reviewed THR, pulse, RPE, sign and symptoms, pulse oximetery and when to call 911 or MD.  Also discussed weather considerations and indoor options.  Pt voiced understanding.      Expected Outcomes Continue to come to reah Short: Start to add in more exercise at home on off days Long: Continue to improve stamina                Discharge Exercise Prescription (Final Exercise Prescription Changes):  Exercise Prescription Changes - 01/11/24 1000       Home Exercise Plan   Plans to continue exercise at Home (comment)   walking, staff videos   Frequency Add 3 additional  days to program exercise sessions.    Initial Home Exercises Provided 01/11/24             Nutrition:  Target Goals: Understanding of nutrition guidelines, daily intake of sodium 1500mg , cholesterol 200mg , calories 30% from fat and 7% or less from saturated fats, daily to have 5 or more servings of fruits and vegetables.  Biometrics:  Pre Biometrics - 12/21/23 0917       Pre Biometrics  Height 5\' 9"  (1.753 m)    Weight 203 lb 4.8 oz (92.2 kg)    Waist Circumference 39 inches    Hip Circumference 40 inches    Waist to Hip Ratio 0.98 %    BMI (Calculated) 30.01    Grip Strength 43.2 kg    Single Leg Stand 18.4 seconds              Nutrition Therapy Plan and Nutrition Goals:  Nutrition Therapy & Goals - 12/21/23 1526       Intervention Plan   Intervention Prescribe, educate and counsel regarding individualized specific dietary modifications aiming towards targeted core components such as weight, hypertension, lipid management, diabetes, heart failure and other comorbidities.;Nutrition handout(s) given to patient.    Expected Outcomes Short Term Goal: Understand basic principles of dietary content, such as calories, fat, sodium, cholesterol and nutrients.;Long Term Goal: Adherence to prescribed nutrition plan.             Nutrition Assessments:  MEDIFICTS Score Key: >=70 Need to make dietary changes  40-70 Heart Healthy Diet <= 40 Therapeutic Level Cholesterol Diet  Flowsheet Row CARDIAC REHAB PHASE II ORIENTATION from 12/21/2023 in [facility] CARDIAC REHABILITATION  Picture Your Plate Total Score on Admission 68      Picture Your Plate Scores: <45 Unhealthy dietary pattern with much room for improvement. 41-50 Dietary pattern unlikely to meet recommendations for good health and room for improvement. 51-60 More healthful dietary pattern, with some room for improvement.  >60 Healthy dietary pattern, although there may be some specific behaviors that could  be improved.    Nutrition Goals Re-Evaluation:  Nutrition Goals Re-Evaluation     Row Name 01/11/24 1102             Goals   Nutrition Goal Heart Healthy Diet       Comment [name] is doing well in rehab.  He has been working hard on his diet.  He is now eating more fruits and vegetables than he ever used to.  He is trying to stick to lean meat and staying away from red meat as much as possible.  He has never been a big salt person so that part has been easy for him.       Expected Outcome Short: Continue to focus on more fruits and vegetables Long: Continue to eat heart healthy                Nutrition Goals Discharge (Final Nutrition Goals Re-Evaluation):  Nutrition Goals Re-Evaluation - 01/11/24 1102       Goals   Nutrition Goal Heart Healthy Diet    Comment [name] is doing well in rehab.  He has been working hard on his diet.  He is now eating more fruits and vegetables than he ever used to.  He is trying to stick to lean meat and staying away from red meat as much as possible.  He has never been a big salt person so that part has been easy for him.    Expected Outcome Short: Continue to focus on more fruits and vegetables Long: Continue to eat heart healthy             Psychosocial: Target Goals: Acknowledge presence or absence of significant depression and/or stress, maximize coping skills, provide positive support system. Participant is able to verbalize types and ability to use techniques and skills needed for reducing stress and depression.  Initial Review & Psychosocial Screening:  Initial Psych Review &  Screening - 12/20/23 1143       Initial Review   Current issues with None Identified      Family Dynamics   Good Support System? Yes      Barriers   Psychosocial barriers to participate in program There are no identifiable barriers or psychosocial needs.;The patient should benefit from training in stress management and relaxation.      Screening  Interventions   Interventions Encouraged to exercise;To provide support and resources with identified psychosocial needs;Provide feedback about the scores to participant    Expected Outcomes Short Term goal: Utilizing psychosocial counselor, staff and physician to assist with identification of specific Stressors or current issues interfering with healing process. Setting desired goal for each stressor or current issue identified.;Long Term Goal: Stressors or current issues are controlled or eliminated.;Short Term goal: Identification and review with participant of any Quality of Life or Depression concerns found by scoring the questionnaire.;Long Term goal: The participant improves quality of Life and PHQ9 Scores as seen by post scores and/or verbalization of changes             Quality of Life Scores:  Quality of Life - 12/21/23 0918       Quality of Life   Select Quality of Life      Quality of Life Scores   Health/Function Pre 22.9 %    Socioeconomic Pre 28.63 %    Psych/Spiritual Pre 26.63 %    Family Pre 26.5 %    GLOBAL Pre 24.74 %            Scores of 19 and below usually indicate a poorer quality of life in these areas.  A difference of  2-3 points is a clinically meaningful difference.  A difference of 2-3 points in the total score of the Quality of Life Index has been associated with significant improvement in overall quality of life, self-image, physical symptoms, and general health in studies assessing change in quality of life.  PHQ-9: Review Flowsheet       12/21/2023 12/17/2023 05/25/2022  Depression screen PHQ 2/9  Decreased Interest 2 2 0 0  Down, Depressed, Hopeless 0 1 0 0  PHQ - 2 Score 2 3 0 0  Altered sleeping 1 0 -  Tired, decreased energy 2 2 -  Change in appetite 2 1 -  Feeling bad or failure about yourself  0 1 -  Trouble concentrating 1 1 -  Moving slowly or fidgety/restless 1 1 -  Suicidal thoughts 0 0 -  PHQ-9 Score 9 9 -  Difficult doing  work/chores Somewhat difficult Somewhat difficult -    Details       Multiple values from one day are sorted in reverse-chronological order        Interpretation of Total Score  Total Score Depression Severity:  1-4 = Minimal depression, 5-9 = Mild depression, 10-14 = Moderate depression, 15-19 = Moderately severe depression, 20-27 = Severe depression   Psychosocial Evaluation and Intervention:  Psychosocial Evaluation - 12/20/23 1143       Psychosocial Evaluation & Interventions   Interventions Stress management education;Relaxation education;Encouraged to exercise with the program and follow exercise prescription    Comments Patient was referred to CR with NSTEMI/Stent placement. He denies any depression, anxiety or stressors in his life. He says he was a little anxious following his MI but this has resolved. He has OSA and has used a CPAP machine up until 1 year ago and had inspire implanted and  this is working great for him. He sleeps well taking ambien. He has severe restless leg syndrome and takes cymbalta and gabapentin for this. He is getting over the flu and says he has some residual SOB and coughing. His wife is his main support person. He is retired as the [employer] for 23 years. He enjoys working with wood and makes furniture. He seems very motivated to participate in the program. His main goals are to improve his SOB and get his strength and stamina back and to get healthy overall. He has no barriers to complete the program identified.    Expected Outcomes Short Term: start the program and attend consistently. Long Term: complete the program meeting his personal goals.    Continue Psychosocial Services  Follow up required by staff             Psychosocial Re-Evaluation:  Psychosocial Re-Evaluation     Row Name 01/11/24 1057             Psychosocial Re-Evaluation   Current issues with Current Sleep Concerns       Comments [name] is doing well in  rehab.  He is generally a positive person and feeling good.  He feels like he is in a good place mentally.  He denies any major stressors other than his health, which he is working on.  He is sleeping decent.  He has good days and rough nights, but has been that way for a while.  He has tried a variety of techniques but has not had success.  He usually has an established bedtime routine and is regular.  They usually watch TV right up until bed, so we talked about trying to turn that off for a bit before bed.  I sent hm some sleep information for him to review.       Expected Outcomes Short: Review sleep information Long; Continue to exercise for mental boost       Interventions Encouraged to attend Cardiac Rehabilitation for the exercise       Continue Psychosocial Services  Follow up required by staff                Psychosocial Discharge (Final Psychosocial Re-Evaluation):  Psychosocial Re-Evaluation - 01/11/24 1057       Psychosocial Re-Evaluation   Current issues with Current Sleep Concerns    Comments [name] is doing well in rehab.  He is generally a positive person and feeling good.  He feels like he is in a good place mentally.  He denies any major stressors other than his health, which he is working on.  He is sleeping decent.  He has good days and rough nights, but has been that way for a while.  He has tried a variety of techniques but has not had success.  He usually has an established bedtime routine and is regular.  They usually watch TV right up until bed, so we talked about trying to turn that off for a bit before bed.  I sent hm some sleep information for him to review.    Expected Outcomes Short: Review sleep information Long; Continue to exercise for mental boost    Interventions Encouraged to attend Cardiac Rehabilitation for the exercise    Continue Psychosocial Services  Follow up required by staff             Vocational Rehabilitation: Provide vocational rehab  assistance to qualifying candidates.   Vocational Rehab Evaluation & Intervention:  Vocational  Rehab - 12/20/23 1143       Initial Vocational Rehab Evaluation & Intervention   Assessment shows need for Vocational Rehabilitation No      Vocational Rehab Re-Evaulation   Comments Patient is retired.             Education: Education Goals: Education classes will be provided on a weekly basis, covering required topics. Participant will state understanding/return demonstration of topics presented.  Learning Barriers/Preferences:  Learning Barriers/Preferences - 12/21/23 1610       Learning Barriers/Preferences   Learning Barriers Sight   glasses for reading   Learning Preferences Verbal Instruction;Skilled Demonstration             Education Topics: Hypertension, Hypertension Reduction -Define heart disease and high blood pressure. Discus how high blood pressure affects the body and ways to reduce high blood pressure.   Exercise and Your Heart -Discuss why it is important to exercise, the FITT principles of exercise, normal and abnormal responses to exercise, and how to exercise safely. Flowsheet Row CARDIAC REHAB PHASE II EXERCISE from 01/19/2024 in [place] CARDIAC REHABILITATION  Date 12/23/23  Educator HB  Instruction Review Code 1- Verbalizes Understanding       Angina -Discuss definition of angina, causes of angina, treatment of angina, and how to decrease risk of having angina.   Cardiac Medications -Review what the following cardiac medications are used for, how they affect the body, and side effects that may occur when taking the medications.  Medications include Aspirin, Beta blockers, calcium channel blockers, ACE Inhibitors, angiotensin receptor blockers, diuretics, digoxin, and antihyperlipidemics. Flowsheet Row CARDIAC REHAB PHASE II EXERCISE from 01/19/2024 in [place] CARDIAC REHABILITATION  Date 01/19/24  Educator hb  Instruction Review Code 1-  Verbalizes Understanding       Congestive Heart Failure -Discuss the definition of CHF, how to live with CHF, the signs and symptoms of CHF, and how keep track of weight and sodium intake. Flowsheet Row CARDIAC REHAB PHASE II EXERCISE from 01/19/2024 in [place] CARDIAC REHABILITATION  Date 01/13/24  Educator HB  Instruction Review Code 1- Verbalizes Understanding       Heart Disease and Intimacy -Discus the effect sexual activity has on the heart, how changes occur during intimacy as we age, and safety during sexual activity.   Smoking Cessation / COPD -Discuss different methods to quit smoking, the health benefits of quitting smoking, and the definition of COPD.   Nutrition I: Fats -Discuss the types of cholesterol, what cholesterol does to the heart, and how cholesterol levels can be controlled. Flowsheet Row CARDIAC REHAB PHASE II EXERCISE from 01/19/2024 in [place] CARDIAC REHABILITATION  Date 12/30/23  Educator HB  Instruction Review Code 1- Verbalizes Understanding       Nutrition II: Labels -Discuss the different components of food labels and how to read food label   Heart Parts/Heart Disease and PAD -Discuss the anatomy of the heart, the pathway of blood circulation through the heart, and these are affected by heart disease.   Stress I: Signs and Symptoms -Discuss the causes of stress, how stress may lead to anxiety and depression, and ways to limit stress. Flowsheet Row CARDIAC REHAB PHASE II EXERCISE from 01/19/2024 in [place] CARDIAC REHABILITATION  Date 01/06/24  Educator HB  Instruction Review Code 1- Verbalizes Understanding       Stress II: Relaxation -Discuss different types of relaxation techniques to limit stress.   Warning Signs of Stroke / TIA -Discuss definition of a  stroke, what the signs and symptoms are of a stroke, and how to identify when someone is having stroke.   Knowledge Questionnaire Score:  Knowledge Questionnaire  Score - 12/21/23 1914       Knowledge Questionnaire Score   Pre Score 18/26             Core Components/Risk Factors/Patient Goals at Admission:  Personal Goals and Risk Factors at Admission - 12/21/23 0922       Core Components/Risk Factors/Patient Goals on Admission    Weight Management Weight Maintenance;Yes;Obesity;Weight Loss    Intervention Weight Management: Develop a combined nutrition and exercise program designed to reach desired caloric intake, while maintaining appropriate intake of nutrient and fiber, sodium and fats, and appropriate energy expenditure required for the weight goal.;Weight Management: Provide education and appropriate resources to help participant work on and attain dietary goals.;Weight Management/Obesity: Establish reasonable short term and long term weight goals.;Obesity: Provide education and appropriate resources to help participant work on and attain dietary goals.    Admit Weight 203 lb 4.8 oz (92.2 kg)    Goal Weight: Short Term 198 lb (89.8 kg)    Goal Weight: Long Term 198 lb (89.8 kg)    Expected Outcomes Short Term: Continue to assess and modify interventions until short term weight is achieved;Long Term: Adherence to nutrition and physical activity/exercise program aimed toward attainment of established weight goal;Weight Maintenance: Understanding of the daily nutrition guidelines, which includes 25-35% calories from fat, 7% or less cal from saturated fats, less than 200mg  cholesterol, less than 1.5gm of sodium, & 5 or more servings of fruits and vegetables daily;Weight Loss: Understanding of general recommendations for a balanced deficit meal plan, which promotes 1-2 lb weight loss per week and includes a negative energy balance of [id number] kcal/d;Understanding recommendations for meals to include 15-35% energy as protein, 25-35% energy from fat, 35-60% energy from carbohydrates, less than 200mg  of dietary cholesterol, 20-35 gm of total fiber  daily;Understanding of distribution of calorie intake throughout the day with the consumption of 4-5 meals/snacks    Improve shortness of breath with ADL's Yes    Intervention Provide education, individualized exercise plan and daily activity instruction to help decrease symptoms of SOB with activities of daily living.    Expected Outcomes Short Term: Improve cardiorespiratory fitness to achieve a reduction of symptoms when performing ADLs;Long Term: Be able to perform more ADLs without symptoms or delay the onset of symptoms    Hypertension Yes    Intervention Provide education on lifestyle modifcations including regular physical activity/exercise, weight management, moderate sodium restriction and increased consumption of fresh fruit, vegetables, and low fat dairy, alcohol moderation, and smoking cessation.;Monitor prescription use compliance.    Expected Outcomes Short Term: Continued assessment and intervention until BP is < 140/32mm HG in hypertensive participants. < 130/29mm HG in hypertensive participants with diabetes, heart failure or chronic kidney disease.;Long Term: Maintenance of blood pressure at goal levels.    Lipids Yes    Intervention Provide education and support for participant on nutrition & aerobic/resistive exercise along with prescribed medications to achieve LDL 70mg , HDL >40mg .    Expected Outcomes Short Term: Participant states understanding of desired cholesterol values and is compliant with medications prescribed. Participant is following exercise prescription and nutrition guidelines.;Long Term: Cholesterol controlled with medications as prescribed, with individualized exercise RX and with personalized nutrition plan. Value goals: LDL < 70mg , HDL > 40 mg.             Core Components/Risk  Factors/Patient Goals Review:   Goals and Risk Factor Review     Row Name 01/11/24 1103             Core Components/Risk Factors/Patient Goals Review   Personal Goals Review  Weight Management/Obesity;Hypertension;Improve shortness of breath with ADL's;Lipids       Review [name] is doing well in rehab.  His weight has been steady since being discharged.  He lost a lot of weight in hospital from flu but has been able to maintain it.  He would like to continue to lose more.  He has lost 35 lbs since he retired from being more active.  He has noticed that his breathing has gotten better as he continues to recover from the flu.  His pressures are doing well and he has been checking them at home.       Expected Outcomes Short: Conitnue to work on weight loss Long: Continue to monitor risk factors.                Core Components/Risk Factors/Patient Goals at Discharge (Final Review):   Goals and Risk Factor Review - 01/11/24 1103       Core Components/Risk Factors/Patient Goals Review   Personal Goals Review Weight Management/Obesity;Hypertension;Improve shortness of breath with ADL's;Lipids    Review [name] is doing well in rehab.  His weight has been steady since being discharged.  He lost a lot of weight in hospital from flu but has been able to maintain it.  He would like to continue to lose more.  He has lost 35 lbs since he retired from being more active.  He has noticed that his breathing has gotten better as he continues to recover from the flu.  His pressures are doing well and he has been checking them at home.    Expected Outcomes Short: Conitnue to work on weight loss Long: Continue to monitor risk factors.             ITP Comments:  ITP Comments     Row Name 12/20/23 1153 12/21/23 0912 12/22/23 1003 01/19/24 1144     ITP Comments Virtual orientation visit for cardiac rehab with NSTEMI/Stent placement completed. On-site orientation visit scheduled for 12/21/23 at 8:30. Paperwork sent through [employer]. Patient says he will completed prior to arrival. Patient attend orientation today.  Patient is attending Cardiac Rehabilitation Program.  Documentation for  diagnosis can be found in [facility] 12/03/23.  Reviewed medical chart, RPE/RPD, gym safety, and program guidelines.  Patient was fitted to equipment they will be using during rehab.  Patient is scheduled to start exercise on Thursday 12/23/23 at 1030.   Initial ITP created and sent for review and signature by Dr. Dina Rich, Medical Director for Cardiac Rehabilitation Program. 30 day review completed. ITP sent to Dr. Dina Rich, Medical Director of Cardiac Rehab. Continue with ITP unless changes are made by physician.  Pt was oriented yesterday. 30 day review completed. ITP sent to Dr. Dina Rich, Medical Director of Cardiac Rehab. Continue with ITP unless changes are made by physician.             Comments: 30 day review

## 2024-01-19 NOTE — Progress Notes (Signed)
 Daily Session Note  Patient Details  Name: ZIYAN SCHOON MRN: [id number] Date of Birth: [birth year] Referring Provider:   Flowsheet Row CARDIAC REHAB PHASE II ORIENTATION from 12/21/2023 in [facility] CARDIAC REHABILITATION  Referring Provider Candelaria Stagers MD  [facility] Cardiologist: Dr. Onalee Hua Harding]       Encounter Date: 01/19/2024  Check In:  Session Check In - 01/19/24 0915       Check-In   Supervising physician immediately available to respond to emergencies See telemetry face sheet for immediately available MD    Location [facility]    Staff Present Ross Ludwig, BS, Exercise Physiologist;Hillary Leonidas Romberg BSN, RN    Virtual Visit No    Medication changes reported     No    Fall or balance concerns reported    No    Tobacco Cessation No Change    Warm-up and Cool-down Performed on first and last piece of equipment    Resistance Training Performed Yes    VAD Patient? No    PAD/SET Patient? No      Pain Assessment   Currently in Pain? No/denies    Pain Score 0-No pain    Multiple Pain Sites No             Capillary Blood Glucose: No results found for this or any previous visit (from the past 24 hours).    Social History   Tobacco Use  Smoking Status Former   Types: Cigars   Passive exposure: Never  Smokeless Tobacco Never    Goals Met:  Independence with exercise equipment Exercise tolerated well No report of concerns or symptoms today Strength training completed today  Goals Unmet:  Not Applicable  Comments: Pt able to follow exercise prescription today without complaint.  Will continue to monitor for progression.

## 2024-01-20 ENCOUNTER — Encounter (HOSPITAL_COMMUNITY)
Admission: RE | Admit: 2024-01-20 | Discharge: 2024-01-20 | Disposition: A | Discharge status: Home / Self Care | Payer: Medicare Other | Source: Ambulatory Visit | Attending: Cardiology | Admitting: Cardiology

## 2024-01-20 DIAGNOSIS — I214 Non-ST elevation (NSTEMI) myocardial infarction: Secondary | ICD-10-CM | POA: Diagnosis not present

## 2024-01-20 DIAGNOSIS — Z955 Presence of coronary angioplasty implant and graft: Secondary | ICD-10-CM

## 2024-01-20 NOTE — Addendum Note (Signed)
 Addended by: Micael Hampshire on: 01/20/2024 07:35 AM   Modules accepted: Orders

## 2024-01-20 NOTE — Progress Notes (Signed)
 Daily Session Note  Patient Details  Name: KAVON VALENZA MRN: [id number] Date of Birth: 1951/02/02 Referring Provider:   Flowsheet Row CARDIAC REHAB PHASE II ORIENTATION from 12/21/2023 in [facility] CARDIAC REHABILITATION  Referring Provider Candelaria Stagers MD  [facility] Cardiologist: Dr. Onalee Hua Harding]       Encounter Date: 01/20/2024  Check In:  Session Check In - 01/20/24 1015       Check-In   Location [facility]    Staff Present Ross Ludwig, BS, Exercise Physiologist;Tedric Leeth Laural Benes, RN, BSN;Jessica Hawkins, MA, RCEP, CCRP, CCET    Virtual Visit No    Medication changes reported     No    Fall or balance concerns reported    No    Warm-up and Cool-down Performed on first and last piece of equipment    Resistance Training Performed Yes    VAD Patient? No    PAD/SET Patient? No      Pain Assessment   Currently in Pain? No/denies    Pain Score 0-No pain    Multiple Pain Sites No             Capillary Blood Glucose: No results found for this or any previous visit (from the past 24 hours).    Social History   Tobacco Use  Smoking Status Former   Types: Cigars   Passive exposure: Never  Smokeless Tobacco Never    Goals Met:  Independence with exercise equipment Exercise tolerated well No report of concerns or symptoms today Strength training completed today  Goals Unmet:  Not Applicable  Comments: Pt able to follow exercise prescription today without complaint.  Will continue to monitor for progression.

## 2024-01-25 ENCOUNTER — Telehealth: Payer: Self-pay

## 2024-01-25 ENCOUNTER — Encounter (HOSPITAL_COMMUNITY)
Admission: RE | Admit: 2024-01-25 | Discharge: 2024-01-25 | Disposition: A | Discharge status: Home / Self Care | Payer: Medicare Other | Source: Ambulatory Visit | Attending: Cardiology | Admitting: Cardiology

## 2024-01-25 DIAGNOSIS — I214 Non-ST elevation (NSTEMI) myocardial infarction: Secondary | ICD-10-CM | POA: Diagnosis not present

## 2024-01-25 DIAGNOSIS — Z955 Presence of coronary angioplasty implant and graft: Secondary | ICD-10-CM | POA: Diagnosis not present

## 2024-01-25 NOTE — Progress Notes (Signed)
 Daily Session Note  Patient Details  Name: [name] MRN: [id number] Date of Birth: 02-06-1951 Referring Provider:   Flowsheet Row CARDIAC REHAB PHASE II ORIENTATION from 12/21/2023 in [facility] CARDIAC REHABILITATION  Referring Provider Candelaria Stagers MD  [facility] Cardiologist: Dr. Onalee Hua Harding]       Encounter Date: 01/25/2024  Check In:  Session Check In - 01/25/24 1021       Check-In   Supervising physician immediately available to respond to emergencies See telemetry face sheet for immediately available MD    Location [facility]    Staff Present Sherrye Payor, RN;Heather Fredric Mare, BS, Exercise Physiologist;Jessica [place], MA, RCEP, CCRP, CCET;Shalese Strahan [place], BSN, RN, WTA-C    Virtual Visit No    Medication changes reported     No    Fall or balance concerns reported    No    Tobacco Cessation No Change    Warm-up and Cool-Cuevas Performed on first and last piece of equipment    Resistance Training Performed Yes    VAD Patient? No    PAD/SET Patient? No      Pain Assessment   Currently in Pain? No/denies             Capillary Blood Glucose: No results found for this or any previous visit (from the past 24 hours).    Social History   Tobacco Use  Smoking Status Former   Types: Cigars   Passive exposure: Never  Smokeless Tobacco Never    Goals Met:  Independence with exercise equipment No report of concerns or symptoms today Strength training completed today  Goals Unmet:  Not Applicable  Comments: Pt able to follow exercise prescription today without complaint.  Will continue to monitor for progression.

## 2024-01-25 NOTE — Telephone Encounter (Signed)
 Awaiting dr Durwin Nora signature on fax

## 2024-01-25 NOTE — Telephone Encounter (Signed)
 Copied from CRM [id number]. Topic: Clinical - Prescription Issue >> Jan 25, 2024  8:56 AM [name] wrote: Reason for CRM: The patient's wife, [name], advised that the Pharmacy is requesting verbal approval for a replacement for Evolocumab (REPATHA SURECLICK) 140 MG/ML SOA. Patient spoke to company and replacement will be given to him at no cost as patient received a damaged pen. Please call her back at [phone]. The number for the pharmacy is [id number] and the reference number is [id number].

## 2024-01-27 ENCOUNTER — Encounter (HOSPITAL_COMMUNITY)
Admission: RE | Admit: 2024-01-27 | Discharge: 2024-01-27 | Disposition: A | Discharge status: Home / Self Care | Payer: Medicare Other | Source: Ambulatory Visit | Attending: Cardiology

## 2024-01-27 DIAGNOSIS — I214 Non-ST elevation (NSTEMI) myocardial infarction: Secondary | ICD-10-CM

## 2024-01-27 DIAGNOSIS — Z955 Presence of coronary angioplasty implant and graft: Secondary | ICD-10-CM

## 2024-01-27 NOTE — Progress Notes (Signed)
 Daily Session Note  Patient Details  Name: FLYNT BREEZE MRN: [id number] Date of Birth: 10-28-1950 Referring Provider:   Flowsheet Row CARDIAC REHAB PHASE II ORIENTATION from 12/21/2023 in [facility] CARDIAC REHABILITATION  Referring Provider Candelaria Stagers MD  [facility] Cardiologist: Dr. Onalee Hua Harding]       Encounter Date: 01/27/2024  Check In:  Session Check In - 01/27/24 1015       Check-In   Supervising physician immediately available to respond to emergencies See telemetry face sheet for immediately available MD    Location [facility]    Staff Present Ross Ludwig, BS, Exercise Physiologist;Zyiah [place]ington Jake Shark, Margarite Gouge, RN, BSN    Virtual Visit No    Fall or balance concerns reported    No    Resistance Training Performed Yes    VAD Patient? No    PAD/SET Patient? No      Pain Assessment   Currently in Pain? No/denies    Pain Score 0-No pain    Multiple Pain Sites No             Capillary Blood Glucose: No results found for this or any previous visit (from the past 24 hours).    Social History   Tobacco Use  Smoking Status Former   Types: Cigars   Passive exposure: Never  Smokeless Tobacco Never    Goals Met:  Independence with exercise equipment Exercise tolerated well No report of concerns or symptoms today Strength training completed today  Goals Unmet:  Not Applicable  Comments: Pt able to follow exercise prescription today without complaint.  Will continue to monitor for progression.

## 2024-01-30 ENCOUNTER — Other Ambulatory Visit: Payer: Self-pay | Admitting: Internal Medicine

## 2024-01-30 DIAGNOSIS — G4700 Insomnia, unspecified: Secondary | ICD-10-CM

## 2024-01-31 ENCOUNTER — Other Ambulatory Visit: Payer: Self-pay

## 2024-01-31 ENCOUNTER — Other Ambulatory Visit: Payer: Self-pay | Admitting: Internal Medicine

## 2024-01-31 DIAGNOSIS — G4700 Insomnia, unspecified: Secondary | ICD-10-CM

## 2024-01-31 MED ORDER — ZOLPIDEM TARTRATE 10 MG PO TABS
10.0000 mg | ORAL_TABLET | Freq: Every day | ORAL | 0 refills | Status: DC
Start: 2024-01-31 — End: 2024-01-31
  Filled 2024-01-31: qty 30, 30d supply, fill #0

## 2024-01-31 MED ORDER — ZOLPIDEM TARTRATE 10 MG PO TABS: 10.0000 mg | ORAL_TABLET | Freq: Every day | ORAL | 0 refills | Status: DC

## 2024-02-01 ENCOUNTER — Encounter (HOSPITAL_COMMUNITY)
Admission: RE | Admit: 2024-02-01 | Discharge: 2024-02-01 | Disposition: A | Discharge status: Home / Self Care | Payer: Medicare Other | Source: Ambulatory Visit | Attending: Cardiology | Admitting: Cardiology

## 2024-02-01 DIAGNOSIS — Z955 Presence of coronary angioplasty implant and graft: Secondary | ICD-10-CM

## 2024-02-01 DIAGNOSIS — I214 Non-ST elevation (NSTEMI) myocardial infarction: Secondary | ICD-10-CM | POA: Diagnosis not present

## 2024-02-01 NOTE — Progress Notes (Signed)
 Daily Session Note  Patient Details  Name: [name] MRN: [id number] Date of Birth: 14-Nov-1950 Referring Provider:   Flowsheet Row CARDIAC REHAB PHASE II ORIENTATION from 12/21/2023 in [facility] CARDIAC REHABILITATION  Referring Provider Candelaria Stagers MD  [facility] Cardiologist: Dr. Onalee Hua Harding]       Encounter Date: 02/01/2024  Check In:  Session Check In - 02/01/24 1048       Check-In   Supervising physician immediately available to respond to emergencies See telemetry face sheet for immediately available MD    Location [facility]    Staff Present Desiree Lucy, BSN, RN, Sherlyn Hay, MA, RCEP, CCRP, CCET    Virtual Visit No    Medication changes reported     No    Fall or balance concerns reported    No    Tobacco Cessation No Change    Warm-up and Cool-down Performed on first and last piece of equipment    Resistance Training Performed Yes    VAD Patient? No    PAD/SET Patient? No      Pain Assessment   Currently in Pain? No/denies    Pain Score 0-No pain             Capillary Blood Glucose: No results found for this or any previous visit (from the past 24 hours).    Social History   Tobacco Use  Smoking Status Former   Types: Cigars   Passive exposure: Never  Smokeless Tobacco Never    Goals Met:  Independence with exercise equipment No report of concerns or symptoms today Strength training completed today  Goals Unmet:  Not Applicable  Comments: Pt able to follow exercise prescription today without complaint.  Will continue to monitor for progression.

## 2024-02-03 ENCOUNTER — Encounter (HOSPITAL_COMMUNITY)
Admission: RE | Admit: 2024-02-03 | Discharge: 2024-02-03 | Disposition: A | Discharge status: Home / Self Care | Payer: Medicare Other | Source: Ambulatory Visit | Attending: Cardiology

## 2024-02-03 DIAGNOSIS — I214 Non-ST elevation (NSTEMI) myocardial infarction: Secondary | ICD-10-CM | POA: Diagnosis not present

## 2024-02-03 DIAGNOSIS — Z955 Presence of coronary angioplasty implant and graft: Secondary | ICD-10-CM

## 2024-02-03 NOTE — Progress Notes (Signed)
 Daily Session Note  Patient Details  Name: [name] MRN: [id number] Date of Birth: [birth year] Referring Provider:   Flowsheet Row CARDIAC REHAB PHASE II ORIENTATION from 12/21/2023 in [facility] CARDIAC REHABILITATION  Referring Provider Candelaria Stagers MD  [facility] Cardiologist: Dr. Onalee Hua Harding]       Encounter Date: 02/03/2024  Check In:  Session Check In - 02/03/24 1015       Check-In   Supervising physician immediately available to respond to emergencies See telemetry face sheet for immediately available MD    Location [facility]    Staff Present Rodena Medin, RN, BSN;Heather Fredric Mare, BS, Exercise Physiologist    Virtual Visit No    Medication changes reported     No    Fall or balance concerns reported    No    Warm-up and Cool-down Performed on first and last piece of equipment    Resistance Training Performed Yes    VAD Patient? No    PAD/SET Patient? No      Pain Assessment   Currently in Pain? No/denies    Pain Score 0-No pain    Multiple Pain Sites No             Capillary Blood Glucose: No results found for this or any previous visit (from the past 24 hours).    Social History   Tobacco Use  Smoking Status Former   Types: Cigars   Passive exposure: Never  Smokeless Tobacco Never    Goals Met:  Independence with exercise equipment Exercise tolerated well No report of concerns or symptoms today Strength training completed today  Goals Unmet:  Not Applicable  Comments: Pt able to follow exercise prescription today without complaint.  Will continue to monitor for progression.

## 2024-02-04 ENCOUNTER — Ambulatory Visit: Payer: Medicare Other | Admitting: Internal Medicine

## 2024-02-05 ENCOUNTER — Other Ambulatory Visit: Payer: Self-pay | Admitting: Internal Medicine

## 2024-02-05 DIAGNOSIS — F321 Major depressive disorder, single episode, moderate: Secondary | ICD-10-CM

## 2024-02-08 ENCOUNTER — Encounter (HOSPITAL_COMMUNITY)
Admission: RE | Admit: 2024-02-08 | Discharge: 2024-02-08 | Disposition: A | Discharge status: Home / Self Care | Payer: Medicare Other | Source: Ambulatory Visit | Attending: Cardiology | Admitting: Cardiology

## 2024-02-08 DIAGNOSIS — I214 Non-ST elevation (NSTEMI) myocardial infarction: Secondary | ICD-10-CM

## 2024-02-08 DIAGNOSIS — Z955 Presence of coronary angioplasty implant and graft: Secondary | ICD-10-CM | POA: Diagnosis not present

## 2024-02-08 NOTE — Progress Notes (Signed)
 Daily Session Note  Patient Details  Name: [name] MRN: [id number] Date of Birth: 1951/04/11 Referring Provider:   Flowsheet Row CARDIAC REHAB PHASE II ORIENTATION from 12/21/2023 in [facility] CARDIAC REHABILITATION  Referring Provider Carry Clapper MD  [facility] Cardiologist: Dr. Myrtie Atkinson Harding]       Encounter Date: 02/08/2024  Check In:  Session Check In - 02/08/24 1031       Check-In   Supervising physician immediately available to respond to emergencies See telemetry face sheet for immediately available MD    Location [facility]    Staff Present Clotilda Danish, BS, Exercise Physiologist;Laureen Bevin Bucks, BS, RRT, CPFT;Brittany Foley, BSN, RN    Virtual Visit No    Medication changes reported     No    Fall or balance concerns reported    No    Tobacco Cessation No Change    Warm-up and Cool-down Performed on first and last piece of equipment    Resistance Training Performed Yes    VAD Patient? No    PAD/SET Patient? No      Pain Assessment   Currently in Pain? No/denies    Pain Score 0-No pain    Multiple Pain Sites No             Capillary Blood Glucose: No results found for this or any previous visit (from the past 24 hours).    Social History   Tobacco Use  Smoking Status Former   Types: Cigars   Passive exposure: Never  Smokeless Tobacco Never    Goals Met:  Independence with exercise equipment Exercise tolerated well No report of concerns or symptoms today Strength training completed today  Goals Unmet:  Not Applicable  Comments: Pt able to follow exercise prescription today without complaint.  Will continue to monitor for progression.

## 2024-02-10 ENCOUNTER — Encounter (HOSPITAL_COMMUNITY): Payer: Medicare Other

## 2024-02-10 DIAGNOSIS — E785 Hyperlipidemia, unspecified: Secondary | ICD-10-CM | POA: Diagnosis not present

## 2024-02-11 LAB — LIPID PANEL
Chol/HDL Ratio: 2.2 ratio (ref 0.0–5.0)
Cholesterol, Total: 112 mg/dL (ref 100–199)
HDL: 51 mg/dL (ref >39)
LDL Chol Calc (NIH): 45 mg/dL (ref 0–99)
Triglycerides: 77 mg/dL (ref 0–149)
VLDL Cholesterol Cal: 16 mg/dL (ref 5–40)

## 2024-02-15 ENCOUNTER — Encounter (HOSPITAL_COMMUNITY)
Admission: RE | Admit: 2024-02-15 | Discharge: 2024-02-15 | Disposition: A | Discharge status: Home / Self Care | Payer: Medicare Other | Source: Ambulatory Visit | Attending: Cardiology | Admitting: Cardiology

## 2024-02-15 DIAGNOSIS — I214 Non-ST elevation (NSTEMI) myocardial infarction: Secondary | ICD-10-CM

## 2024-02-15 DIAGNOSIS — Z955 Presence of coronary angioplasty implant and graft: Secondary | ICD-10-CM | POA: Diagnosis not present

## 2024-02-15 NOTE — Progress Notes (Signed)
 Daily Session Note  Patient Details  Name: [name] MRN: [id number] Date of Birth: May 16, 1951 Referring Provider:   Flowsheet Row CARDIAC REHAB PHASE II ORIENTATION from 12/21/2023 in [facility] CARDIAC REHABILITATION  Referring Provider Carry Clapper MD  [facility] Cardiologist: Dr. Myrtie Atkinson Harding]       Encounter Date: 02/15/2024  Check In:  Session Check In - 02/15/24 1028       Check-In   Supervising physician immediately available to respond to emergencies See telemetry face sheet for immediately available MD    Location [facility]    Staff Present Clotilda Danish, BS, Exercise Physiologist;Jessica Zoila Hines, MA, RCEP, CCRP, CCET;Brittany Foley, BSN, RN    Virtual Visit No    Medication changes reported     No    Fall or balance concerns reported    No    Tobacco Cessation No Change    Warm-up and Cool-down Performed on first and last piece of equipment    Resistance Training Performed Yes    VAD Patient? No    PAD/SET Patient? No      Pain Assessment   Currently in Pain? No/denies    Pain Score 0-No pain    Multiple Pain Sites No             Capillary Blood Glucose: No results found for this or any previous visit (from the past 24 hours).    Social History   Tobacco Use  Smoking Status Former   Types: Cigars   Passive exposure: Never  Smokeless Tobacco Never    Goals Met:  Independence with exercise equipment Exercise tolerated well No report of concerns or symptoms today Strength training completed today  Goals Unmet:  Not Applicable  Comments: Pt able to follow exercise prescription today without complaint.  Will continue to monitor for progression.

## 2024-02-16 ENCOUNTER — Encounter (HOSPITAL_COMMUNITY): Payer: Self-pay | Admitting: *Deleted

## 2024-02-16 DIAGNOSIS — M25562 Pain in left knee: Secondary | ICD-10-CM | POA: Diagnosis not present

## 2024-02-16 DIAGNOSIS — Z96652 Presence of left artificial knee joint: Secondary | ICD-10-CM | POA: Diagnosis not present

## 2024-02-16 DIAGNOSIS — Z955 Presence of coronary angioplasty implant and graft: Secondary | ICD-10-CM

## 2024-02-16 DIAGNOSIS — I214 Non-ST elevation (NSTEMI) myocardial infarction: Secondary | ICD-10-CM

## 2024-02-16 DIAGNOSIS — M1711 Unilateral primary osteoarthritis, right knee: Secondary | ICD-10-CM | POA: Diagnosis not present

## 2024-02-16 NOTE — Progress Notes (Signed)
 Cardiac Individual Treatment Plan  Patient Details  Name: [name] MRN: [id number] Date of Birth: 1950/12/27 Referring Provider:   Flowsheet Row CARDIAC REHAB PHASE II ORIENTATION from 12/21/2023 in [facility] CARDIAC REHABILITATION  Referring Provider Carry Clapper MD  [facility] Cardiologist: Dr. Myrtie Atkinson [place]]       Initial Encounter Date:  Flowsheet Row CARDIAC REHAB PHASE II ORIENTATION from 12/21/2023 in [place] CARDIAC REHABILITATION  Date 12/21/23       Visit Diagnosis: NSTEMI (non-ST elevated myocardial infarction) [facility])  Status post coronary artery stent placement  Patient's Home Medications on Admission:  Current Outpatient Medications:    aspirin  EC 81 MG tablet, Take 1 tablet (81 mg total) by mouth daily. Swallow whole., Disp: , Rfl:    DULoxetine  (CYMBALTA ) 30 MG capsule, TAKE TWO CAPSULES BY MOUTH EVERY DAY, Disp: 60 capsule, Rfl: 2   Evolocumab  (REPATHA  SURECLICK) 140 MG/ML SOAJ, Inject 140 mg into the skin every 14 (fourteen) days., Disp: 2 mL, Rfl: 2   gabapentin  (NEURONTIN ) 300 MG capsule, Take 300 mg by mouth See admin instructions. One tablet in the morning and afternoon and two tablets at bedtime, Disp: , Rfl:    metoprolol  succinate (TOPROL -XL) 25 MG 24 hr tablet, Take 0.5 tablets (12.5 mg total) by mouth daily., Disp: 45 tablet, Rfl: 0   nitroGLYCERIN  (NITROSTAT ) 0.4 MG SL tablet, Place 1 tablet (0.4 mg total) under the tongue every 5 (five) minutes as needed for chest pain., Disp: 25 tablet, Rfl: 11   pramipexole  (MIRAPEX ) 0.5 MG tablet, TAKE 1 TABLET AT DINNER AND 1 TABLET AT BEDTIME., Disp: 180 tablet, Rfl: 0   prasugrel  (EFFIENT ) 10 MG TABS tablet, Take 1 tablet (10 mg total) by mouth daily., Disp: 180 tablet, Rfl: 0   zolpidem  (AMBIEN ) 10 MG tablet, Take 1 tablet (10 mg total) by mouth at bedtime., Disp: 30 tablet, Rfl: 0  Past Medical History: Past Medical History:  Diagnosis Date   Anemia    Anxiety    Arthritis    spinal stenosis    Depression    History of kidney stones    Hyperlipidemia    Hypertension    Kidney function abnormal 1983   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   Sleep apnea    uses Cpap nightly    Tobacco Use: Social History   Tobacco Use  Smoking Status Former   Types: Cigars   Passive exposure: Never  Smokeless Tobacco Never    Labs: Review Flowsheet       Latest Ref Rng & Units 05/14/2017 12/05/2023 02/10/2024  Labs for ITP Cardiac and Pulmonary Rehab  Cholestrol 100 - 199 mg/dL 696  295  284   LDL (calc) 0 - 99 mg/dL 132  440  45   HDL-C >10 mg/dL 29  38  51   Trlycerides 0 - 149 mg/dL 272  58  77   Hemoglobin A1c 4.8 - 5.6 % - 5.4  -    Capillary Blood Glucose: No results found for: "GLUCAP"   Exercise Target Goals: Exercise Program Goal: Individual exercise prescription set using results from initial 6 min walk test and THRR while considering  patient's activity barriers and safety.   Exercise Prescription Goal: Starting with aerobic activity 30 plus minutes a day, 3 days per week for initial exercise prescription. Provide home exercise prescription and guidelines that participant acknowledges understanding prior to discharge.  Activity Barriers & Risk Stratification:  Activity Barriers &  Cardiac Risk Stratification - 12/21/23 0913       Activity Barriers & Cardiac Risk Stratification   Activity Barriers Left Knee Replacement;Deconditioning;Shortness of Breath;Balance Concerns;Arthritis;Neck/Spine Problems;Joint Problems;Muscular Weakness   hx spinal stenosis, arthritis all over, bilateral knee surgeries (occassional pain)   Cardiac Risk Stratification Moderate             6 Minute Walk:  6 Minute Walk     Row Name 12/21/23 0913         6 Minute Walk   Phase Initial     Distance 1325 feet     Walk Time 6 minutes     # of Rest Breaks 0     MPH 2.51     METS 2.76     RPE 7     VO2 Peak 9.67     Symptoms No      Resting HR 78 bpm     Resting BP 104/62     Resting Oxygen Saturation  97 %     Exercise Oxygen Saturation  during 6 min walk 98 %     Max Ex. HR 103 bpm     Max Ex. BP 148/66     2 Minute Post BP 114/62              Oxygen Initial Assessment:   Oxygen Re-Evaluation:   Oxygen Discharge (Final Oxygen Re-Evaluation):   Initial Exercise Prescription:  Initial Exercise Prescription - 12/21/23 0900       Date of Initial Exercise RX and Referring Provider   Date 12/21/23    Referring Provider Carry Clapper MD   Primary Cardiologist: Dr. Randene Bustard     Oxygen   Maintain Oxygen Saturation 88% or higher      Treadmill   MPH 2.2    Grade 0.5    Minutes 15    METs 2.84      NuStep   Level 3    SPM 80    Minutes 15    METs 2.5      Prescription Details   Frequency (times per week) 2    Duration Progress to 30 minutes of continuous aerobic without signs/symptoms of physical distress      Intensity   THRR 40-80% of Max Heartrate 105-132    Ratings of Perceived Exertion 11-13    Perceived Dyspnea 0-4      Progression   Progression Continue to progress workloads to maintain intensity without signs/symptoms of physical distress.      Resistance Training   Training Prescription Yes    Weight 5 lb    Reps 10-15             Perform Capillary Blood Glucose checks as needed.  Exercise Prescription Changes:   Exercise Prescription Changes     Row Name 12/21/23 0900 12/30/23 1200 01/11/24 1000 02/01/24 1500       Response to Exercise   Blood Pressure (Admit) 104/62 110/66 -- 110/70    Blood Pressure (Exercise) 148/66 146/72 -- --    Blood Pressure (Exit) 114/62 118/60 -- 122/78    Heart Rate (Admit) 78 bpm 76 bpm -- 85 bpm    Heart Rate (Exercise) 103 bpm 117 bpm -- 121 bpm    Heart Rate (Exit) 80 bpm 88 bpm -- 93 bpm    Oxygen Saturation (Admit) 97 % -- -- --    Oxygen Saturation (Exercise) 98 % -- -- --    Rating of Perceived Exertion (Exercise)  7 11 -- 13    Symptoms none -- -- --    Comments walk test results -- -- --    Duration -- Continue with 30 min of aerobic exercise without signs/symptoms of physical distress. -- Continue with 30 min of aerobic exercise without signs/symptoms of physical distress.    Intensity -- THRR unchanged -- THRR unchanged      Progression   Progression -- Continue to progress workloads to maintain intensity without signs/symptoms of physical distress. -- Continue to progress workloads to maintain intensity without signs/symptoms of physical distress.      Resistance Training   Training Prescription -- Yes -- Yes    Weight -- 5 -- 5    Reps -- 10-15 -- 10-15      Treadmill   MPH -- 2.5 -- 3    Grade -- 1 -- 2    Minutes -- 15 -- 15    METs -- 3.26 -- 4.12      NuStep   Level -- 3 -- 3    SPM -- 99 -- 114    Minutes -- 15 -- 15    METs -- 2.4 -- 3.4      Home Exercise Plan   Plans to continue exercise at -- -- Home (comment)  walking, staff videos Home (comment)    Frequency -- -- Add 3 additional days to program exercise sessions. Add 3 additional days to program exercise sessions.    Initial Home Exercises Provided -- -- 01/11/24 --             Exercise Comments:   Exercise Goals and Review:   Exercise Goals     Row Name 12/21/23 0917             Exercise Goals   Increase Physical Activity Yes       Intervention Provide advice, education, support and counseling about physical activity/exercise needs.;Develop an individualized exercise prescription for aerobic and resistive training based on initial evaluation findings, risk stratification, comorbidities and participant's personal goals.       Expected Outcomes Long Term: Add in home exercise to make exercise part of routine and to increase amount of physical activity.;Long Term: Exercising regularly at least 3-5 days a week.;Short Term: Attend rehab on a regular basis to increase amount of physical activity.        Increase Strength and Stamina Yes       Intervention Provide advice, education, support and counseling about physical activity/exercise needs.;Develop an individualized exercise prescription for aerobic and resistive training based on initial evaluation findings, risk stratification, comorbidities and participant's personal goals.       Expected Outcomes Short Term: Increase workloads from initial exercise prescription for resistance, speed, and METs.;Short Term: Perform resistance training exercises routinely during rehab and add in resistance training at home;Long Term: Improve cardiorespiratory fitness, muscular endurance and strength as measured by increased METs and functional capacity ( )       Able to understand and use rate of perceived exertion (RPE) scale Yes       Intervention Provide education and explanation on how to use RPE scale       Expected Outcomes Short Term: Able to use RPE daily in rehab to express subjective intensity level;Long Term:  Able to use RPE to guide intensity level when exercising independently       Able to understand and use Dyspnea scale Yes       Intervention Provide education and explanation on how to  use Dyspnea scale       Expected Outcomes Short Term: Able to use Dyspnea scale daily in rehab to express subjective sense of shortness of breath during exertion;Long Term: Able to use Dyspnea scale to guide intensity level when exercising independently       Knowledge and understanding of Target Heart Rate Range (THRR) Yes       Intervention Provide education and explanation of THRR including how the numbers were predicted and where they are located for reference       Expected Outcomes Short Term: Able to state/look up THRR;Long Term: Able to use THRR to govern intensity when exercising independently;Short Term: Able to use daily as guideline for intensity in rehab       Able to check pulse independently Yes       Intervention Provide education and demonstration  on how to check pulse in carotid and radial arteries.;Review the importance of being able to check your own pulse for safety during independent exercise       Expected Outcomes Short Term: Able to explain why pulse checking is important during independent exercise;Long Term: Able to check pulse independently and accurately       Understanding of Exercise Prescription Yes       Intervention Provide education, explanation, and written materials on patient's individual exercise prescription       Expected Outcomes Short Term: Able to explain program exercise prescription;Long Term: Able to explain home exercise prescription to exercise independently                Exercise Goals Re-Evaluation :  Exercise Goals Re-Evaluation     Row Name 01/03/24 1219 01/11/24 1055 01/25/24 1036 02/02/24 1318       Exercise Goal Re-Evaluation   Exercise Goals Review Increase Physical Activity;Increase Strength and Stamina;Understanding of Exercise Prescription Increase Physical Activity;Increase Strength and Stamina;Able to understand and use rate of perceived exertion (RPE) scale;Knowledge and understanding of Target Heart Rate Range (THRR);Able to understand and use Dyspnea scale;Able to check pulse independently;Understanding of Exercise Prescription Increase Physical Activity;Increase Strength and Stamina;Understanding of Exercise Prescription --    Comments [name] is doing well in rehab and is on his 6th visit. He has increased his speed on the treadmill to 2.5. He is execising at an RPE of 11 on both sets of equipment and is wanting to increase to push himsl;ef. Will continue to monitor and progress as able. [name] is doing well in rehab. He is feeling good and walking some around house and at store.  Reviewed home exercise with pt today.  Pt plans to walk at home for exercise.  We also talked about using staff videos on bad weather days. Reviewed THR, pulse, RPE, sign and symptoms, pulse oximetery and when to  call 911 or MD.  Also discussed weather considerations and indoor options.  Pt voiced understanding. [name] is doing great with exercise. He continues to feel good and is doing yard work around [employer]. He is doing his wood working as well. He doesnt get as wrn out as he use to. [name] is doing well in rehab and is on his 19th visit. He is increasing his speed on the treadmill and level on the nustep. He is at a walking speed of 3 mph with a grade of 2 and level 3 on the nustoe Will continue to monitor and progress as able    Expected Outcomes Continue to come to reah Short: Start to add in more exercise  at home on off days Long: Continue to improve stamina Short: Start to add in more exercise at home on off days Long: Continue to improve stamina continue to attend rehab              Discharge Exercise Prescription (Final Exercise Prescription Changes):  Exercise Prescription Changes - 02/01/24 1500       Response to Exercise   Blood Pressure (Admit) 110/70    Blood Pressure (Exit) 122/78    Heart Rate (Admit) 85 bpm    Heart Rate (Exercise) 121 bpm    Heart Rate (Exit) 93 bpm    Rating of Perceived Exertion (Exercise) 13    Duration Continue with 30 min of aerobic exercise without signs/symptoms of physical distress.    Intensity THRR unchanged      Progression   Progression Continue to progress workloads to maintain intensity without signs/symptoms of physical distress.      Resistance Training   Training Prescription Yes    Weight 5    Reps 10-15      Treadmill   MPH 3    Grade 2    Minutes 15    METs 4.12      NuStep   Level 3    SPM 114    Minutes 15    METs 3.4      Home Exercise Plan   Plans to continue exercise at Home (comment)    Frequency Add 3 additional days to program exercise sessions.             Nutrition:  Target Goals: Understanding of nutrition guidelines, daily intake of sodium 1500mg , cholesterol 200mg , calories 30% from fat and 7% or  less from saturated fats, daily to have 5 or more servings of fruits and vegetables.  Biometrics:  Pre Biometrics - 12/21/23 0917       Pre Biometrics   Height 5\' 9"  (1.753 m)    Weight 203 lb 4.8 oz (92.2 kg)    Waist Circumference 39 inches    Hip Circumference 40 inches    Waist to Hip Ratio 0.98 %    BMI (Calculated) 30.01    Grip Strength 43.2 kg    Single Leg Stand 18.4 seconds              Nutrition Therapy Plan and Nutrition Goals:  Nutrition Therapy & Goals - 12/21/23 1526       Intervention Plan   Intervention Prescribe, educate and counsel regarding individualized specific dietary modifications aiming towards targeted core components such as weight, hypertension, lipid management, diabetes, heart failure and other comorbidities.;Nutrition handout(s) given to patient.    Expected Outcomes Short Term Goal: Understand basic principles of dietary content, such as calories, fat, sodium, cholesterol and nutrients.;Long Term Goal: Adherence to prescribed nutrition plan.             Nutrition Assessments:  MEDIFICTS Score Key: >=70 Need to make dietary changes  40-70 Heart Healthy Diet <= 40 Therapeutic Level Cholesterol Diet  Flowsheet Row CARDIAC REHAB PHASE II ORIENTATION from 12/21/2023 in [facility] CARDIAC REHABILITATION  Picture Your Plate Total Score on Admission 68      Picture Your Plate Scores: <81 Unhealthy dietary pattern with much room for improvement. 41-50 Dietary pattern unlikely to meet recommendations for good health and room for improvement. 51-60 More healthful dietary pattern, with some room for improvement.  >60 Healthy dietary pattern, although there may be some specific behaviors that could be improved.  Nutrition Goals Re-Evaluation:  Nutrition Goals Re-Evaluation     Row Name 01/11/24 1102 01/25/24 1041           Goals   Nutrition Goal Heart Healthy Diet Heart Healthy Diet      Comment [name] is doing well in rehab.  He  has been working hard on his diet.  He is now eating more fruits and vegetables than he ever used to.  He is trying to stick to lean meat and staying away from red meat as much as possible.  He has never been a big salt person so that part has been easy for him. [name] is doing well in rehab. He said his diet is better then it has been. He does like sweets and enjoys having it now and then. His wife and him are trying to choose heart healthy diet.      Expected Outcome Short: Continue to focus on more fruits and vegetables Long: Continue to eat heart healthy Continue to work on diet and eating more fruits and veggies and eating heart healthy               Nutrition Goals Discharge (Final Nutrition Goals Re-Evaluation):  Nutrition Goals Re-Evaluation - 01/25/24 1041       Goals   Nutrition Goal Heart Healthy Diet    Comment [name] is doing well in rehab. He said his diet is better then it has been. He does like sweets and enjoys having it now and then. His wife and him are trying to choose heart healthy diet.    Expected Outcome Continue to work on diet and eating more fruits and veggies and eating heart healthy             Psychosocial: Target Goals: Acknowledge presence or absence of significant depression and/or stress, maximize coping skills, provide positive support system. Participant is able to verbalize types and ability to use techniques and skills needed for reducing stress and depression.  Initial Review & Psychosocial Screening:  Initial Psych Review & Screening - 12/20/23 1143       Initial Review   Current issues with None Identified      Family Dynamics   Good Support System? Yes      Barriers   Psychosocial barriers to participate in program There are no identifiable barriers or psychosocial needs.;The patient should benefit from training in stress management and relaxation.      Screening Interventions   Interventions Encouraged to exercise;To provide support  and resources with identified psychosocial needs;Provide feedback about the scores to participant    Expected Outcomes Short Term goal: Utilizing psychosocial counselor, staff and physician to assist with identification of specific Stressors or current issues interfering with healing process. Setting desired goal for each stressor or current issue identified.;Long Term Goal: Stressors or current issues are controlled or eliminated.;Short Term goal: Identification and review with participant of any Quality of Life or Depression concerns found by scoring the questionnaire.;Long Term goal: The participant improves quality of Life and PHQ9 Scores as seen by post scores and/or verbalization of changes             Quality of Life Scores:  Quality of Life - 12/21/23 0918       Quality of Life   Select Quality of Life      Quality of Life Scores   Health/Function Pre 22.9 %    Socioeconomic Pre 28.63 %    Psych/Spiritual Pre 26.63 %  Family Pre 26.5 %    GLOBAL Pre 24.74 %            Scores of 19 and below usually indicate a poorer quality of life in these areas.  A difference of  2-3 points is a clinically meaningful difference.  A difference of 2-3 points in the total score of the Quality of Life Index has been associated with significant improvement in overall quality of life, self-image, physical symptoms, and general health in studies assessing change in quality of life.  PHQ-9: Review Flowsheet       01/20/2024 12/21/2023 12/17/2023 05/25/2022  Depression screen PHQ 2/9  Decreased Interest 0 2 2 0 0  Down, Depressed, Hopeless 0 0 1 0 0  PHQ - 2 Score 0 2 3 0 0  Altered sleeping 0 1 0 -  Tired, decreased energy 1 2 2  -  Change in appetite 0 2 1 -  Feeling bad or failure about yourself  0 0 1 -  Trouble concentrating 1 1 1  -  Moving slowly or fidgety/restless 0 1 1 -  Suicidal thoughts 0 0 0 -  PHQ-9 Score 2 9 9  -  Difficult doing work/chores Not difficult at all Somewhat  difficult Somewhat difficult -    Details       Multiple values from one day are sorted in reverse-chronological order        Interpretation of Total Score  Total Score Depression Severity:  1-4 = Minimal depression, 5-9 = Mild depression, 10-14 = Moderate depression, 15-19 = Moderately severe depression, 20-27 = Severe depression   Psychosocial Evaluation and Intervention:  Psychosocial Evaluation - 12/20/23 1143       Psychosocial Evaluation & Interventions   Interventions Stress management education;Relaxation education;Encouraged to exercise with the program and follow exercise prescription    Comments Patient was referred to CR with NSTEMI/Stent placement. He denies any depression, anxiety or stressors in his life. He says he was a little anxious following his MI but this has resolved. He has OSA and has used a CPAP machine up until 1 year ago and had inspire implanted and this is working great for him. He sleeps well taking ambien . He has severe restless leg syndrome and takes cymbalta  and gabapentin  for this. He is getting over the flu and says he has some residual SOB and coughing. His wife is his main support person. He is retired as the [employer] for 23 years. He enjoys working with wood and makes furniture. He seems very motivated to participate in the program. His main goals are to improve his SOB and get his strength and stamina back and to get healthy overall. He has no barriers to complete the program identified.    Expected Outcomes Short Term: start the program and attend consistently. Long Term: complete the program meeting his personal goals.    Continue Psychosocial Services  Follow up required by staff             Psychosocial Re-Evaluation:  Psychosocial Re-Evaluation     Row Name 01/11/24 1057 01/20/24 1110 01/25/24 1039         Psychosocial Re-Evaluation   Current issues with Current Sleep Concerns Current Sleep Concerns;Current Stress  Concerns;History of Depression Current Sleep Concerns;Current Stress Concerns;History of Depression     Comments [name] is doing well in rehab.  He is generally a positive person and feeling good.  He feels like he is in a good place mentally.  He denies  any major stressors other than his health, which he is working on.  He is sleeping decent.  He has good days and rough nights, but has been that way for a while.  He has tried a variety of techniques but has not had success.  He usually has an established bedtime routine and is regular.  They usually watch TV right up until bed, so we talked about trying to turn that off for a bit before bed.  I sent hm some sleep information for him to review. Reviewed patient health questionnaire (PHQ-9) with patient for follow up. Previously, patients score indicated signs/symptoms of depression.  Reviewed to see if patient is improving symptom wise while in program.  Score improved and patient states that it is because they have been moving more and feeling better overall. He said that his wife feels like he is doing better as well. [name] continues do well in rehab. He still does not sleep well but says that it is his normal. He use to wear a CPAP but now has the implant so he does not have to worry about the CPAP. His health is his only stressor but he said his life has been good and he gets to do his wood working hobby.     Expected Outcomes Short: Review sleep information Long; Continue to exercise for mental boost Short: Continue to exercise for mental boost Long: Continued positivity Short: Continue to exercise for mental boost Long: Continued positivity     Interventions Encouraged to attend Cardiac Rehabilitation for the exercise Encouraged to attend Cardiac Rehabilitation for the exercise Encouraged to attend Cardiac Rehabilitation for the exercise     Continue Psychosocial Services  Follow up required by staff Follow up required by staff Follow up required by staff               Psychosocial Discharge (Final Psychosocial Re-Evaluation):  Psychosocial Re-Evaluation - 01/25/24 1039       Psychosocial Re-Evaluation   Current issues with Current Sleep Concerns;Current Stress Concerns;History of Depression    Comments [name] continues do well in rehab. He still does not sleep well but says that it is his normal. He use to wear a CPAP but now has the implant so he does not have to worry about the CPAP. His health is his only stressor but he said his life has been good and he gets to do his wood working hobby.    Expected Outcomes Short: Continue to exercise for mental boost Long: Continued positivity    Interventions Encouraged to attend Cardiac Rehabilitation for the exercise    Continue Psychosocial Services  Follow up required by staff             Vocational Rehabilitation: Provide vocational rehab assistance to qualifying candidates.   Vocational Rehab Evaluation & Intervention:  Vocational Rehab - 12/20/23 1143       Initial Vocational Rehab Evaluation & Intervention   Assessment shows need for Vocational Rehabilitation No      Vocational Rehab Re-Evaulation   Comments Patient is retired.             Education: Education Goals: Education classes will be provided on a weekly basis, covering required topics. Participant will state understanding/return demonstration of topics presented.  Learning Barriers/Preferences:  Learning Barriers/Preferences - 12/21/23 6578       Learning Barriers/Preferences   Learning Barriers Sight   glasses for reading   Learning Preferences Verbal Instruction;Skilled Demonstration  Education Topics: Hypertension, Hypertension Reduction -Define heart disease and high blood pressure. Discus how high blood pressure affects the body and ways to reduce high blood pressure.   Exercise and Your Heart -Discuss why it is important to exercise, the FITT principles of exercise, normal and  abnormal responses to exercise, and how to exercise safely. Flowsheet Row CARDIAC REHAB PHASE II EXERCISE from 02/03/2024 in [place] CARDIAC REHABILITATION  Date 12/23/23  Educator HB  Instruction Review Code 1- Verbalizes Understanding       Angina -Discuss definition of angina, causes of angina, treatment of angina, and how to decrease risk of having angina. Flowsheet Row CARDIAC REHAB PHASE II EXERCISE from 02/03/2024 in [place] CARDIAC REHABILITATION  Date 02/03/24  Anthony Bateman and procedures.]  Educator HB  Instruction Review Code 1- Verbalizes Understanding       Cardiac Medications -Review what the following cardiac medications are used for, how they affect the body, and side effects that may occur when taking the medications.  Medications include Aspirin , Beta blockers, calcium  channel blockers, ACE Inhibitors, angiotensin receptor blockers, diuretics, digoxin, and antihyperlipidemics. Flowsheet Row CARDIAC REHAB PHASE II EXERCISE from 02/03/2024 in [place] CARDIAC REHABILITATION  Date 01/19/24  Educator hb  Instruction Review Code 1- Verbalizes Understanding       Congestive Heart Failure -Discuss the definition of CHF, how to live with CHF, the signs and symptoms of CHF, and how keep track of weight and sodium intake. Flowsheet Row CARDIAC REHAB PHASE II EXERCISE from 02/03/2024 in [place] CARDIAC REHABILITATION  Date 01/13/24  Educator HB  Instruction Review Code 1- Verbalizes Understanding       Heart Disease and Intimacy -Discus the effect sexual activity has on the heart, how changes occur during intimacy as we age, and safety during sexual activity.   Smoking Cessation / COPD -Discuss different methods to quit smoking, the health benefits of quitting smoking, and the definition of COPD.   Nutrition I: Fats -Discuss the types of cholesterol, what cholesterol does to the heart, and how cholesterol levels can be controlled. Flowsheet Row CARDIAC REHAB  PHASE II EXERCISE from 02/03/2024 in [place] CARDIAC REHABILITATION  Date 12/30/23  Educator HB  Instruction Review Code 1- Verbalizes Understanding       Nutrition II: Labels -Discuss the different components of food labels and how to read food label   Heart Parts/Heart Disease and PAD -Discuss the anatomy of the heart, the pathway of blood circulation through the heart, and these are affected by heart disease.   Stress I: Signs and Symptoms -Discuss the causes of stress, how stress may lead to anxiety and depression, and ways to limit stress. Flowsheet Row CARDIAC REHAB PHASE II EXERCISE from 02/03/2024 in [place] CARDIAC REHABILITATION  Date 01/06/24  Educator HB  Instruction Review Code 1- Verbalizes Understanding       Stress II: Relaxation -Discuss different types of relaxation techniques to limit stress.   Warning Signs of Stroke / TIA -Discuss definition of a stroke, what the signs and symptoms are of a stroke, and how to identify when someone is having stroke.   Knowledge Questionnaire Score:  Knowledge Questionnaire Score - 12/21/23 2956       Knowledge Questionnaire Score   Pre Score 18/26             Core Components/Risk Factors/Patient Goals at Admission:  Personal Goals and Risk Factors at Admission - 12/21/23 2130       Core Components/Risk Factors/Patient Goals  on Admission    Weight Management Weight Maintenance;Yes;Obesity;Weight Loss    Intervention Weight Management: Develop a combined nutrition and exercise program designed to reach desired caloric intake, while maintaining appropriate intake of nutrient and fiber, sodium and fats, and appropriate energy expenditure required for the weight goal.;Weight Management: Provide education and appropriate resources to help participant work on and attain dietary goals.;Weight Management/Obesity: Establish reasonable short term and long term weight goals.;Obesity: Provide education and appropriate  resources to help participant work on and attain dietary goals.    Admit Weight 203 lb 4.8 oz (92.2 kg)    Goal Weight: Short Term 198 lb (89.8 kg)    Goal Weight: Long Term 198 lb (89.8 kg)    Expected Outcomes Short Term: Continue to assess and modify interventions until short term weight is achieved;Long Term: Adherence to nutrition and physical activity/exercise program aimed toward attainment of established weight goal;Weight Maintenance: Understanding of the daily nutrition guidelines, which includes 25-35% calories from fat, 7% or less cal from saturated fats, less than 200mg  cholesterol, less than 1.5gm of sodium, & 5 or more servings of fruits and vegetables daily;Weight Loss: Understanding of general recommendations for a balanced deficit meal plan, which promotes 1-2 lb weight loss per week and includes a negative energy balance of [phone] kcal/d;Understanding recommendations for meals to include 15-35% energy as protein, 25-35% energy from fat, 35-60% energy from carbohydrates, less than 200mg  of dietary cholesterol, 20-35 gm of total fiber daily;Understanding of distribution of calorie intake throughout the day with the consumption of 4-5 meals/snacks    Improve shortness of breath with ADL's Yes    Intervention Provide education, individualized exercise plan and daily activity instruction to help decrease symptoms of SOB with activities of daily living.    Expected Outcomes Short Term: Improve cardiorespiratory fitness to achieve a reduction of symptoms when performing ADLs;Long Term: Be able to perform more ADLs without symptoms or delay the onset of symptoms    Hypertension Yes    Intervention Provide education on lifestyle modifcations including regular physical activity/exercise, weight management, moderate sodium restriction and increased consumption of fresh fruit, vegetables, and low fat dairy, alcohol moderation, and smoking cessation.;Monitor prescription use compliance.    Expected  Outcomes Short Term: Continued assessment and intervention until BP is < 140/5mm HG in hypertensive participants. < 130/58mm HG in hypertensive participants with diabetes, heart failure or chronic kidney disease.;Long Term: Maintenance of blood pressure at goal levels.    Lipids Yes    Intervention Provide education and support for participant on nutrition & aerobic/resistive exercise along with prescribed medications to achieve LDL 70mg , HDL >40mg .    Expected Outcomes Short Term: Participant states understanding of desired cholesterol values and is compliant with medications prescribed. Participant is following exercise prescription and nutrition guidelines.;Long Term: Cholesterol controlled with medications as prescribed, with individualized exercise RX and with personalized nutrition plan. Value goals: LDL < 70mg , HDL > 40 mg.             Core Components/Risk Factors/Patient Goals Review:   Goals and Risk Factor Review     Row Name 01/11/24 1103 01/25/24 1044           Core Components/Risk Factors/Patient Goals Review   Personal Goals Review Weight Management/Obesity;Hypertension;Improve shortness of breath with ADL's;Lipids Weight Management/Obesity;Hypertension;Improve shortness of breath with ADL's;Lipids      Review [name] is doing well in rehab.  His weight has been steady since being discharged.  He lost a lot of weight in hospital  from flu but has been able to maintain it.  He would like to continue to lose more.  He has lost 35 lbs since he retired from being more active.  He has noticed that his breathing has gotten better as he continues to recover from the flu.  His pressures are doing well and he has been checking them at home. [name] continue to do well in rehab. His weight has been steady but he has noticed it hs gone up a lbs ot 2 due to candy bowl. His blood pressure has been good at rehab, he does not check it at home.      Expected Outcomes Short: Conitnue to work on  weight loss Long: Continue to monitor risk factors. Short: Conitnue to work on weight loss Long: Continue to monitor risk factors.               Core Components/Risk Factors/Patient Goals at Discharge (Final Review):   Goals and Risk Factor Review - 01/25/24 1044       Core Components/Risk Factors/Patient Goals Review   Personal Goals Review Weight Management/Obesity;Hypertension;Improve shortness of breath with ADL's;Lipids    Review [name] continue to do well in rehab. His weight has been steady but he has noticed it hs gone up a lbs ot 2 due to candy bowl. His blood pressure has been good at rehab, he does not check it at home.    Expected Outcomes Short: Conitnue to work on weight loss Long: Continue to monitor risk factors.             ITP Comments:  ITP Comments     Row Name 12/20/23 1153 12/21/23 0912 12/22/23 1003 01/19/24 1144 02/16/24 1432   ITP Comments Virtual orientation visit for cardiac rehab with NSTEMI/Stent placement completed. On-site orientation visit scheduled for 12/21/23 at 8:30. Paperwork sent through [employer]. Patient says he will completed prior to arrival. Patient attend orientation today.  Patient is attending Cardiac Rehabilitation Program.  Documentation for diagnosis can be found in [facility] 12/03/23.  Reviewed medical chart, RPE/RPD, gym safety, and program guidelines.  Patient was fitted to equipment they will be using during rehab.  Patient is scheduled to start exercise on Thursday 12/23/23 at 1030.   Initial ITP created and sent for review and signature by Dr. Armida Lander, Medical Director for Cardiac Rehabilitation Program. 30 day review completed. ITP sent to Dr. Armida Lander, Medical Director of Cardiac Rehab. Continue with ITP unless changes are made by physician.  Pt was oriented yesterday. 30 day review completed. ITP sent to Dr. Armida Lander, Medical Director of Cardiac Rehab. Continue with ITP unless changes are made by physician. 30 day review  completed. ITP sent to Dr. Armida Lander, Medical Director of Cardiac Rehab. Continue with ITP unless changes are made by physician.            Comments: 30 day review

## 2024-02-17 ENCOUNTER — Encounter (HOSPITAL_COMMUNITY)
Admission: RE | Admit: 2024-02-17 | Discharge: 2024-02-17 | Disposition: A | Discharge status: Home / Self Care | Payer: Medicare Other | Source: Ambulatory Visit | Attending: Cardiology | Admitting: Cardiology

## 2024-02-17 DIAGNOSIS — Z955 Presence of coronary angioplasty implant and graft: Secondary | ICD-10-CM

## 2024-02-17 DIAGNOSIS — I214 Non-ST elevation (NSTEMI) myocardial infarction: Secondary | ICD-10-CM | POA: Diagnosis not present

## 2024-02-17 NOTE — Progress Notes (Signed)
 Daily Session Note  Patient Details  Name: MACLAIN COHRON MRN: [id number] Date of Birth: 04/28/1951 Referring Provider:   Flowsheet Row CARDIAC REHAB PHASE II ORIENTATION from 12/21/2023 in [facility] CARDIAC REHABILITATION  Referring Provider Carry Clapper MD  [facility] Cardiologist: Dr. Myrtie Atkinson Harding]       Encounter Date: 02/17/2024  Check In:  Session Check In - 02/17/24 1015       Check-In   Supervising physician immediately available to respond to emergencies See telemetry face sheet for immediately available ER MD    Location [facility]    Staff Present Clotilda Danish, BS, Exercise Physiologist;Tammee Thielke Lincoln Renshaw, RN, BSN    Virtual Visit No    Medication changes reported     No    Fall or balance concerns reported    No    Warm-up and Cool-down Performed on first and last piece of equipment    Resistance Training Performed Yes    VAD Patient? No    PAD/SET Patient? No      Pain Assessment   Currently in Pain? No/denies    Pain Score 0-No pain    Multiple Pain Sites No             Capillary Blood Glucose: No results found for this or any previous visit (from the past 24 hours).    Social History   Tobacco Use  Smoking Status Former   Types: Cigars   Passive exposure: Never  Smokeless Tobacco Never    Goals Met:  Independence with exercise equipment Exercise tolerated well No report of concerns or symptoms today Strength training completed today  Goals Unmet:  Not Applicable  Comments: Pt able to follow exercise prescription today without complaint.  Will continue to monitor for progression.

## 2024-02-18 ENCOUNTER — Encounter: Payer: Self-pay | Admitting: Internal Medicine

## 2024-02-18 DIAGNOSIS — G2581 Restless legs syndrome: Secondary | ICD-10-CM

## 2024-02-18 DIAGNOSIS — M4802 Spinal stenosis, cervical region: Secondary | ICD-10-CM

## 2024-02-18 DIAGNOSIS — M48062 Spinal stenosis, lumbar region with neurogenic claudication: Secondary | ICD-10-CM

## 2024-02-21 ENCOUNTER — Encounter: Payer: Self-pay | Admitting: Internal Medicine

## 2024-02-22 ENCOUNTER — Encounter (HOSPITAL_COMMUNITY)
Admission: RE | Admit: 2024-02-22 | Discharge: 2024-02-22 | Disposition: A | Discharge status: Home / Self Care | Payer: Medicare Other | Source: Ambulatory Visit | Attending: Cardiology | Admitting: Cardiology

## 2024-02-22 DIAGNOSIS — I214 Non-ST elevation (NSTEMI) myocardial infarction: Secondary | ICD-10-CM

## 2024-02-22 DIAGNOSIS — Z955 Presence of coronary angioplasty implant and graft: Secondary | ICD-10-CM

## 2024-02-22 NOTE — Progress Notes (Signed)
 Daily Session Note  Patient Details  Name: [name] MRN: [id number] Date of Birth: [birth year] Referring Provider:   Flowsheet Row CARDIAC REHAB PHASE II ORIENTATION from 12/21/2023 in [facility] CARDIAC REHABILITATION  Referring Provider Carry Clapper MD  [facility] Cardiologist: Dr. Myrtie Atkinson Harding]       Encounter Date: 02/22/2024  Check In:  Session Check In - 02/22/24 1030       Check-In   Supervising physician immediately available to respond to emergencies See telemetry face sheet for immediately available ER MD    Location [facility]    Staff Present Jefferey Minerva, RN;Heather Toy Freund, BS, Exercise Physiologist;Brittany Annette Barters, BSN, RN, WTA-C    Virtual Visit No    Medication changes reported     No    Fall or balance concerns reported    No    Warm-up and Cool-down Performed on first and last piece of equipment    Resistance Training Performed Yes    VAD Patient? No    PAD/SET Patient? No      Pain Assessment   Currently in Pain? No/denies    Multiple Pain Sites No             Capillary Blood Glucose: No results found for this or any previous visit (from the past 24 hours).    Social History   Tobacco Use  Smoking Status Former   Types: Cigars   Passive exposure: Never  Smokeless Tobacco Never    Goals Met:  Independence with exercise equipment Exercise tolerated well Strength training completed today  Goals Unmet:  Not Applicable  Comments: Pt able to follow exercise prescription today without complaint.  Will continue to monitor for progression.

## 2024-02-24 ENCOUNTER — Encounter (HOSPITAL_COMMUNITY)
Admission: RE | Admit: 2024-02-24 | Discharge: 2024-02-24 | Disposition: A | Discharge status: Home / Self Care | Payer: Medicare Other | Source: Ambulatory Visit | Attending: Cardiology | Admitting: Cardiology

## 2024-02-24 DIAGNOSIS — I214 Non-ST elevation (NSTEMI) myocardial infarction: Secondary | ICD-10-CM

## 2024-02-24 DIAGNOSIS — Z955 Presence of coronary angioplasty implant and graft: Secondary | ICD-10-CM

## 2024-02-24 NOTE — Progress Notes (Signed)
 Daily Session Note  Patient Details  Name: [name] MRN: [id number] Date of Birth: May 24, 1951 Referring Provider:   Flowsheet Row CARDIAC REHAB PHASE II ORIENTATION from 12/21/2023 in [facility] CARDIAC REHABILITATION  Referring Provider Carry Clapper MD  [facility] Cardiologist: Dr. Myrtie Atkinson Harding]       Encounter Date: 02/24/2024  Check In:  Session Check In - 02/24/24 1030       Check-In   Supervising physician immediately available to respond to emergencies See telemetry face sheet for immediately available MD    Location [facility]    Staff Present Clotilda Danish, BS, Exercise Physiologist;Jessica Zoila Hines, MA, RCEP, CCRP, CCET;Hillary Troutman BSN, RN    Virtual Visit No    Medication changes reported     No    Fall or balance concerns reported    No    Tobacco Cessation No Change    Warm-up and Cool-down Performed on first and last piece of equipment    Resistance Training Performed Yes    VAD Patient? No    PAD/SET Patient? No      Pain Assessment   Currently in Pain? No/denies    Pain Score 0-No pain    Multiple Pain Sites No             Capillary Blood Glucose: No results found for this or any previous visit (from the past 24 hours).    Social History   Tobacco Use  Smoking Status Former   Types: Cigars   Passive exposure: Never  Smokeless Tobacco Never    Goals Met:  Independence with exercise equipment Exercise tolerated well No report of concerns or symptoms today Strength training completed today  Goals Unmet:  Not Applicable  Comments: Pt able to follow exercise prescription today without complaint.  Will continue to monitor for progression.

## 2024-02-29 ENCOUNTER — Encounter (HOSPITAL_COMMUNITY)
Admission: RE | Admit: 2024-02-29 | Discharge: 2024-02-29 | Disposition: A | Discharge status: Home / Self Care | Payer: Medicare Other | Source: Ambulatory Visit | Attending: Cardiology

## 2024-02-29 DIAGNOSIS — I214 Non-ST elevation (NSTEMI) myocardial infarction: Secondary | ICD-10-CM | POA: Diagnosis not present

## 2024-02-29 DIAGNOSIS — Z955 Presence of coronary angioplasty implant and graft: Secondary | ICD-10-CM

## 2024-02-29 NOTE — Progress Notes (Signed)
 Daily Session Note  Patient Details  Name: [name] MRN: [id number] Date of Birth: 12-06-1950 Referring Provider:   Flowsheet Row CARDIAC REHAB PHASE II ORIENTATION from 12/21/2023 in [facility] CARDIAC REHABILITATION  Referring Provider Carry Clapper MD  [facility] Cardiologist: Dr. Myrtie Atkinson Harding]       Encounter Date: 02/29/2024  Check In:  Session Check In - 02/29/24 1045       Check-In   Supervising physician immediately available to respond to emergencies See telemetry face sheet for immediately available MD    Location [facility]    Staff Present Clotilda Danish, BS, Exercise Physiologist;Daron Stutz Zoila Hines, MA, RCEP, CCRP, CCET;Brooke Booth, RN    Virtual Visit No    Medication changes reported     No    Fall or balance concerns reported    No    Warm-up and Cool-down Performed on first and last piece of equipment    Resistance Training Performed Yes    VAD Patient? No    PAD/SET Patient? No      Pain Assessment   Currently in Pain? No/denies             Capillary Blood Glucose: No results found for this or any previous visit (from the past 24 hours).    Social History   Tobacco Use  Smoking Status Former   Types: Cigars   Passive exposure: Never  Smokeless Tobacco Never    Goals Met:  Independence with exercise equipment Exercise tolerated well No report of concerns or symptoms today Strength training completed today  Goals Unmet:  Not Applicable  Comments: Pt able to follow exercise prescription today without complaint.  Will continue to monitor for progression.

## 2024-03-02 ENCOUNTER — Encounter (HOSPITAL_COMMUNITY)
Admission: RE | Admit: 2024-03-02 | Discharge: 2024-03-02 | Disposition: A | Discharge status: Home / Self Care | Payer: Medicare Other | Source: Ambulatory Visit | Attending: Cardiology | Admitting: Cardiology

## 2024-03-02 DIAGNOSIS — I214 Non-ST elevation (NSTEMI) myocardial infarction: Secondary | ICD-10-CM | POA: Diagnosis not present

## 2024-03-02 DIAGNOSIS — Z955 Presence of coronary angioplasty implant and graft: Secondary | ICD-10-CM | POA: Diagnosis not present

## 2024-03-02 NOTE — Progress Notes (Signed)
 Daily Session Note  Patient Details  Name: [name] MRN: [id number] Date of Birth: 1951/09/08 Referring Provider:   Flowsheet Row CARDIAC REHAB PHASE II ORIENTATION from 12/21/2023 in [facility] CARDIAC REHABILITATION  Referring Provider Carry Clapper MD  [facility] Cardiologist: Dr. Myrtie Atkinson Harding]       Encounter Date: 03/02/2024  Check In:  Session Check In - 03/02/24 1027       Check-In   Supervising physician immediately available to respond to emergencies See telemetry face sheet for immediately available MD    Location [facility]    Staff Present Jerrol Morelle, BSN, RN, WTA-C;Heather Toy Freund, BS, Exercise Physiologist;Brooke Rollin Clock, RN    Virtual Visit No    Medication changes reported     No    Fall or balance concerns reported    No    Tobacco Cessation No Change    Warm-up and Cool-down Performed on first and last piece of equipment    Resistance Training Performed Yes    VAD Patient? No    PAD/SET Patient? No      Pain Assessment   Currently in Pain? No/denies    Pain Score 0-No pain             Capillary Blood Glucose: No results found for this or any previous visit (from the past 24 hours).    Social History   Tobacco Use  Smoking Status Former   Types: Cigars   Passive exposure: Never  Smokeless Tobacco Never    Goals Met:  Independence with exercise equipment Exercise tolerated well No report of concerns or symptoms today Strength training completed today  Goals Unmet:  Not Applicable  Comments: Pt able to follow exercise prescription today without complaint.  Will continue to monitor for progression.

## 2024-03-07 ENCOUNTER — Encounter (HOSPITAL_COMMUNITY): Payer: Medicare Other

## 2024-03-09 ENCOUNTER — Encounter (HOSPITAL_COMMUNITY): Payer: Medicare Other

## 2024-03-13 ENCOUNTER — Encounter (HOSPITAL_COMMUNITY)
Admission: RE | Admit: 2024-03-13 | Discharge: 2024-03-13 | Disposition: A | Discharge status: Home / Self Care | Source: Ambulatory Visit | Attending: Cardiology | Admitting: Cardiology

## 2024-03-13 VITALS — Ht 69.0 in | Wt 202.8 lb

## 2024-03-13 DIAGNOSIS — Z955 Presence of coronary angioplasty implant and graft: Secondary | ICD-10-CM | POA: Diagnosis not present

## 2024-03-13 DIAGNOSIS — I214 Non-ST elevation (NSTEMI) myocardial infarction: Secondary | ICD-10-CM

## 2024-03-13 NOTE — Patient Instructions (Signed)
 Discharge Patient Instructions  Patient Details  Name: [name] MRN: [id number] Date of Birth: 13-Apr-1951 Referring Provider:  Tobi Fortes, MD   Number of Visits: 52  Reason for Discharge:  Patient reached a stable level of exercise. Patient independent in their exercise. Patient has met program and personal goals.  Diagnosis:  NSTEMI (non-ST elevated myocardial infarction) [facility])  Status post coronary artery stent placement  Initial Exercise Prescription:  Initial Exercise Prescription - 12/21/23 0900       Date of Initial Exercise RX and Referring Provider   Date 12/21/23    Referring Provider Carry Clapper MD   Primary Cardiologist: Dr. Randene Bustard     Oxygen   Maintain Oxygen Saturation 88% or higher      Treadmill   MPH 2.2    Grade 0.5    Minutes 15    METs 2.84      NuStep   Level 3    SPM 80    Minutes 15    METs 2.5      Prescription Details   Frequency (times per week) 2    Duration Progress to 30 minutes of continuous aerobic without signs/symptoms of physical distress      Intensity   THRR 40-80% of Max Heartrate 105-132    Ratings of Perceived Exertion 11-13    Perceived Dyspnea 0-4      Progression   Progression Continue to progress workloads to maintain intensity without signs/symptoms of physical distress.      Resistance Training   Training Prescription Yes    Weight 5 lb    Reps 10-15             Discharge Exercise Prescription (Final Exercise Prescription Changes):  Exercise Prescription Changes - 02/29/24 1500       Response to Exercise   Blood Pressure (Admit) 126/62    Blood Pressure (Exit) 124/60    Heart Rate (Admit) 83 bpm    Heart Rate (Exercise) 117 bpm    Heart Rate (Exit) 92 bpm    Rating of Perceived Exertion (Exercise) 13    Duration Continue with 30 min of aerobic exercise without signs/symptoms of physical distress.    Intensity THRR unchanged      Progression   Progression Continue to progress  workloads to maintain intensity without signs/symptoms of physical distress.      Resistance Training   Training Prescription Yes    Weight 5    Reps 10-15      Treadmill   MPH 2.8    Grade 2    Minutes 15    METs 3.91      NuStep   Level 4    SPM 106    Minutes 15    METs 3.3      Home Exercise Plan   Plans to continue exercise at Home (comment)    Frequency Add 3 additional days to program exercise sessions.             Functional Capacity:  6 Minute Walk     Row Name 12/21/23 0913 03/13/24 1115       6 Minute Walk   Phase Initial Discharge    Distance 1325 feet 1570 feet    Distance Feet Change -- 242 ft    Walk Time 6 minutes 6 minutes    # of Rest Breaks 0 0    MPH 2.51 2.97    METS 2.76 3.24    RPE 7 11  Perceived Dyspnea  -- 0    VO2 Peak 9.67 11.33    Symptoms No No    Resting HR 78 bpm 87 bpm    Resting BP 104/62 120/60    Resting Oxygen Saturation  97 % 96 %    Exercise Oxygen Saturation  during 6 min walk 98 % 96 %    Max Ex. HR 103 bpm 113 bpm    Max Ex. BP 148/66 140/68    2 Minute Post BP 114/62 126/64            Nutrition & Weight - Outcomes:  Pre Biometrics - 12/21/23 0917       Pre Biometrics   Height 5\' 9"  (1.753 m)    Weight 92.2 kg    Waist Circumference 39 inches    Hip Circumference 40 inches    Waist to Hip Ratio 0.98 %    BMI (Calculated) 30.01    Grip Strength 43.2 kg    Single Leg Stand 18.4 seconds             Post Biometrics - 03/13/24 1115        Post  Biometrics   Height 5\' 9"  (1.753 m)    Weight 92 kg    Waist Circumference 40 inches    Hip Circumference 40 inches    Waist to Hip Ratio 1 %    BMI (Calculated) 29.94    Grip Strength 44.9 kg    Single Leg Stand 24.6 seconds            Goals reviewed with patient; copy given to patient.

## 2024-03-13 NOTE — Progress Notes (Signed)
 Daily Session Note  Patient Details  Name: [name] MRN: [id number] Date of Birth: 1951/07/20 Referring Provider:   Flowsheet Row CARDIAC REHAB PHASE II ORIENTATION from 12/21/2023 in [facility] CARDIAC REHABILITATION  Referring Provider Carry Clapper MD  [facility] Cardiologist: Dr. Myrtie Atkinson Harding]       Encounter Date: 03/13/2024  Check In:  Session Check In - 03/13/24 0930       Check-In   Supervising physician immediately available to respond to emergencies See telemetry face sheet for immediately available MD    Location [facility]    Staff Present Clotilda Danish, BS, Exercise Physiologist;Brittany Annette Barters, BSN, RN, WTA-C;Jasalyn Frysinger, RN;Jessica Amsterdam, MA, RCEP, CCRP, Amador Bad, RN, BSN    Virtual Visit No    Medication changes reported     No    Fall or balance concerns reported    No    Warm-up and Cool-down Performed on first and last piece of equipment    Resistance Training Performed Yes    VAD Patient? No    PAD/SET Patient? No      Pain Assessment   Currently in Pain? No/denies    Pain Score 0-No pain    Multiple Pain Sites No             Capillary Blood Glucose: No results found for this or any previous visit (from the past 24 hours).    Social History   Tobacco Use  Smoking Status Former   Types: Cigars   Passive exposure: Never  Smokeless Tobacco Never    Goals Met:  Independence with exercise equipment Exercise tolerated well No report of concerns or symptoms today Strength training completed today  Goals Unmet:  Not Applicable  Comments: Pt able to follow exercise prescription today without complaint.  Will continue to monitor for progression.

## 2024-03-14 ENCOUNTER — Ambulatory Visit: Payer: Medicare Other | Admitting: Pulmonary Disease

## 2024-03-14 ENCOUNTER — Encounter: Payer: Self-pay | Admitting: Pulmonary Disease

## 2024-03-14 ENCOUNTER — Encounter (HOSPITAL_COMMUNITY): Payer: Medicare Other

## 2024-03-14 VITALS — BP 140/82 | HR 79 | Ht 68.0 in | Wt 202.0 lb

## 2024-03-14 DIAGNOSIS — Z87891 Personal history of nicotine dependence: Secondary | ICD-10-CM | POA: Diagnosis not present

## 2024-03-14 DIAGNOSIS — G4733 Obstructive sleep apnea (adult) (pediatric): Secondary | ICD-10-CM

## 2024-03-14 DIAGNOSIS — G2581 Restless legs syndrome: Secondary | ICD-10-CM | POA: Diagnosis not present

## 2024-03-14 NOTE — Patient Instructions (Addendum)
 I will see you back in 6 to 8 weeks  Change the dosing of the Neurontin  to 600 at night and 300 in the morning, continue Cymbalta , continue Mirapex   If you need to go to 900 mg of Neurontin  at night, just send us  a MyChart message and let us  know  Advance therapy on your inspire as we discussed during the office visit today  We have not changed your therapy duration of the start delay, voltage is set at 0.9 and you are going up a setting every week as tolerated  Let us  know if any significant issues  We will delay getting a sleep study until you are more comfortable with the voltage setting

## 2024-03-14 NOTE — Progress Notes (Signed)
 CASSIDY TABET    [id number]    [birth year]  Primary Care Physician:Dixon, Heath Litten, MD  Referring Physician: Tobi Fortes, MD [address] [place],  [place] [address]  Chief complaint:   Patient with obstructive sleep apnea He does have an inspire device in place  HPI:  Has been on the same setting of his inspire since the last visit  Currently on a voltage setting of 0.8  States he wakes up sometimes in the middle of the night does not feel the machine is working well, when he wakes up in the morning sometimes also notices that he does not feel like it is working well When he turns the machine on before he falls asleep he definitely senses it is working  More sleepiness during the day lately  Continues to struggle with his restless legs despite using Neurontin  and Mirapex   No other changes in his medications Recent MRI for which he had 2 stents placed following cardiac catheterization  Also suffered from a flu recently  History of restless legs on multiple medications -Still having issues with significant restless legs despite gabapentin , Cymbalta , Requip  He is on C mode, 0.8 level, pause of 20 minutes, delay of 30 minutes, 8-hour therapy duration  Initially felt his sleep was better but lately continues to struggle with his sleep Does still snore significantly when he is on his back  His health has been relatively stable  Split-night study performed April 06, 2022 showing severe obstructive sleep apnea with AHI of 39, SpO2 low of 85 with optimal control of 7 to 10 cm of water   Drug-induced endoscopy was performed July 21, 2022 Implantation September 09, 2022 Device activation was November 26, 2022  Compliance from his device shows excellent compliance with device use  Incoming voltage was 0.8 Normal tongue movement Treatment duration of 8 hours Therapy delay of 30 minutes Therapy pause of 20 min  Average daily use of 5  hours   Outpatient Encounter Medications as of 03/14/2024  Medication Sig   aspirin  EC 81 MG tablet Take 1 tablet (81 mg total) by mouth daily. Swallow whole.   DULoxetine  (CYMBALTA ) 30 MG capsule TAKE TWO CAPSULES BY MOUTH EVERY DAY   Evolocumab  (REPATHA  SURECLICK) 140 MG/ML SOAJ Inject 140 mg into the skin every 14 (fourteen) days.   gabapentin  (NEURONTIN ) 300 MG capsule Take 300 mg by mouth See admin instructions. One tablet in the morning and afternoon and two tablets at bedtime   metoprolol  succinate (TOPROL -XL) 25 MG 24 hr tablet Take 0.5 tablets (12.5 mg total) by mouth daily.   pramipexole  (MIRAPEX ) 0.5 MG tablet TAKE 1 TABLET AT DINNER AND 1 TABLET AT BEDTIME.   prasugrel  (EFFIENT ) 10 MG TABS tablet Take 1 tablet (10 mg total) by mouth daily.   nitroGLYCERIN  (NITROSTAT ) 0.4 MG SL tablet Place 1 tablet (0.4 mg total) under the tongue every 5 (five) minutes as needed for chest pain.   zolpidem  (AMBIEN ) 10 MG tablet Take 1 tablet (10 mg total) by mouth at bedtime.   No facility-administered encounter medications on file as of 03/14/2024.    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   Sleep apnea    uses Cpap nightly    Past Surgical History:  Procedure Laterality Date   BACK SURGERY     lumbar 2020   CATARACT EXTRACTION     10-15 years ago   COLONOSCOPY  05/25/2012   Procedure: COLONOSCOPY;  Surgeon: Ruby Corporal, MD;  Location: [facility];  Service: Endoscopy;  Laterality: N/A;  1030   COLONOSCOPY N/A 05/14/2017   Procedure: COLONOSCOPY;  Surgeon:  Ruby Corporal, MD;  Location: [facility];  Service: Endoscopy;  Laterality: N/A;  10:10   CORONARY PRESSURE/FFR WITH 3D MAPPING N/A 12/06/2023   Procedure: Coronary Pressure/FFR w/3D Mapping;  Surgeon: Arleen Lacer, MD;  Location: [facility] INVASIVE CV LAB;  Service: Cardiovascular;  Laterality: N/A;   CORONARY STENT INTERVENTION N/A 12/06/2023   Procedure: CORONARY STENT INTERVENTION;  Surgeon: Arleen Lacer, MD;  Location: [facility] INVASIVE CV LAB;  Service: Cardiovascular;  Laterality: N/A;   CYSTOSCOPY/RETROGRADE/URETEROSCOPY Right 06/07/2014   Procedure: CYSTOSCOPY, RIGHT RETROGRADE, RIGHT URETEROSCOPY, RIGHT URETERAL BALLOON DILATATION, BIOPSY, RIGHT URETERAL STENT;  Surgeon: Roque Collar, MD;  Location: [facility];  Service: Urology;  Laterality: Right;   DRUG INDUCED ENDOSCOPY Right 07/21/2022   Procedure: DRUG INDUCED ENDOSCOPY;  Surgeon: Virgina Grills, MD;  Location: [facility];  Service: ENT;  Laterality: Right;   I & D EXTREMITY Left 05/01/2021   Procedure: LEFT INDEX AND LONG FINGER AMPUTATION AND RING FINGER NAIL BED REPAIR.;  Surgeon: Brunilda Capra, MD;  Location: [facility];  Service: Orthopedics;  Laterality: Left;  LEFT INDEX AND LONG FINGER AMPUTATION AND RING FINGER NAIL BED REPAIR.   IMPLANTATION OF HYPOGLOSSAL NERVE STIMULATOR Right 09/09/2022   Procedure: IMPLANTATION OF HYPOGLOSSAL NERVE STIMULATOR;  Surgeon: Virgina Grills, MD;  Location: [facility];  Service: ENT;  Laterality: Right;   kidney stone  1983   Had partial right nephrectomy   KNEE ARTHROSCOPY WITH MEDIAL MENISECTOMY Right 04/14/2018   Procedure: KNEE ARTHROSCOPY WITH MEDIAL MENISECTOMY;  Surgeon: Darrin Emerald, MD;  Location: [facility];  Service: Orthopedics;  Laterality: Right;   left foot surgery  2010   repair of joint   LEFT HEART CATH AND CORONARY ANGIOGRAPHY N/A 12/06/2023   Procedure: LEFT HEART CATH AND CORONARY ANGIOGRAPHY;  Surgeon: Arleen Lacer, MD;   Location: [facility] INVASIVE CV LAB;  Service: Cardiovascular;  Laterality: N/A;   PARTIAL KNEE ARTHROPLASTY Left 09/16/2021   Procedure: UNICOMPARTMENTAL KNEE MEDIALLY;  Surgeon: Claiborne Crew, MD;  Location: [facility];  Service: Orthopedics;  Laterality: Left;   WRIST SURGERY Bilateral     Family History  Problem Relation Age of Onset   Cancer Father        Bladder cancer   Lung cancer Brother    Healthy Mother    Healthy Brother    Healthy Brother     Social History   Socioeconomic History   Marital status: Married    Spouse name: Kasra Melvin   Number of children: Not on file   Years of education: 18   Highest education level: Master's degree (e.g., MA, MS, MEng, MEd, MSW, MBA)  Occupational History   Occupation: Retired    Comment: Environmental manager for [employer]  Tobacco Use   Smoking status: Former  Types: Cigars    Passive exposure: Never   Smokeless tobacco: Never  Vaping Use   Vaping status: Never Used  Substance and Sexual Activity   Alcohol use: Yes    Comment: rare   Drug use: No   Sexual activity: Yes    Birth control/protection: None  Other Topics Concern   Not on file  Social History Narrative   He is married.  Has no children.  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     December 2020: He said he used to walk about 2 to 3 miles a day with his wife, but now she keeps going and he has to stop because of dyspnea.   Social Drivers of Corporate investment banker Strain: Low Risk  (05/25/2022)   Overall Financial Resource Strain (CARDIA)    Difficulty of Paying Living Expenses: Not hard at all  Food Insecurity: No Food Insecurity (12/10/2023)   Hunger Vital Sign    Worried About Running Out of Food in the Last Year: Never true    Ran Out of Food in the Last Year: Never true  Transportation Needs: No Transportation Needs (12/10/2023)   PRAPARE - Administrator, Civil Service (Medical): No    Lack of Transportation (Non-Medical): No  Physical  Activity: Sufficiently Active (05/25/2022)   Exercise Vital Sign    Days of Exercise per Week: 5 days    Minutes of Exercise per Session: 30 min  Stress: No Stress Concern Present (05/25/2022)   [employer] of Occupational Health - Occupational Stress Questionnaire    Feeling of Stress : Only a little  Social Connections: Moderately Isolated (12/04/2023)   Social Connection and Isolation Panel [NHANES]    Frequency of Communication with Friends and Family: More than three times a week    Frequency of Social Gatherings with Friends and Family: Once a week    Attends Religious Services: Never    Database administrator or Organizations: No    Attends Banker Meetings: Never    Marital Status: Married  Catering manager Violence: Not At Risk (12/10/2023)   Humiliation, Afraid, Rape, and Kick questionnaire    Fear of Current or Ex-Partner: No    Emotionally Abused: No    Physically Abused: No    Sexually Abused: No    Review of Systems  Respiratory:  Positive for apnea.   Psychiatric/Behavioral:  Positive for sleep disturbance.     Vitals:   03/14/24 1046  BP: (!) 140/82  Pulse: 79  SpO2: 98%     Physical Exam Constitutional:      Appearance: He is obese.  HENT:     Head: Normocephalic.     Right Ear: Tympanic membrane normal.     Mouth/Throat:     Mouth: Mucous membranes are moist.  Eyes:     General: No scleral icterus.    Pupils: Pupils are equal, round, and reactive to light.  Cardiovascular:     Rate and Rhythm: Normal rate and regular rhythm.     Heart sounds: No murmur heard.    No friction rub.  Pulmonary:     Effort: No respiratory distress.     Breath sounds: No stridor. No wheezing or rhonchi.  Musculoskeletal:     Cervical back: No rigidity or tenderness.  Neurological:     Mental Status: He is alert.  Psychiatric:        Mood and Affect: Mood normal.    Data Reviewed: Compliance  data reviewed Remains compliant with  treatment  sleep study report 08/19/2023-WatchPAT study AHI of 15.2  His device was interrogated in the office today Stimulation settings were changed during the visit today after interrogation Currently on C mode -Therapy delay of 30 minutes -Voltage of 0.9 -Therapy duration of 8 hours  Assessment:  Continue slow titration of inspire device on a weekly basis  Obstructive sleep apnea status post inspire device placement  Restless leg syndrome  He continues to have significant difficulty with his restless legs  Plan will be to change his use of Neurontin  to 600 at night and 300 in the morning, continue Mirapex , continue Cymbalta   Plan/Recommendations:  Change Neurontin  to 600 at night and 300 in the morning  Attempting to optimize treatment for his restless legs  Continue slow escalation of his inspire voltage  Follow-up in about 6 to 8 weeks  Will schedule for WatchPAT study following next visit if he is more stable   Myer Artis MD [facility] 03/14/2024, 10:51 AM  CC: Tobi Fortes, MD

## 2024-03-15 ENCOUNTER — Encounter (HOSPITAL_COMMUNITY): Payer: Self-pay | Admitting: *Deleted

## 2024-03-15 ENCOUNTER — Other Ambulatory Visit: Payer: Self-pay | Admitting: Internal Medicine

## 2024-03-15 DIAGNOSIS — I214 Non-ST elevation (NSTEMI) myocardial infarction: Secondary | ICD-10-CM

## 2024-03-15 DIAGNOSIS — Z955 Presence of coronary angioplasty implant and graft: Secondary | ICD-10-CM

## 2024-03-15 DIAGNOSIS — G4700 Insomnia, unspecified: Secondary | ICD-10-CM

## 2024-03-15 MED ORDER — ZOLPIDEM TARTRATE 10 MG PO TABS: 10.0000 mg | ORAL_TABLET | Freq: Every day | ORAL | 0 refills | Status: DC

## 2024-03-15 NOTE — Progress Notes (Signed)
 Cardiac Individual Treatment Plan  Patient Details  Name: [name] MRN: [id number] Date of Birth: Nov 06, 1950 Referring Provider:   Flowsheet Row CARDIAC REHAB PHASE II ORIENTATION from 12/21/2023 in [facility] CARDIAC REHABILITATION  Referring Provider Carry Clapper MD  [facility] Cardiologist: Dr. Myrtie Atkinson [place]]       Initial Encounter Date:  Flowsheet Row CARDIAC REHAB PHASE II ORIENTATION from 12/21/2023 in [place] CARDIAC REHABILITATION  Date 12/21/23       Visit Diagnosis: NSTEMI (non-ST elevated myocardial infarction) [facility])  Status post coronary artery stent placement  Patient's Home Medications on Admission:  Current Outpatient Medications:    aspirin  EC 81 MG tablet, Take 1 tablet (81 mg total) by mouth daily. Swallow whole., Disp: , Rfl:    DULoxetine  (CYMBALTA ) 30 MG capsule, TAKE TWO CAPSULES BY MOUTH EVERY DAY, Disp: 60 capsule, Rfl: 2   Evolocumab  (REPATHA  SURECLICK) 140 MG/ML SOAJ, Inject 140 mg into the skin every 14 (fourteen) days., Disp: 2 mL, Rfl: 2   gabapentin  (NEURONTIN ) 300 MG capsule, Take 300 mg by mouth See admin instructions. One tablet in the morning and afternoon and two tablets at bedtime, Disp: , Rfl:    metoprolol  succinate (TOPROL -XL) 25 MG 24 hr tablet, Take 0.5 tablets (12.5 mg total) by mouth daily., Disp: 45 tablet, Rfl: 0   nitroGLYCERIN  (NITROSTAT ) 0.4 MG SL tablet, Place 1 tablet (0.4 mg total) under the tongue every 5 (five) minutes as needed for chest pain., Disp: 25 tablet, Rfl: 11   pramipexole  (MIRAPEX ) 0.5 MG tablet, TAKE 1 TABLET AT DINNER AND 1 TABLET AT BEDTIME., Disp: 180 tablet, Rfl: 0   prasugrel  (EFFIENT ) 10 MG TABS tablet, Take 1 tablet (10 mg total) by mouth daily., Disp: 180 tablet, Rfl: 0   zolpidem  (AMBIEN ) 10 MG tablet, Take 1 tablet (10 mg total) by mouth at bedtime., Disp: 30 tablet, Rfl: 0  Past Medical History: Past Medical History:  Diagnosis Date   Anemia    Anxiety    Arthritis    spinal stenosis    Depression    History of kidney stones    Hyperlipidemia    Hypertension    Kidney function abnormal 1983   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   Sleep apnea    uses Cpap nightly    Tobacco Use: Social History   Tobacco Use  Smoking Status Former   Types: Cigars   Passive exposure: Never  Smokeless Tobacco Never    Labs: Review Flowsheet       Latest Ref Rng & Units 05/14/2017 12/05/2023 02/10/2024  Labs for ITP Cardiac and Pulmonary Rehab  Cholestrol 100 - 199 mg/dL 782  956  213   LDL (calc) 0 - 99 mg/dL 086  578  45   HDL-C >46 mg/dL 29  38  51   Trlycerides 0 - 149 mg/dL 962  58  77   Hemoglobin A1c 4.8 - 5.6 % - 5.4  -    Capillary Blood Glucose: No results found for: "GLUCAP"   Exercise Target Goals: Exercise Program Goal: Individual exercise prescription set using results from initial 6 min walk test and THRR while considering  patient's activity barriers and safety.   Exercise Prescription Goal: Starting with aerobic activity 30 plus minutes a day, 3 days per week for initial exercise prescription. Provide home exercise prescription and guidelines that participant acknowledges understanding prior to discharge.  Activity Barriers & Risk Stratification:  Activity Barriers &  Cardiac Risk Stratification - 12/21/23 0913       Activity Barriers & Cardiac Risk Stratification   Activity Barriers Left Knee Replacement;Deconditioning;Shortness of Breath;Balance Concerns;Arthritis;Neck/Spine Problems;Joint Problems;Muscular Weakness   hx spinal stenosis, arthritis all over, bilateral knee surgeries (occassional pain)   Cardiac Risk Stratification Moderate             6 Minute Walk:  6 Minute Walk     Row Name 12/21/23 0913 03/13/24 1115       6 Minute Walk   Phase Initial Discharge    Distance 1325 feet 1570 feet    Distance Feet Change -- 242 ft    Walk Time 6 minutes 6 minutes    # of Rest Breaks 0 0     MPH 2.51 2.97    METS 2.76 3.24    RPE 7 11    Perceived Dyspnea  -- 0    VO2 Peak 9.67 11.33    Symptoms No No    Resting HR 78 bpm 87 bpm    Resting BP 104/62 120/60    Resting Oxygen Saturation  97 % 96 %    Exercise Oxygen Saturation  during 6 min walk 98 % 96 %    Max Ex. HR 103 bpm 113 bpm    Max Ex. BP 148/66 140/68    2 Minute Post BP 114/62 126/64             Oxygen Initial Assessment:   Oxygen Re-Evaluation:   Oxygen Discharge (Final Oxygen Re-Evaluation):   Initial Exercise Prescription:  Initial Exercise Prescription - 12/21/23 0900       Date of Initial Exercise RX and Referring Provider   Date 12/21/23    Referring Provider Carry Clapper MD   Primary Cardiologist: Dr. Randene Bustard     Oxygen   Maintain Oxygen Saturation 88% or higher      Treadmill   MPH 2.2    Grade 0.5    Minutes 15    METs 2.84      NuStep   Level 3    SPM 80    Minutes 15    METs 2.5      Prescription Details   Frequency (times per week) 2    Duration Progress to 30 minutes of continuous aerobic without signs/symptoms of physical distress      Intensity   THRR 40-80% of Max Heartrate 105-132    Ratings of Perceived Exertion 11-13    Perceived Dyspnea 0-4      Progression   Progression Continue to progress workloads to maintain intensity without signs/symptoms of physical distress.      Resistance Training   Training Prescription Yes    Weight 5 lb    Reps 10-15             Perform Capillary Blood Glucose checks as needed.  Exercise Prescription Changes:   Exercise Prescription Changes     Row Name 12/21/23 0900 12/30/23 1200 01/11/24 1000 02/01/24 1500 02/29/24 1500     Response to Exercise   Blood Pressure (Admit) 104/62 110/66 -- 110/70 126/62   Blood Pressure (Exercise) 148/66 146/72 -- -- --   Blood Pressure (Exit) 114/62 118/60 -- 122/78 124/60   Heart Rate (Admit) 78 bpm 76 bpm -- 85 bpm 83 bpm   Heart Rate (Exercise) 103 bpm 117 bpm --  121 bpm 117 bpm   Heart Rate (Exit) 80 bpm 88 bpm -- 93 bpm 92 bpm   Oxygen  Saturation (Admit) 97 % -- -- -- --   Oxygen Saturation (Exercise) 98 % -- -- -- --   Rating of Perceived Exertion (Exercise) 7 11 -- 13 13   Symptoms none -- -- -- --   Comments walk test results -- -- -- --   Duration -- Continue with 30 min of aerobic exercise without signs/symptoms of physical distress. -- Continue with 30 min of aerobic exercise without signs/symptoms of physical distress. Continue with 30 min of aerobic exercise without signs/symptoms of physical distress.   Intensity -- THRR unchanged -- THRR unchanged THRR unchanged     Progression   Progression -- Continue to progress workloads to maintain intensity without signs/symptoms of physical distress. -- Continue to progress workloads to maintain intensity without signs/symptoms of physical distress. Continue to progress workloads to maintain intensity without signs/symptoms of physical distress.     Resistance Training   Training Prescription -- Yes -- Yes Yes   Weight -- 5 -- 5 5   Reps -- 10-15 -- 10-15 10-15     Treadmill   MPH -- 2.5 -- 3 2.8   Grade -- 1 -- 2 2   Minutes -- 15 -- 15 15   METs -- 3.26 -- 4.12 3.91     NuStep   Level -- 3 -- 3 4   SPM -- 99 -- 114 106   Minutes -- 15 -- 15 15   METs -- 2.4 -- 3.4 3.3     Home Exercise Plan   Plans to continue exercise at -- -- Home (comment)  walking, staff videos Home (comment) Home (comment)   Frequency -- -- Add 3 additional days to program exercise sessions. Add 3 additional days to program exercise sessions. Add 3 additional days to program exercise sessions.   Initial Home Exercises Provided -- -- 01/11/24 -- --            Exercise Comments:   Exercise Goals and Review:   Exercise Goals     Row Name 12/21/23 0917             Exercise Goals   Increase Physical Activity Yes       Intervention Provide advice, education, support and counseling about physical  activity/exercise needs.;Develop an individualized exercise prescription for aerobic and resistive training based on initial evaluation findings, risk stratification, comorbidities and participant's personal goals.       Expected Outcomes Long Term: Add in home exercise to make exercise part of routine and to increase amount of physical activity.;Long Term: Exercising regularly at least 3-5 days a week.;Short Term: Attend rehab on a regular basis to increase amount of physical activity.       Increase Strength and Stamina Yes       Intervention Provide advice, education, support and counseling about physical activity/exercise needs.;Develop an individualized exercise prescription for aerobic and resistive training based on initial evaluation findings, risk stratification, comorbidities and participant's personal goals.       Expected Outcomes Short Term: Increase workloads from initial exercise prescription for resistance, speed, and METs.;Short Term: Perform resistance training exercises routinely during rehab and add in resistance training at home;Long Term: Improve cardiorespiratory fitness, muscular endurance and strength as measured by increased METs and functional capacity ( )       Able to understand and use rate of perceived exertion (RPE) scale Yes       Intervention Provide education and explanation on how to use RPE scale  Expected Outcomes Short Term: Able to use RPE daily in rehab to express subjective intensity level;Long Term:  Able to use RPE to guide intensity level when exercising independently       Able to understand and use Dyspnea scale Yes       Intervention Provide education and explanation on how to use Dyspnea scale       Expected Outcomes Short Term: Able to use Dyspnea scale daily in rehab to express subjective sense of shortness of breath during exertion;Long Term: Able to use Dyspnea scale to guide intensity level when exercising independently       Knowledge and  understanding of Target Heart Rate Range (THRR) Yes       Intervention Provide education and explanation of THRR including how the numbers were predicted and where they are located for reference       Expected Outcomes Short Term: Able to state/look up THRR;Long Term: Able to use THRR to govern intensity when exercising independently;Short Term: Able to use daily as guideline for intensity in rehab       Able to check pulse independently Yes       Intervention Provide education and demonstration on how to check pulse in carotid and radial arteries.;Review the importance of being able to check your own pulse for safety during independent exercise       Expected Outcomes Short Term: Able to explain why pulse checking is important during independent exercise;Long Term: Able to check pulse independently and accurately       Understanding of Exercise Prescription Yes       Intervention Provide education, explanation, and written materials on patient's individual exercise prescription       Expected Outcomes Short Term: Able to explain program exercise prescription;Long Term: Able to explain home exercise prescription to exercise independently                Exercise Goals Re-Evaluation :  Exercise Goals Re-Evaluation     Row Name 01/03/24 1219 01/11/24 1055 01/25/24 1036 02/02/24 1318 03/13/24 1347     Exercise Goal Re-Evaluation   Exercise Goals Review Increase Physical Activity;Increase Strength and Stamina;Understanding of Exercise Prescription Increase Physical Activity;Increase Strength and Stamina;Able to understand and use rate of perceived exertion (RPE) scale;Knowledge and understanding of Target Heart Rate Range (THRR);Able to understand and use Dyspnea scale;Able to check pulse independently;Understanding of Exercise Prescription Increase Physical Activity;Increase Strength and Stamina;Understanding of Exercise Prescription -- Increase Physical Activity;Increase Strength and  Stamina;Understanding of Exercise Prescription   Comments [name] is doing well in rehab and is on his 6th visit. He has increased his speed on the treadmill to 2.5. He is execising at an RPE of 11 on both sets of equipment and is wanting to increase to push himsl;ef. Will continue to monitor and progress as able. [name] is doing well in rehab. He is feeling good and walking some around house and at store.  Reviewed home exercise with pt today.  Pt plans to walk at home for exercise.  We also talked about using staff videos on bad weather days. Reviewed THR, pulse, RPE, sign and symptoms, pulse oximetery and when to call 911 or MD.  Also discussed weather considerations and indoor options.  Pt voiced understanding. [name] is doing great with exercise. He continues to feel good and is doing yard work around [employer]. He is doing his wood working as well. He doesnt get as wrn out as he use to. [name] is doing well in  rehab and is on his 19th visit. He is increasing his speed on the treadmill and level on the nustep. He is at a walking speed of 3 mph with a grade of 2 and level 3 on the nustoe Will continue to monitor and progress as able [name] is doing well in rehab. He is almost done with the program and has been increasing his walking levels on the treadmill. He is walking at home and plans to continue to walk when he is done with the program. He stated that he has more energy since starting the program. He did his post walk test today and went 23ft longer then the first visit.   Expected Outcomes Continue to come to reah Short: Start to add in more exercise at home on off days Long: Continue to improve stamina Short: Start to add in more exercise at home on off days Long: Continue to improve stamina continue to attend rehab Short: continue to attend reha to the end  long : continue to exercise at [facility]             Discharge Exercise Prescription (Final Exercise Prescription Changes):  Exercise Prescription  Changes - 02/29/24 1500       Response to Exercise   Blood Pressure (Admit) 126/62    Blood Pressure (Exit) 124/60    Heart Rate (Admit) 83 bpm    Heart Rate (Exercise) 117 bpm    Heart Rate (Exit) 92 bpm    Rating of Perceived Exertion (Exercise) 13    Duration Continue with 30 min of aerobic exercise without signs/symptoms of physical distress.    Intensity THRR unchanged      Progression   Progression Continue to progress workloads to maintain intensity without signs/symptoms of physical distress.      Resistance Training   Training Prescription Yes    Weight 5    Reps 10-15      Treadmill   MPH 2.8    Grade 2    Minutes 15    METs 3.91      NuStep   Level 4    SPM 106    Minutes 15    METs 3.3      Home Exercise Plan   Plans to continue exercise at Home (comment)    Frequency Add 3 additional days to program exercise sessions.             Nutrition:  Target Goals: Understanding of nutrition guidelines, daily intake of sodium 1500mg , cholesterol 200mg , calories 30% from fat and 7% or less from saturated fats, daily to have 5 or more servings of fruits and vegetables.  Biometrics:  Pre Biometrics - 12/21/23 0917       Pre Biometrics   Height 5\' 9"  (1.753 m)    Weight 203 lb 4.8 oz (92.2 kg)    Waist Circumference 39 inches    Hip Circumference 40 inches    Waist to Hip Ratio 0.98 %    BMI (Calculated) 30.01    Grip Strength 43.2 kg    Single Leg Stand 18.4 seconds             Post Biometrics - 03/13/24 1115        Post  Biometrics   Height 5\' 9"  (1.753 m)    Weight 202 lb 13.2 oz (92 kg)    Waist Circumference 40 inches    Hip Circumference 40 inches    Waist to Hip Ratio 1 %  BMI (Calculated) 29.94    Grip Strength 44.9 kg    Single Leg Stand 24.6 seconds             Nutrition Therapy Plan and Nutrition Goals:  Nutrition Therapy & Goals - 12/21/23 1526       Intervention Plan   Intervention Prescribe, educate and counsel  regarding individualized specific dietary modifications aiming towards targeted core components such as weight, hypertension, lipid management, diabetes, heart failure and other comorbidities.;Nutrition handout(s) given to patient.    Expected Outcomes Short Term Goal: Understand basic principles of dietary content, such as calories, fat, sodium, cholesterol and nutrients.;Long Term Goal: Adherence to prescribed nutrition plan.             Nutrition Assessments:  MEDIFICTS Score Key: >=70 Need to make dietary changes  40-70 Heart Healthy Diet <= 40 Therapeutic Level Cholesterol Diet  Flowsheet Row CARDIAC REHAB PHASE II ORIENTATION from 12/21/2023 in [facility] CARDIAC [facility]ABILITATION  Picture Your Plate Total Score on Admission 68      Picture Your Plate Scores: <40 Unhealthy dietary pattern with much room for improvement. 41-50 Dietary pattern unlikely to meet recommendations for good health and room for improvement. 51-60 More healthful dietary pattern, with some room for improvement.  >60 Healthy dietary pattern, although there may be some specific behaviors that could be improved.    Nutrition Goals Re-Evaluation:  Nutrition Goals Re-Evaluation     Row Name 01/11/24 1102 01/25/24 1041           Goals   Nutrition Goal Heart Healthy Diet Heart Healthy Diet      Comment [name] is doing well in rehab.  He has been working hard on his diet.  He is now eating more fruits and vegetables than he ever used to.  He is trying to stick to lean meat and staying away from red meat as much as possible.  He has never been a big salt person so that part has been easy for him. [name] is doing well in rehab. He said his diet is better then it has been. He does like sweets and enjoys having it now and then. His wife and him are trying to choose heart healthy diet.      Expected Outcome Short: Continue to focus on more fruits and vegetables Long: Continue to eat heart healthy Continue to work on  diet and eating more fruits and veggies and eating heart healthy               Nutrition Goals Discharge (Final Nutrition Goals Re-Evaluation):  Nutrition Goals Re-Evaluation - 01/25/24 1041       Goals   Nutrition Goal Heart Healthy Diet    Comment [name] is doing well in rehab. He said his diet is better then it has been. He does like sweets and enjoys having it now and then. His wife and him are trying to choose heart healthy diet.    Expected Outcome Continue to work on diet and eating more fruits and veggies and eating heart healthy             Psychosocial: Target Goals: Acknowledge presence or absence of significant depression and/or stress, maximize coping skills, provide positive support system. Participant is able to verbalize types and ability to use techniques and skills needed for reducing stress and depression.  Initial Review & Psychosocial Screening:  Initial Psych Review & Screening - 12/20/23 1143       Initial Review   Current  issues with None Identified      Family Dynamics   Good Support System? Yes      Barriers   Psychosocial barriers to participate in program There are no identifiable barriers or psychosocial needs.;The patient should benefit from training in stress management and relaxation.      Screening Interventions   Interventions Encouraged to exercise;To provide support and resources with identified psychosocial needs;Provide feedback about the scores to participant    Expected Outcomes Short Term goal: Utilizing psychosocial counselor, staff and physician to assist with identification of specific Stressors or current issues interfering with healing process. Setting desired goal for each stressor or current issue identified.;Long Term Goal: Stressors or current issues are controlled or eliminated.;Short Term goal: Identification and review with participant of any Quality of Life or Depression concerns found by scoring the questionnaire.;Long  Term goal: The participant improves quality of Life and PHQ9 Scores as seen by post scores and/or verbalization of changes             Quality of Life Scores:  Quality of Life - 12/21/23 0918       Quality of Life   Select Quality of Life      Quality of Life Scores   Health/Function Pre 22.9 %    Socioeconomic Pre 28.63 %    Psych/Spiritual Pre 26.63 %    Family Pre 26.5 %    GLOBAL Pre 24.74 %            Scores of 19 and below usually indicate a poorer quality of life in these areas.  A difference of  2-3 points is a clinically meaningful difference.  A difference of 2-3 points in the total score of the Quality of Life Index has been associated with significant improvement in overall quality of life, self-image, physical symptoms, and general health in studies assessing change in quality of life.  PHQ-9: Review Flowsheet       01/20/2024 12/21/2023 12/17/2023 05/25/2022  Depression screen PHQ 2/9  Decreased Interest 0 2 2 0 0  Down, Depressed, Hopeless 0 0 1 0 0  PHQ - 2 Score 0 2 3 0 0  Altered sleeping 0 1 0 -  Tired, decreased energy 1 2 2  -  Change in appetite 0 2 1 -  Feeling bad or failure about yourself  0 0 1 -  Trouble concentrating 1 1 1  -  Moving slowly or fidgety/restless 0 1 1 -  Suicidal thoughts 0 0 0 -  PHQ-9 Score 2 9 9  -  Difficult doing work/chores Not difficult at all Somewhat difficult Somewhat difficult -    Details       Multiple values from one day are sorted in reverse-chronological order        Interpretation of Total Score  Total Score Depression Severity:  1-4 = Minimal depression, 5-9 = Mild depression, 10-14 = Moderate depression, 15-19 = Moderately severe depression, 20-27 = Severe depression   Psychosocial Evaluation and Intervention:  Psychosocial Evaluation - 12/20/23 1143       Psychosocial Evaluation & Interventions   Interventions Stress management education;Relaxation education;Encouraged to exercise with the program  and follow exercise prescription    Comments Patient was referred to CR with NSTEMI/Stent placement. He denies any depression, anxiety or stressors in his life. He says he was a little anxious following his MI but this has resolved. He has OSA and has used a CPAP machine up until 1 year ago and had inspire implanted  and this is working great for him. He sleeps well taking ambien . He has severe restless leg syndrome and takes cymbalta  and gabapentin  for this. He is getting over the flu and says he has some residual SOB and coughing. His wife is his main support person. He is retired as the [employer] for 23 years. He enjoys working with wood and makes furniture. He seems very motivated to participate in the program. His main goals are to improve his SOB and get his strength and stamina back and to get healthy overall. He has no barriers to complete the program identified.    Expected Outcomes Short Term: start the program and attend consistently. Long Term: complete the program meeting his personal goals.    Continue Psychosocial Services  Follow up required by staff             Psychosocial Re-Evaluation:  Psychosocial Re-Evaluation     Row Name 01/11/24 1057 01/20/24 1110 01/25/24 1039         Psychosocial Re-Evaluation   Current issues with Current Sleep Concerns Current Sleep Concerns;Current Stress Concerns;History of Depression Current Sleep Concerns;Current Stress Concerns;History of Depression     Comments [name] is doing well in rehab.  He is generally a positive person and feeling good.  He feels like he is in a good place mentally.  He denies any major stressors other than his health, which he is working on.  He is sleeping decent.  He has good days and rough nights, but has been that way for a while.  He has tried a variety of techniques but has not had success.  He usually has an established bedtime routine and is regular.  They usually watch TV right up until bed, so we  talked about trying to turn that off for a bit before bed.  I sent hm some sleep information for him to review. Reviewed patient health questionnaire (PHQ-9) with patient for follow up. Previously, patients score indicated signs/symptoms of depression.  Reviewed to see if patient is improving symptom wise while in program.  Score improved and patient states that it is because they have been moving more and feeling better overall. He said that his wife feels like he is doing better as well. [name] continues do well in rehab. He still does not sleep well but says that it is his normal. He use to wear a CPAP but now has the implant so he does not have to worry about the CPAP. His health is his only stressor but he said his life has been good and he gets to do his wood working hobby.     Expected Outcomes Short: Review sleep information Long; Continue to exercise for mental boost Short: Continue to exercise for mental boost Long: Continued positivity Short: Continue to exercise for mental boost Long: Continued positivity     Interventions Encouraged to attend Cardiac Rehabilitation for the exercise Encouraged to attend Cardiac Rehabilitation for the exercise Encouraged to attend Cardiac Rehabilitation for the exercise     Continue Psychosocial Services  Follow up required by staff Follow up required by staff Follow up required by staff              Psychosocial Discharge (Final Psychosocial Re-Evaluation):  Psychosocial Re-Evaluation - 01/25/24 1039       Psychosocial Re-Evaluation   Current issues with Current Sleep Concerns;Current Stress Concerns;History of Depression    Comments [name] continues do well in rehab. He still does not sleep well  but says that it is his normal. He use to wear a CPAP but now has the implant so he does not have to worry about the CPAP. His health is his only stressor but he said his life has been good and he gets to do his wood working hobby.    Expected Outcomes Short:  Continue to exercise for mental boost Long: Continued positivity    Interventions Encouraged to attend Cardiac Rehabilitation for the exercise    Continue Psychosocial Services  Follow up required by staff             Vocational Rehabilitation: Provide vocational rehab assistance to qualifying candidates.   Vocational Rehab Evaluation & Intervention:  Vocational Rehab - 12/20/23 1143       Initial Vocational Rehab Evaluation & Intervention   Assessment shows need for Vocational Rehabilitation No      Vocational Rehab Re-Evaulation   Comments Patient is retired.             Education: Education Goals: Education classes will be provided on a weekly basis, covering required topics. Participant will state understanding/return demonstration of topics presented.  Learning Barriers/Preferences:  Learning Barriers/Preferences - 12/21/23 4098       Learning Barriers/Preferences   Learning Barriers Sight   glasses for reading   Learning Preferences Verbal Instruction;Skilled Demonstration             Education Topics: Hypertension, Hypertension Reduction -Define heart disease and high blood pressure. Discus how high blood pressure affects the body and ways to reduce high blood pressure.   Exercise and Your Heart -Discuss why it is important to exercise, the FITT principles of exercise, normal and abnormal responses to exercise, and how to exercise safely. Flowsheet Row CARDIAC REHAB PHASE II EXERCISE from 03/02/2024 in [place] CARDIAC REHABILITATION  Date 12/23/23  Educator HB  Instruction Review Code 1- Verbalizes Understanding       Angina -Discuss definition of angina, causes of angina, treatment of angina, and how to decrease risk of having angina. Flowsheet Row CARDIAC REHAB PHASE II EXERCISE from 03/02/2024 in [place] CARDIAC REHABILITATION  Date 02/03/24  Anthony Bateman and procedures.]  Educator HB  Instruction Review Code 1- Verbalizes Understanding        Cardiac Medications -Review what the following cardiac medications are used for, how they affect the body, and side effects that may occur when taking the medications.  Medications include Aspirin , Beta blockers, calcium  channel blockers, ACE Inhibitors, angiotensin receptor blockers, diuretics, digoxin, and antihyperlipidemics. Flowsheet Row CARDIAC REHAB PHASE II EXERCISE from 03/02/2024 in [place] CARDIAC REHABILITATION  Date 01/19/24  Educator hb  Instruction Review Code 1- Verbalizes Understanding       Congestive Heart Failure -Discuss the definition of CHF, how to live with CHF, the signs and symptoms of CHF, and how keep track of weight and sodium intake. Flowsheet Row CARDIAC REHAB PHASE II EXERCISE from 03/02/2024 in [place] CARDIAC REHABILITATION  Date 01/13/24  Educator HB  Instruction Review Code 1- Verbalizes Understanding       Heart Disease and Intimacy -Discus the effect sexual activity has on the heart, how changes occur during intimacy as we age, and safety during sexual activity. Flowsheet Row CARDIAC REHAB PHASE II EXERCISE from 03/02/2024 in [place] CARDIAC REHABILITATION  Date 02/17/24  Educator HB  Instruction Review Code 1- Verbalizes Understanding       Smoking Cessation / COPD -Discuss different methods to quit smoking, the health benefits of quitting smoking,  and the definition of COPD.   Nutrition I: Fats -Discuss the types of cholesterol, what cholesterol does to the heart, and how cholesterol levels can be controlled. Flowsheet Row CARDIAC REHAB PHASE II EXERCISE from 03/02/2024 in [place] CARDIAC REHABILITATION  Date 12/30/23  Educator HB  Instruction Review Code 1- Verbalizes Understanding       Nutrition II: Labels -Discuss the different components of food labels and how to read food label Flowsheet Row CARDIAC REHAB PHASE II EXERCISE from 03/02/2024 in [place] [facility]  Date 03/02/24  Educator HB  Instruction  Review Code 1- Verbalizes Understanding       Heart Parts/Heart Disease and PAD -Discuss the anatomy of the heart, the pathway of blood circulation through the heart, and these are affected by heart disease.   Stress I: Signs and Symptoms -Discuss the causes of stress, how stress may lead to anxiety and depression, and ways to limit stress. Flowsheet Row CARDIAC REHAB PHASE II EXERCISE from 03/02/2024 in [place] CARDIAC REHABILITATION  Date 01/06/24  Educator HB  Instruction Review Code 1- Verbalizes Understanding       Stress II: Relaxation -Discuss different types of relaxation techniques to limit stress. Flowsheet Row CARDIAC REHAB PHASE II EXERCISE from 03/02/2024 in [place] CARDIAC REHABILITATION  Date 02/24/24  Educator jh  Instruction Review Code 1- Verbalizes Understanding       Warning Signs of Stroke / TIA -Discuss definition of a stroke, what the signs and symptoms are of a stroke, and how to identify when someone is having stroke.   Knowledge Questionnaire Score:  Knowledge Questionnaire Score - 12/21/23 1610       Knowledge Questionnaire Score   Pre Score 18/26             Core Components/Risk Factors/Patient Goals at Admission:  Personal Goals and Risk Factors at Admission - 12/21/23 0922       Core Components/Risk Factors/Patient Goals on Admission    Weight Management Weight Maintenance;Yes;Obesity;Weight Loss    Intervention Weight Management: Develop a combined nutrition and exercise program designed to reach desired caloric intake, while maintaining appropriate intake of nutrient and fiber, sodium and fats, and appropriate energy expenditure required for the weight goal.;Weight Management: Provide education and appropriate resources to help participant work on and attain dietary goals.;Weight Management/Obesity: Establish reasonable short term and long term weight goals.;Obesity: Provide education and appropriate resources to help participant  work on and attain dietary goals.    Admit Weight 203 lb 4.8 oz (92.2 kg)    Goal Weight: Short Term 198 lb (89.8 kg)    Goal Weight: Long Term 198 lb (89.8 kg)    Expected Outcomes Short Term: Continue to assess and modify interventions until short term weight is achieved;Long Term: Adherence to nutrition and physical activity/exercise program aimed toward attainment of established weight goal;Weight Maintenance: Understanding of the daily nutrition guidelines, which includes 25-35% calories from fat, 7% or less cal from saturated fats, less than 200mg  cholesterol, less than 1.5gm of sodium, & 5 or more servings of fruits and vegetables daily;Weight Loss: Understanding of general recommendations for a balanced deficit meal plan, which promotes 1-2 lb weight loss per week and includes a negative energy balance of [phone] kcal/d;Understanding recommendations for meals to include 15-35% energy as protein, 25-35% energy from fat, 35-60% energy from carbohydrates, less than 200mg  of dietary cholesterol, 20-35 gm of total fiber daily;Understanding of distribution of calorie intake throughout the day with the consumption of 4-5 meals/snacks  Improve shortness of breath with ADL's Yes    Intervention Provide education, individualized exercise plan and daily activity instruction to help decrease symptoms of SOB with activities of daily living.    Expected Outcomes Short Term: Improve cardiorespiratory fitness to achieve a reduction of symptoms when performing ADLs;Long Term: Be able to perform more ADLs without symptoms or delay the onset of symptoms    Hypertension Yes    Intervention Provide education on lifestyle modifcations including regular physical activity/exercise, weight management, moderate sodium restriction and increased consumption of fresh fruit, vegetables, and low fat dairy, alcohol moderation, and smoking cessation.;Monitor prescription use compliance.    Expected Outcomes Short Term:  Continued assessment and intervention until BP is < 140/60mm HG in hypertensive participants. < 130/5mm HG in hypertensive participants with diabetes, heart failure or chronic kidney disease.;Long Term: Maintenance of blood pressure at goal levels.    Lipids Yes    Intervention Provide education and support for participant on nutrition & aerobic/resistive exercise along with prescribed medications to achieve LDL 70mg , HDL >40mg .    Expected Outcomes Short Term: Participant states understanding of desired cholesterol values and is compliant with medications prescribed. Participant is following exercise prescription and nutrition guidelines.;Long Term: Cholesterol controlled with medications as prescribed, with individualized exercise RX and with personalized nutrition plan. Value goals: LDL < 70mg , HDL > 40 mg.             Core Components/Risk Factors/Patient Goals Review:   Goals and Risk Factor Review     Row Name 01/11/24 1103 01/25/24 1044           Core Components/Risk Factors/Patient Goals Review   Personal Goals Review Weight Management/Obesity;Hypertension;Improve shortness of breath with ADL's;Lipids Weight Management/Obesity;Hypertension;Improve shortness of breath with ADL's;Lipids      Review [name] is doing well in rehab.  His weight has been steady since being discharged.  He lost a lot of weight in hospital from flu but has been able to maintain it.  He would like to continue to lose more.  He has lost 35 lbs since he retired from being more active.  He has noticed that his breathing has gotten better as he continues to recover from the flu.  His pressures are doing well and he has been checking them at home. [name] continue to do well in rehab. His weight has been steady but he has noticed it hs gone up a lbs ot 2 due to candy bowl. His blood pressure has been good at rehab, he does not check it at home.      Expected Outcomes Short: Conitnue to work on weight loss Long: Continue  to monitor risk factors. Short: Conitnue to work on weight loss Long: Continue to monitor risk factors.               Core Components/Risk Factors/Patient Goals at Discharge (Final Review):   Goals and Risk Factor Review - 01/25/24 1044       Core Components/Risk Factors/Patient Goals Review   Personal Goals Review Weight Management/Obesity;Hypertension;Improve shortness of breath with ADL's;Lipids    Review [name] continue to do well in rehab. His weight has been steady but he has noticed it hs gone up a lbs ot 2 due to candy bowl. His blood pressure has been good at rehab, he does not check it at home.    Expected Outcomes Short: Conitnue to work on weight loss Long: Continue to monitor risk factors.  ITP Comments:  ITP Comments     Row Name 12/20/23 1153 12/21/23 0912 12/22/23 1003 01/19/24 1144 02/16/24 1432   ITP Comments Virtual orientation visit for cardiac rehab with NSTEMI/Stent placement completed. On-site orientation visit scheduled for 12/21/23 at 8:30. Paperwork sent through [employer]. Patient says he will completed prior to arrival. Patient attend orientation today.  Patient is attending Cardiac Rehabilitation Program.  Documentation for diagnosis can be found in [facility] 12/03/23.  Reviewed medical chart, RPE/RPD, gym safety, and program guidelines.  Patient was fitted to equipment they will be using during rehab.  Patient is scheduled to start exercise on Thursday 12/23/23 at 1030.   Initial ITP created and sent for review and signature by Dr. Armida Lander, Medical Director for Cardiac Rehabilitation Program. 30 day review completed. ITP sent to Dr. Armida Lander, Medical Director of Cardiac Rehab. Continue with ITP unless changes are made by physician.  Pt was oriented yesterday. 30 day review completed. ITP sent to Dr. Armida Lander, Medical Director of Cardiac Rehab. Continue with ITP unless changes are made by physician. 30 day review completed. ITP sent to Dr.  Armida Lander, Medical Director of Cardiac Rehab. Continue with ITP unless changes are made by physician.    Row Name 03/15/24 1029           ITP Comments 30 day review completed. ITP sent to Dr. Armida Lander, Medical Director of Cardiac Rehab. Continue with ITP unless changes are made by physician.                Comments: 30 day review

## 2024-03-16 ENCOUNTER — Encounter: Payer: Self-pay | Admitting: Pulmonary Disease

## 2024-03-16 ENCOUNTER — Encounter (HOSPITAL_COMMUNITY)
Admission: RE | Admit: 2024-03-16 | Discharge: 2024-03-16 | Disposition: A | Discharge status: Home / Self Care | Payer: Medicare Other | Source: Ambulatory Visit | Attending: Cardiology | Admitting: Cardiology

## 2024-03-16 DIAGNOSIS — Z955 Presence of coronary angioplasty implant and graft: Secondary | ICD-10-CM

## 2024-03-16 DIAGNOSIS — I214 Non-ST elevation (NSTEMI) myocardial infarction: Secondary | ICD-10-CM | POA: Diagnosis not present

## 2024-03-16 NOTE — Progress Notes (Signed)
 Daily Session Note  Patient Details  Name: [name] MRN: [id number] Date of Birth: 1950-12-17 Referring Provider:   Flowsheet Row CARDIAC REHAB PHASE II ORIENTATION from 12/21/2023 in [facility] CARDIAC REHABILITATION  Referring Provider Carry Clapper MD  [facility] Cardiologist: Dr. Myrtie Atkinson Harding]       Encounter Date: 03/16/2024  Check In:  Session Check In - 03/16/24 1030       Check-In   Supervising physician immediately available to respond to emergencies See telemetry face sheet for immediately available MD    Location [facility]    Staff Present Clotilda Danish, BS, Exercise Physiologist;Jessica Zoila Hines, MA, RCEP, CCRP, CCET    Virtual Visit No    Medication changes reported     No    Fall or balance concerns reported    No    Tobacco Cessation No Change    Warm-up and Cool-down Performed on first and last piece of equipment    Resistance Training Performed Yes    VAD Patient? No    PAD/SET Patient? No      Pain Assessment   Currently in Pain? No/denies    Pain Score 0-No pain    Multiple Pain Sites No             Capillary Blood Glucose: No results found for this or any previous visit (from the past 24 hours).    Social History   Tobacco Use  Smoking Status Former   Types: Cigars   Passive exposure: Never  Smokeless Tobacco Never    Goals Met:  Independence with exercise equipment Exercise tolerated well No report of concerns or symptoms today Strength training completed today  Goals Unmet:  Not Applicable  Comments: Pt able to follow exercise prescription today without complaint.  Will continue to monitor for progression.

## 2024-03-17 DIAGNOSIS — D631 Anemia in chronic kidney disease: Secondary | ICD-10-CM | POA: Diagnosis not present

## 2024-03-17 DIAGNOSIS — E559 Vitamin D deficiency, unspecified: Secondary | ICD-10-CM | POA: Diagnosis not present

## 2024-03-17 DIAGNOSIS — R809 Proteinuria, unspecified: Secondary | ICD-10-CM | POA: Diagnosis not present

## 2024-03-17 DIAGNOSIS — N189 Chronic kidney disease, unspecified: Secondary | ICD-10-CM | POA: Diagnosis not present

## 2024-03-17 DIAGNOSIS — E211 Secondary hyperparathyroidism, not elsewhere classified: Secondary | ICD-10-CM | POA: Diagnosis not present

## 2024-03-21 ENCOUNTER — Encounter (HOSPITAL_COMMUNITY)
Admission: RE | Admit: 2024-03-21 | Discharge: 2024-03-21 | Disposition: A | Discharge status: Home / Self Care | Payer: Medicare Other | Source: Ambulatory Visit | Attending: Cardiology | Admitting: Cardiology

## 2024-03-21 ENCOUNTER — Ambulatory Visit (INDEPENDENT_AMBULATORY_CARE_PROVIDER_SITE_OTHER): Payer: Medicare Other | Admitting: Internal Medicine

## 2024-03-21 ENCOUNTER — Encounter: Payer: Self-pay | Admitting: Internal Medicine

## 2024-03-21 VITALS — BP 124/70 | HR 86 | Ht 68.0 in | Wt 201.4 lb

## 2024-03-21 DIAGNOSIS — Z955 Presence of coronary angioplasty implant and graft: Secondary | ICD-10-CM | POA: Diagnosis not present

## 2024-03-21 DIAGNOSIS — G4700 Insomnia, unspecified: Secondary | ICD-10-CM | POA: Diagnosis not present

## 2024-03-21 DIAGNOSIS — I214 Non-ST elevation (NSTEMI) myocardial infarction: Secondary | ICD-10-CM | POA: Diagnosis not present

## 2024-03-21 DIAGNOSIS — Z9861 Coronary angioplasty status: Secondary | ICD-10-CM

## 2024-03-21 DIAGNOSIS — E782 Mixed hyperlipidemia: Secondary | ICD-10-CM | POA: Diagnosis not present

## 2024-03-21 DIAGNOSIS — G2581 Restless legs syndrome: Secondary | ICD-10-CM | POA: Diagnosis not present

## 2024-03-21 DIAGNOSIS — I2510 Atherosclerotic heart disease of native coronary artery without angina pectoris: Secondary | ICD-10-CM | POA: Diagnosis not present

## 2024-03-21 DIAGNOSIS — N1831 Chronic kidney disease, stage 3a: Secondary | ICD-10-CM

## 2024-03-21 DIAGNOSIS — Z23 Encounter for immunization: Secondary | ICD-10-CM | POA: Insufficient documentation

## 2024-03-21 DIAGNOSIS — I10 Essential (primary) hypertension: Secondary | ICD-10-CM | POA: Diagnosis not present

## 2024-03-21 NOTE — Progress Notes (Signed)
 Daily Session Note  Patient Details  Name: [name] MRN: [id number] Date of Birth: 1951/06/08 Referring Provider:   Flowsheet Row CARDIAC REHAB PHASE II ORIENTATION from 12/21/2023 in [facility] CARDIAC REHABILITATION  Referring Provider Carry Clapper MD  [facility] Cardiologist: Dr. Myrtie Atkinson Harding]       Encounter Date: 03/21/2024  Check In:  Session Check In - 03/21/24 1030       Check-In   Supervising physician immediately available to respond to emergencies See telemetry face sheet for immediately available MD    Location [facility]    Staff Present Clotilda Danish, BS, Exercise Physiologist;Daris Harkins Zoila Hines, MA, RCEP, CCRP, CCET;Jefferey Minerva, RN    Virtual Visit No    Medication changes reported     No    Fall or balance concerns reported    No    Warm-up and Cool-down Performed on first and last piece of equipment    Resistance Training Performed Yes    VAD Patient? No    PAD/SET Patient? No      Pain Assessment   Currently in Pain? No/denies             Capillary Blood Glucose: No results found for this or any previous visit (from the past 24 hours).    Social History   Tobacco Use  Smoking Status Former   Types: Cigars   Passive exposure: Never  Smokeless Tobacco Never    Goals Met:  Independence with exercise equipment Exercise tolerated well No report of concerns or symptoms today Strength training completed today  Goals Unmet:  Not Applicable  Comments: Pt able to follow exercise prescription today without complaint.  Will continue to monitor for progression.

## 2024-03-21 NOTE — Patient Instructions (Signed)
 It was a pleasure to see you today.  Thank you for giving us  the opportunity to be involved in your care.  Below is a brief recap of your visit and next steps.  We will plan to see you again in 6 months.  Summary No medication changes today Pneumonia vaccine administered Follow up in 6 months

## 2024-03-21 NOTE — Assessment & Plan Note (Signed)
 Followed by nephrology (Dr. Carrolyn Clan).  He completed labs yesterday and is scheduled for follow-up next month.

## 2024-03-21 NOTE — Assessment & Plan Note (Signed)
 Adequately controlled on current antihypertensive regimen.  No medication changes are indicated today.

## 2024-03-21 NOTE — Assessment & Plan Note (Signed)
PCV 20 administered today 

## 2024-03-21 NOTE — Progress Notes (Signed)
 Established Patient Office Visit  Subjective   Patient ID: [name], male    DOB: 1951/10/25  Age: 73 y.o. MRN: [id number]  Chief Complaint  Patient presents with   Care Management    Three month follow up    [name] returns to care today for routine follow-up.  He was last evaluated by me on 2/21 as a new patient presenting to establish care.  I prescribed Repatha  in the setting of known cardiovascular disease with a documented history of statin intolerance as recommended per cardiology.  A neurology referral was placed at his request in the setting of poorly controlled RLS.  No additional medication changes were made and 53-month follow-up was arranged for reassessment.  In the interim he has attended cardiac rehab.  There have otherwise been no acute interval events. [name] reports feeling fairly well today.  His chief concern remains poorly controlled RLS symptoms.  He has previously been referred to a different neurology office but has not been able to establish care.  His sleep medicine physician has recently suggested low-dose opioids.  He is asymptomatic currently.  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   Sleep apnea    uses Cpap nightly   Past Surgical History:  Procedure Laterality Date   BACK SURGERY     lumbar 2020   CATARACT EXTRACTION     10-15 years ago   COLONOSCOPY  05/25/2012   Procedure: COLONOSCOPY;  Surgeon: Ruby Corporal, MD;  Location: [facility];  Service: Endoscopy;  Laterality: N/A;  1030   COLONOSCOPY N/A 05/14/2017   Procedure: COLONOSCOPY;  Surgeon: Ruby Corporal, MD;  Location: [facility];  Service: Endoscopy;  Laterality: N/A;  10:10   CORONARY PRESSURE/FFR WITH 3D MAPPING N/A  12/06/2023   Procedure: Coronary Pressure/FFR w/3D Mapping;  Surgeon: Arleen Lacer, MD;  Location: [facility] INVASIVE CV LAB;  Service: Cardiovascular;  Laterality: N/A;   CORONARY STENT INTERVENTION N/A 12/06/2023   Procedure: CORONARY STENT INTERVENTION;  Surgeon: Arleen Lacer, MD;  Location: [facility] INVASIVE CV LAB;  Service: Cardiovascular;  Laterality: N/A;   CYSTOSCOPY/RETROGRADE/URETEROSCOPY Right 06/07/2014   Procedure: CYSTOSCOPY, RIGHT RETROGRADE, RIGHT URETEROSCOPY, RIGHT URETERAL BALLOON DILATATION, BIOPSY, RIGHT URETERAL STENT;  Surgeon: Roque Collar, MD;  Location: [facility];  Service: Urology;  Laterality: Right;   DRUG INDUCED ENDOSCOPY Right 07/21/2022   Procedure: DRUG INDUCED ENDOSCOPY;  Surgeon: Virgina Grills, MD;  Location: [facility];  Service: ENT;  Laterality: Right;   I & D EXTREMITY Left 05/01/2021   Procedure: LEFT INDEX AND LONG FINGER AMPUTATION AND RING FINGER NAIL BED REPAIR.;  Surgeon: Brunilda Capra, MD;  Location: [facility];  Service: Orthopedics;  Laterality: Left;  LEFT INDEX AND LONG FINGER AMPUTATION AND RING FINGER NAIL BED REPAIR.   IMPLANTATION OF HYPOGLOSSAL NERVE STIMULATOR Right 09/09/2022   Procedure: IMPLANTATION OF HYPOGLOSSAL NERVE STIMULATOR;  Surgeon: Virgina Grills, MD;  Location: [facility];  Service: ENT;  Laterality: Right;   kidney stone  1983   Had partial right nephrectomy   KNEE ARTHROSCOPY WITH MEDIAL MENISECTOMY Right 04/14/2018   Procedure: KNEE ARTHROSCOPY WITH MEDIAL  MENISECTOMY;  Surgeon: Darrin Emerald, MD;  Location: [facility];  Service: Orthopedics;  Laterality: Right;   left foot surgery  2010   repair of joint   LEFT HEART CATH AND CORONARY ANGIOGRAPHY N/A 12/06/2023   Procedure: LEFT HEART CATH AND CORONARY ANGIOGRAPHY;  Surgeon: Arleen Lacer, MD;  Location: [facility] INVASIVE CV LAB;  Service: Cardiovascular;  Laterality: N/A;   PARTIAL KNEE ARTHROPLASTY Left 09/16/2021   Procedure:  UNICOMPARTMENTAL KNEE MEDIALLY;  Surgeon: Claiborne Crew, MD;  Location: [facility];  Service: Orthopedics;  Laterality: Left;   WRIST SURGERY Bilateral    Social History   Tobacco Use   Smoking status: Former    Types: Cigars    Passive exposure: Never   Smokeless tobacco: Never  Vaping Use   Vaping status: Never Used  Substance Use Topics   Alcohol use: Yes    Comment: rare   Drug use: No   Family History  Problem Relation Age of Onset   Cancer Father        Bladder cancer   Lung cancer Brother    Healthy Mother    Healthy Brother    Healthy Brother    Allergies  Allergen Reactions   Statins Other (See Comments)    Muscle pain   Nsaids Other (See Comments)    "Cannot take because of my kidney disease."   Review of Systems  Constitutional:  Negative for chills and fever.  HENT:  Negative for sore throat.   Respiratory:  Negative for cough and shortness of breath.   Cardiovascular:  Negative for chest pain, palpitations and leg swelling.  Gastrointestinal:  Negative for abdominal pain, blood in stool, constipation, diarrhea, nausea and vomiting.  Genitourinary:  Negative for dysuria and hematuria.  Musculoskeletal:  Negative for myalgias.  Skin:  Negative for itching and rash.  Neurological:  Negative for dizziness and headaches.  Psychiatric/Behavioral:  Negative for depression and suicidal ideas.      Objective:     BP 124/70   Pulse 86   Ht 5\' 8"  (1.727 m)   Wt 201 lb 6.4 oz (91.4 kg)   SpO2 98%   BMI 30.62 kg/m  BP Readings from Last 3 Encounters:  03/21/24 124/70  03/14/24 (!) 140/82  12/20/23 (!) 142/86   Physical Exam Vitals reviewed.  Constitutional:      General: He is not in acute distress.    Appearance: Normal appearance. He is obese. He is not ill-appearing.  HENT:     Head: Normocephalic and atraumatic.     Right Ear: External ear normal.     Left Ear: External ear normal.     Nose: Nose normal. No congestion or rhinorrhea.      Mouth/Throat:     Mouth: Mucous membranes are moist.     Pharynx: Oropharynx is clear.  Eyes:     General: No scleral icterus.    Extraocular Movements: Extraocular movements intact.     Conjunctiva/sclera: Conjunctivae normal.     Pupils: Pupils are equal, round, and reactive to light.  Cardiovascular:     Rate and Rhythm: Normal rate and regular rhythm.     Pulses: Normal pulses.     Heart sounds: Normal heart sounds. No murmur heard. Pulmonary:     Effort: Pulmonary effort is normal.     Breath sounds: Normal breath sounds. No wheezing, rhonchi or rales.  Abdominal:     General: Abdomen is flat. Bowel sounds are normal. There is no distension.  Palpations: Abdomen is soft.     Tenderness: There is no abdominal tenderness.  Musculoskeletal:        General: No swelling or deformity. Normal range of motion.     Cervical back: Normal range of motion.  Skin:    General: Skin is warm and dry.     Capillary Refill: Capillary refill takes less than 2 seconds.  Neurological:     General: No focal deficit present.     Mental Status: He is alert and oriented to person, place, and time.     Motor: No weakness.  Psychiatric:        Mood and Affect: Mood normal.        Behavior: Behavior normal.        Thought Content: Thought content normal.   Last CBC Lab Results  Component Value Date   WBC 5.8 12/07/2023   HGB 11.1 (L) 12/07/2023   HCT 32.9 (L) 12/07/2023   MCV 89.9 12/07/2023   MCH 30.3 12/07/2023   RDW 12.9 12/07/2023   PLT 140 (L) 12/07/2023   Last metabolic panel Lab Results  Component Value Date   GLUCOSE 103 (H) 12/07/2023   NA 138 12/07/2023   K 4.0 12/07/2023   CL 105 12/07/2023   CO2 23 12/07/2023   BUN 17 12/07/2023   CREATININE 1.45 (H) 12/07/2023   GFRNONAA 51 (L) 12/07/2023   CALCIUM  8.7 (L) 12/07/2023   PHOS 3.1 12/07/2023   PROT 6.2 (L) 12/06/2023   ALBUMIN 3.4 (L) 12/07/2023   LABGLOB 2.9 12/07/2016   BILITOT 0.7 12/06/2023   ALKPHOS 44  12/06/2023   AST 160 (H) 12/06/2023   ALT 44 12/06/2023   ANIONGAP 10 12/07/2023   Last lipids Lab Results  Component Value Date   CHOL 112 02/10/2024   HDL 51 02/10/2024   LDLCALC 45 02/10/2024   TRIG 77 02/10/2024   CHOLHDL 2.2 02/10/2024   Last hemoglobin A1c Lab Results  Component Value Date   HGBA1C 5.4 12/05/2023   Last vitamin B12 and Folate Lab Results  Component Value Date   VITAMINB12 720 12/07/2016     Assessment & Plan:   Problem List Items Addressed This Visit       Essential (primary) hypertension (Chronic)   Adequately controlled on current antihypertensive regimen.  No medication changes are indicated today.      CAD S/P percutaneous coronary angioplasty   Denies recent chest pain.  Hospital admission for NSTEMI in early February.  Underwent successful PCI with stenting of a 90% D1 lesion.  He remains on DAPT (ASA/Effient ) and Toprol -XL.  Repatha  was started at his last appointment, which he has tolerated well and lipid panel updated last month reflects significant improvement.  Cardiology follow-up is scheduled for next month.  He will complete cardiac rehab this Thursday (5/29).      Stage 3 chronic kidney disease (HCC) (Chronic)   Followed by nephrology (Dr. Carrolyn Clan).  He completed labs yesterday and is scheduled for follow-up next month.      Restless leg syndrome   His chief concern today remains poorly controlled symptoms of RLS.  He is currently prescribed pramipexole  0.5 mg nightly and gabapentin  300 mg 3 times daily.  He is attempting to establish care with a new neurologist.  We will follow-up on the status of this referral today.  We also discussed increasing his evening dose of gabapentin  for improved symptom control.  He has also recently discussed low-dose opioids for management of severe episodes  with his sleep medicine provider.      Insomnia   Nightly use of Ambien  remains effective for management of insomnia.  PDMP reviewed and is  appropriate.      Hyperlipidemia   Repatha  was started at his last appointment in the setting of known cardiovascular disease with a documented history of statin intolerance.  Lipid panel updated 4/17 shows total cholesterol 112 and LDL improved to 45.      Need for pneumococcal 20-valent conjugate vaccination   PCV 20 administered today      Return in about 6 months (around 09/21/2024).   Tobi Fortes, MD

## 2024-03-21 NOTE — Assessment & Plan Note (Signed)
 Nightly use of Ambien  remains effective for management of insomnia.  PDMP reviewed and is appropriate.

## 2024-03-21 NOTE — Assessment & Plan Note (Signed)
 His chief concern today remains poorly controlled symptoms of RLS.  He is currently prescribed pramipexole  0.5 mg nightly and gabapentin  300 mg 3 times daily.  He is attempting to establish care with a new neurologist.  We will follow-up on the status of this referral today.  We also discussed increasing his evening dose of gabapentin  for improved symptom control.  He has also recently discussed low-dose opioids for management of severe episodes with his sleep medicine provider.

## 2024-03-21 NOTE — Assessment & Plan Note (Signed)
 Repatha  was started at his last appointment in the setting of known cardiovascular disease with a documented history of statin intolerance.  Lipid panel updated 4/17 shows total cholesterol 112 and LDL improved to 45.

## 2024-03-21 NOTE — Assessment & Plan Note (Signed)
 Denies recent chest pain.  Hospital admission for NSTEMI in early February.  Underwent successful PCI with stenting of a 90% D1 lesion.  He remains on DAPT (ASA/Effient ) and Toprol -XL.  Repatha  was started at his last appointment, which he has tolerated well and lipid panel updated last month reflects significant improvement.  Cardiology follow-up is scheduled for next month.  He will complete cardiac rehab this Thursday (5/29).

## 2024-03-23 ENCOUNTER — Encounter (HOSPITAL_COMMUNITY)
Admission: RE | Admit: 2024-03-23 | Discharge: 2024-03-23 | Disposition: A | Discharge status: Home / Self Care | Payer: Medicare Other | Source: Ambulatory Visit | Attending: Cardiology

## 2024-03-23 DIAGNOSIS — Z955 Presence of coronary angioplasty implant and graft: Secondary | ICD-10-CM

## 2024-03-23 DIAGNOSIS — M1711 Unilateral primary osteoarthritis, right knee: Secondary | ICD-10-CM | POA: Diagnosis not present

## 2024-03-23 DIAGNOSIS — I214 Non-ST elevation (NSTEMI) myocardial infarction: Secondary | ICD-10-CM

## 2024-03-23 MED ORDER — TRAMADOL HCL 50 MG PO TABS: 50.0000 mg | ORAL_TABLET | Freq: Every day | ORAL | 0 refills | Status: DC | PRN

## 2024-03-23 NOTE — Addendum Note (Signed)
 Addended by: Kato Wieczorek E on: 03/23/2024 11:40 AM   Modules accepted: Orders

## 2024-03-23 NOTE — Progress Notes (Signed)
 Cardiac Individual Treatment Plan  Patient Details  Name: [name] MRN: [id number] Date of Birth: 1950/12/06 Referring Provider:   Flowsheet Row CARDIAC REHAB PHASE II ORIENTATION from 12/21/2023 in [facility] CARDIAC REHABILITATION  Referring Provider Carry Clapper MD  [facility] Cardiologist: Dr. Myrtie Atkinson [place]]       Initial Encounter Date:  Flowsheet Row CARDIAC REHAB PHASE II ORIENTATION from 12/21/2023 in [place] CARDIAC REHABILITATION  Date 12/21/23       Visit Diagnosis: NSTEMI (non-ST elevated myocardial infarction) [facility])  Status post coronary artery stent placement  Patient's Home Medications on Admission:  Current Outpatient Medications:    aspirin  EC 81 MG tablet, Take 1 tablet (81 mg total) by mouth daily. Swallow whole., Disp: , Rfl:    DULoxetine  (CYMBALTA ) 30 MG capsule, TAKE TWO CAPSULES BY MOUTH EVERY DAY, Disp: 60 capsule, Rfl: 2   Evolocumab  (REPATHA  SURECLICK) 140 MG/ML SOAJ, Inject 140 mg into the skin every 14 (fourteen) days., Disp: 2 mL, Rfl: 2   gabapentin  (NEURONTIN ) 300 MG capsule, Take 300 mg by mouth See admin instructions. One tablet in the morning and afternoon and two tablets at bedtime, Disp: , Rfl:    metoprolol  succinate (TOPROL -XL) 25 MG 24 hr tablet, Take 0.5 tablets (12.5 mg total) by mouth daily., Disp: 45 tablet, Rfl: 0   nitroGLYCERIN  (NITROSTAT ) 0.4 MG SL tablet, Place 1 tablet (0.4 mg total) under the tongue every 5 (five) minutes as needed for chest pain., Disp: 25 tablet, Rfl: 11   pramipexole  (MIRAPEX ) 0.5 MG tablet, TAKE 1 TABLET AT DINNER AND 1 TABLET AT BEDTIME., Disp: 180 tablet, Rfl: 0   prasugrel  (EFFIENT ) 10 MG TABS tablet, Take 1 tablet (10 mg total) by mouth daily., Disp: 180 tablet, Rfl: 0   zolpidem  (AMBIEN ) 10 MG tablet, Take 1 tablet (10 mg total) by mouth at bedtime., Disp: 30 tablet, Rfl: 0  Past Medical History: Past Medical History:  Diagnosis Date   Anemia    Anxiety    Arthritis    spinal stenosis    Depression    History of kidney stones    Hyperlipidemia    Hypertension    Kidney function abnormal 1983   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   Sleep apnea    uses Cpap nightly    Tobacco Use: Social History   Tobacco Use  Smoking Status Former   Types: Cigars   Passive exposure: Never  Smokeless Tobacco Never    Labs: Review Flowsheet       Latest Ref Rng & Units 05/14/2017 12/05/2023 02/10/2024  Labs for ITP Cardiac and Pulmonary Rehab  Cholestrol 100 - 199 mg/dL 403  474  259   LDL (calc) 0 - 99 mg/dL 563  875  45   HDL-C >64 mg/dL 29  38  51   Trlycerides 0 - 149 mg/dL 332  58  77   Hemoglobin A1c 4.8 - 5.6 % - 5.4  -    Capillary Blood Glucose: No results found for: "GLUCAP"   Exercise Target Goals: Exercise Program Goal: Individual exercise prescription set using results from initial 6 min walk test and THRR while considering  patient's activity barriers and safety.   Exercise Prescription Goal: Starting with aerobic activity 30 plus minutes a day, 3 days per week for initial exercise prescription. Provide home exercise prescription and guidelines that participant acknowledges understanding prior to discharge.  Activity Barriers & Risk Stratification:  Activity Barriers &  Cardiac Risk Stratification - 12/21/23 0913       Activity Barriers & Cardiac Risk Stratification   Activity Barriers Left Knee Replacement;Deconditioning;Shortness of Breath;Balance Concerns;Arthritis;Neck/Spine Problems;Joint Problems;Muscular Weakness   hx spinal stenosis, arthritis all over, bilateral knee surgeries (occassional pain)   Cardiac Risk Stratification Moderate             6 Minute Walk:  6 Minute Walk     Row Name 12/21/23 0913 03/13/24 1115       6 Minute Walk   Phase Initial Discharge    Distance 1325 feet 1570 feet    Distance Feet Change -- 242 ft    Walk Time 6 minutes 6 minutes    # of Rest Breaks 0 0     MPH 2.51 2.97    METS 2.76 3.24    RPE 7 11    Perceived Dyspnea  -- 0    VO2 Peak 9.67 11.33    Symptoms No No    Resting HR 78 bpm 87 bpm    Resting BP 104/62 120/60    Resting Oxygen Saturation  97 % 96 %    Exercise Oxygen Saturation  during 6 min walk 98 % 96 %    Max Ex. HR 103 bpm 113 bpm    Max Ex. BP 148/66 140/68    2 Minute Post BP 114/62 126/64             Oxygen Initial Assessment:   Oxygen Re-Evaluation:   Oxygen Discharge (Final Oxygen Re-Evaluation):   Initial Exercise Prescription:  Initial Exercise Prescription - 12/21/23 0900       Date of Initial Exercise RX and Referring Provider   Date 12/21/23    Referring Provider Carry Clapper MD   Primary Cardiologist: Dr. Randene Bustard     Oxygen   Maintain Oxygen Saturation 88% or higher      Treadmill   MPH 2.2    Grade 0.5    Minutes 15    METs 2.84      NuStep   Level 3    SPM 80    Minutes 15    METs 2.5      Prescription Details   Frequency (times per week) 2    Duration Progress to 30 minutes of continuous aerobic without signs/symptoms of physical distress      Intensity   THRR 40-80% of Max Heartrate 105-132    Ratings of Perceived Exertion 11-13    Perceived Dyspnea 0-4      Progression   Progression Continue to progress workloads to maintain intensity without signs/symptoms of physical distress.      Resistance Training   Training Prescription Yes    Weight 5 lb    Reps 10-15             Perform Capillary Blood Glucose checks as needed.  Exercise Prescription Changes:   Exercise Prescription Changes     Row Name 12/21/23 0900 12/30/23 1200 01/11/24 1000 02/01/24 1500 02/29/24 1500     Response to Exercise   Blood Pressure (Admit) 104/62 110/66 -- 110/70 126/62   Blood Pressure (Exercise) 148/66 146/72 -- -- --   Blood Pressure (Exit) 114/62 118/60 -- 122/78 124/60   Heart Rate (Admit) 78 bpm 76 bpm -- 85 bpm 83 bpm   Heart Rate (Exercise) 103 bpm 117 bpm --  121 bpm 117 bpm   Heart Rate (Exit) 80 bpm 88 bpm -- 93 bpm 92 bpm   Oxygen  Saturation (Admit) 97 % -- -- -- --   Oxygen Saturation (Exercise) 98 % -- -- -- --   Rating of Perceived Exertion (Exercise) 7 11 -- 13 13   Symptoms none -- -- -- --   Comments walk test results -- -- -- --   Duration -- Continue with 30 min of aerobic exercise without signs/symptoms of physical distress. -- Continue with 30 min of aerobic exercise without signs/symptoms of physical distress. Continue with 30 min of aerobic exercise without signs/symptoms of physical distress.   Intensity -- THRR unchanged -- THRR unchanged THRR unchanged     Progression   Progression -- Continue to progress workloads to maintain intensity without signs/symptoms of physical distress. -- Continue to progress workloads to maintain intensity without signs/symptoms of physical distress. Continue to progress workloads to maintain intensity without signs/symptoms of physical distress.     Resistance Training   Training Prescription -- Yes -- Yes Yes   Weight -- 5 -- 5 5   Reps -- 10-15 -- 10-15 10-15     Treadmill   MPH -- 2.5 -- 3 2.8   Grade -- 1 -- 2 2   Minutes -- 15 -- 15 15   METs -- 3.26 -- 4.12 3.91     NuStep   Level -- 3 -- 3 4   SPM -- 99 -- 114 106   Minutes -- 15 -- 15 15   METs -- 2.4 -- 3.4 3.3     Home Exercise Plan   Plans to continue exercise at -- -- Home (comment)  walking, staff videos Home (comment) Home (comment)   Frequency -- -- Add 3 additional days to program exercise sessions. Add 3 additional days to program exercise sessions. Add 3 additional days to program exercise sessions.   Initial Home Exercises Provided -- -- 01/11/24 -- --    Row Name 03/16/24 1500             Response to Exercise   Blood Pressure (Admit) 132/72       Blood Pressure (Exit) 130/80       Heart Rate (Admit) 69 bpm       Heart Rate (Exercise) 119 bpm       Heart Rate (Exit) 81 bpm       Rating of Perceived Exertion  (Exercise) 12       Duration Continue with 30 min of aerobic exercise without signs/symptoms of physical distress.       Intensity THRR unchanged         Progression   Progression Continue to progress workloads to maintain intensity without signs/symptoms of physical distress.         Resistance Training   Training Prescription Yes       Weight 5       Reps 10-15         Treadmill   MPH 2.5       Grade 2       Minutes 15       METs 3.6         NuStep   Level 4       SPM 124       Minutes 15       METs 4.4         Home Exercise Plan   Plans to continue exercise at Home (comment)       Frequency Add 3 additional days to program exercise sessions.  Exercise Comments:   Exercise Comments     Row Name 03/23/24 1031           Exercise Comments [name] graduated today from  rehab with 36 sessions completed.  Details of the patient's exercise prescription and what He needs to do in order to continue the prescription and progress were discussed with patient.  Patient was given a copy of prescription and goals.  Patient verbalized understanding. Zanden plans to continue to exercise by going to the gym .                Exercise Goals and Review:   Exercise Goals     Row Name 12/21/23 [phone]             Exercise Goals   Increase Physical Activity Yes       Intervention Provide advice, education, support and counseling about physical activity/exercise needs.;Develop an individualized exercise prescription for aerobic and resistive training based on initial evaluation findings, risk stratification, comorbidities and participant's personal goals.       Expected Outcomes Long Term: Add in home exercise to make exercise part of routine and to increase amount of physical activity.;Long Term: Exercising regularly at least 3-5 days a week.;Short Term: Attend rehab on a regular basis to increase amount of physical activity.       Increase Strength and Stamina  Yes       Intervention Provide advice, education, support and counseling about physical activity/exercise needs.;Develop an individualized exercise prescription for aerobic and resistive training based on initial evaluation findings, risk stratification, comorbidities and participant's personal goals.       Expected Outcomes Short Term: Increase workloads from initial exercise prescription for resistance, speed, and METs.;Short Term: Perform resistance training exercises routinely during rehab and add in resistance training at home;Long Term: Improve cardiorespiratory fitness, muscular endurance and strength as measured by increased METs and functional capacity ( )       Able to understand and use rate of perceived exertion (RPE) scale Yes       Intervention Provide education and explanation on how to use RPE scale       Expected Outcomes Short Term: Able to use RPE daily in rehab to express subjective intensity level;Long Term:  Able to use RPE to guide intensity level when exercising independently       Able to understand and use Dyspnea scale Yes       Intervention Provide education and explanation on how to use Dyspnea scale       Expected Outcomes Short Term: Able to use Dyspnea scale daily in rehab to express subjective sense of shortness of breath during exertion;Long Term: Able to use Dyspnea scale to guide intensity level when exercising independently       Knowledge and understanding of Target Heart Rate Range (THRR) Yes       Intervention Provide education and explanation of THRR including how the numbers were predicted and where they are located for reference       Expected Outcomes Short Term: Able to state/look up THRR;Long Term: Able to use THRR to govern intensity when exercising independently;Short Term: Able to use daily as guideline for intensity in rehab       Able to check pulse independently Yes       Intervention Provide education and demonstration on how to check pulse in  carotid and radial arteries.;Review the importance of being able to check your own pulse for safety during independent exercise  Expected Outcomes Short Term: Able to explain why pulse checking is important during independent exercise;Long Term: Able to check pulse independently and accurately       Understanding of Exercise Prescription Yes       Intervention Provide education, explanation, and written materials on patient's individual exercise prescription       Expected Outcomes Short Term: Able to explain program exercise prescription;Long Term: Able to explain home exercise prescription to exercise independently                Exercise Goals Re-Evaluation :  Exercise Goals Re-Evaluation     Row Name 01/03/24 1219 01/11/24 1055 01/25/24 1036 02/02/24 1318 03/13/24 1347     Exercise Goal Re-Evaluation   Exercise Goals Review Increase Physical Activity;Increase Strength and Stamina;Understanding of Exercise Prescription Increase Physical Activity;Increase Strength and Stamina;Able to understand and use rate of perceived exertion (RPE) scale;Knowledge and understanding of Target Heart Rate Range (THRR);Able to understand and use Dyspnea scale;Able to check pulse independently;Understanding of Exercise Prescription Increase Physical Activity;Increase Strength and Stamina;Understanding of Exercise Prescription -- Increase Physical Activity;Increase Strength and Stamina;Understanding of Exercise Prescription   Comments [name] is doing well in rehab and is on his 6th visit. He has increased his speed on the treadmill to 2.5. He is execising at an RPE of 11 on both sets of equipment and is wanting to increase to push himsl;ef. Will continue to monitor and progress as able. [name] is doing well in rehab. He is feeling good and walking some around house and at store.  Reviewed home exercise with pt today.  Pt plans to walk at home for exercise.  We also talked about using staff videos on bad weather  days. Reviewed THR, pulse, RPE, sign and symptoms, pulse oximetery and when to call 911 or MD.  Also discussed weather considerations and indoor options.  Pt voiced understanding. [name] is doing great with exercise. He continues to feel good and is doing yard work around [employer]. He is doing his wood working as well. He doesnt get as wrn out as he use to. [name] is doing well in rehab and is on his 19th visit. He is increasing his speed on the treadmill and level on the nustep. He is at a walking speed of 3 mph with a grade of 2 and level 3 on the nustoe Will continue to monitor and progress as able [name] is doing well in rehab. He is almost done with the program and has been increasing his walking levels on the treadmill. He is walking at home and plans to continue to walk when he is done with the program. He stated that he has more energy since starting the program. He did his post walk test today and went 228ft longer then the first visit.   Expected Outcomes Continue to come to reah Short: Start to add in more exercise at home on off days Long: Continue to improve stamina Short: Start to add in more exercise at home on off days Long: Continue to improve stamina continue to attend rehab Short: continue to attend reha to the end  long : continue to exercise at [facility]             Discharge Exercise Prescription (Final Exercise Prescription Changes):  Exercise Prescription Changes - 03/16/24 1500       Response to Exercise   Blood Pressure (Admit) 132/72    Blood Pressure (Exit) 130/80    Heart Rate (Admit) 69 bpm  Heart Rate (Exercise) 119 bpm    Heart Rate (Exit) 81 bpm    Rating of Perceived Exertion (Exercise) 12    Duration Continue with 30 min of aerobic exercise without signs/symptoms of physical distress.    Intensity THRR unchanged      Progression   Progression Continue to progress workloads to maintain intensity without signs/symptoms of physical distress.      Resistance  Training   Training Prescription Yes    Weight 5    Reps 10-15      Treadmill   MPH 2.5    Grade 2    Minutes 15    METs 3.6      NuStep   Level 4    SPM 124    Minutes 15    METs 4.4      Home Exercise Plan   Plans to continue exercise at Home (comment)    Frequency Add 3 additional days to program exercise sessions.             Nutrition:  Target Goals: Understanding of nutrition guidelines, daily intake of sodium 1500mg , cholesterol 200mg , calories 30% from fat and 7% or less from saturated fats, daily to have 5 or more servings of fruits and vegetables.  Biometrics:  Pre Biometrics - 12/21/23 0917       Pre Biometrics   Height 5\' 9"  (1.753 m)    Weight 92.2 kg    Waist Circumference 39 inches    Hip Circumference 40 inches    Waist to Hip Ratio 0.98 %    BMI (Calculated) 30.01    Grip Strength 43.2 kg    Single Leg Stand 18.4 seconds             Post Biometrics - 03/13/24 1115        Post  Biometrics   Height 5\' 9"  (1.753 m)    Weight 92 kg    Waist Circumference 40 inches    Hip Circumference 40 inches    Waist to Hip Ratio 1 %    BMI (Calculated) 29.94    Grip Strength 44.9 kg    Single Leg Stand 24.6 seconds             Nutrition Therapy Plan and Nutrition Goals:  Nutrition Therapy & Goals - 12/21/23 1526       Intervention Plan   Intervention Prescribe, educate and counsel regarding individualized specific dietary modifications aiming towards targeted core components such as weight, hypertension, lipid management, diabetes, heart failure and other comorbidities.;Nutrition handout(s) given to patient.    Expected Outcomes Short Term Goal: Understand basic principles of dietary content, such as calories, fat, sodium, cholesterol and nutrients.;Long Term Goal: Adherence to prescribed nutrition plan.             Nutrition Assessments:  MEDIFICTS Score Key: >=70 Need to make dietary changes  40-70 Heart Healthy Diet <= 40  Therapeutic Level Cholesterol Diet  Flowsheet Row CARDIAC REHAB PHASE II EXERCISE from 03/21/2024 in [facility] CARDIAC REHABILITATION  Picture Your Plate Total Score on Admission 61  Picture Your Plate Total Score on Discharge 60      Picture Your Plate Scores: <25 Unhealthy dietary pattern with much room for improvement. 41-50 Dietary pattern unlikely to meet recommendations for good health and room for improvement. 51-60 More healthful dietary pattern, with some room for improvement.  >60 Healthy dietary pattern, although there may be some specific behaviors that could be improved.    Nutrition  Goals Re-Evaluation:  Nutrition Goals Re-Evaluation     Row Name 01/11/24 1102 01/25/24 1041           Goals   Nutrition Goal Heart Healthy Diet Heart Healthy Diet      Comment [name] is doing well in rehab.  He has been working hard on his diet.  He is now eating more fruits and vegetables than he ever used to.  He is trying to stick to lean meat and staying away from red meat as much as possible.  He has never been a big salt person so that part has been easy for him. [name] is doing well in rehab. He said his diet is better then it has been. He does like sweets and enjoys having it now and then. His wife and him are trying to choose heart healthy diet.      Expected Outcome Short: Continue to focus on more fruits and vegetables Long: Continue to eat heart healthy Continue to work on diet and eating more fruits and veggies and eating heart healthy               Nutrition Goals Discharge (Final Nutrition Goals Re-Evaluation):  Nutrition Goals Re-Evaluation - 01/25/24 1041       Goals   Nutrition Goal Heart Healthy Diet    Comment [name] is doing well in rehab. He said his diet is better then it has been. He does like sweets and enjoys having it now and then. His wife and him are trying to choose heart healthy diet.    Expected Outcome Continue to work on diet and eating more fruits and  veggies and eating heart healthy             Psychosocial: Target Goals: Acknowledge presence or absence of significant depression and/or stress, maximize coping skills, provide positive support system. Participant is able to verbalize types and ability to use techniques and skills needed for reducing stress and depression.  Initial Review & Psychosocial Screening:  Initial Psych Review & Screening - 12/20/23 1143       Initial Review   Current issues with None Identified      Family Dynamics   Good Support System? Yes      Barriers   Psychosocial barriers to participate in program There are no identifiable barriers or psychosocial needs.;The patient should benefit from training in stress management and relaxation.      Screening Interventions   Interventions Encouraged to exercise;To provide support and resources with identified psychosocial needs;Provide feedback about the scores to participant    Expected Outcomes Short Term goal: Utilizing psychosocial counselor, staff and physician to assist with identification of specific Stressors or current issues interfering with healing process. Setting desired goal for each stressor or current issue identified.;Long Term Goal: Stressors or current issues are controlled or eliminated.;Short Term goal: Identification and review with participant of any Quality of Life or Depression concerns found by scoring the questionnaire.;Long Term goal: The participant improves quality of Life and PHQ9 Scores as seen by post scores and/or verbalization of changes             Quality of Life Scores:  Quality of Life - 03/21/24 1047       Quality of Life   Select Quality of Life      Quality of Life Scores   Health/Function Pre 22.9 %    Health/Function Post 22.5 %    Health/Function % Change -1.75 %    Socioeconomic  Pre 28.63 %    Socioeconomic Post 21.2 %    Socioeconomic % Change  -25.95 %    Psych/Spiritual Pre 26.63 %     Psych/Spiritual Post 22.86 %    Psych/Spiritual % Change -14.16 %    Family Pre 26.5 %    Family Post 22.83 %    Family % Change -13.85 %    GLOBAL Pre 24.74 %    GLOBAL Post 22.4 %    GLOBAL % Change -9.46 %            Scores of 19 and below usually indicate a poorer quality of life in these areas.  A difference of  2-3 points is a clinically meaningful difference.  A difference of 2-3 points in the total score of the Quality of Life Index has been associated with significant improvement in overall quality of life, self-image, physical symptoms, and general health in studies assessing change in quality of life.  PHQ-9: Review Flowsheet  More data may exist      03/21/2024 01/20/2024 12/21/2023 12/17/2023 05/25/2022  Depression screen PHQ 2/9  Decreased Interest 1 2 0 2 2 0 0  Down, Depressed, Hopeless 0 0 0 0 1 0 0  PHQ - 2 Score 1 2 0 2 3 0 0  Altered sleeping 2 0 0 1 0 -  Tired, decreased energy 1 3 1 2 2  -  Change in appetite 0 0 0 2 1 -  Feeling bad or failure about yourself  0 0 0 0 1 -  Trouble concentrating 1 3 1 1 1  -  Moving slowly or fidgety/restless 1 0 0 1 1 -  Suicidal thoughts 0 0 0 0 0 -  PHQ-9 Score 6 8 2 9 9  -  Difficult doing work/chores Not difficult at all Not difficult at all Not difficult at all Somewhat difficult Somewhat difficult -    Details       Multiple values from one day are sorted in reverse-chronological order        Interpretation of Total Score  Total Score Depression Severity:  1-4 = Minimal depression, 5-9 = Mild depression, 10-14 = Moderate depression, 15-19 = Moderately severe depression, 20-27 = Severe depression   Psychosocial Evaluation and Intervention:  Psychosocial Evaluation - 12/20/23 1143       Psychosocial Evaluation & Interventions   Interventions Stress management education;Relaxation education;Encouraged to exercise with the program and follow exercise prescription    Comments Patient was referred to CR with  NSTEMI/Stent placement. He denies any depression, anxiety or stressors in his life. He says he was a little anxious following his MI but this has resolved. He has OSA and has used a CPAP machine up until 1 year ago and had inspire implanted and this is working great for him. He sleeps well taking ambien . He has severe restless leg syndrome and takes cymbalta  and gabapentin  for this. He is getting over the flu and says he has some residual SOB and coughing. His wife is his main support person. He is retired as the [employer] for 23 years. He enjoys working with wood and makes furniture. He seems very motivated to participate in the program. His main goals are to improve his SOB and get his strength and stamina back and to get healthy overall. He has no barriers to complete the program identified.    Expected Outcomes Short Term: start the program and attend consistently. Long Term: complete the program meeting his personal  goals.    Continue Psychosocial Services  Follow up required by staff             Psychosocial Re-Evaluation:  Psychosocial Re-Evaluation     Row Name 01/11/24 1057 01/20/24 1110 01/25/24 1039         Psychosocial Re-Evaluation   Current issues with Current Sleep Concerns Current Sleep Concerns;Current Stress Concerns;History of Depression Current Sleep Concerns;Current Stress Concerns;History of Depression     Comments [name] is doing well in rehab.  He is generally a positive person and feeling good.  He feels like he is in a good place mentally.  He denies any major stressors other than his health, which he is working on.  He is sleeping decent.  He has good days and rough nights, but has been that way for a while.  He has tried a variety of techniques but has not had success.  He usually has an established bedtime routine and is regular.  They usually watch TV right up until bed, so we talked about trying to turn that off for a bit before bed.  I sent hm some  sleep information for him to review. Reviewed patient health questionnaire (PHQ-9) with patient for follow up. Previously, patients score indicated signs/symptoms of depression.  Reviewed to see if patient is improving symptom wise while in program.  Score improved and patient states that it is because they have been moving more and feeling better overall. He said that his wife feels like he is doing better as well. [name] continues do well in rehab. He still does not sleep well but says that it is his normal. He use to wear a CPAP but now has the implant so he does not have to worry about the CPAP. His health is his only stressor but he said his life has been good and he gets to do his wood working hobby.     Expected Outcomes Short: Review sleep information Long; Continue to exercise for mental boost Short: Continue to exercise for mental boost Long: Continued positivity Short: Continue to exercise for mental boost Long: Continued positivity     Interventions Encouraged to attend Cardiac Rehabilitation for the exercise Encouraged to attend Cardiac Rehabilitation for the exercise Encouraged to attend Cardiac Rehabilitation for the exercise     Continue Psychosocial Services  Follow up required by staff Follow up required by staff Follow up required by staff              Psychosocial Discharge (Final Psychosocial Re-Evaluation):  Psychosocial Re-Evaluation - 01/25/24 1039       Psychosocial Re-Evaluation   Current issues with Current Sleep Concerns;Current Stress Concerns;History of Depression    Comments [name] continues do well in rehab. He still does not sleep well but says that it is his normal. He use to wear a CPAP but now has the implant so he does not have to worry about the CPAP. His health is his only stressor but he said his life has been good and he gets to do his wood working hobby.    Expected Outcomes Short: Continue to exercise for mental boost Long: Continued positivity     Interventions Encouraged to attend Cardiac Rehabilitation for the exercise    Continue Psychosocial Services  Follow up required by staff             Vocational Rehabilitation: Provide vocational rehab assistance to qualifying candidates.   Vocational Rehab Evaluation & Intervention:  Vocational Rehab - 12/20/23 1143  Initial Vocational Rehab Evaluation & Intervention   Assessment shows need for Vocational Rehabilitation No      Vocational Rehab Re-Evaulation   Comments Patient is retired.             Education: Education Goals: Education classes will be provided on a weekly basis, covering required topics. Participant will state understanding/return demonstration of topics presented.  Learning Barriers/Preferences:  Learning Barriers/Preferences - 12/21/23 1610       Learning Barriers/Preferences   Learning Barriers Sight   glasses for reading   Learning Preferences Verbal Instruction;Skilled Demonstration             Education Topics: Hypertension, Hypertension Reduction -Define heart disease and high blood pressure. Discus how high blood pressure affects the body and ways to reduce high blood pressure. Flowsheet Row CARDIAC REHAB PHASE II EXERCISE from 03/23/2024 in [place] CARDIAC REHABILITATION  Date 03/16/24  Educator jh  Instruction Review Code 1- Verbalizes Understanding       Exercise and Your Heart -Discuss why it is important to exercise, the FITT principles of exercise, normal and abnormal responses to exercise, and how to exercise safely. Flowsheet Row CARDIAC REHAB PHASE II EXERCISE from 03/23/2024 in [place] CARDIAC REHABILITATION  Date 12/23/23  Educator HB  Instruction Review Code 1- Verbalizes Understanding       Angina -Discuss definition of angina, causes of angina, treatment of angina, and how to decrease risk of having angina. Flowsheet Row CARDIAC REHAB PHASE II EXERCISE from 03/23/2024 in [place] CARDIAC  REHABILITATION  Date 02/03/24  Anthony Bateman and procedures.]  Educator HB  Instruction Review Code 1- Verbalizes Understanding       Cardiac Medications -Review what the following cardiac medications are used for, how they affect the body, and side effects that may occur when taking the medications.  Medications include Aspirin , Beta blockers, calcium  channel blockers, ACE Inhibitors, angiotensin receptor blockers, diuretics, digoxin, and antihyperlipidemics. Flowsheet Row CARDIAC REHAB PHASE II EXERCISE from 03/23/2024 in [place] CARDIAC REHABILITATION  Date 01/19/24  Educator hb  Instruction Review Code 1- Verbalizes Understanding       Congestive Heart Failure -Discuss the definition of CHF, how to live with CHF, the signs and symptoms of CHF, and how keep track of weight and sodium intake. Flowsheet Row CARDIAC REHAB PHASE II EXERCISE from 03/23/2024 in [place] CARDIAC REHABILITATION  Date 01/13/24  Educator HB  Instruction Review Code 1- Verbalizes Understanding       Heart Disease and Intimacy -Discus the effect sexual activity has on the heart, how changes occur during intimacy as we age, and safety during sexual activity. Flowsheet Row CARDIAC REHAB PHASE II EXERCISE from 03/23/2024 in [place] CARDIAC REHABILITATION  Date 02/17/24  Educator HB  Instruction Review Code 1- Verbalizes Understanding       Smoking Cessation / COPD -Discuss different methods to quit smoking, the health benefits of quitting smoking, and the definition of COPD.   Nutrition I: Fats -Discuss the types of cholesterol, what cholesterol does to the heart, and how cholesterol levels can be controlled. Flowsheet Row CARDIAC REHAB PHASE II EXERCISE from 03/23/2024 in [place] CARDIAC REHABILITATION  Date 12/30/23  Educator HB  Instruction Review Code 1- Verbalizes Understanding       Nutrition II: Labels -Discuss the different components of food labels and how to read food  label Flowsheet Row CARDIAC REHAB PHASE II EXERCISE from 03/23/2024 in [place] CARDIAC REHABILITATION  Date 03/02/24  Educator HB  Instruction  Review Code 1- Verbalizes Understanding       Heart Parts/Heart Disease and PAD -Discuss the anatomy of the heart, the pathway of blood circulation through the heart, and these are affected by heart disease.   Stress I: Signs and Symptoms -Discuss the causes of stress, how stress may lead to anxiety and depression, and ways to limit stress. Flowsheet Row CARDIAC REHAB PHASE II EXERCISE from 03/23/2024 in [place] CARDIAC REHABILITATION  Date 01/06/24  Educator HB  Instruction Review Code 1- Verbalizes Understanding       Stress II: Relaxation -Discuss different types of relaxation techniques to limit stress. Flowsheet Row CARDIAC REHAB PHASE II EXERCISE from 03/23/2024 in [place] CARDIAC REHABILITATION  Date 02/24/24  Educator jh  Instruction Review Code 1- Verbalizes Understanding       Warning Signs of Stroke / TIA -Discuss definition of a stroke, what the signs and symptoms are of a stroke, and how to identify when someone is having stroke.   Knowledge Questionnaire Score:  Knowledge Questionnaire Score - 03/21/24 1047       Knowledge Questionnaire Score   Pre Score 18/26    Post Score 23/24             Core Components/Risk Factors/Patient Goals at Admission:  Personal Goals and Risk Factors at Admission - 12/21/23 0922       Core Components/Risk Factors/Patient Goals on Admission    Weight Management Weight Maintenance;Yes;Obesity;Weight Loss    Intervention Weight Management: Develop a combined nutrition and exercise program designed to reach desired caloric intake, while maintaining appropriate intake of nutrient and fiber, sodium and fats, and appropriate energy expenditure required for the weight goal.;Weight Management: Provide education and appropriate resources to help participant work on and attain  dietary goals.;Weight Management/Obesity: Establish reasonable short term and long term weight goals.;Obesity: Provide education and appropriate resources to help participant work on and attain dietary goals.    Admit Weight 203 lb 4.8 oz (92.2 kg)    Goal Weight: Short Term 198 lb (89.8 kg)    Goal Weight: Long Term 198 lb (89.8 kg)    Expected Outcomes Short Term: Continue to assess and modify interventions until short term weight is achieved;Long Term: Adherence to nutrition and physical activity/exercise program aimed toward attainment of established weight goal;Weight Maintenance: Understanding of the daily nutrition guidelines, which includes 25-35% calories from fat, 7% or less cal from saturated fats, less than 200mg  cholesterol, less than 1.5gm of sodium, & 5 or more servings of fruits and vegetables daily;Weight Loss: Understanding of general recommendations for a balanced deficit meal plan, which promotes 1-2 lb weight loss per week and includes a negative energy balance of [phone] kcal/d;Understanding recommendations for meals to include 15-35% energy as protein, 25-35% energy from fat, 35-60% energy from carbohydrates, less than 200mg  of dietary cholesterol, 20-35 gm of total fiber daily;Understanding of distribution of calorie intake throughout the day with the consumption of 4-5 meals/snacks    Improve shortness of breath with ADL's Yes    Intervention Provide education, individualized exercise plan and daily activity instruction to help decrease symptoms of SOB with activities of daily living.    Expected Outcomes Short Term: Improve cardiorespiratory fitness to achieve a reduction of symptoms when performing ADLs;Long Term: Be able to perform more ADLs without symptoms or delay the onset of symptoms    Hypertension Yes    Intervention Provide education on lifestyle modifcations including regular physical activity/exercise, weight management, moderate sodium restriction and increased  consumption of fresh fruit, vegetables, and low fat dairy, alcohol moderation, and smoking cessation.;Monitor prescription use compliance.    Expected Outcomes Short Term: Continued assessment and intervention until BP is < 140/34mm HG in hypertensive participants. < 130/56mm HG in hypertensive participants with diabetes, heart failure or chronic kidney disease.;Long Term: Maintenance of blood pressure at goal levels.    Lipids Yes    Intervention Provide education and support for participant on nutrition & aerobic/resistive exercise along with prescribed medications to achieve LDL 70mg , HDL >40mg .    Expected Outcomes Short Term: Participant states understanding of desired cholesterol values and is compliant with medications prescribed. Participant is following exercise prescription and nutrition guidelines.;Long Term: Cholesterol controlled with medications as prescribed, with individualized exercise RX and with personalized nutrition plan. Value goals: LDL < 70mg , HDL > 40 mg.             Core Components/Risk Factors/Patient Goals Review:   Goals and Risk Factor Review     Row Name 01/11/24 1103 01/25/24 1044           Core Components/Risk Factors/Patient Goals Review   Personal Goals Review Weight Management/Obesity;Hypertension;Improve shortness of breath with ADL's;Lipids Weight Management/Obesity;Hypertension;Improve shortness of breath with ADL's;Lipids      Review [name] is doing well in rehab.  His weight has been steady since being discharged.  He lost a lot of weight in hospital from flu but has been able to maintain it.  He would like to continue to lose more.  He has lost 35 lbs since he retired from being more active.  He has noticed that his breathing has gotten better as he continues to recover from the flu.  His pressures are doing well and he has been checking them at home. [name] continue to do well in rehab. His weight has been steady but he has noticed it hs gone up a lbs  ot 2 due to candy bowl. His blood pressure has been good at rehab, he does not check it at home.      Expected Outcomes Short: Conitnue to work on weight loss Long: Continue to monitor risk factors. Short: Conitnue to work on weight loss Long: Continue to monitor risk factors.               Core Components/Risk Factors/Patient Goals at Discharge (Final Review):   Goals and Risk Factor Review - 01/25/24 1044       Core Components/Risk Factors/Patient Goals Review   Personal Goals Review Weight Management/Obesity;Hypertension;Improve shortness of breath with ADL's;Lipids    Review [name] continue to do well in rehab. His weight has been steady but he has noticed it hs gone up a lbs ot 2 due to candy bowl. His blood pressure has been good at rehab, he does not check it at home.    Expected Outcomes Short: Conitnue to work on weight loss Long: Continue to monitor risk factors.             ITP Comments:  ITP Comments     Row Name 12/20/23 1153 12/21/23 0912 12/22/23 1003 01/19/24 1144 02/16/24 1432   ITP Comments Virtual orientation visit for cardiac rehab with NSTEMI/Stent placement completed. On-site orientation visit scheduled for 12/21/23 at 8:30. Paperwork sent through [employer]. Patient says he will completed prior to arrival. Patient attend orientation today.  Patient is attending Cardiac Rehabilitation Program.  Documentation for diagnosis [facility] be found in [facility] 12/03/23.  Reviewed medical chart, RPE/RPD, gym safety, and program guidelines.  Patient  was fitted to equipment they will be using during rehab.  Patient is scheduled to start exercise on Thursday 12/23/23 at 1030.   Initial ITP created and sent for review and signature by Dr. Armida Lander, Medical Director for Cardiac Rehabilitation Program. 30 day review completed. ITP sent to Dr. Armida Lander, Medical Director of Cardiac Rehab. Continue with ITP unless changes are made by physician.  Pt was oriented yesterday. 30 day review  completed. ITP sent to Dr. Armida Lander, Medical Director of Cardiac Rehab. Continue with ITP unless changes are made by physician. 30 day review completed. ITP sent to Dr. Armida Lander, Medical Director of Cardiac Rehab. Continue with ITP unless changes are made by physician.    Row Name 03/15/24 1029 03/23/24 1031         ITP Comments 30 day review completed. ITP sent to Dr. Armida Lander, Medical Director of Cardiac Rehab. Continue with ITP unless changes are made by physician. Deja graduated today from  rehab with 36 sessions completed.  Details of the patient's exercise prescription and what He needs to do in order to continue the prescription and progress were discussed with patient.  Patient was given a copy of prescription and goals.  Patient verbalized understanding. Nevaeh plans to continue to exercise by going to the gym .               Comments: Discharge ITP

## 2024-03-23 NOTE — Progress Notes (Signed)
 Discharge Progress Report  Patient Details  Name: [name] MRN: [id number] Date of Birth: Jun 13, 1951 Referring Provider:   Flowsheet Row CARDIAC REHAB PHASE II ORIENTATION from 12/21/2023 in [facility] CARDIAC REHABILITATION  Referring Provider Carry Clapper MD  [Primary Cardiologist: Dr. Myrtie Atkinson Harding]        Number of Visits: 36  Reason for Discharge:  Patient reached a stable level of exercise. Patient independent in their exercise. Patient has met program and personal goals.  Smoking History:  Social History   Tobacco Use  Smoking Status Former   Types: Cigars   Passive exposure: Never  Smokeless Tobacco Never    Diagnosis:  NSTEMI (non-ST elevated myocardial infarction) (HCC)  Status post coronary artery stent placement  ADL UCSD:   Initial Exercise Prescription:  Initial Exercise Prescription - 12/21/23 0900       Date of Initial Exercise RX and Referring Provider   Date 12/21/23    Referring Provider Carry Clapper MD   Primary Cardiologist: Dr. Randene Bustard     Oxygen   Maintain Oxygen Saturation 88% or higher      Treadmill   MPH 2.2    Grade 0.5    Minutes 15    METs 2.84      NuStep   Level 3    SPM 80    Minutes 15    METs 2.5      Prescription Details   Frequency (times per week) 2    Duration Progress to 30 minutes of continuous aerobic without signs/symptoms of physical distress      Intensity   THRR 40-80% of Max Heartrate 105-132    Ratings of Perceived Exertion 11-13    Perceived Dyspnea 0-4      Progression   Progression Continue to progress workloads to maintain intensity without signs/symptoms of physical distress.      Resistance Training   Training Prescription Yes    Weight 5 lb    Reps 10-15             Discharge Exercise Prescription (Final Exercise Prescription Changes):  Exercise Prescription Changes - 03/16/24 1500       Response to Exercise   Blood Pressure (Admit) 132/72    Blood Pressure (Exit)  130/80    Heart Rate (Admit) 69 bpm    Heart Rate (Exercise) 119 bpm    Heart Rate (Exit) 81 bpm    Rating of Perceived Exertion (Exercise) 12    Duration Continue with 30 min of aerobic exercise without signs/symptoms of physical distress.    Intensity THRR unchanged      Progression   Progression Continue to progress workloads to maintain intensity without signs/symptoms of physical distress.      Resistance Training   Training Prescription Yes    Weight 5    Reps 10-15      Treadmill   MPH 2.5    Grade 2    Minutes 15    METs 3.6      NuStep   Level 4    SPM 124    Minutes 15    METs 4.4      Home Exercise Plan   Plans to continue exercise at Home (comment)    Frequency Add 3 additional days to program exercise sessions.             Functional Capacity:  6 Minute Walk     Row Name 12/21/23 0913 03/13/24 1115  6 Minute Walk   Phase Initial Discharge    Distance 1325 feet 1570 feet    Distance Feet Change -- 242 ft    Walk Time 6 minutes 6 minutes    # of Rest Breaks 0 0    MPH 2.51 2.97    METS 2.76 3.24    RPE 7 11    Perceived Dyspnea  -- 0    VO2 Peak 9.67 11.33    Symptoms No No    Resting HR 78 bpm 87 bpm    Resting BP 104/62 120/60    Resting Oxygen Saturation  97 % 96 %    Exercise Oxygen Saturation  during 6 min walk 98 % 96 %    Max Ex. HR 103 bpm 113 bpm    Max Ex. BP 148/66 140/68    2 Minute Post BP 114/62 126/64             Psychological, QOL, Others - Outcomes: PHQ 2/9:    03/21/2024   10:49 AM 03/21/2024    8:40 AM 01/20/2024   11:09 AM 12/21/2023    8:22 AM 12/17/2023    1:45 PM  Depression screen PHQ 2/9  Decreased Interest 1 2 0 2 2  Down, Depressed, Hopeless 0 0 0 0 1  PHQ - 2 Score 1 2 0 2 3  Altered sleeping 2 0 0 1 0  Tired, decreased energy 1 3 1 2 2   Change in appetite 0 0 0 2 1  Feeling bad or failure about yourself  0 0 0 0 1  Trouble concentrating 1 3 1 1 1   Moving slowly or fidgety/restless 1 0 0 1 1   Suicidal thoughts 0 0 0 0 0  PHQ-9 Score 6 8 2 9 9   Difficult doing work/chores Not difficult at all Not difficult at all Not difficult at all Somewhat difficult Somewhat difficult    Quality of Life:  Quality of Life - 03/21/24 1047       Quality of Life   Select Quality of Life      Quality of Life Scores   Health/Function Pre 22.9 %    Health/Function Post 22.5 %    Health/Function % Change -1.75 %    Socioeconomic Pre 28.63 %    Socioeconomic Post 21.2 %    Socioeconomic % Change  -25.95 %    Psych/Spiritual Pre 26.63 %    Psych/Spiritual Post 22.86 %    Psych/Spiritual % Change -14.16 %    Family Pre 26.5 %    Family Post 22.83 %    Family % Change -13.85 %    GLOBAL Pre 24.74 %    GLOBAL Post 22.4 %    GLOBAL % Change -9.46 %             Personal Goals: Goals established at orientation with interventions provided to work toward goal.  Personal Goals and Risk Factors at Admission - 12/21/23 0922       Core Components/Risk Factors/Patient Goals on Admission    Weight Management Weight Maintenance;Yes;Obesity;Weight Loss    Intervention Weight Management: Develop a combined nutrition and exercise program designed to reach desired caloric intake, while maintaining appropriate intake of nutrient and fiber, sodium and fats, and appropriate energy expenditure required for the weight goal.;Weight Management: Provide education and appropriate resources to help participant work on and attain dietary goals.;Weight Management/Obesity: Establish reasonable short term and long term weight goals.;Obesity: Provide education and appropriate resources to  help participant work on and attain dietary goals.    Admit Weight 203 lb 4.8 oz (92.2 kg)    Goal Weight: Short Term 198 lb (89.8 kg)    Goal Weight: Long Term 198 lb (89.8 kg)    Expected Outcomes Short Term: Continue to assess and modify interventions until short term weight is achieved;Long Term: Adherence to nutrition and  physical activity/exercise program aimed toward attainment of established weight goal;Weight Maintenance: Understanding of the daily nutrition guidelines, which includes 25-35% calories from fat, 7% or less cal from saturated fats, less than 200mg  cholesterol, less than 1.5gm of sodium, & 5 or more servings of fruits and vegetables daily;Weight Loss: Understanding of general recommendations for a balanced deficit meal plan, which promotes 1-2 lb weight loss per week and includes a negative energy balance of [phone] kcal/d;Understanding recommendations for meals to include 15-35% energy as protein, 25-35% energy from fat, 35-60% energy from carbohydrates, less than 200mg  of dietary cholesterol, 20-35 gm of total fiber daily;Understanding of distribution of calorie intake throughout the day with the consumption of 4-5 meals/snacks    Improve shortness of breath with ADL's Yes    Intervention Provide education, individualized exercise plan and daily activity instruction to help decrease symptoms of SOB with activities of daily living.    Expected Outcomes Short Term: Improve cardiorespiratory fitness to achieve a reduction of symptoms when performing ADLs;Long Term: Be able to perform more ADLs without symptoms or delay the onset of symptoms    Hypertension Yes    Intervention Provide education on lifestyle modifcations including regular physical activity/exercise, weight management, moderate sodium restriction and increased consumption of fresh fruit, vegetables, and low fat dairy, alcohol moderation, and smoking cessation.;Monitor prescription use compliance.    Expected Outcomes Short Term: Continued assessment and intervention until BP is < 140/3mm HG in hypertensive participants. < 130/54mm HG in hypertensive participants with diabetes, heart failure or chronic kidney disease.;Long Term: Maintenance of blood pressure at goal levels.    Lipids Yes    Intervention Provide education and support for  participant on nutrition & aerobic/resistive exercise along with prescribed medications to achieve LDL 70mg , HDL >40mg .    Expected Outcomes Short Term: Participant states understanding of desired cholesterol values and is compliant with medications prescribed. Participant is following exercise prescription and nutrition guidelines.;Long Term: Cholesterol controlled with medications as prescribed, with individualized exercise RX and with personalized nutrition plan. Value goals: LDL < 70mg , HDL > 40 mg.              Personal Goals Discharge:  Goals and Risk Factor Review     Row Name 01/11/24 1103 01/25/24 1044           Core Components/Risk Factors/Patient Goals Review   Personal Goals Review Weight Management/Obesity;Hypertension;Improve shortness of breath with ADL's;Lipids Weight Management/Obesity;Hypertension;Improve shortness of breath with ADL's;Lipids      Review [name] is doing well in rehab.  His weight has been steady since being discharged.  He lost a lot of weight in hospital from flu but has been able to maintain it.  He would like to continue to lose more.  He has lost 35 lbs since he retired from being more active.  He has noticed that his breathing has gotten better as he continues to recover from the flu.  His pressures are doing well and he has been checking them at home. [name] continue to do well in rehab. His weight has been steady but he has noticed it hs gone  up a lbs ot 2 due to candy bowl. His blood pressure has been good at rehab, he does not check it at home.      Expected Outcomes Short: Conitnue to work on weight loss Long: Continue to monitor risk factors. Short: Conitnue to work on weight loss Long: Continue to monitor risk factors.               Exercise Goals and Review:  Exercise Goals     Row Name 12/21/23 [phone]             Exercise Goals   Increase Physical Activity Yes       Intervention Provide advice, education, support and counseling  about physical activity/exercise needs.;Develop an individualized exercise prescription for aerobic and resistive training based on initial evaluation findings, risk stratification, comorbidities and participant's personal goals.       Expected Outcomes Long Term: Add in home exercise to make exercise part of routine and to increase amount of physical activity.;Long Term: Exercising regularly at least 3-5 days a week.;Short Term: Attend rehab on a regular basis to increase amount of physical activity.       Increase Strength and Stamina Yes       Intervention Provide advice, education, support and counseling about physical activity/exercise needs.;Develop an individualized exercise prescription for aerobic and resistive training based on initial evaluation findings, risk stratification, comorbidities and participant's personal goals.       Expected Outcomes Short Term: Increase workloads from initial exercise prescription for resistance, speed, and METs.;Short Term: Perform resistance training exercises routinely during rehab and add in resistance training at home;Long Term: Improve cardiorespiratory fitness, muscular endurance and strength as measured by increased METs and functional capacity ( )       Able to understand and use rate of perceived exertion (RPE) scale Yes       Intervention Provide education and explanation on how to use RPE scale       Expected Outcomes Short Term: Able to use RPE daily in rehab to express subjective intensity level;Long Term:  Able to use RPE to guide intensity level when exercising independently       Able to understand and use Dyspnea scale Yes       Intervention Provide education and explanation on how to use Dyspnea scale       Expected Outcomes Short Term: Able to use Dyspnea scale daily in rehab to express subjective sense of shortness of breath during exertion;Long Term: Able to use Dyspnea scale to guide intensity level when exercising independently        Knowledge and understanding of Target Heart Rate Range (THRR) Yes       Intervention Provide education and explanation of THRR including how the numbers were predicted and where they are located for reference       Expected Outcomes Short Term: Able to state/look up THRR;Long Term: Able to use THRR to govern intensity when exercising independently;Short Term: Able to use daily as guideline for intensity in rehab       Able to check pulse independently Yes       Intervention Provide education and demonstration on how to check pulse in carotid and radial arteries.;Review the importance of being able to check your own pulse for safety during independent exercise       Expected Outcomes Short Term: Able to explain why pulse checking is important during independent exercise;Long Term: Able to check pulse independently and accurately  Understanding of Exercise Prescription Yes       Intervention Provide education, explanation, and written materials on patient's individual exercise prescription       Expected Outcomes Short Term: Able to explain program exercise prescription;Long Term: Able to explain home exercise prescription to exercise independently                Exercise Goals Re-Evaluation:  Exercise Goals Re-Evaluation     Row Name 01/03/24 1219 01/11/24 1055 01/25/24 1036 02/02/24 1318 03/13/24 1347     Exercise Goal Re-Evaluation   Exercise Goals Review Increase Physical Activity;Increase Strength and Stamina;Understanding of Exercise Prescription Increase Physical Activity;Increase Strength and Stamina;Able to understand and use rate of perceived exertion (RPE) scale;Knowledge and understanding of Target Heart Rate Range (THRR);Able to understand and use Dyspnea scale;Able to check pulse independently;Understanding of Exercise Prescription Increase Physical Activity;Increase Strength and Stamina;Understanding of Exercise Prescription -- Increase Physical Activity;Increase Strength and  Stamina;Understanding of Exercise Prescription   Comments [name] is doing well in rehab and is on his 6th visit. He has increased his speed on the treadmill to 2.5. He is execising at an RPE of 11 on both sets of equipment and is wanting to increase to push himsl;ef. Will continue to monitor and progress as able. [name] is doing well in rehab. He is feeling good and walking some around house and at store.  Reviewed home exercise with pt today.  Pt plans to walk at home for exercise.  We also talked about using staff videos on bad weather days. Reviewed THR, pulse, RPE, sign and symptoms, pulse oximetery and when to call 911 or MD.  Also discussed weather considerations and indoor options.  Pt voiced understanding. [name] is doing great with exercise. He continues to feel good and is doing yard work around [employer]. He is doing his wood working as well. He doesnt get as wrn out as he use to. [name] is doing well in rehab and is on his 19th visit. He is increasing his speed on the treadmill and level on the nustep. He is at a walking speed of 3 mph with a grade of 2 and level 3 on the nustoe Will continue to monitor and progress as able [name] is doing well in rehab. He is almost done with the program and has been increasing his walking levels on the treadmill. He is walking at home and plans to continue to walk when he is done with the program. He stated that he has more energy since starting the program. He did his post walk test today and went 271ft longer then the first visit.   Expected Outcomes Continue to come to reah Short: Start to add in more exercise at home on off days Long: Continue to improve stamina Short: Start to add in more exercise at home on off days Long: Continue to improve stamina continue to attend rehab Short: continue to attend reha to the end  long : continue to exercise at homw            Nutrition & Weight - Outcomes:  Pre Biometrics - 12/21/23 0917       Pre Biometrics   Height  5\' 9"  (1.753 m)    Weight 92.2 kg    Waist Circumference 39 inches    Hip Circumference 40 inches    Waist to Hip Ratio 0.98 %    BMI (Calculated) 30.01    Grip Strength 43.2 kg    Single Leg Stand 18.4  seconds             Post Biometrics - 03/13/24 1115        Post  Biometrics   Height 5\' 9"  (1.753 m)    Weight 92 kg    Waist Circumference 40 inches    Hip Circumference 40 inches    Waist to Hip Ratio 1 %    BMI (Calculated) 29.94    Grip Strength 44.9 kg    Single Leg Stand 24.6 seconds             Nutrition:  Nutrition Therapy & Goals - 12/21/23 1526       Intervention Plan   Intervention Prescribe, educate and counsel regarding individualized specific dietary modifications aiming towards targeted core components such as weight, hypertension, lipid management, diabetes, heart failure and other comorbidities.;Nutrition handout(s) given to patient.    Expected Outcomes Short Term Goal: Understand basic principles of dietary content, such as calories, fat, sodium, cholesterol and nutrients.;Long Term Goal: Adherence to prescribed nutrition plan.             Nutrition Discharge:   Education Questionnaire Score:  Knowledge Questionnaire Score - 03/21/24 1047       Knowledge Questionnaire Score   Pre Score 18/26    Post Score 23/24             Goals reviewed with patient; copy given to patient.

## 2024-03-23 NOTE — Progress Notes (Signed)
 Daily Session Note  Patient Details  Name: [name] MRN: [id number] Date of Birth: 1951/07/16 Referring Provider:   Flowsheet Cuevas CARDIAC REHAB PHASE II ORIENTATION from 12/21/2023 in [facility] CARDIAC REHABILITATION  Referring Provider Johnny Clapper MD  [facility] Cardiologist: Dr. Myrtie Atkinson Cuevas]       Encounter Date: 03/23/2024  Check In:  Session Check In - 03/23/24 1029       Check-In   Supervising physician immediately available to respond to emergencies See telemetry face sheet for immediately available MD    Location [facility]    Staff Present Johnny Cherry, MA, RCEP, CCRP, CCET;[name], [place], Exercise Physiologist    Virtual Visit No    Medication changes reported     No    Fall or balance concerns reported    No    Tobacco Cessation No Change    Warm-up and Cool-down Performed on first and last piece of equipment    Resistance Training Performed Yes    VAD Patient? No    PAD/SET Patient? No      Pain Assessment   Currently in Pain? No/denies    Pain Score 0-No pain    Multiple Pain Sites No             Capillary Blood Glucose: No results found for this or any previous visit (from the past 24 hours).    Social History   Tobacco Use  Smoking Status Former   Types: Cigars   Passive exposure: Never  Smokeless Tobacco Never    Goals Met:  Independence with exercise equipment Exercise tolerated well No report of concerns or symptoms today Strength training completed today  Goals Unmet:  Not Applicable  Comments:  [name] graduated today from  rehab with 36 sessions completed.  Details of the patient's exercise prescription and what He needs to do in order to continue the prescription and progress were discussed with patient.  Patient was given a copy of prescription and goals.  Patient verbalized understanding. [name] plans to continue to exercise by going to the gym .

## 2024-03-28 ENCOUNTER — Encounter (HOSPITAL_COMMUNITY): Payer: Medicare Other

## 2024-03-30 ENCOUNTER — Encounter (HOSPITAL_COMMUNITY): Payer: Medicare Other

## 2024-04-04 ENCOUNTER — Encounter (HOSPITAL_COMMUNITY): Payer: Medicare Other

## 2024-04-06 ENCOUNTER — Encounter (HOSPITAL_COMMUNITY): Payer: Medicare Other

## 2024-04-11 ENCOUNTER — Encounter (HOSPITAL_COMMUNITY): Payer: Medicare Other

## 2024-04-13 ENCOUNTER — Other Ambulatory Visit: Payer: Self-pay | Admitting: Internal Medicine

## 2024-04-13 ENCOUNTER — Encounter (HOSPITAL_COMMUNITY): Payer: Medicare Other

## 2024-04-13 DIAGNOSIS — G4700 Insomnia, unspecified: Secondary | ICD-10-CM

## 2024-04-18 ENCOUNTER — Encounter: Payer: Self-pay | Admitting: Cardiology

## 2024-04-18 ENCOUNTER — Ambulatory Visit: Payer: Medicare Other | Attending: Cardiology | Admitting: Cardiology

## 2024-04-18 ENCOUNTER — Encounter (HOSPITAL_COMMUNITY): Payer: Medicare Other

## 2024-04-18 VITALS — BP 118/80 | HR 65 | Ht 68.0 in | Wt 202.2 lb

## 2024-04-18 DIAGNOSIS — G4733 Obstructive sleep apnea (adult) (pediatric): Secondary | ICD-10-CM | POA: Diagnosis not present

## 2024-04-18 DIAGNOSIS — T466X5D Adverse effect of antihyperlipidemic and antiarteriosclerotic drugs, subsequent encounter: Secondary | ICD-10-CM

## 2024-04-18 DIAGNOSIS — I77810 Thoracic aortic ectasia: Secondary | ICD-10-CM | POA: Insufficient documentation

## 2024-04-18 DIAGNOSIS — T466X5A Adverse effect of antihyperlipidemic and antiarteriosclerotic drugs, initial encounter: Secondary | ICD-10-CM | POA: Diagnosis not present

## 2024-04-18 DIAGNOSIS — I2510 Atherosclerotic heart disease of native coronary artery without angina pectoris: Secondary | ICD-10-CM | POA: Insufficient documentation

## 2024-04-18 DIAGNOSIS — N1831 Chronic kidney disease, stage 3a: Secondary | ICD-10-CM | POA: Diagnosis not present

## 2024-04-18 DIAGNOSIS — I10 Essential (primary) hypertension: Secondary | ICD-10-CM | POA: Diagnosis not present

## 2024-04-18 DIAGNOSIS — E785 Hyperlipidemia, unspecified: Secondary | ICD-10-CM | POA: Diagnosis not present

## 2024-04-18 DIAGNOSIS — Z955 Presence of coronary angioplasty implant and graft: Secondary | ICD-10-CM | POA: Insufficient documentation

## 2024-04-18 DIAGNOSIS — G720 Drug-induced myopathy: Secondary | ICD-10-CM | POA: Diagnosis not present

## 2024-04-18 NOTE — Progress Notes (Signed)
 Cardiology Office Note:  .   Date:  04/26/2024  ID:  [name], DOB [birth year], MRN [id number] PCP: No primary care provider on file.  [facility] Providers Cardiologist:  Alm Clay, MD     Chief Complaint  Patient presents with   Follow-up   Coronary Artery Disease    35-month follow-up.  Doing relatively well.    Patient Profile: .     [name] is a mildly obese 73 y.o. male with a PMH notable for  NSTEMI 12/03/2023 (status post DES to D1, and proximal-mid LAD), hypertension, hyperlipidemia, CKD, OSA(Inspire) who presents here for 108-month follow-up at the request of Shona Norleen PEDLAR, MD.  PMH: CAD-non-STEMI 12/03/2023 (PCI of D1, and proximal of the mid LAD jailing D1) Preserved EF on echo with mild ascending artery dilation. HTN HLD-statin intolerant CKD 3     SHI BLANKENSHIP was seen for hospital follow-up 10/28/1823 by Thom Sluder, PA.  He suffered a non-STEMI earlier that month and had LAD and D1 PCI.  Discharged on aspirin  and Effient .  He also was treated for the flu.  His hospitalization.  Statin myalgias in the past and was referred to the lipid clinic to establish PCSK9 inhibitor therapy.  Was doing well.  Symptoms were improving since recovering the flu.  During that illness he had lost 15 pounds due to lack of appetite.  He was open to keep it off.  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  No chest pain or pressure. => No medication changes made.  Lipid clinic referral made.  Subjective  Discussed the use of AI scribe software for clinical note transcription with the patient, who gave verbal consent to proceed.  History of Present Illness History of Present Illness [name] is a 73 year old male with a history of myocardial infarction who presents for follow-up after stent placement. He is accompanied by his wife, [name].  Four months ago, he experienced a myocardial infarction and underwent cardiac  catheterization with the placement of two stents due to coronary artery blockages. He did not have typical symptoms such as arm pain but experienced chest pain, initially attributing it to bronchitis or the flu, which he had concurrently. He is currently on aspirin  and prasugrel  for antiplatelet therapy.  Since starting prasugrel , he has noticed significant bruising but no bleeding, blood in stools, or melena. He is also on metoprolol  for heart protection post-MI. He previously took hydrochlorothiazide for blood pressure management due to having one kidney, but this was changed to metoprolol . He experiences dizziness when standing up quickly but no palpitations or dyspnea.  He has a history of sleep apnea and has an Inspire device implanted, which he finds beneficial after initial adjustment difficulties. He no longer needs to use a CPAP machine, which he appreciates, especially when traveling.  He suffers from restless leg syndrome, which he describes as worsening and possibly developing into neuropathy. He is currently taking pramipexole , gabapentin , and duloxetine  for this condition. No leg swelling is noted.  He completed a cardiac rehabilitation program and is actively managing his diet by avoiding red meat and focusing on chicken and [place]. He has a history of high cholesterol, which is now well-controlled with Repatha , as he could not tolerate statins due to leg issues. He is physically active, engaging in woodworking, and does not smoke.  No symptoms of heart failure such as orthopnea, paroxysmal nocturnal dyspnea, or  peripheral edema. No recent chest pain or pressure.    Objective   Medications - Aspirin  81 mg daily;- Prasugrel  10 mg daily - Metoprolol  succinate 12.5 mg daily (1/2 of 25 mg tablet) - Repatha  140 mg every 2 weeks  - Mirapex  0.5 mg nightly - Gabapentin  300 mg twice daily and 600 mg nightly; - Cymbalta  30 mg-2 tabs daily - As needed Ultram  and NTG.  Studies Reviewed: SABRA         Lab Results  Component Value Date   CHOL 112 02/10/2024   HDL 51 02/10/2024   LDLCALC 45 02/10/2024   TRIG 77 02/10/2024   CHOLHDL 2.2 02/10/2024   Lab Results  Component Value Date   NA 138 12/07/2023   K 4.0 12/07/2023   CREATININE 1.45 (H) 12/07/2023   GFRNONAA 51 (L) 12/07/2023   GLUCOSE 103 (H) 12/07/2023    Lab Results  Component Value Date   HGBA1C 5.4 12/05/2023    ECHO: EF 50-55% anterolateral wall is hypokinetic.  GR 1 DD.  Moderate RA dilation with normal PAP.  Moderate TR.  No left ventricular mural or apical thrombi (12/04/2023) CATH: Culprit lesion 90% D1 (DES PCI 2.5 x 12 mm Synergy XD-2.6 mm); proximal-mid LAD 70 Percent Crossing D1 (Synergy XD 3.0 x 28 to 43.2 mm) distal LAD 30% and 60%.  (12/06/2023) Dominance: Left           Intervention    Risk Assessment/Calculations:         Physical Exam:   VS:  BP 118/80   Pulse 65   Ht 5' 8 (1.727 m)   Wt 202 lb 3.2 oz (91.7 kg)   SpO2 95%   BMI 30.74 kg/m    Wt Readings from Last 3 Encounters:  04/18/24 202 lb 3.2 oz (91.7 kg)  03/21/24 201 lb 6.4 oz (91.4 kg)  03/14/24 202 lb (91.6 kg)    GEN: Well nourished, well groomed in no acute distress; healthy-appearing.  Mild obesity. NECK: No JVD; No carotid bruits CARDIAC: Normal S1, S2; RRR, no murmurs, rubs, gallops RESPIRATORY:  Clear to auscultation without rales, wheezing or rhonchi ; nonlabored, good air movement. ABDOMEN: Soft, non-tender, non-distended EXTREMITIES:  No edema; No deformity      ASSESSMENT AND PLAN: .    Problem List Items Addressed This Visit       Cardiology Problems   Aortic root dilation (HCC) (Chronic)   Mild aortic root dilation.  Can consider reassessment with either echo or CT scan after 1 year.  Likely normal for age and body habitus.      Coronary artery disease involving native coronary artery without angina pectoris - Primary (Chronic)   Four months post-MI with stent placement. On aspirin  and prasugrel  for  stent patency. No recurrent chest pain or heart failure symptoms. Bruising likely due to antiplatelet therapy. Blood pressure controlled with metoprolol , providing cardiac protection and arrhythmia prevention. Heart muscle damage noted but expected to recover significantly with treatment. - Continue aspirin  and prasugrel  for one year post-stent placement. - Transition to Plavix monotherapy after one year for a second year. - Monitor for signs of bleeding, including blood in stool, urine, or significant bruising. - Hold aspirin  for a week if significant bruising occurs. - Continue low-dose Toprol  12.5 mg daily - Continue Repatha  140 mg injections every 2 weeks lipids. - Encourage regular physical activity to monitor cardiac status. - Schedule follow-up in four months with PA Thom, then eight months with the cardiologist.  Essential (primary) hypertension (Chronic)   BP well-controlled on low-dose Toprol . -Continue Toprol  12.5 mg daily.       Hyperlipidemia with target low density lipoprotein (LDL) cholesterol less than 55 mg/dL (Chronic)   Hyperlipidemia improved with Repatha , achieving excellent cholesterol levels. Previous statin intolerance due to muscle pain. Repatha  effective in reducing cardiovascular risk. - Continue Repatha  injections every 2  weeks.        Other   OSA (obstructive sleep apnea) (Chronic)   Sleep apnea managed with Inspire device. Initial adjustment difficulties resolved, satisfied with performance. No longer requires CPAP, improving quality of life. . Chronic restless legs syndrome managed with pramipexole , gabapentin , and duloxetine . Symptoms persistent, possibly worsening, with neuropathy concerns. No venous stasis or swelling.      Presence of drug coated stent in LAD coronary artery (Chronic)   Status post PCI to the LAD and D1 branch in setting of non-STEMI on December 06, 2023:-Plan is 1 year uninterrupted DAPT (aspirin  81 mg and Effient  10 mg  daily). - Continue aspirin  and prasugrel  for one year post-stent placement. - For urgent surgery or procedures, could consider temporarily holding antiplatelet agents after 6 months - Transition to Plavix after one year for a second year.      Stage 3 chronic kidney disease (HCC) (Chronic)   Creatinine 1.5.  Follow-up with nephrology.      Statin myopathy (Chronic)   Statin myopathy/myalgias in response to atorvastatin  and rosuvastatin  as well as simvastatin in the past. Statin intolerance due to muscle pain, managed with Repatha  effectively without adverse effects.              Follow-Up: Return in about 4 months (around 08/18/2024) for Alternate 4 months with APP, 8 month with MD.  Total time spent: 26 min spent with patient + 21 min spent charting = 47 min I spent 47 minutes in the care of [name] today including reviewing labs (2 minutes reviewing pre and post path at labs), reviewing studies (cath films reviewed and echo were images reviewed-6 minutes), face to face time discussing treatment options (26 minutes), reviewing records from APP note and hospitalization (4 minutes), 9 minutes, and documenting in the encounter.     Signed, Alm MICAEL Clay, MD, MS Alm Clay, M.D., M.S. Interventional Chartered certified accountant  Pager # [phone]

## 2024-04-18 NOTE — Patient Instructions (Addendum)
 Medication Instructions:  No changes    From a Cardiac standpoint if your Nephrologist would like to medicatio n( hydrochlorothiazide) hydrochlorothiazide it would be okay    *If you need a refill on your cardiac medications before your next appointment, please call your pharmacy*   Lab Work: Not needed    Testing/Procedures:  Not needed  Follow-Up: At [facility], you and your health needs are our priority.  As part of our continuing mission to provide you with exceptional heart care, we have created designated Provider Care Teams.  These Care Teams include your primary Cardiologist (physician) and Advanced Practice Providers (APPs -  Physician Assistants and Nurse Practitioners) who all work together to provide you with the care you need, when you need it.     Your next appointment:   4 month(s)  The format for your next appointment:   In Person  Provider:   Thom Sluder, PA-C   Then, Alm Clay, MD will plan to see you again in 8 month(s).

## 2024-04-26 ENCOUNTER — Encounter: Payer: Self-pay | Admitting: Cardiology

## 2024-04-26 DIAGNOSIS — G720 Drug-induced myopathy: Secondary | ICD-10-CM | POA: Insufficient documentation

## 2024-04-26 DIAGNOSIS — I2510 Atherosclerotic heart disease of native coronary artery without angina pectoris: Secondary | ICD-10-CM | POA: Insufficient documentation

## 2024-04-26 NOTE — Assessment & Plan Note (Signed)
 Statin myopathy/myalgias in response to atorvastatin  and rosuvastatin  as well as simvastatin in the past. Statin intolerance due to muscle pain, managed with Repatha  effectively without adverse effects.

## 2024-04-26 NOTE — Assessment & Plan Note (Signed)
 Creatinine 1.5.  Follow-up with nephrology.

## 2024-04-26 NOTE — Assessment & Plan Note (Signed)
 Four months post-MI with stent placement. On aspirin  and prasugrel  for stent patency. No recurrent chest pain or heart failure symptoms. Bruising likely due to antiplatelet therapy. Blood pressure controlled with metoprolol , providing cardiac protection and arrhythmia prevention. Heart muscle damage noted but expected to recover significantly with treatment. - Continue aspirin  and prasugrel  for one year post-stent placement. - Transition to Plavix monotherapy after one year for a second year. - Monitor for signs of bleeding, including blood in stool, urine, or significant bruising. - Hold aspirin  for a week if significant bruising occurs. - Continue low-dose Toprol  12.5 mg daily - Continue Repatha  140 mg injections every 2 weeks lipids. - Encourage regular physical activity to monitor cardiac status. - Schedule follow-up in four months with PA Thom, then eight months with the cardiologist.

## 2024-04-26 NOTE — Assessment & Plan Note (Signed)
 Mild aortic root dilation.  Can consider reassessment with either echo or CT scan after 1 year.  Likely normal for age and body habitus.

## 2024-04-26 NOTE — Assessment & Plan Note (Signed)
 Hyperlipidemia improved with Repatha , achieving excellent cholesterol levels. Previous statin intolerance due to muscle pain. Repatha  effective in reducing cardiovascular risk. - Continue Repatha  injections every 2  weeks.

## 2024-04-26 NOTE — Assessment & Plan Note (Signed)
 Status post PCI to the LAD and D1 branch in setting of non-STEMI on December 06, 2023:-Plan is 1 year uninterrupted DAPT (aspirin  81 mg and Effient  10 mg daily). - Continue aspirin  and prasugrel  for one year post-stent placement. - For urgent surgery or procedures, could consider temporarily holding antiplatelet agents after 6 months - Transition to Plavix after one year for a second year.

## 2024-04-26 NOTE — Assessment & Plan Note (Addendum)
 Sleep apnea managed with Inspire device. Initial adjustment difficulties resolved, satisfied with performance. No longer requires CPAP, improving quality of life. . Chronic restless legs syndrome managed with pramipexole , gabapentin , and duloxetine . Symptoms persistent, possibly worsening, with neuropathy concerns. No venous stasis or swelling.

## 2024-04-26 NOTE — Assessment & Plan Note (Signed)
 BP well-controlled on low-dose Toprol . -Continue Toprol  12.5 mg daily.

## 2024-05-01 DIAGNOSIS — G4733 Obstructive sleep apnea (adult) (pediatric): Secondary | ICD-10-CM | POA: Diagnosis not present

## 2024-05-01 DIAGNOSIS — N200 Calculus of kidney: Secondary | ICD-10-CM | POA: Diagnosis not present

## 2024-05-01 DIAGNOSIS — N1831 Chronic kidney disease, stage 3a: Secondary | ICD-10-CM | POA: Diagnosis not present

## 2024-05-01 DIAGNOSIS — I129 Hypertensive chronic kidney disease with stage 1 through stage 4 chronic kidney disease, or unspecified chronic kidney disease: Secondary | ICD-10-CM | POA: Diagnosis not present

## 2024-05-09 ENCOUNTER — Telehealth: Payer: Self-pay | Admitting: Cardiology

## 2024-05-09 NOTE — Telephone Encounter (Signed)
 Called and left VM for patient to call back to discuss experience at last OV in our office due to survey he filled out for us  in June 2025.

## 2024-05-10 ENCOUNTER — Ambulatory Visit (INDEPENDENT_AMBULATORY_CARE_PROVIDER_SITE_OTHER): Admitting: Nurse Practitioner

## 2024-05-10 ENCOUNTER — Encounter: Payer: Self-pay | Admitting: Nurse Practitioner

## 2024-05-10 VITALS — BP 132/64 | HR 68 | Ht 68.0 in | Wt 203.0 lb

## 2024-05-10 DIAGNOSIS — G2581 Restless legs syndrome: Secondary | ICD-10-CM

## 2024-05-10 DIAGNOSIS — G4733 Obstructive sleep apnea (adult) (pediatric): Secondary | ICD-10-CM

## 2024-05-10 NOTE — Patient Instructions (Addendum)
 Continue gabapentin  300 mg in afternoon then 900 mg at bedtime. If this doesn't help more than the 600 mg, go back to the 600 mg at bedtime  Continue pramipexole  1 tablet at dinner and 1 tablet at dinner  Continue duloxetine  two capsules daily   Continue Inspire nightly. Step up by 1 level every week until you are at a level 4, if tolerating. If uncomfortable, step back down and stay at that level. Let me know once you are on a comfortable setting and we will repeat your home sleep study   Check with this website for local specialist on the Nidra device  [email or web address].[email or web address]/[email or web address]  Keep appt with RLS specialist   Can look at dry needling also   Labs today   Follow up in 8 weeks with Dr. Neda or Katie Marcha Licklider,NP. If symptoms do not improve or worsen, please contact office for sooner follow up or seek emergency care.

## 2024-05-10 NOTE — Progress Notes (Signed)
 "  @Patient  ID: [name], male    DOB: Jan 02, 1951, 73 y.o.   MRN: [id number]  Chief Complaint  Patient presents with   Follow-up    Pt states no concerns     Referring provider: Melvenia Manus BRAVO, MD  HPI: 73 year old male, former smoker followed for severe OSA treated with Inspire and RLS. He is a patient of Dr. Cathye and last seen in office 03/14/2024. Past medical history significant for NSTEMI, CAD, HTN, CKD, IDA.  TEST/EVENTS:  04/06/2022 split night study: AHI 39.5/h, SpO2 low 85% 07/21/2022: DISE with Dr. Carlie; anterior posterior collapse  09/09/2022: Inspire implantation with Dr. Carlie  11/26/2022: Inspire activation  03/10/2023 inspire titration: persistent severe OSA despite Inspire use; increased in supine >> changed to electrode B setting 05/24/2023 HST: AHI 53/h on Inspire electrode B >> changed to electrode C setting 07/28/2023 HST: AHI 15.2/h on Inspire electrode C, 0.8 V  03/14/2024: Ov with Dr. Neda. Currently on voltage 0.8; adjusted to 0.9 V. wakes up in the middle of the night and feels the machine is not working well. More sleepiness. Struggles with RLS despite neurontin , mirapex  and cymbalta . Tried requip in past. On C mode, pause 20 min, delay 30 min, 8 hr duration. Still snoring when he's on his back. Encouraged to increase voltage as tolerated. Optimize treatment for RLS. Increase neurontin  to 600 mg at bedtime and 300 in afternoon. Can increase to 900 at night if needed. Will schedule WatchPAT once he is more stable.   05/10/2024: Today - follow up Discussed the use of AI scribe software for clinical note transcription with the patient, who gave verbal consent to proceed.  History of Present Illness [name] is a 73 year old male with sleep apnea treated with Inspire and restless leg syndrome who presents for follow-up on his sleep apnea management and restless leg symptoms.  Since his last visit, he has increased the voltage on his Inspire  device to level 2, which has improved his sleep quality and energy levels during the day. He reports that he is comfortable with the current settings and has not tried higher levels yet. Snoring has decreased. No drowsy driving.   He has a history of restless leg syndrome, which he describes as 'going atomic' and significantly impacting his life, preventing him from planning overnight trips or visiting others. He refers to episodes as 'the storm' and reports that they have been occurring frequently over the past several months. He has tried various treatments in the past, including Requip and another unspecified medication, without success. He currently takes gabapentin  and pramipexole , which initially provided relief but have become less effective over time. He has increased his gabapentin  dose to 600 mg at bedtime and takes 300 mg in the afternoon. He also uses  Cymbalta . Waiting on a specialist appt at [facility] but didn't have openings until February. No issues with drowsiness related to medication use or aberrant behavior.   He has also tried smoking marijuana, which he finds provides some relief during episodes, although he is hesitant about its use.     04/10/2024-05/09/2024 Inspire Download 0.9-1.2 V, Electrode C, 90/33  Delay 30 min, pause 20 min, duration 8 hr 29/30 days; 73% >4 hr Avg use 5 hr 15 min Avg pause 0.2 Current setting: level 2 (1 V)  Allergies  Allergen Reactions   Statins Other (See Comments)    Muscle pain   Nsaids Other (See Comments)    Cannot take  because of my kidney disease.    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   Sleep apnea    uses Cpap nightly    Tobacco History: Social History   Tobacco Use  Smoking Status Former   Types: Cigars   Passive exposure: Never  Smokeless Tobacco Never   Counseling given: Not Answered   Outpatient Medications Prior to Visit  Medication Sig Dispense Refill   aspirin  EC 81 MG tablet Take 1 tablet (81 mg total) by mouth daily. Swallow whole.     DULoxetine  (CYMBALTA ) 30 MG capsule TAKE TWO CAPSULES BY MOUTH EVERY DAY 60 capsule 2   Evolocumab  (REPATHA  SURECLICK) 140 MG/ML SOAJ Inject 140 mg into the skin every 14 (fourteen) days. 2 mL 2   gabapentin  (NEURONTIN ) 300 MG capsule Take 300 mg by mouth See admin instructions. One tablet in the morning and afternoon and two tablets at bedtime (Patient taking differently: Take 300 mg by mouth 3 (three) times daily. One tablet in the morning and afternoon and two tablets at bedtime)     metoprolol  succinate (TOPROL -XL) 25 MG 24 hr tablet Take 0.5 tablets (12.5 mg total) by mouth daily. 45 tablet 0   nitroGLYCERIN  (NITROSTAT ) 0.4 MG SL tablet Place 1 tablet (0.4 mg total) under the tongue every 5 (five) minutes as needed for chest pain. 25 tablet 11   pramipexole  (MIRAPEX ) 0.5 MG tablet TAKE 1 TABLET AT DINNER AND 1 TABLET AT BEDTIME. (Patient taking differently: daily. TAKE 1 TABLET AT DINNER AND 1 TABLET AT BEDTIME.) 180 tablet 0   prasugrel  (EFFIENT ) 10 MG TABS tablet Take 1 tablet (10 mg total) by mouth daily. 180 tablet 0   traMADol  (ULTRAM ) 50 MG tablet Take 1 tablet (50 mg total) by mouth daily as needed for severe pain (pain score 7-10). 30 tablet 0   zolpidem  (AMBIEN ) 10 MG tablet Take 1 tablet (10 mg total) by mouth at bedtime. 30 tablet 0   No  facility-administered medications prior to visit.     Review of Systems:   Constitutional: No weight loss or gain, night sweats, fevers, chills, or lassitude. +fatigue (improved) HEENT: No headaches CV:  No chest pain, orthopnea, PND Resp: +snoring (improved) GU: No nocturia  MSK:  +RLS Neuro: No memory impairment  Psych: No depression or anxiety. Mood stable. +sleep disturbance     Physical Exam:  BP 132/64 (BP Location: Right Arm, Patient Position: Sitting, Cuff Size: Normal)   Pulse 68   Ht 5' 8 (1.727 m)   Wt 203 lb (92.1 kg)   SpO2 99%   BMI 30.87 kg/m   GEN: Pleasant, interactive, well-appearing; obese; in no acute distress HEENT:  Normocephalic and atraumatic. PERRLA. Sclera white. Nasal turbinates pink, moist and patent bilaterally. No rhinorrhea present. Oropharynx pink and moist, without exudate or edema. No lesions, ulcerations, or postnasal drip.  NECK:  Supple w/  fair ROM. Thyroid  symmetrical with no goiter or nodules palpated. No lymphadenopathy.   CV: RRR, no m/r/g, no peripheral edema. Pulses intact, +2 bilaterally. No cyanosis, pallor or clubbing. PULMONARY:  Unlabored, regular breathing. Clear bilaterally A&P w/o wheezes/rales/rhonchi. No accessory muscle use.  GI: BS present and normoactive. Soft, non-tender to palpation.  MSK: No erythema, warmth or tenderness. Cap refil <2 sec all extrem.  Neuro: A/Ox3. No focal deficits noted.   Skin: Warm, no lesions or rashe Psych: Normal affect and behavior. Judgement and thought content appropriate.     Lab Results:  CBC    Component Value Date/Time   WBC 5.8 12/07/2023 0401   RBC 3.66 (L) 12/07/2023 0401   HGB 11.1 (L) 12/07/2023 0401   HCT 32.9 (L) 12/07/2023 0401   PLT 140 (L) 12/07/2023 0401   MCV 89.9 12/07/2023 0401   MCH 30.3 12/07/2023 0401   MCHC 33.7 12/07/2023 0401   RDW 12.9 12/07/2023 0401   LYMPHSABS 2.4 05/01/2021 1635   MONOABS 0.9 05/01/2021 1635   EOSABS 0.2 05/01/2021 1635    BASOSABS 0.1 05/01/2021 1635    BMET    Component Value Date/Time   NA 138 12/07/2023 0402   NA 145 (H) 11/27/2019 1002   K 4.0 12/07/2023 0402   CL 105 12/07/2023 0402   CO2 23 12/07/2023 0402   GLUCOSE 103 (H) 12/07/2023 0402   BUN 17 12/07/2023 0402   BUN 16 11/27/2019 1002   CREATININE 1.45 (H) 12/07/2023 0402   CALCIUM  8.7 (L) 12/07/2023 0402   GFRNONAA 51 (L) 12/07/2023 0402   GFRAA 53 (L) 11/27/2019 1002    BNP No results found for: BNP   Imaging:  No results found.  Administration History     None           No data to display          No results found for: NITRICOXIDE      Assessment & Plan:   OSA (obstructive sleep apnea) Severe OSA, intolerant of CPAP. Treated with Inspire. Complex hx following implantation with persistent events on Electrode A and Electrode B. Decreased residual events on Electrode C on most recent HST; mildly moderate OSA. Further adjustments to voltage have been made since then. Appears to be receiving benefit from use now. Tongue movement assessed with good protrusion. Changed to 1.1 V (level 3). Advised to work up to level 4 over the next few weeks, if tolerable. If not, he will remain at a level 3 or back down to his level 2. He will notify once he is on a setting that he is comfortable at and we will plan his repeat HST. Safe driving practices reviewed. Aware of risks of untreated OSA.  Waveform and usage reviewed. He is utilizing nightly but RLS impacts length of sleep some nights.   Patient Instructions  Continue gabapentin  300 mg in afternoon then 900 mg at bedtime. If this doesn't help more than the 600 mg, go back to the 600 mg at bedtime  Continue pramipexole  1 tablet at dinner and 1 tablet at dinner  Continue duloxetine  two capsules daily   Continue Inspire nightly. Step up by 1 level every week until you are at a level 4, if tolerating. If uncomfortable, step back down and stay at that level. Let me know once you  are on a comfortable setting and we will repeat your home sleep study   Check with this website for local specialist on the Nidra device  [email or web address]  Keep appt with RLS specialist   Can look at dry needling also   Labs today   Follow up in 8 weeks with Dr. Neda or Izetta Halimah Bewick,NP. If symptoms do not improve or worsen, please contact office for sooner follow up or seek emergency care.    Restless leg syndrome Refractory RLS despite multiple medications. Recommend he increase bedtime dose to 900 mg; keep 8 hr between dosing. Discussed alternative therapies such as dry needling and possible treatment with Nidra device. Provided with information on this, as I am not familiar with it but there are local specialists. He will investigate this further as a potential option. Encouraged to keep appt with specialist at [facility]. Recheck iron panel given progression - consider iron infusions if indicated.    Advised if symptoms do not improve or worsen, to please contact office for sooner follow up or seek emergency care.   I spent 50 minutes of dedicated to the care of this patient on the date of this encounter to include pre-visit review of records, face-to-face time with the patient discussing conditions above, post visit ordering of testing, clinical documentation with the electronic health record, making appropriate referrals as documented, and communicating necessary findings to members of the patients care team.  Comer LULLA Rouleau, NP 05/11/2024  Pt aware and understands NP's role.   "

## 2024-05-10 NOTE — Assessment & Plan Note (Signed)
  Changed Inspire to 1.1 V

## 2024-05-11 ENCOUNTER — Telehealth: Payer: Self-pay

## 2024-05-11 ENCOUNTER — Encounter: Payer: Self-pay | Admitting: Nurse Practitioner

## 2024-05-11 LAB — IRON,TIBC AND FERRITIN PANEL
%SAT: 19 % — ABNORMAL LOW (ref 20–48)
Ferritin: 62 ng/mL (ref 24–380)
Iron: 70 ug/dL (ref 50–180)
TIBC: 372 ug/dL (ref 250–425)

## 2024-05-11 NOTE — Assessment & Plan Note (Signed)
 Refractory RLS despite multiple medications. Recommend he increase bedtime dose to 900 mg; keep 8 hr between dosing. Discussed alternative therapies such as dry needling and possible treatment with Nidra device. Provided with information on this, as I am not familiar with it but there are local specialists. He will investigate this further as a potential option. Encouraged to keep appt with specialist at [facility]. Recheck iron panel given progression - consider iron infusions if indicated.

## 2024-05-11 NOTE — Telephone Encounter (Signed)
 Copied from CRM [id number]. Topic: General - Call Back - No Documentation >> May 11, 2024 11:20 AM Montie POUR wrote: Reason for CRM:  [name] is calling back and no documentation. Please call him if needed. Thanks >> May 11, 2024 11:23 AM Montie POUR wrote: He was Dr. Cleo patient.

## 2024-05-12 ENCOUNTER — Telehealth: Payer: Self-pay

## 2024-05-12 ENCOUNTER — Other Ambulatory Visit: Payer: Self-pay

## 2024-05-12 ENCOUNTER — Ambulatory Visit: Payer: Self-pay | Admitting: Nurse Practitioner

## 2024-05-12 DIAGNOSIS — N189 Chronic kidney disease, unspecified: Secondary | ICD-10-CM | POA: Diagnosis not present

## 2024-05-12 DIAGNOSIS — I10 Essential (primary) hypertension: Secondary | ICD-10-CM

## 2024-05-12 DIAGNOSIS — G2581 Restless legs syndrome: Secondary | ICD-10-CM

## 2024-05-12 DIAGNOSIS — G4733 Obstructive sleep apnea (adult) (pediatric): Secondary | ICD-10-CM

## 2024-05-12 MED ORDER — METOPROLOL SUCCINATE ER 25 MG PO TB24: 12.5000 mg | ORAL_TABLET | Freq: Every day | ORAL | 1 refills | Status: DC

## 2024-05-12 NOTE — Progress Notes (Signed)
 Iron, TIBC and ferritin all normal. No indication for iron supplementation. Thanks.

## 2024-05-16 DIAGNOSIS — N1831 Chronic kidney disease, stage 3a: Secondary | ICD-10-CM | POA: Diagnosis not present

## 2024-05-16 DIAGNOSIS — E875 Hyperkalemia: Secondary | ICD-10-CM | POA: Diagnosis not present

## 2024-05-16 DIAGNOSIS — G4733 Obstructive sleep apnea (adult) (pediatric): Secondary | ICD-10-CM | POA: Diagnosis not present

## 2024-05-16 DIAGNOSIS — I129 Hypertensive chronic kidney disease with stage 1 through stage 4 chronic kidney disease, or unspecified chronic kidney disease: Secondary | ICD-10-CM | POA: Diagnosis not present

## 2024-05-16 MED ORDER — GABAPENTIN 300 MG PO CAPS: ORAL_CAPSULE | ORAL | 5 refills | Status: DC

## 2024-05-16 NOTE — Telephone Encounter (Signed)
 I refilled his gabapentin  with adjust dosing of 300 mg in afternoon and 900 mg at bedtime. Thanks

## 2024-05-19 ENCOUNTER — Other Ambulatory Visit: Payer: Self-pay

## 2024-05-19 ENCOUNTER — Telehealth: Payer: Self-pay

## 2024-05-19 DIAGNOSIS — G4700 Insomnia, unspecified: Secondary | ICD-10-CM

## 2024-05-19 MED ORDER — ZOLPIDEM TARTRATE 10 MG PO TABS: 10.0000 mg | ORAL_TABLET | Freq: Every day | ORAL | 0 refills | Status: DC

## 2024-05-19 NOTE — Telephone Encounter (Unsigned)
 Copied from CRM [id number]. Topic: Clinical - Prescription Issue >> May 19, 2024  3:42 PM Tiffini S wrote: Reason for CRM: Patient called stating that the pharmacy have faxed medication refill requests to refill zolpidem  (AMBIEN ) 10 MG tablet starting on 05/15/24- that he has been without his medication for 5 days. Spoke with CAL, Kaylen- will work with nurse to get medication refilled and call the patient back today before 5pm at [phone].

## 2024-05-21 ENCOUNTER — Other Ambulatory Visit: Payer: Self-pay | Admitting: Internal Medicine

## 2024-05-21 DIAGNOSIS — F321 Major depressive disorder, single episode, moderate: Secondary | ICD-10-CM

## 2024-05-22 ENCOUNTER — Other Ambulatory Visit: Payer: Self-pay

## 2024-05-22 DIAGNOSIS — L814 Other melanin hyperpigmentation: Secondary | ICD-10-CM | POA: Diagnosis not present

## 2024-05-22 DIAGNOSIS — D485 Neoplasm of uncertain behavior of skin: Secondary | ICD-10-CM | POA: Diagnosis not present

## 2024-05-22 DIAGNOSIS — L821 Other seborrheic keratosis: Secondary | ICD-10-CM | POA: Diagnosis not present

## 2024-05-22 DIAGNOSIS — D225 Melanocytic nevi of trunk: Secondary | ICD-10-CM | POA: Diagnosis not present

## 2024-05-24 ENCOUNTER — Other Ambulatory Visit: Payer: Self-pay

## 2024-05-24 DIAGNOSIS — G2581 Restless legs syndrome: Secondary | ICD-10-CM

## 2024-05-24 DIAGNOSIS — M4802 Spinal stenosis, cervical region: Secondary | ICD-10-CM

## 2024-05-24 DIAGNOSIS — M48062 Spinal stenosis, lumbar region with neurogenic claudication: Secondary | ICD-10-CM

## 2024-05-25 NOTE — Telephone Encounter (Signed)
 I'm circling back with the [facility] team and Nidra rep, as it states on the Nidra information packet that an implanted device can be a contraindication to use. I want to ensure his Elizabeth won't be impacting by the Nidra before we move forward. I will update as soon as I have an answer. I do have all of the prescribing information. Thanks!

## 2024-05-29 DIAGNOSIS — H47012 Ischemic optic neuropathy, left eye: Secondary | ICD-10-CM | POA: Diagnosis not present

## 2024-05-29 DIAGNOSIS — Z961 Presence of intraocular lens: Secondary | ICD-10-CM | POA: Diagnosis not present

## 2024-05-29 DIAGNOSIS — H15122 Nodular episcleritis, left eye: Secondary | ICD-10-CM | POA: Diagnosis not present

## 2024-05-31 NOTE — Telephone Encounter (Signed)
**Note De-identified  [place]bright Obfuscation** Please advise 

## 2024-06-01 NOTE — Telephone Encounter (Signed)
 Pt at level 4 on Inspire. HST ordered - will use Inspire at current level night of the study. Please let PCCs know so they can schedule him with one of our [employer].   Also, please notify pt that an implanted device is technically a contraindication for use with the Nidra device for RLS. They are currently studying Nidra in pt's with Inspire; however, they do not have any formal testing on this yet. I don't think it would impact his Inspire, as it's only on for 30 minutes-1 hour. It's possible that it could turn the Inspire on, due to high frequency, but I don't see any other data that it could cause other issues. I have one of the Inspire reps looking into it with their clinical team. There are providers/patients out there who are using both and doing well, but again, has not been studied and at this time considered contraindicated. Will keep him posted. Thanks for being patient.

## 2024-06-01 NOTE — Addendum Note (Signed)
 Addended by: Lorrane Mccay V on: 06/01/2024 10:33 AM   Modules accepted: Orders

## 2024-06-07 DIAGNOSIS — Z961 Presence of intraocular lens: Secondary | ICD-10-CM | POA: Diagnosis not present

## 2024-06-07 DIAGNOSIS — H15122 Nodular episcleritis, left eye: Secondary | ICD-10-CM | POA: Diagnosis not present

## 2024-06-09 ENCOUNTER — Other Ambulatory Visit: Payer: Self-pay | Admitting: Internal Medicine

## 2024-06-09 DIAGNOSIS — I2510 Atherosclerotic heart disease of native coronary artery without angina pectoris: Secondary | ICD-10-CM

## 2024-06-09 DIAGNOSIS — E782 Mixed hyperlipidemia: Secondary | ICD-10-CM

## 2024-06-09 DIAGNOSIS — Z789 Other specified health status: Secondary | ICD-10-CM

## 2024-06-09 DIAGNOSIS — I214 Non-ST elevation (NSTEMI) myocardial infarction: Secondary | ICD-10-CM

## 2024-06-13 ENCOUNTER — Other Ambulatory Visit: Payer: Self-pay

## 2024-06-13 DIAGNOSIS — G4700 Insomnia, unspecified: Secondary | ICD-10-CM

## 2024-06-14 DIAGNOSIS — H47012 Ischemic optic neuropathy, left eye: Secondary | ICD-10-CM | POA: Diagnosis not present

## 2024-06-14 DIAGNOSIS — H15122 Nodular episcleritis, left eye: Secondary | ICD-10-CM | POA: Diagnosis not present

## 2024-06-14 DIAGNOSIS — Z961 Presence of intraocular lens: Secondary | ICD-10-CM | POA: Diagnosis not present

## 2024-06-19 ENCOUNTER — Other Ambulatory Visit: Payer: Self-pay

## 2024-06-19 NOTE — Telephone Encounter (Signed)
FYI for upcoming visit 

## 2024-06-19 NOTE — Telephone Encounter (Signed)
 We do not prescribe narcotic pain medication so that will have to be managed by his furture PCP. I'm ok with refilling his zolpidem  at our follow up.  Regarding the Nidra device, which we have previously discussed, it is technically contraindicated in patients with an implanted device (I.e. his Inspire); however, I've had lengthy discussions with our Inspire team and Dr. Neda and we cannot seem to find a harm that using both would cause. They are currently studying this but nothing has been published thus far so no known data. The high frequency could turn on his Inspire, but the Nidra is used intermittently, not continuously, and his Elizabeth should already be active as he would be falling asleep with the Nidra. If it impacted efficacy of the Inspire, then we would need to stop therapy. He would need to understand that this is possible, which could result in poor control of his OSA. We can discuss further at his follow up and if he decides he would like to move forward, I can start the process. Thanks.

## 2024-06-29 ENCOUNTER — Ambulatory Visit

## 2024-06-30 ENCOUNTER — Other Ambulatory Visit: Payer: Self-pay

## 2024-06-30 DIAGNOSIS — I2510 Atherosclerotic heart disease of native coronary artery without angina pectoris: Secondary | ICD-10-CM

## 2024-06-30 MED ORDER — PRASUGREL HCL 10 MG PO TABS: 10.0000 mg | ORAL_TABLET | Freq: Every day | ORAL | 1 refills | Status: AC

## 2024-07-04 DIAGNOSIS — M1711 Unilateral primary osteoarthritis, right knee: Secondary | ICD-10-CM | POA: Diagnosis not present

## 2024-07-05 ENCOUNTER — Ambulatory Visit

## 2024-07-05 DIAGNOSIS — G4733 Obstructive sleep apnea (adult) (pediatric): Secondary | ICD-10-CM

## 2024-07-07 ENCOUNTER — Ambulatory Visit: Admitting: Nurse Practitioner

## 2024-07-13 ENCOUNTER — Other Ambulatory Visit: Payer: Self-pay

## 2024-07-13 DIAGNOSIS — G4700 Insomnia, unspecified: Secondary | ICD-10-CM

## 2024-07-13 MED ORDER — ZOLPIDEM TARTRATE 10 MG PO TABS: 10.0000 mg | ORAL_TABLET | Freq: Every day | ORAL | 0 refills | Status: DC

## 2024-07-19 DIAGNOSIS — G4733 Obstructive sleep apnea (adult) (pediatric): Secondary | ICD-10-CM | POA: Diagnosis not present

## 2024-07-19 DIAGNOSIS — D520 Dietary folate deficiency anemia: Secondary | ICD-10-CM | POA: Diagnosis not present

## 2024-07-19 DIAGNOSIS — E559 Vitamin D deficiency, unspecified: Secondary | ICD-10-CM | POA: Diagnosis not present

## 2024-07-19 DIAGNOSIS — D508 Other iron deficiency anemias: Secondary | ICD-10-CM | POA: Diagnosis not present

## 2024-07-20 DIAGNOSIS — M545 Low back pain, unspecified: Secondary | ICD-10-CM | POA: Diagnosis not present

## 2024-07-24 NOTE — Telephone Encounter (Signed)
 FYI

## 2024-07-26 DIAGNOSIS — G4733 Obstructive sleep apnea (adult) (pediatric): Secondary | ICD-10-CM | POA: Diagnosis not present

## 2024-07-31 DIAGNOSIS — G5601 Carpal tunnel syndrome, right upper limb: Secondary | ICD-10-CM | POA: Diagnosis not present

## 2024-07-31 DIAGNOSIS — E611 Iron deficiency: Secondary | ICD-10-CM | POA: Diagnosis not present

## 2024-07-31 DIAGNOSIS — G4733 Obstructive sleep apnea (adult) (pediatric): Secondary | ICD-10-CM | POA: Diagnosis not present

## 2024-07-31 DIAGNOSIS — I7782 Antineutrophilic cytoplasmic antibody (ANCA) vasculitis: Secondary | ICD-10-CM | POA: Diagnosis not present

## 2024-07-31 DIAGNOSIS — D508 Other iron deficiency anemias: Secondary | ICD-10-CM | POA: Diagnosis not present

## 2024-07-31 DIAGNOSIS — Z79899 Other long term (current) drug therapy: Secondary | ICD-10-CM | POA: Diagnosis not present

## 2024-07-31 DIAGNOSIS — G2581 Restless legs syndrome: Secondary | ICD-10-CM | POA: Diagnosis not present

## 2024-07-31 DIAGNOSIS — G629 Polyneuropathy, unspecified: Secondary | ICD-10-CM | POA: Diagnosis not present

## 2024-08-04 ENCOUNTER — Ambulatory Visit: Admitting: Nurse Practitioner

## 2024-08-04 ENCOUNTER — Encounter: Payer: Self-pay | Admitting: Nurse Practitioner

## 2024-08-04 VITALS — BP 122/78 | HR 81 | Ht 68.0 in | Wt 207.4 lb

## 2024-08-04 DIAGNOSIS — Z9682 Presence of neurostimulator: Secondary | ICD-10-CM

## 2024-08-04 DIAGNOSIS — G2581 Restless legs syndrome: Secondary | ICD-10-CM | POA: Diagnosis not present

## 2024-08-04 DIAGNOSIS — G4733 Obstructive sleep apnea (adult) (pediatric): Secondary | ICD-10-CM | POA: Diagnosis not present

## 2024-08-04 DIAGNOSIS — D509 Iron deficiency anemia, unspecified: Secondary | ICD-10-CM

## 2024-08-04 DIAGNOSIS — G4700 Insomnia, unspecified: Secondary | ICD-10-CM | POA: Diagnosis not present

## 2024-08-04 MED ORDER — FERROUS SULFATE 325 (65 FE) MG PO TABS: 325.0000 mg | ORAL_TABLET | Freq: Two times a day (BID) | ORAL | 5 refills | Status: AC

## 2024-08-04 NOTE — Assessment & Plan Note (Addendum)
 Significant improvement with change to Lyrica. Will add on iron supplementation. Follow up with Dr. Leopoldo as scheduled. Can consider addition of Nidra device if symptoms return and are poorly controlled with pharmacological therapy. Encouraged to exercise regularly.

## 2024-08-04 NOTE — Progress Notes (Signed)
 @Patient  ID: [name], male    DOB: 1950/12/01, 73 y.o.   MRN: [id number]  Chief Complaint  Patient presents with   Sleep Apnea    Inspire f/u.  Sleeping better since seeing neurologist and changing medications.    Referring provider: No ref. provider found  HPI: 73 year old male, former smoker followed for severe OSA treated with Inspire and RLS. He is a patient of Dr. Cathye and last seen in office 05/10/2024 by [facility] NP. Past medical history significant for NSTEMI, CAD, HTN, CKD, IDA.  TEST/EVENTS:  04/06/2022 split night study: AHI 39.5/h, SpO2 low 85% 07/21/2022: DISE with Dr. Carlie; anterior posterior collapse  09/09/2022: Inspire implantation with Dr. Carlie  11/26/2022: Inspire activation  03/10/2023 inspire titration: persistent severe OSA despite Inspire use; increased in supine >> changed to electrode B setting 05/24/2023 HST: AHI 53/h on Inspire electrode B >> changed to electrode C setting 07/28/2023 HST: AHI 15.2/h on Inspire electrode C, 0.8 V 07/05/2024 HST: AHI 27.6/h, SpO2 low 80% on Inspire Electrode C, 1.1 V  03/14/2024: Ov with Dr. Neda. Currently on voltage 0.8; adjusted to 0.9 V. wakes up in the middle of the night and feels the machine is not working well. More sleepiness. Struggles with RLS despite neurontin , mirapex  and cymbalta . Tried requip in past. On C mode, pause 20 min, delay 30 min, 8 hr duration. Still snoring when he's on his back. Encouraged to increase voltage as tolerated. Optimize treatment for RLS. Increase neurontin  to 600 mg at bedtime and 300 in afternoon. Can increase to 900 at night if needed. Will schedule WatchPAT once he is more stable.   05/10/2024: SHERLEAN with Yailene Badia NP [name] Burnard is a 73 year old male with sleep apnea treated with Inspire and restless leg syndrome who presents for follow-up on his sleep apnea management and restless leg symptoms. Since his last visit, he has increased the voltage on his Inspire device to level 2,  which has improved his sleep quality and energy levels during the day. He reports that he is comfortable with the current settings and has not tried higher levels yet. Snoring has decreased. No drowsy driving.  He has a history of restless leg syndrome, which he describes as 'going atomic' and significantly impacting his life, preventing him from planning overnight trips or visiting others. He refers to episodes as 'the storm' and reports that they have been occurring frequently over the past several months. He has tried various treatments in the past, including Requip and another unspecified medication, without success. He currently takes gabapentin  and pramipexole , which initially provided relief but have become less effective over time. He has increased his gabapentin  dose to 600 mg at bedtime and takes 300 mg in the afternoon. He also uses  Cymbalta . Waiting on a specialist appt at [facility] but didn't have openings until February. No issues with drowsiness related to medication use or aberrant behavior.  He has also tried smoking marijuana, which he finds provides some relief during episodes, although he is hesitant about its use 04/10/2024-05/09/2024 Inspire Download 0.9-1.2 V, Electrode C, 90/33  Delay 30 min, pause 20 min, duration 8 hr 29/30 days; 73% >4 hr Avg use 5 hr 15 min Avg pause 0.2 Current setting: level 2 (1 V)  08/04/2024: Today - follow up Patient presents today for follow up. He had a repeat home sleep study recently that revealed worsening control on 1.1 V on Inspire, indicating possible overstimulation at this level. He has  also had medication changes and sleep changes since our last visit. He saw a RLS specialist, who took him off mirapex  and gabapentin  and switched him to pregabalin nightly. He is still taking Cymbalta . He tells me this is working tremendously better for him. He's actually not had any issues with his RLS since making this change. His sleep is more restful and he's  been able to sleep for longer stretches, extending from 4-5 hours to typically about 6-6.5 hr. He does not have any issues with his current Trainer settings. He did briefly try to increase to 1.2V, which felt too strong so he has stayed at 1.1V. He feels his energy levels are better during the day; although still has some fatigue. His wife has not commented on his snoring, seems to be reduced. No drowsy driving. No mood changes with medication change.  He did have low iron levels on prior check. Is not currently on iron supplementation.   07/04/2024-08/03/2024: Inspire Download Electrode C, 90/33, 0.9-1.2 V Current level 3 1.1 V 29/30 days; 87% >4 hr; average use 6 hr 25 min Avg pause 0.2  Ginny with Inspire present  Allergies  Allergen Reactions   Statins Other (See Comments)    Muscle pain   Nsaids Other (See Comments)    Cannot take because of my kidney disease.    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   Sleep apnea    uses Cpap nightly    Tobacco History: Social History   Tobacco Use  Smoking Status Former   Types: Cigars   Passive exposure: Never  Smokeless Tobacco Never   Counseling given: Not Answered   Outpatient Medications Prior to Visit  Medication Sig Dispense Refill   aspirin  EC 81 MG tablet Take 1 tablet (81 mg total) by mouth daily. Swallow whole.      DULoxetine  (CYMBALTA ) 30 MG capsule TAKE TWO CAPSULES BY MOUTH EVERY DAY 60 capsule 2   metoprolol  succinate (TOPROL -XL) 25 MG 24 hr tablet Take 0.5 tablets (12.5 mg total) by mouth daily. 45 tablet 1   nitroGLYCERIN  (NITROSTAT ) 0.4 MG SL tablet Place 1 tablet (0.4 mg total) under the tongue every 5 (five) minutes as needed for chest pain. 25 tablet 11   prasugrel  (EFFIENT ) 10 MG TABS tablet Take 1 tablet (10 mg total) by mouth daily. 180 tablet 1   pregabalin (LYRICA) 150 MG capsule Take 150 mg by mouth 2 (two) times daily.     REPATHA  SURECLICK 140 MG/ML SOAJ Inject 140 mg into the skin every 14 (fourteen) days. 2 mL 2   traMADol  (ULTRAM ) 50 MG tablet Take 1 tablet (50 mg total) by mouth daily as needed for severe pain (pain score 7-10). 30 tablet 0   zolpidem  (AMBIEN ) 10 MG tablet Take 1 tablet (10 mg total) by mouth at bedtime. 30 tablet 0   gabapentin  (NEURONTIN ) 300 MG capsule  Take one tablet (300 mg) in afternoon and then three tablets (900 mg) immediately before bedtime for restless leg syndrome. Do not drive after taking. 120 capsule 5   pramipexole  (MIRAPEX ) 0.5 MG tablet TAKE 1 TABLET AT DINNER AND 1 TABLET AT BEDTIME. (Patient taking differently: daily. TAKE 1 TABLET AT DINNER AND 1 TABLET AT BEDTIME.) 180 tablet 0   No facility-administered medications prior to visit.     Review of Systems: as above    Physical Exam:  BP 122/78 (BP Location: Right Arm, Patient Position: Sitting)   Pulse 81   Ht 5' 8 (1.727 m)   Wt 207 lb 6.4 oz (94.1 kg)   SpO2 98% Comment: RA  BMI 31.54 kg/m   GEN: Pleasant, interactive, well-appearing; obese; in no acute distress HEENT:  Normocephalic and atraumatic. PERRLA. Sclera white. Nasal turbinates pink, moist and patent bilaterally. No rhinorrhea present. Oropharynx pink and moist, without exudate or edema. No lesions, ulcerations, or postnasal drip.  NECK:  Supple w/ fair ROM. Thyroid  symmetrical with no goiter or nodules palpated. No  lymphadenopathy.   CV: RRR, no m/r/g, no peripheral edema. Pulses intact, +2 bilaterally. No cyanosis, pallor or clubbing. PULMONARY:  Unlabored, regular breathing. Clear bilaterally A&P w/o wheezes/rales/rhonchi. No accessory muscle use.  GI: BS present and normoactive. Soft, non-tender to palpation.  MSK: No erythema, warmth or tenderness. Cap refil <2 sec all extrem.  Neuro: A/Ox3. No focal deficits noted.   Skin: Warm, no lesions or rashe Psych: Normal affect and behavior. Judgement and thought content appropriate.     Lab Results:  CBC    Component Value Date/Time   WBC 5.8 12/07/2023 0401   RBC 3.66 (L) 12/07/2023 0401   HGB 11.1 (L) 12/07/2023 0401   HCT 32.9 (L) 12/07/2023 0401   PLT 140 (L) 12/07/2023 0401   MCV 89.9 12/07/2023 0401   MCH 30.3 12/07/2023 0401   MCHC 33.7 12/07/2023 0401   RDW 12.9 12/07/2023 0401   LYMPHSABS 2.4 05/01/2021 1635   MONOABS 0.9 05/01/2021 1635   EOSABS 0.2 05/01/2021 1635   BASOSABS 0.1 05/01/2021 1635    BMET    Component Value Date/Time   NA 138 12/07/2023 0402   NA 145 (H) 11/27/2019 1002   K 4.0 12/07/2023 0402   CL 105 12/07/2023 0402   CO2 23 12/07/2023 0402   GLUCOSE 103 (H) 12/07/2023 0402   BUN 17 12/07/2023 0402   BUN 16 11/27/2019 1002   CREATININE 1.45 (H) 12/07/2023 0402   CALCIUM  8.7 (L) 12/07/2023 0402   GFRNONAA 51 (L) 12/07/2023 0402   GFRAA 53 (L) 11/27/2019 1002    BNP No results found for: BNP   Imaging:  No results found.  Administration History     None           No data to display          No results found for: NITRICOXIDE      Assessment & Plan:   OSA (obstructive sleep apnea) Severe OSA, intolerant of CPAP. Treated with Inspire. Complex hx following implantation with persistent events on Electrode A and Electrode B. Decreased residual events on Electrode C on repeat HST; mildly moderate OSA. Further adjustments to voltage have been made since then due to lack of  perceived benefit. Appears to be receiving benefit from use now but recent HST indicates possible overstimulation with increased residual AHI of 26/h. He also has other comorbid conditions that disrupt sleep including significant RLS and insomnia. His RLS  has since been better managed, resulting in improved sleep quality, and could have resulted in threshold change. Tongue movement assessed with good protrusion. Changed to 0.9 V and adjusted to range of 0.7-1.1 V. (level 3). Advised to remain at this level. Will reassess benefit at follow up and consider repeat HST at this time. Safe driving practices reviewed. Aware of risks of untreated OSA.  Waveform and usage reviewed. He is utilizing nightly with an increase of over an hour on average since RLS has been better managed.   Patient Instructions  Continue pregabalin nightly  Continue duloxetine  two capsules daily   Take iron supplement Twice daily with food. Do not take with dairy products    Continue Inspire nightly. Stay on the level you are on, which is level 3. Call me if you have any issues with discomfort    Follow up with RLS specialist    Follow up 11/21 at 10 am with Katie Hansel Devan,NP. If symptoms do not improve or worsen, please contact office for sooner follow up or seek emergency care.    Restless leg syndrome Significant improvement with change to Lyrica. Will add on iron supplementation. Follow up with Dr. Leopoldo as scheduled. Can consider addition of Nidra device if symptoms return and are poorly controlled with pharmacological therapy. Encouraged to exercise regularly.   Insomnia Stable on current regimen. Sleep hygiene reviewed. No sleep parasomnias with zolpidem   Iron deficiency anemia Add on ferrous sulfate 325 mg Twice daily and recheck iron levels. Side effect profile reviewed.     Advised if symptoms do not improve or worsen, to please contact office for sooner follow up or seek emergency care.   I spent 35 minutes of  dedicated to the care of this patient on the date of this encounter to include pre-visit review of records, face-to-face time with the patient discussing conditions above, post visit ordering of testing, clinical documentation with the electronic health record, making appropriate referrals as documented, and communicating necessary findings to members of the patients care team.  Comer LULLA Rouleau, NP 08/04/2024  Pt aware and understands NP's role.

## 2024-08-04 NOTE — Assessment & Plan Note (Signed)
 Stable on current regimen. Sleep hygiene reviewed. No sleep parasomnias with zolpidem 

## 2024-08-04 NOTE — Assessment & Plan Note (Signed)
 Severe OSA, intolerant of CPAP. Treated with Inspire. Complex hx following implantation with persistent events on Electrode A and Electrode B. Decreased residual events on Electrode C on repeat HST; mildly moderate OSA. Further adjustments to voltage have been made since then due to lack of perceived benefit. Appears to be receiving benefit from use now but recent HST indicates possible overstimulation with increased residual AHI of 26/h. He also has other comorbid conditions that disrupt sleep including significant RLS and insomnia. His RLS has since been better managed, resulting in improved sleep quality, and could have resulted in threshold change. Tongue movement assessed with good protrusion. Changed to 0.9 V and adjusted to range of 0.7-1.1 V. (level 3). Advised to remain at this level. Will reassess benefit at follow up and consider repeat HST at this time. Safe driving practices reviewed. Aware of risks of untreated OSA.  Waveform and usage reviewed. He is utilizing nightly with an increase of over an hour on average since RLS has been better managed.   Patient Instructions  Continue pregabalin nightly  Continue duloxetine  two capsules daily   Take iron supplement Twice daily with food. Do not take with dairy products    Continue Inspire nightly. Stay on the level you are on, which is level 3. Call me if you have any issues with discomfort    Follow up with RLS specialist    Follow up 11/21 at 10 am with Katie Aleyah Balik,NP. If symptoms do not improve or worsen, please contact office for sooner follow up or seek emergency care.

## 2024-08-04 NOTE — Assessment & Plan Note (Signed)
 Add on ferrous sulfate 325 mg Twice daily and recheck iron levels. Side effect profile reviewed.

## 2024-08-04 NOTE — Patient Instructions (Addendum)
 Continue pregabalin nightly  Continue duloxetine  two capsules daily   Take iron supplement Twice daily with food. Do not take with dairy products    Continue Inspire nightly. Stay on the level you are on, which is level 3. Call me if you have any issues with discomfort    Follow up with RLS specialist    Follow up 11/21 at 10 am with Katie Cobb,NP. If symptoms do not improve or worsen, please contact office for sooner follow up or seek emergency care.

## 2024-08-08 DIAGNOSIS — M25551 Pain in right hip: Secondary | ICD-10-CM | POA: Diagnosis not present

## 2024-08-11 DIAGNOSIS — M1711 Unilateral primary osteoarthritis, right knee: Secondary | ICD-10-CM | POA: Diagnosis not present

## 2024-08-11 DIAGNOSIS — Z96652 Presence of left artificial knee joint: Secondary | ICD-10-CM | POA: Diagnosis not present

## 2024-08-14 ENCOUNTER — Telehealth: Payer: Self-pay

## 2024-08-14 NOTE — Telephone Encounter (Signed)
   Pre-operative Risk Assessment    Patient Name: [name]  DOB: 04/18/1951 MRN: [id number]   Date of last office visit: 04/18/24, Dr. Alm Clay, MD Date of next office visit: 08/28/2024, Johnny Sluder, PA-C   Request for Surgical Clearance    Procedure:  Right partial knee arthroplasty   Date of Surgery:  Clearance 11/21/24                                Surgeon: Dr. Donnice Car, MD Surgeon's Group or Practice Name: [facility] Phone number: [phone] Fax number: [phone]   Type of Clearance Requested:   - Medical  - Pharmacy:  Hold Aspirin      Type of Anesthesia:  Spinal   Additional requests/questions:    SignedAsberry KANDICE Dunning   08/14/2024, 2:13 PM

## 2024-08-14 NOTE — Telephone Encounter (Signed)
   Name: [name]  DOB: 29-Jul-1951  MRN: [id number]  Primary Cardiologist: Alm Clay, MD  Chart reviewed as part of pre-operative protocol coverage. The patient has an upcoming visit scheduled with Thom Sluder, PA on 08/28/24 at which time clearance can be addressed in case there are any issues that would impact surgical recommendations.  Right partial knee arthroplasty is not scheduled as below. I added preop FYI to appointment note so that provider is aware to address at time of outpatient visit.  Per office protocol the cardiology provider should forward their finalized clearance decision and recommendations regarding antiplatelet therapy to the requesting party below.    I will route this message as FYI to requesting party and remove this message from the preop box as separate preop APP input not needed at this time.   Please call with any questions.  Zofia Peckinpaugh D Romie Keeble, NP  08/14/2024, 3:42 PM

## 2024-08-15 ENCOUNTER — Telehealth: Payer: Self-pay

## 2024-08-15 ENCOUNTER — Other Ambulatory Visit: Payer: Self-pay

## 2024-08-15 DIAGNOSIS — G2581 Restless legs syndrome: Secondary | ICD-10-CM

## 2024-08-15 DIAGNOSIS — G4700 Insomnia, unspecified: Secondary | ICD-10-CM

## 2024-08-15 MED ORDER — TRAMADOL HCL 50 MG PO TABS: 50.0000 mg | ORAL_TABLET | Freq: Every day | ORAL | 0 refills | Status: DC | PRN

## 2024-08-15 MED ORDER — ZOLPIDEM TARTRATE 10 MG PO TABS: 10.0000 mg | ORAL_TABLET | Freq: Every day | ORAL | 0 refills | Status: DC

## 2024-08-15 NOTE — Telephone Encounter (Signed)
 Ambien  refills sent to [facility]

## 2024-08-15 NOTE — Telephone Encounter (Signed)
Refill request sent to [facility].

## 2024-08-15 NOTE — Telephone Encounter (Signed)
 Patient advised.

## 2024-08-15 NOTE — Telephone Encounter (Signed)
 Copied from CRM #[id number]. Topic: Clinical - Medication Refill >> Aug 15, 2024 10:57 AM Amber H wrote: Medication: zolpidem  (AMBIEN ) 10 MG tablet and traMADol  (ULTRAM ) 50 MG tablet  Has the patient contacted their pharmacy? Yes, stated that they have sent 2 faxes for the refills. (Agent: If no, request that the patient contact the pharmacy for the refill. If patient does not wish to contact the pharmacy document the reason why and proceed with request.) (Agent: If yes, when and what did the pharmacy advise?)  This is the patient's preferred pharmacy:  [facility] [place], [place] - [id number] [place] [address] Phone: [phone] Fax: [phone]   Is this the correct pharmacy for this prescription? Yes If no, delete pharmacy and type the correct one.   Has the prescription been filled recently? Yes  Is the patient out of the medication? No, ran out on 08/12/2024  Has the patient been seen for an appointment in the last year OR does the patient have an upcoming appointment? Yes  Can we respond through MyChart? Yes  Agent: Please be advised that Rx refills may take up to 3 business days. We ask that you follow-up with your pharmacy.

## 2024-08-15 NOTE — Telephone Encounter (Signed)
 Medical clearance form for total knee [facility]. Noted Copied Sleeved Original placed front desk completed folder Copy placed front desk copy folder Surgery scheduled in 10/2024. Appt. 09/25/2024.

## 2024-08-15 NOTE — Telephone Encounter (Signed)
 Prescription Request  08/15/2024  LOV: Visit date not found  What is the name of the medication or equipment? zolpidem  (AMBIEN ) 10 MG tablet [[id number]]   zolpidem  (AMBIEN ) 10 MG tablet [[id number]]   Have you contacted your pharmacy to request a refill? Yes   Which pharmacy would you like this sent to?  [facility] pharmacy   Patient notified that their request is being sent to the clinical staff for review and that they should receive a response within 2 business days.   Please advise at Mobile [phone] (mobile)

## 2024-08-17 NOTE — Telephone Encounter (Signed)
 duplicate

## 2024-08-25 ENCOUNTER — Other Ambulatory Visit: Payer: Self-pay

## 2024-08-25 DIAGNOSIS — F321 Major depressive disorder, single episode, moderate: Secondary | ICD-10-CM

## 2024-08-28 ENCOUNTER — Ambulatory Visit

## 2024-08-28 ENCOUNTER — Ambulatory Visit: Attending: Cardiology | Admitting: Cardiology

## 2024-08-28 ENCOUNTER — Telehealth: Payer: Self-pay

## 2024-08-28 ENCOUNTER — Encounter: Payer: Self-pay | Admitting: Cardiology

## 2024-08-28 VITALS — BP 114/72 | HR 67 | Ht 68.0 in | Wt 209.2 lb

## 2024-08-28 VITALS — BP 116/60 | HR 86 | Resp 16 | Ht 68.0 in | Wt 209.0 lb

## 2024-08-28 DIAGNOSIS — T466X5D Adverse effect of antihyperlipidemic and antiarteriosclerotic drugs, subsequent encounter: Secondary | ICD-10-CM | POA: Diagnosis not present

## 2024-08-28 DIAGNOSIS — Z0000 Encounter for general adult medical examination without abnormal findings: Secondary | ICD-10-CM | POA: Diagnosis not present

## 2024-08-28 DIAGNOSIS — I77810 Thoracic aortic ectasia: Secondary | ICD-10-CM | POA: Diagnosis not present

## 2024-08-28 DIAGNOSIS — G720 Drug-induced myopathy: Secondary | ICD-10-CM | POA: Diagnosis not present

## 2024-08-28 DIAGNOSIS — T466X5A Adverse effect of antihyperlipidemic and antiarteriosclerotic drugs, initial encounter: Secondary | ICD-10-CM | POA: Diagnosis not present

## 2024-08-28 DIAGNOSIS — Z2821 Immunization not carried out because of patient refusal: Secondary | ICD-10-CM | POA: Diagnosis not present

## 2024-08-28 DIAGNOSIS — N1831 Chronic kidney disease, stage 3a: Secondary | ICD-10-CM | POA: Diagnosis not present

## 2024-08-28 DIAGNOSIS — Z01810 Encounter for preprocedural cardiovascular examination: Secondary | ICD-10-CM | POA: Diagnosis not present

## 2024-08-28 DIAGNOSIS — G4733 Obstructive sleep apnea (adult) (pediatric): Secondary | ICD-10-CM | POA: Insufficient documentation

## 2024-08-28 DIAGNOSIS — I10 Essential (primary) hypertension: Secondary | ICD-10-CM | POA: Insufficient documentation

## 2024-08-28 DIAGNOSIS — E785 Hyperlipidemia, unspecified: Secondary | ICD-10-CM | POA: Diagnosis not present

## 2024-08-28 DIAGNOSIS — Q2543 Congenital aneurysm of aorta: Secondary | ICD-10-CM | POA: Diagnosis not present

## 2024-08-28 DIAGNOSIS — I2510 Atherosclerotic heart disease of native coronary artery without angina pectoris: Secondary | ICD-10-CM | POA: Insufficient documentation

## 2024-08-28 NOTE — Progress Notes (Signed)
 Subjective:   [name] is a 73 y.o. male who presents for a Medicare Annual Wellness Visit.  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   Sleep apnea    uses Cpap nightly   Past Surgical History:  Procedure Laterality Date   BACK SURGERY     lumbar 2020   CATARACT EXTRACTION     10-15 years ago   COLONOSCOPY  05/25/2012   Procedure: COLONOSCOPY;  Surgeon: Claudis RAYMOND Rivet, MD;  [facility]: [facility];  Service: Endoscopy;  Laterality: N/A;  1030   COLONOSCOPY N/A 05/14/2017   Procedure: COLONOSCOPY;  Surgeon: Rivet Claudis RAYMOND, MD;  [facility]: [facility];  Service: Endoscopy;  Laterality: N/A;  10:10   CORONARY PRESSURE/FFR WITH 3D MAPPING N/A 12/06/2023   Procedure: Coronary Pressure/FFR w/3D Mapping;  Surgeon: Anner Alm ORN, MD;  Location: [facility] INVASIVE CV LAB;  Service: Cardiovascular;  Laterality: N/A;   CORONARY STENT INTERVENTION N/A 12/06/2023   Procedure: CORONARY STENT INTERVENTION;  Surgeon: Anner Alm ORN, MD;  Location: [facility] INVASIVE CV LAB;  Service: Cardiovascular;  Laterality: N/A;   CYSTOSCOPY/RETROGRADE/URETEROSCOPY Right 06/07/2014   Procedure: CYSTOSCOPY, RIGHT RETROGRADE, RIGHT URETEROSCOPY, RIGHT URETERAL BALLOON DILATATION, BIOPSY, RIGHT URETERAL STENT;  Surgeon: Garnette CHRISTELLA Shack, MD;  Location: [facility];  Service: Urology;  Laterality: Right;   DRUG INDUCED ENDOSCOPY Right 07/21/2022   Procedure: DRUG INDUCED ENDOSCOPY;  Surgeon: Carlie Clark, MD;  Location: [facility];  Service: ENT;  Laterality: Right;   I & D EXTREMITY Left 05/01/2021   Procedure: LEFT INDEX AND LONG FINGER AMPUTATION AND RING FINGER NAIL BED  REPAIR.;  Surgeon: Murrell Drivers, MD;  Location: [facility];  Service: Orthopedics;  Laterality: Left;  LEFT INDEX AND LONG FINGER AMPUTATION AND RING FINGER NAIL BED REPAIR.   IMPLANTATION OF HYPOGLOSSAL NERVE STIMULATOR Right 09/09/2022   Procedure: IMPLANTATION OF HYPOGLOSSAL NERVE STIMULATOR;  Surgeon: Carlie Clark, MD;  Location: [facility];  Service: ENT;  Laterality: Right;   kidney stone  1983   Had partial right nephrectomy   KNEE ARTHROSCOPY WITH MEDIAL MENISECTOMY Right 04/14/2018   Procedure: KNEE ARTHROSCOPY WITH MEDIAL MENISECTOMY;  Surgeon: Margrette Taft BRAVO, MD;  Location: [facility];  Service: Orthopedics;  Laterality: Right;   left foot surgery  2010   repair of joint   LEFT HEART CATH AND CORONARY ANGIOGRAPHY N/A 12/06/2023   Procedure: LEFT HEART CATH AND CORONARY ANGIOGRAPHY;  Surgeon: Anner Alm ORN, MD;  Location: [facility] INVASIVE CV LAB;  Service: Cardiovascular;  Laterality: N/A;   PARTIAL KNEE ARTHROPLASTY Left 09/16/2021   Procedure: UNICOMPARTMENTAL KNEE MEDIALLY;  Surgeon: Ernie Cough, MD;  Location: [facility];  Service: Orthopedics;  Laterality: Left;   WRIST SURGERY Bilateral    Family History  Problem Relation Age of Onset   Cancer Father        Bladder cancer   Lung cancer Brother    Healthy Mother    Healthy Brother    Healthy Brother    Social History   Occupational History   Occupation: Retired    Comment: Environmental manager for [employer]  Tobacco Use   Smoking status: Former    Types: Cigars    Passive exposure: Never   Smokeless tobacco: Never  Vaping Use   Vaping status: Never Used  Substance and Sexual Activity   Alcohol use: Yes    Comment: rare   Drug use: No   Sexual activity: Yes    Birth control/protection: None   Tobacco Counseling Counseling given: Yes  SDOH Screenings   Food Insecurity: No Food Insecurity (08/28/2024)  Housing: Low Risk  (08/28/2024)  Transportation Needs: No Transportation Needs (08/28/2024)  Utilities:  Not At Risk (08/28/2024)  Alcohol Screen: Low Risk  (05/25/2022)  Depression (PHQ2-9): Low Risk  (08/28/2024)  Financial Resource Strain: Low Risk  (05/25/2022)  Physical Activity: Sufficiently Active (08/28/2024)  Social Connections: Moderately Isolated (08/28/2024)  Stress: No Stress Concern Present (08/28/2024)  Tobacco Use: Medium Risk (08/28/2024)  Health Literacy: Adequate Health Literacy (08/28/2024)   Depression Screen    08/28/2024    1:36 PM 03/21/2024   10:49 AM 03/21/2024    8:40 AM 01/20/2024   11:09 AM 12/21/2023    8:22 AM 12/17/2023    1:45 PM 05/25/2022    5:39 PM  PHQ 2/9 Scores  PHQ - 2 Score 0 1 2 0 2 3 0  PHQ- 9 Score 0 6 8 2 9 9       Goals Addressed               This Visit's Progress     I want to lose weight (pt-stated)         Visit info / Clinical Intake: Medicare Wellness Visit Type:: Subsequent Annual Wellness Visit Medicare Wellness Visit Mode:: In-person (required for WTM) Interpreter Needed?: No Pre-visit prep was completed: yes AWV questionnaire completed by patient prior to visit?: no Living arrangements:: lives with spouse/significant other Patient's Overall Health Status Rating: good Typical amount of pain: some Does pain affect daily life?: (!) yes Are you currently prescribed opioids?: (!) yes (tramadol )  Dietary Habits and Nutritional Risks How many meals a day?: 2 Eats fruit and vegetables daily?: yes Most meals are obtained by: having others provide food (wife prepares meals) In the last 2 weeks, have you had any of the following?: -- (n/a) Diabetic:: no  Functional Status Activities of Daily Living (to include ambulation/medication): Independent Ambulation: Independent Medication Administration: Independent Home Management: Independent Manage your own finances?: yes Primary transportation is: driving Concerns about vision?: no *vision screening is required for WTM* Concerns about hearing?: (!) yes Uses hearing aids?: no Hear  whispered voice?: yes  Fall Screening Falls in the past year?: 0 Number of falls in past year: 0 Was there an injury with Fall?: 0 Fall Risk Category Calculator: 0 Patient Fall Risk Level: Low Fall Risk  Fall Risk Patient at Risk for Falls Due to: No Fall Risks Fall risk Follow up: Falls evaluation completed; Education provided; Falls prevention discussed  Home and Transportation Safety: All rugs have non-skid backing?: N/A, no rugs All stairs or steps have railings?: (!) no Grab bars in the bathtub or shower?: yes Have non-skid surface in bathtub or shower?: (!) no Good home lighting?: yes Regular seat belt use?: yes Hospital stays in the last year:: (!) yes How many hospital stays:: 1 Reason: NSTEMI  Cognitive Assessment Difficulty concentrating, remembering, or making decisions? : no Will 6CIT or Mini Cog be Completed: no 6CIT or Mini Cog Declined: patient alert, oriented, able to answer questions appropriately and recall recent events  Advance Directives (For  Healthcare) Does Patient Have a Medical Advance Directive?: Yes Type of Advance Directive: Healthcare Power of [place]; Living will Copy of Healthcare Power of Attorney in Chart?: No - copy requested Copy of Living Will in Chart?: No - copy requested Would patient like information on creating a medical advance directive?: No - Patient declined  Reviewed/Updated  Reviewed/Updated: All; Medical History; Surgical History; Family History; Medications; Allergies; Care Teams; Patient Goals        Objective:    Today's Vitals   08/28/24 1334  BP: 116/60  Pulse: 86  Resp: 16  SpO2: 97%  Weight: 209 lb (94.8 kg)  Height: 5' 8 (1.727 m)  PainSc: 4   PainLoc: Knee   Body mass index is 31.78 kg/m.  Current Medications (verified) Outpatient Encounter Medications as of 08/28/2024  Medication Sig   aspirin  EC 81 MG tablet Take 1 tablet (81 mg total) by mouth daily. Swallow whole.   DULoxetine  (CYMBALTA ) 30 MG  capsule TAKE TWO CAPSULES BY MOUTH EVERY DAY   ferrous sulfate 325 (65 FE) MG tablet Take 1 tablet (325 mg total) by mouth 2 (two) times daily with a meal. Do not take with dairy products   metoprolol  succinate (TOPROL -XL) 25 MG 24 hr tablet Take 0.5 tablets (12.5 mg total) by mouth daily.   nitroGLYCERIN  (NITROSTAT ) 0.4 MG SL tablet Place 1 tablet (0.4 mg total) under the tongue every 5 (five) minutes as needed for chest pain.   prasugrel  (EFFIENT ) 10 MG TABS tablet Take 1 tablet (10 mg total) by mouth daily.   pregabalin (LYRICA) 150 MG capsule Take 150 mg by mouth 2 (two) times daily.   REPATHA  SURECLICK 140 MG/ML SOAJ Inject 140 mg into the skin every 14 (fourteen) days.   traMADol  (ULTRAM ) 50 MG tablet Take 1 tablet (50 mg total) by mouth daily as needed for severe pain (pain score 7-10).   zolpidem  (AMBIEN ) 10 MG tablet Take 1 tablet (10 mg total) by mouth at bedtime.   No facility-administered encounter medications on file as of 08/28/2024.   Hearing/Vision screen Hearing Screening - Comments:: Patient states he has some hearing loss but declines referral for testing  Vision Screening - Comments:: Wears rx glasses - up to date with routine eye exams with  Medford Gaudy, MD @ [facility] Immunizations and Health Maintenance Health Maintenance  Topic Date Due   COVID-19 Vaccine (1) Never done   Hepatitis C Screening  Never done   Influenza Vaccine  01/23/2025 (Originally 05/26/2024)   Medicare Annual Wellness (AWV)  08/28/2025   Colonoscopy  05/15/2027   DTaP/Tdap/Td (2 - Td or Tdap) 05/02/2031   Pneumococcal Vaccine: 50+ Years  Completed   Zoster Vaccines- Shingrix  Completed   Meningococcal B Vaccine  Aged Out        Assessment/Plan:  This is a routine wellness examination for [place].  Patient Care Team: Bevely Doffing, FNP as PCP - General (Family Medicine) Ernie Cough, MD as Consulting Physician (Orthopedic Surgery) Anner Alm ORN, MD as Consulting Physician  (Cardiology) Gaudy Bruckner, MD as Consulting Physician (Ophthalmology) Leopoldo Camellia DASEN., MD as Referring Physician (Neurology) Alyne Mancel Coleridge, MD as Referring Physician (Pathology) Delsie Riggs, NP as Nurse Practitioner (Nephrology) Rachele Gaynell RAMAN, MD as Referring Physician (Nephrology) Neda Jennet LABOR, MD as Consulting Physician (Pulmonary Disease) Dietrich Alyce BROCKS, MD as Referring Physician (Dermatology) Colon Shove, MD as Consulting Physician (Neurosurgery)  I have personally reviewed and noted the following in the patient's chart:   Medical and  social history Use of alcohol, tobacco or illicit drugs  Current medications and supplements including opioid prescriptions. Functional ability and status Nutritional status Physical activity Advanced directives List of other physicians Hospitalizations, surgeries, and ER visits in previous 12 months Vitals Screenings to include cognitive, depression, and falls Referrals and appointments  No orders of the defined types were placed in this encounter.  In addition, I have reviewed and discussed with patient certain preventive protocols, quality metrics, and best practice recommendations. A written personalized care plan for preventive services as well as general preventive health recommendations were provided to patient.   Mikiala Fugett, CMA   08/28/2024   No follow-ups on file.  After Visit Summary: (In Person-Declined) Patient declined AVS at this time.  Nurse Notes: n/a

## 2024-08-28 NOTE — Patient Instructions (Signed)
 [name],  Thank you for taking the time for your Medicare Wellness Visit. I appreciate your continued commitment to your health goals. Please review the care plan we discussed, and feel free to reach out if I can assist you further.  Medicare recommends these wellness visits once per year to help you and your care team stay ahead of potential health issues. These visits are designed to focus on prevention, allowing your provider to concentrate on managing your acute and chronic conditions during your regular appointments.  Please note that Annual Wellness Visits do not include a physical exam. Some assessments may be limited, especially if the visit was conducted virtually. If needed, we may recommend a separate in-person follow-up with your provider.  Wishing you excellent health and many blessings in the year to come!  -Zian Delair, CMA  Ongoing Care Seeing your primary care provider every 3 to 6 months helps us  monitor your health and provide consistent, personalized care.   Recommended Screenings:  Health Maintenance  Topic Date Due   COVID-19 Vaccine (1) Never done   Hepatitis C Screening  Never done   Flu Shot  01/23/2025*   Medicare Annual Wellness Visit  08/28/2025   Colon Cancer Screening  05/15/2027   DTaP/Tdap/Td vaccine (2 - Td or Tdap) 05/02/2031   Pneumococcal Vaccine for age over 63  Completed   Zoster (Shingles) Vaccine  Completed   Meningitis B Vaccine  Aged Out  *Topic was postponed. The date shown is not the original due date.       08/28/2024    1:20 PM  Advanced Directives  Does Patient Have a Medical Advance Directive? Yes  Type of Estate Agent of [place];Living will  Copy of Healthcare Power of Attorney in Chart? No - copy requested   Advance Care Planning is important because it: Ensures you receive medical care that aligns with your values, goals, and preferences. Provides guidance to your family and loved ones, reducing the emotional  burden of decision-making during critical moments.  Vision: Annual vision screenings are recommended for early detection of glaucoma, cataracts, and diabetic retinopathy. These exams can also reveal signs of chronic conditions such as diabetes and high blood pressure.  Dental: Annual dental screenings help detect early signs of oral cancer, gum disease, and other conditions linked to overall health, including heart disease and diabetes.  Please see the attached documents for additional preventive care recommendations.

## 2024-08-28 NOTE — Telephone Encounter (Signed)
 The patient was notified that the recommendation from [facility] [place]  is to continue with aspirin . Effient  can be held 7 days prior to his procedure and then reinstated per surgeons recommendations. 12/05/2024 he is supposed to be on Plavix monotherapy. Please have him call us  before this point to ensure that he has this for this transition. The patient wanted to know was he cleared for surgery and he was told yes that [facility] [place] said he has low cardiac risk.

## 2024-08-28 NOTE — Patient Instructions (Signed)
 Medication Instructions:  Will call with the Asprin therapy instructions.  *If you need a refill on your cardiac medications before your next appointment, please call your pharmacy*  Lab Work: None  If you have labs (blood work) drawn today and your tests are completely normal, you will receive your results only by: MyChart Message (if you have MyChart) OR A paper copy in the mail If you have any lab test that is abnormal or we need to change your treatment, we will call you to review the results.  Testing/Procedures: Echocardiogram for February 2026  Follow-Up: At [facility], you and your health needs are our priority.  As part of our continuing mission to provide you with exceptional heart care, our providers are all part of one team.  This team includes your primary Cardiologist (physician) and Advanced Practice Providers or APPs (Physician Assistants and Nurse Practitioners) who all work together to provide you with the care you need, when you need it.  Your next appointment:   March 2026  Provider:   Alm Clay, MD    We recommend signing up for the patient portal called MyChart.  Sign up information is provided on this After Visit Summary.  MyChart is used to connect with patients for Virtual Visits (Telemedicine).  Patients are able to view lab/test results, encounter notes, upcoming appointments, etc.  Non-urgent messages can be sent to your provider as well.   To learn more about what you can do with MyChart, go to [email or web address].   Other Instructions With call with the Asprin therapy instructions.

## 2024-08-28 NOTE — Progress Notes (Signed)
 Cardiology Office Note:  .   Date:  08/28/2024  ID:  Marinda MARLA Maes, DOB [birth year], MRN [id number] PCP: Bevely Doffing, FNP  [facility] Providers Cardiologist:  Alm Clay, MD {  History of Present Illness: .   IVO MOGA is a 73 y.o. male with history of CAD status post DES to D1 11/2023, proximal-mid LAD, hypertension, hyperlipidemia statin intolerant, CKD, OSA on inspire, RLS     CAD NSTEMI 11/2023, EF normal.  Received stents to proximal/mid LAD and D1, 60% apical LAD treated medically. Flu+.  Social history  Woodworker Non-smoker     Patient with history of NSTEMI with 2 stents placed to proximal-mid LAD.  Last seen in clinic 03/2024 and doing well with only minor complaints of bruising, he had no other complaints at that time.  Today patient presents for preoperative evaluation for knee procedure.  He has no acute complaints and has done very well since his stent placement.  His wife is encouraging him to be more active and continue to lose weight which he is working on.  Functionally he only inhibited slightly by his knee pain but otherwise he is very active, continues to walk dog daily.  Him and his wife were in [place] earlier this year and were walking around without any exertional symptoms.  He is very happy with his Repatha .  He is at complete resolution of RLS after starting pregabalin.  ROS: Denies: Chest pain, shortness of breath, orthopnea, peripheral edema, palpitations, decreased exercise intolerance, fatigue, lightheadedness.   Studies Reviewed: SABRA    EKG Interpretation Date/Time:  Monday August 28 2024 09:10:31 EST Ventricular Rate:  67 PR Interval:  116 QRS Duration:  84 QT Interval:  372 QTC Calculation: 393 R Axis:   -23  Text Interpretation: Normal sinus rhythm Nonspecific T wave abnormality When compared with ECG of 14-Dec-2023 08:16, No significant change was found Confirmed by Darryle Currier [phone]) on 08/28/2024 9:14:36 AM    Risk  Assessment/Calculations:             Physical Exam:   VS:  BP 114/72   Pulse 67   Ht 5' 8 (1.727 m)   Wt 209 lb 3.2 oz (94.9 kg)   SpO2 98%   BMI 31.81 kg/m    Wt Readings from Last 3 Encounters:  08/28/24 209 lb 3.2 oz (94.9 kg)  08/04/24 207 lb 6.4 oz (94.1 kg)  05/10/24 203 lb (92.1 kg)    GEN: Well nourished, well developed in no acute distress NECK: No JVD; No carotid bruits CARDIAC: RRR, no murmurs, rubs, gallops RESPIRATORY:  Clear to auscultation without rales, wheezing or rhonchi  ABDOMEN: Soft, non-tender, non-distended EXTREMITIES:  No edema; No deformity   ASSESSMENT AND PLAN: .    Preop evaluation Overall stable from a cardiac perspective and with good exercise capacity able to complete greater than 4 METS.  RCRI is 1.1%.  At this time he is at acceptable risk for upcoming procedure without any further testing. Procedures planned for 11/21/2024.  Ideally he would have completed uninterrupted 1 year of DAPT therapy but since this has already been scheduled and close to his 12 months, would continue on aspirin .  His Effient  can be held 7 days prior.  Reinstate when safe to do so.  Case discussed with Dr. Clay. Will route note to surgeon.  Procedure:  Right partial knee arthroplasty  Date of Surgery:  Clearance 11/21/24  Surgeon: Dr. Donnice Car, MD Surgeon's Group or Practice Name: [facility] Phone number: [phone] Fax number: [phone]  CAD HLD -11/2023 NSTEMI.  Stents placed to proximal/mid LAD, 60% and D1. Apical LAD treated medically. EF normal.  Stable disease and has done well since his stenting.  No anginal complaints or equivalents. Continue DAPT x 1 year with ASA/Effient , after 1 year will convert to Plavix monotherapy to complete a second year.  Convert to Plavix after 12/05/2024. He will call and let us  know when he comes to this point.  Continue with Repatha , Toprol  XL 12.5 mg. LDL is at target goal less than  55.  LDL 45 01/2024.  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  Repeat in 11/2024.   Hypertension Well-controlled.  On beta-blocker.     OSA on inspire (compliant)   CKD Has single functioning kidney. Follows with nephrology    Dispo: Follow-up with Dr. Anner 12/2024.  Patient left after case discussed with Dr. Anner.  We will call him and forward message to surgeon with the above plans.  Signed, Thom LITTIE Sluder, PA-C

## 2024-09-14 ENCOUNTER — Other Ambulatory Visit: Payer: Self-pay

## 2024-09-14 DIAGNOSIS — Z789 Other specified health status: Secondary | ICD-10-CM

## 2024-09-14 DIAGNOSIS — I214 Non-ST elevation (NSTEMI) myocardial infarction: Secondary | ICD-10-CM

## 2024-09-14 DIAGNOSIS — I2510 Atherosclerotic heart disease of native coronary artery without angina pectoris: Secondary | ICD-10-CM

## 2024-09-14 DIAGNOSIS — E782 Mixed hyperlipidemia: Secondary | ICD-10-CM

## 2024-09-14 MED ORDER — REPATHA SURECLICK 140 MG/ML ~~LOC~~ SOAJ: 140.0000 mg | SUBCUTANEOUS | 2 refills | Status: DC

## 2024-09-15 ENCOUNTER — Encounter: Payer: Self-pay | Admitting: Nurse Practitioner

## 2024-09-15 ENCOUNTER — Other Ambulatory Visit: Payer: Self-pay

## 2024-09-15 ENCOUNTER — Ambulatory Visit (INDEPENDENT_AMBULATORY_CARE_PROVIDER_SITE_OTHER): Admitting: Nurse Practitioner

## 2024-09-15 VITALS — BP 118/76 | HR 82 | Ht 68.0 in | Wt 206.8 lb

## 2024-09-15 DIAGNOSIS — G2581 Restless legs syndrome: Secondary | ICD-10-CM | POA: Diagnosis not present

## 2024-09-15 DIAGNOSIS — G4733 Obstructive sleep apnea (adult) (pediatric): Secondary | ICD-10-CM

## 2024-09-15 DIAGNOSIS — Z9682 Presence of neurostimulator: Secondary | ICD-10-CM

## 2024-09-15 DIAGNOSIS — Z4542 Encounter for adjustment and management of neuropacemaker (brain) (peripheral nerve) (spinal cord): Secondary | ICD-10-CM | POA: Diagnosis not present

## 2024-09-15 DIAGNOSIS — G4700 Insomnia, unspecified: Secondary | ICD-10-CM

## 2024-09-15 MED ORDER — ZOLPIDEM TARTRATE 10 MG PO TABS: 10.0000 mg | ORAL_TABLET | Freq: Every day | ORAL | 0 refills | Status: DC

## 2024-09-15 NOTE — Assessment & Plan Note (Signed)
 Significant improvement with change to Lyrica. Continue iron supplementation. Follow up with Dr. Leopoldo as scheduled. Can consider addition of Nidra device if symptoms return and are poorly controlled with pharmacological therapy. Encouraged to exercise regularly.

## 2024-09-15 NOTE — Progress Notes (Signed)
 @Patient  ID: [name], male    DOB: 03/24/1951, 73 y.o.   MRN: [id number]  Chief Complaint  Patient presents with   INSPIRE FOLLOW UP    Referring provider: No ref. provider found  HPI: 73 year old male, former smoker followed for severe OSA treated with Inspire and RLS. He is a patient of Dr. Cathye and last seen in office 08/04/2024 by [facility] NP. Past medical history significant for NSTEMI, CAD, HTN, CKD, IDA.  TEST/EVENTS:  04/06/2022 split night study: AHI 39.5/h, SpO2 low 85% 07/21/2022: DISE with Dr. Carlie; anterior posterior collapse  09/09/2022: Inspire implantation with Dr. Carlie  11/26/2022: Inspire activation  03/10/2023 inspire titration: persistent severe OSA despite Inspire use; increased in supine >> changed to electrode B setting 05/24/2023 HST: AHI 53/h on Inspire electrode B >> changed to electrode C setting 07/28/2023 HST: AHI 15.2/h on Inspire electrode C, 0.8 V 07/05/2024 HST: AHI 27.6/h, SpO2 low 80% on Inspire Electrode C, 1.1 V  03/14/2024: Ov with Dr. Neda. Currently on voltage 0.8; adjusted to 0.9 V. wakes up in the middle of the night and feels the machine is not working well. More sleepiness. Struggles with RLS despite neurontin , mirapex  and cymbalta . Tried requip in past. On C mode, pause 20 min, delay 30 min, 8 hr duration. Still snoring when he's on his back. Encouraged to increase voltage as tolerated. Optimize treatment for RLS. Increase neurontin  to 600 mg at bedtime and 300 in afternoon. Can increase to 900 at night if needed. Will schedule WatchPAT once he is more stable.   05/10/2024: SHERLEAN with Merrianne Mccumbers NP [name] Burnard is a 73 year old male with sleep apnea treated with Inspire and restless leg syndrome who presents for follow-up on his sleep apnea management and restless leg symptoms. Since his last visit, he has increased the voltage on his Inspire device to level 2, which has improved his sleep quality and energy levels during the day. He  reports that he is comfortable with the current settings and has not tried higher levels yet. Snoring has decreased. No drowsy driving.  He has a history of restless leg syndrome, which he describes as 'going atomic' and significantly impacting his life, preventing him from planning overnight trips or visiting others. He refers to episodes as 'the storm' and reports that they have been occurring frequently over the past several months. He has tried various treatments in the past, including Requip and another unspecified medication, without success. He currently takes gabapentin  and pramipexole , which initially provided relief but have become less effective over time. He has increased his gabapentin  dose to 600 mg at bedtime and takes 300 mg in the afternoon. He also uses  Cymbalta . Waiting on a specialist appt at [facility] but didn't have openings until February. No issues with drowsiness related to medication use or aberrant behavior.  He has also tried smoking marijuana, which he finds provides some relief during episodes, although he is hesitant about its use 04/10/2024-05/09/2024 Inspire Download 0.9-1.2 V, Electrode C, 90/33  Delay 30 min, pause 20 min, duration 8 hr 29/30 days; 73% >4 hr Avg use 5 hr 15 min Avg pause 0.2 Current setting: level 2 (1 V)  08/04/2024:Ov with Trimaine Maser NP Patient presents today for follow up. He had a repeat home sleep study recently that revealed worsening control on 1.1 V on Inspire, indicating possible overstimulation at this level. He has also had medication changes and sleep changes since our last visit. He saw a  RLS specialist, who took him off mirapex  and gabapentin  and switched him to pregabalin nightly. He is still taking Cymbalta . He tells me this is working tremendously better for him. He's actually not had any issues with his RLS since making this change. His sleep is more restful and he's been able to sleep for longer stretches, extending from 4-5 hours to typically  about 6-6.5 hr. He does not have any issues with his current [place] settings. He did briefly try to increase to 1.2V, which felt too strong so he has stayed at 1.1V. He feels his energy levels are better during the day; although still has some fatigue. His wife has not commented on his snoring, seems to be reduced. No drowsy driving. No mood changes with medication change.  He did have low iron levels on prior check. Is not currently on iron supplementation.  07/04/2024-08/03/2024: Inspire Download Electrode C, 90/33, 0.9-1.2 V Current level 3 1.1 V 29/30 days; 87% >4 hr; average use 6 hr 25 min Avg pause 0.2 Ginny with Inspire present  09/15/2024: Today - follow up Patient presents today for follow up. Doing very well since our last visit. RLS symptoms have been well managed. He's getting about 6-6.5 hr of sleep a night, which is an improvement for him. He's feeling better rested and energy levels are improved during the day. He denies any drowsy driving. Inspire settings are comfortable. Using it nightly. No issues or complaints today.       Allergies  Allergen Reactions   Statins Other (See Comments)    Muscle pain   Nsaids Other (See Comments)    Cannot take because of my kidney disease.    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   Sleep apnea    uses Cpap nightly     Tobacco History: Social History   Tobacco Use  Smoking Status Former   Types: Cigars   Passive exposure: Never  Smokeless Tobacco Never   Counseling given: Not Answered   Outpatient Medications Prior to Visit  Medication Sig Dispense Refill   aspirin  EC 81 MG tablet Take 1 tablet (81 mg total) by mouth daily. Swallow whole.     DULoxetine  (CYMBALTA ) 30 MG capsule TAKE TWO CAPSULES BY MOUTH EVERY DAY 60 capsule 2   Evolocumab  (REPATHA  SURECLICK) 140 MG/ML SOAJ Inject 140 mg into the skin every 14 (fourteen) days. 2 mL 2   ferrous sulfate  325 (65 FE) MG tablet Take 1 tablet (325 mg total) by mouth 2 (two) times daily with a meal. Do not take with dairy products 60 tablet 5   metoprolol  succinate (TOPROL -XL) 25 MG 24 hr tablet Take 0.5 tablets (12.5 mg total) by mouth daily. 45 tablet 1   nitroGLYCERIN  (NITROSTAT ) 0.4 MG SL tablet Place 1 tablet (0.4 mg total) under the tongue every 5 (five) minutes as needed for chest pain. 25 tablet 11  prasugrel  (EFFIENT ) 10 MG TABS tablet Take 1 tablet (10 mg total) by mouth daily. 180 tablet 1   pregabalin (LYRICA) 150 MG capsule Take 150 mg by mouth 2 (two) times daily.     traMADol  (ULTRAM ) 50 MG tablet Take 1 tablet (50 mg total) by mouth daily as needed for severe pain (pain score 7-10). 30 tablet 0   zolpidem  (AMBIEN ) 10 MG tablet Take 1 tablet (10 mg total) by mouth at bedtime. 30 tablet 0   No facility-administered medications prior to visit.     Review of Systems: as above    Physical Exam:  BP 118/76   Pulse 82   Ht 5' 8 (1.727 m)   Wt 206 lb 12.8 oz (93.8 kg)   SpO2 97% Comment: on RA  BMI 31.44 kg/m   GEN: Pleasant, interactive, well-appearing; obese; in no acute distress HEENT:  Normocephalic and atraumatic. PERRLA. Sclera white. Nasal turbinates pink, moist and patent bilaterally. No rhinorrhea present. Oropharynx pink and moist, without exudate or edema. No lesions, ulcerations, or postnasal drip.  NECK:  Supple w/  fair ROM. Thyroid  symmetrical with no goiter or nodules palpated. No lymphadenopathy.   CV: RRR, no m/r/g, no peripheral edema. Pulses intact, +2 bilaterally. No cyanosis, pallor or clubbing. PULMONARY:  Unlabored, regular breathing. Clear bilaterally A&P w/o wheezes/rales/rhonchi. No accessory muscle use.  GI: BS present and normoactive. Soft, non-tender to palpation.  MSK: No erythema, warmth or tenderness. Cap refil <2 sec all extrem.  Neuro: A/Ox3. No focal deficits noted.   Skin: Warm, no lesions or rashe Psych: Normal affect and behavior. Judgement and thought content appropriate.     Lab Results:  CBC    Component Value Date/Time   WBC 5.8 12/07/2023 0401   RBC 3.66 (L) 12/07/2023 0401   HGB 11.1 (L) 12/07/2023 0401   HCT 32.9 (L) 12/07/2023 0401   PLT 140 (L) 12/07/2023 0401   MCV 89.9 12/07/2023 0401   MCH 30.3 12/07/2023 0401   MCHC 33.7 12/07/2023 0401   RDW 12.9 12/07/2023 0401   LYMPHSABS 2.4 05/01/2021 1635   MONOABS 0.9 05/01/2021 1635   EOSABS 0.2 05/01/2021 1635   BASOSABS 0.1 05/01/2021 1635    BMET    Component Value Date/Time   NA 138 12/07/2023 0402   NA 145 (H) 11/27/2019 1002   K 4.0 12/07/2023 0402   CL 105 12/07/2023 0402   CO2 23 12/07/2023 0402   GLUCOSE 103 (H) 12/07/2023 0402   BUN 17 12/07/2023 0402   BUN 16 11/27/2019 1002   CREATININE 1.45 (H) 12/07/2023 0402   CALCIUM  8.7 (L) 12/07/2023 0402   GFRNONAA 51 (L) 12/07/2023 0402   GFRAA 53 (L) 11/27/2019 1002    BNP No results found for: BNP   Imaging:  No results found.  Administration History     None           No data to display          No results found for: NITRICOXIDE      Assessment & Plan:   OSA (obstructive sleep apnea) Severe OSA, intolerant of CPAP. Treated with Inspire. Complex hx following implantation with persistent events on Electrode A and Electrode B. Decreased residual events on Electrode C on repeat HST; mildly moderate OSA. Further  adjustments to voltage were made since then due to lack of perceived benefit but unfortunately, repeat study showed residual AHI of 26/h, indicating possibility of overstimulation. He also has struggled with other comorbid conditions that  disrupt sleep including significant RLS and insomnia. His RLS has since been better managed, resulting in improved sleep quality. He was adjusted to 0.9V at his last OV and seems to be tolerating well. Receiving benefit from use. Tongue movement assessed with good protrusion. Advised to remain at this level. Will obtain repeat HST for evaluation of OSA control. If he remains poorly controlled, recommend awake endoscopy for further adjustments. Safe driving practices reviewed. Aware of risks of untreated OSA.  Waveform and usage reviewed. He is utilizing nightly with an increase of over an hour on average since RLS has been better managed.   Patient Instructions  Continue pregabalin nightly  Continue duloxetine  two capsules daily    Take iron supplement Twice daily with food. Do not take with dairy products    Continue Inspire nightly. Stay on the level you are on, which is level 3. Call me if you have any issues with discomfort    Follow up with RLS specialist   Home sleep study ordered - someone will contact you to schedule this    Follow up in 8 weeks with Dr. Neda after home sleep study to review. If symptoms do not improve or worsen, please contact office for sooner follow up or seek emergency care.    Restless leg syndrome Significant improvement with change to Lyrica. Continue iron supplementation. Follow up with Dr. Leopoldo as scheduled. Can consider addition of Nidra device if symptoms return and are poorly controlled with pharmacological therapy. Encouraged to exercise regularly.     Advised if symptoms do not improve or worsen, to please contact office for sooner follow up or seek emergency care.   I spent 35 minutes of dedicated to the care of  this patient on the date of this encounter to include pre-visit review of records, face-to-face time with the patient discussing conditions above, post visit ordering of testing, clinical documentation with the electronic health record, making appropriate referrals as documented, and communicating necessary findings to members of the patients care team.  Comer LULLA Rouleau, NP 09/15/2024  Pt aware and understands NP's role.

## 2024-09-15 NOTE — Assessment & Plan Note (Signed)
 Severe OSA, intolerant of CPAP. Treated with Inspire. Complex hx following implantation with persistent events on Electrode A and Electrode B. Decreased residual events on Electrode C on repeat HST; mildly moderate OSA. Further adjustments to voltage were made since then due to lack of perceived benefit but unfortunately, repeat study showed residual AHI of 26/h, indicating possibility of overstimulation. He also has struggled with other comorbid conditions that disrupt sleep including significant RLS and insomnia. His RLS has since been better managed, resulting in improved sleep quality. He was adjusted to 0.9V at his last OV and seems to be tolerating well. Receiving benefit from use. Tongue movement assessed with good protrusion. Advised to remain at this level. Will obtain repeat HST for evaluation of OSA control. If he remains poorly controlled, recommend awake endoscopy for further adjustments. Safe driving practices reviewed. Aware of risks of untreated OSA.  Waveform and usage reviewed. He is utilizing nightly with an increase of over an hour on average since RLS has been better managed.   Patient Instructions  Continue pregabalin nightly  Continue duloxetine  two capsules daily    Take iron supplement Twice daily with food. Do not take with dairy products    Continue Inspire nightly. Stay on the level you are on, which is level 3. Call me if you have any issues with discomfort    Follow up with RLS specialist   Home sleep study ordered - someone will contact you to schedule this    Follow up in 8 weeks with Dr. Neda after home sleep study to review. If symptoms do not improve or worsen, please contact office for sooner follow up or seek emergency care.

## 2024-09-15 NOTE — Telephone Encounter (Signed)
 Copied from CRM [id number]. Topic: Clinical - Medication Refill >> Sep 15, 2024  1:04 PM Tiffini S wrote: Medication: zolpidem  (AMBIEN ) 10 MG tablet  Has the patient contacted their pharmacy? Yes (Agent: If no, request that the patient contact the pharmacy for the refill. If patient does not wish to contact the pharmacy document the reason why and proceed with request.) (Agent: If yes, when and what did the pharmacy advise?)  This is the patient's preferred pharmacy:  [facility], [place] - [id number] [place] [address] Phone: [phone] Fax: [phone]    Is this the correct pharmacy for this prescription? Yes If no, delete pharmacy and type the correct one.   Has the prescription been filled recently? Yes  Is the patient out of the medication? Yes  Has the patient been seen for an appointment in the last year OR does the patient have an upcoming appointment? Yes  Can we respond through MyChart? Yes  Agent: Please be advised that Rx refills may take up to 3 business days. We ask that you follow-up with your pharmacy.

## 2024-09-15 NOTE — Patient Instructions (Addendum)
 Continue pregabalin nightly  Continue duloxetine  two capsules daily    Take iron supplement Twice daily with food. Do not take with dairy products    Continue Inspire nightly. Stay on the level you are on, which is level 3. Call me if you have any issues with discomfort    Follow up with RLS specialist   Home sleep study ordered - someone will contact you to schedule this    Follow up in 8 weeks with Dr. Neda after home sleep study to review. If symptoms do not improve or worsen, please contact office for sooner follow up or seek emergency care.

## 2024-09-17 ENCOUNTER — Other Ambulatory Visit: Payer: Self-pay

## 2024-09-17 DIAGNOSIS — Z789 Other specified health status: Secondary | ICD-10-CM

## 2024-09-17 DIAGNOSIS — E782 Mixed hyperlipidemia: Secondary | ICD-10-CM

## 2024-09-17 DIAGNOSIS — I2510 Atherosclerotic heart disease of native coronary artery without angina pectoris: Secondary | ICD-10-CM

## 2024-09-17 DIAGNOSIS — I214 Non-ST elevation (NSTEMI) myocardial infarction: Secondary | ICD-10-CM

## 2024-09-17 MED ORDER — REPATHA SURECLICK 140 MG/ML ~~LOC~~ SOAJ: 140.0000 mg | SUBCUTANEOUS | 2 refills | Status: AC

## 2024-09-25 ENCOUNTER — Ambulatory Visit

## 2024-09-25 VITALS — BP 130/88 | HR 84 | Ht 68.0 in | Wt 206.0 lb

## 2024-09-25 DIAGNOSIS — G2581 Restless legs syndrome: Secondary | ICD-10-CM

## 2024-09-25 DIAGNOSIS — Z01818 Encounter for other preprocedural examination: Secondary | ICD-10-CM

## 2024-09-25 DIAGNOSIS — G4700 Insomnia, unspecified: Secondary | ICD-10-CM

## 2024-09-25 DIAGNOSIS — M1711 Unilateral primary osteoarthritis, right knee: Secondary | ICD-10-CM | POA: Insufficient documentation

## 2024-09-25 DIAGNOSIS — M7918 Myalgia, other site: Secondary | ICD-10-CM

## 2024-09-25 DIAGNOSIS — G8929 Other chronic pain: Secondary | ICD-10-CM | POA: Diagnosis not present

## 2024-09-25 MED ORDER — ZOLPIDEM TARTRATE 10 MG PO TABS: 10.0000 mg | ORAL_TABLET | Freq: Every day | ORAL | 0 refills | Status: DC

## 2024-09-25 NOTE — Telephone Encounter (Signed)
 Paperwork given to provider when pt was roomed at appointment 09/25/2024

## 2024-09-25 NOTE — Assessment & Plan Note (Signed)
 Chronic pain, multiple sites Chronic pain in knees, back, neck, feet, and hands. Managed with tramadol  as needed. - Continue tramadol  as needed for pain management.

## 2024-09-25 NOTE — Progress Notes (Signed)
 Established Patient Office Visit  Subjective   Patient ID: [name], male    DOB: 05-09-1951  Age: 73 y.o. MRN: [id number]  Chief Complaint  Patient presents with   Medical Management of Chronic Issues    Follow up/ Medical [facility]    HPI Discussed the use of AI scribe software for clinical note transcription with the patient, who gave verbal consent to proceed.  History of Present Illness    [name] is a 73 year old male who presents for a six-month follow-up and preoperative clearance for right knee surgery.  Preoperative evaluation and musculoskeletal pain - Scheduled for right knee surgery - History of successful partial left knee replacement - Chronic pain in knees, back, neck, feet, and hands - Pain managed with tramadol  as needed; prescription filled twice since October 2024 - Remains active, standing and moving for five to six hours daily despite aches and pains  Restless leg syndrome - History of severe restless leg syndrome causing nocturnal discomfort and need to ambulate at night - Referred to neurology; transitioned to pregabalin (Lyrica) with complete resolution of symptoms after three doses - No symptoms of restless legs since starting pregabalin - Previously trialed gabapentin  1200 mg daily and pramipexole  without benefit - Experiences dry mouth as a side effect of pregabalin  Sleep disturbance and obstructive sleep apnea - Chronic insomnia managed with Ambien  for 20 years - History of obstructive sleep apnea; previously used CPAP, now managed with Inspire implant - Inspire implant now functioning optimally - Achieves five to six hours of sleep nightly and is satisfied with current sleep quality  Cardiac history - Myocardial infarction on December 02, 2022, concurrent with influenza infection - Treated with cardiac catheterization - No residual cardiac symptoms - Currently taking Effient  and aspirin       Patient Active Problem  List   Diagnosis Date Noted   Primary osteoarthritis of right knee 09/25/2024   Coronary artery disease involving native coronary artery without angina pectoris 04/26/2024   Statin myopathy 04/26/2024   Need for pneumococcal 20-valent conjugate vaccination 03/21/2024   Presence of drug coated stent in LAD coronary artery 12/18/2023   Chronic musculoskeletal pain 12/18/2023   Aortic root dilation 12/18/2023   Hyperlipidemia with target low density lipoprotein (LDL) cholesterol less than 55 mg/dL 97/77/7974   CKD (chronic kidney disease), stage III (HCC) 12/06/2023   NSTEMI (non-ST elevated myocardial infarction) (HCC) 12/04/2023   LFT elevation 12/04/2023   Moderate recurrent major depression (HCC) 07/29/2023   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, sequela 11/11/2021   S/P left unicompartmental knee replacement 09/16/2021   Hypertensive renal disease 09/09/2021   DOE (dyspnea on exertion) 10/11/2019   Abnormal heart sounds 10/11/2019   Iron deficiency anemia 09/07/2019   Metabolic bone disease 04/25/2019   Renal stone 04/25/2019   Presence of left artificial knee joint 04/11/2019   Lumbar radiculopathy 12/07/2018   Insomnia 09/14/2018   S/P right knee arthroscopy 04/14/18    Absolute anemia 04/05/2017   Restless leg syndrome 12/07/2016   Numbness 12/07/2016   OSA (obstructive sleep apnea) 12/07/2016   Spinal stenosis of cervical region 12/07/2016   Spinal stenosis of lumbar region 06/18/2014   Medial meniscus, posterior horn derangement 12/20/2012   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   Stage 3 chronic kidney disease (HCC) 11/22/2011   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   Essential (primary) hypertension 11/22/2011      ROS    Objective:     BP 130/88 (  BP Location: Left Arm, Patient Position: Sitting, Cuff Size: Normal)   Pulse 84   Ht 5' 8 (1.727 m)   Wt 206 lb (93.4 kg)   SpO2 98%   BMI 31.32 kg/m  BP Readings from Last 3  Encounters:  09/25/24 130/88  09/15/24 118/76  08/28/24 116/60   Wt Readings from Last 3 Encounters:  09/25/24 206 lb (93.4 kg)  09/15/24 206 lb 12.8 oz (93.8 kg)  08/28/24 209 lb (94.8 kg)     Physical Exam Vitals and nursing note reviewed.  Constitutional:      Appearance: Normal appearance.  HENT:     Head: Normocephalic.  Eyes:     Extraocular Movements: Extraocular movements intact.     Pupils: Pupils are equal, round, and reactive to light.  Cardiovascular:     Rate and Rhythm: Normal rate and regular rhythm.  Pulmonary:     Effort: Pulmonary effort is normal.     Breath sounds: Normal breath sounds.  Musculoskeletal:     Cervical back: Normal range of motion and neck supple.     Right knee: No swelling. Decreased range of motion.     Left knee: Normal.  Neurological:     Mental Status: He is alert and oriented to person, place, and time.  Psychiatric:        Mood and Affect: Mood normal.        Thought Content: Thought content normal.      No results found for any visits on 09/25/24.    The ASCVD Risk score (Arnett DK, et al., 2019) failed to calculate for the following reasons:   Risk score cannot be calculated because patient has a medical history suggesting prior/existing ASCVD    Assessment & Plan:   Problem List Items Addressed This Visit       Musculoskeletal and Integument   Primary osteoarthritis of right knee   Chronic osteoarthritis with significant pain and functional limitation. Considering surgery to minimize interference with activities. - Scheduled right knee replacement surgery for winter. - Instructed to hold aspirin  and Effient  for 7 days prior to surgery.        Other   Restless leg syndrome   Insomnia   Chronic insomnia managed with zolpidem  for 20 years. Requests 90-day supply. - Prescribed zolpidem  (Ambien ) with a 90-day supply.      Relevant Medications   zolpidem  (AMBIEN ) 10 MG tablet   Chronic musculoskeletal pain    Chronic pain, multiple sites Chronic pain in knees, back, neck, feet, and hands. Managed with tramadol  as needed. - Continue tramadol  as needed for pain management.      Other Visit Diagnoses       Preoperative clearance    -  Primary   - Scheduled right knee replacement surgery for winter. - Instructed to hold aspirin  and Effient  for 7 days prior to surgery.        No follow-ups on file.    Leita Longs, FNP

## 2024-09-25 NOTE — Assessment & Plan Note (Signed)
 Chronic insomnia managed with zolpidem  for 20 years. Requests 90-day supply. - Prescribed zolpidem  (Ambien ) with a 90-day supply.

## 2024-09-25 NOTE — Assessment & Plan Note (Signed)
 Chronic osteoarthritis with significant pain and functional limitation. Considering surgery to minimize interference with activities. - Scheduled right knee replacement surgery for winter. - Instructed to hold aspirin  and Effient  for 7 days prior to surgery.

## 2024-09-26 ENCOUNTER — Telehealth: Payer: Self-pay

## 2024-09-26 NOTE — Telephone Encounter (Signed)
 Faxed Copied  Sent to scan

## 2024-10-05 DIAGNOSIS — H35033 Hypertensive retinopathy, bilateral: Secondary | ICD-10-CM | POA: Diagnosis not present

## 2024-10-05 DIAGNOSIS — H43812 Vitreous degeneration, left eye: Secondary | ICD-10-CM | POA: Diagnosis not present

## 2024-10-05 DIAGNOSIS — H47012 Ischemic optic neuropathy, left eye: Secondary | ICD-10-CM | POA: Diagnosis not present

## 2024-10-05 DIAGNOSIS — H47011 Ischemic optic neuropathy, right eye: Secondary | ICD-10-CM | POA: Diagnosis not present

## 2024-10-05 DIAGNOSIS — H35373 Puckering of macula, bilateral: Secondary | ICD-10-CM | POA: Diagnosis not present

## 2024-10-17 ENCOUNTER — Other Ambulatory Visit: Payer: Self-pay

## 2024-10-17 DIAGNOSIS — G4700 Insomnia, unspecified: Secondary | ICD-10-CM

## 2024-10-17 MED ORDER — ZOLPIDEM TARTRATE 10 MG PO TABS: 10.0000 mg | ORAL_TABLET | Freq: Every day | ORAL | 2 refills | Status: DC

## 2024-11-05 ENCOUNTER — Encounter: Payer: Self-pay | Admitting: Pulmonary Disease

## 2024-11-09 ENCOUNTER — Other Ambulatory Visit: Payer: Self-pay

## 2024-11-09 DIAGNOSIS — I10 Essential (primary) hypertension: Secondary | ICD-10-CM

## 2024-11-09 NOTE — Telephone Encounter (Signed)
 Please advise

## 2024-11-10 ENCOUNTER — Telehealth: Payer: Self-pay

## 2024-11-10 ENCOUNTER — Other Ambulatory Visit (HOSPITAL_COMMUNITY): Payer: Self-pay

## 2024-11-10 NOTE — Telephone Encounter (Signed)
 Pharmacy Patient Advocate Encounter   Received notification from Physician's Office that prior authorization for Repatha  sureclick 140mg /ml is required/requested.   Insurance verification completed.   The patient is insured through [facility] ADV[employer].   Per test claim: Refill too soon. PA is not needed at this time. Medication was filled 11/01/2024. Next eligible fill date is 11/22/2024.

## 2024-11-10 NOTE — Telephone Encounter (Signed)
 Copied from CRM [id number]. Topic: General - Other >> Nov 10, 2024 12:00 PM [name] wrote: Reason for CRM: health team advantage needs clinical info to complete prior auth-  Evolocumab  (REPATHA  SURECLICK) 140 MG/ML SOAJ  PH: [phone]- opt 2

## 2024-11-11 ENCOUNTER — Other Ambulatory Visit: Payer: Self-pay

## 2024-11-13 ENCOUNTER — Encounter: Payer: Self-pay | Admitting: Pulmonary Disease

## 2024-11-13 ENCOUNTER — Other Ambulatory Visit (HOSPITAL_COMMUNITY): Payer: Self-pay

## 2024-11-13 ENCOUNTER — Telehealth: Payer: Self-pay | Admitting: Pharmacy Technician

## 2024-11-13 NOTE — Patient Instructions (Signed)
 SURGICAL WAITING ROOM VISITATION Patients having surgery or a procedure may have no more than 2 support people in the waiting area - these visitors may rotate in the visitor waiting room.   Due to an increase in RSV and influenza rates and associated hospitalizations, children ages 36 and under may not visit patients in [facility] hospitals. If the patient needs to stay at the hospital during part of their recovery, the visitor guidelines for inpatient rooms apply.  PRE-OP VISITATION  Pre-op nurse will coordinate an appropriate time for 1 support person to accompany the patient in pre-op.  This support person may not rotate.  This visitor will be contacted when the time is appropriate for the visitor to come back in the pre-op area.  Please refer to the [facility] website for the visitor guidelines for Inpatients (after your surgery is over and you are in a regular room).  You are not required to quarantine at this time prior to your surgery. However, you must do this: Hand Hygiene often Do NOT share personal items Notify your provider if you are in close contact with someone who has COVID or you develop fever 100.4 or greater, new onset of sneezing, cough, sore throat, shortness of breath or body aches.  If you test positive for Covid or have been in contact with anyone that has tested positive in the last 10 days please notify you surgeon.    Your procedure is scheduled on:  11/21/24  Report to [facility] Main Entrance: [address] entrance where the [employer] is available.   Report to admitting at: 5:15 AM  Call this number if you have any questions or problems the morning of surgery [phone]  FOLLOW ANY ADDITIONAL PRE OP INSTRUCTIONS YOU RECEIVED FROM YOUR SURGEON'S OFFICE!!!  Do not eat food after Midnight the night prior to your surgery/procedure.  After Midnight you may have the following liquids until: 4:15 AM DAY OF SURGERY  Clear Liquid Diet Water  Black  Coffee (sugar ok, NO MILK/CREAM OR CREAMERS)  Tea (sugar ok, NO MILK/CREAM OR CREAMERS) regular and decaf                             Plain Jell-O  with no fruit (NO RED)                                           Fruit ices (not with fruit pulp, NO RED)                                     Popsicles (NO RED)                                                                  Juice: NO CITRUS JUICES: only apple, WHITE grape, WHITE cranberry Sports drinks like Gatorade or Powerade (NO RED)   The day of surgery:  Drink ONE (1) Pre-Surgery Clear Ensure at : 4:15 AM the morning of surgery. Drink in one sitting. Do not sip.  This drink was given  to you during your hospital pre-op appointment visit. Nothing else to drink after completing the Pre-Surgery Clear Ensure or G2 : No candy, chewing gum or throat lozenges.    Oral Hygiene is also important to reduce your risk of infection.        Remember - BRUSH YOUR TEETH THE MORNING OF SURGERY WITH YOUR REGULAR TOOTHPASTE  Do NOT smoke after Midnight the night before surgery.  STOP TAKING all Vitamins, Herbs and supplements 1 week before your surgery.   Take ONLY these medicines the morning of surgery with A SIP OF WATER : pregabalin(lyrica),Duloxetine (Cymbalta ),metoprolol (Toprol ))                   You may not have any metal on your body including hair pins, jewelry, and body piercing  Do not wear lotions, powders, perfumes / cologne, or deodorant  Do not shave 48 hours prior to surgery to avoid nicks in your skin which may contribute to postoperative infections.   Men may shave face and neck.  Contacts, Hearing Aids, dentures or bridgework may not be worn into surgery. DENTURES WILL BE REMOVED PRIOR TO SURGERY PLEASE DO NOT APPLY Poly grip OR ADHESIVES!!!  You may bring a small overnight bag with you on the day of surgery, only pack items that are not valuable. [facility] IS NOT RESPONSIBLE   FOR VALUABLES THAT ARE LOST OR STOLEN.   Patients  discharged on the day of surgery will not be allowed to drive home.  Someone NEEDS to stay with you for the first 24 hours after anesthesia.  Do not bring your home medications to the hospital. The Pharmacy will dispense medications listed on your medication list to you during your admission in the Hospital.  Special Instructions: Bring a copy of your healthcare power of attorney and living will documents the day of surgery, if you wish to have them scanned into your [facility] Records- EPIC  Please read over the following fact sheets you were given: IF YOU HAVE QUESTIONS ABOUT YOUR PRE-OP INSTRUCTIONS, PLEASE CALL [phone]  PATIENT SIGNATURE_________________________________  NURSE SIGNATURE__________________________________  ________________________________________________________________________  Pre-operative 4 CHG Bath Instructions  DYNA-Hex 4 Chlorhexidine  Gluconate 4% Solution Antiseptic 4 fl. oz   You can play a key role in reducing the risk of infection after surgery. Your skin needs to be as free of germs as possible. You can reduce the number of germs on your skin by washing with CHG (chlorhexidine  gluconate) soap before surgery. CHG is an antiseptic soap that kills germs and continues to kill germs even after washing.   DO NOT use if you have an allergy to chlorhexidine /CHG or antibacterial soaps. If your skin becomes reddened or irritated, stop using the CHG and notify one of our RNs at   Please shower with the CHG soap starting 4 days before surgery using the following schedule:     Please keep in mind the following:  DO NOT shave, including legs and underarms, starting the day of your first shower.   You may shave your face at any point before/day of surgery.  Place clean sheets on your bed the day you start using CHG soap. Use a clean washcloth (not used since being washed) for each shower. DO NOT sleep with pets once you start using the CHG.  CHG Shower  Instructions:  If you choose to wash your hair and private area, wash first with your normal shampoo/soap.  After you use shampoo/soap, rinse your hair and body thoroughly to remove  shampoo/soap residue.  Turn the water  OFF and apply about 3 tablespoons (45 ml) of CHG soap to a CLEAN washcloth.  Apply CHG soap ONLY FROM YOUR NECK DOWN TO YOUR TOES (washing for 3-5 minutes)  DO NOT use CHG soap on face, private areas, open wounds, or sores.  Pay special attention to the area where your surgery is being performed.  If you are having back surgery, having someone wash your back for you may be helpful. Wait 2 minutes after CHG soap is applied, then you may rinse off the CHG soap.  Pat dry with a clean towel  Put on clean clothes/pajamas   If you choose to wear lotion, please use ONLY the CHG-compatible lotions on the back of this paper.     Additional instructions for the day of surgery: DO NOT APPLY any lotions, deodorants, cologne, or perfumes.   Put on clean/comfortable clothes.  Brush your teeth.  Ask your nurse before applying any prescription medications to the skin.   CHG Compatible Lotions   Aveeno Moisturizing lotion  Cetaphil Moisturizing Cream  Cetaphil Moisturizing Lotion  Clairol Herbal Essence Moisturizing Lotion, Dry Skin  Clairol Herbal Essence Moisturizing Lotion, Extra Dry Skin  Clairol Herbal Essence Moisturizing Lotion, Normal Skin  Curel Age Defying Therapeutic Moisturizing Lotion with Alpha Hydroxy  Curel Extreme Care Body Lotion  Curel Soothing Hands Moisturizing Hand Lotion  Curel Therapeutic Moisturizing Cream, Fragrance-Free  Curel Therapeutic Moisturizing Lotion, Fragrance-Free  Curel Therapeutic Moisturizing Lotion, Original Formula  Eucerin Daily Replenishing Lotion  Eucerin Dry Skin Therapy Plus Alpha Hydroxy Crme  Eucerin Dry Skin Therapy Plus Alpha Hydroxy Lotion  Eucerin Original Crme  Eucerin Original Lotion  Eucerin Plus Crme Eucerin Plus  Lotion  Eucerin TriLipid Replenishing Lotion  Keri Anti-Bacterial Hand Lotion  Keri Deep Conditioning Original Lotion Dry Skin Formula Softly Scented  Keri Deep Conditioning Original Lotion, Fragrance Free Sensitive Skin Formula  Keri Lotion Fast Absorbing Fragrance Free Sensitive Skin Formula  Keri Lotion Fast Absorbing Softly Scented Dry Skin Formula  Keri Original Lotion  Keri Skin Renewal Lotion Keri Silky Smooth Lotion  Keri Silky Smooth Sensitive Skin Lotion  Nivea Body Creamy Conditioning Oil  Nivea Body Extra Enriched Lotion  Nivea Body Original Lotion  Nivea Body Sheer Moisturizing Lotion Nivea Crme  Nivea Skin Firming Lotion  NutraDerm 30 Skin Lotion  NutraDerm Skin Lotion  NutraDerm Therapeutic Skin Cream  NutraDerm Therapeutic Skin Lotion  ProShield Protective Hand Cream  Provon moisturizing lotion  Incentive Spirometer  An incentive spirometer is a tool that can help keep your lungs clear and active. This tool measures how well you are filling your lungs with each breath. Taking long deep breaths may help reverse or decrease the chance of developing breathing (pulmonary) problems (especially infection) following: A long period of time when you are unable to move or be active. BEFORE THE PROCEDURE  If the spirometer includes an indicator to show your best effort, your nurse or respiratory therapist will set it to a desired goal. If possible, sit up straight or lean slightly forward. Try not to slouch. Hold the incentive spirometer in an upright position. INSTRUCTIONS FOR USE  Sit on the edge of your bed if possible, or sit up as far as you can in bed or on a chair. Hold the incentive spirometer in an upright position. Breathe out normally. Place the mouthpiece in your mouth and seal your lips tightly around it. Breathe in slowly and as deeply as possible, raising the piston  or the ball toward the top of the column. Hold your breath for 3-5 seconds or for as long as  possible. Allow the piston or ball to fall to the bottom of the column. Remove the mouthpiece from your mouth and breathe out normally. Rest for a few seconds and repeat Steps 1 through 7 at least 10 times every 1-2 hours when you are awake. Take your time and take a few normal breaths between deep breaths. The spirometer may include an indicator to show your best effort. Use the indicator as a goal to work toward during each repetition. After each set of 10 deep breaths, practice coughing to be sure your lungs are clear. If you have an incision (the cut made at the time of surgery), support your incision when coughing by placing a pillow or rolled up towels firmly against it. Once you are able to get out of bed, walk around indoors and cough well. You may stop using the incentive spirometer when instructed by your caregiver.  RISKS AND COMPLICATIONS Take your time so you do not get dizzy or light-headed. If you are in pain, you may need to take or ask for pain medication before doing incentive spirometry. It is harder to take a deep breath if you are having pain. AFTER USE Rest and breathe slowly and easily. It can be helpful to keep track of a log of your progress. Your caregiver can provide you with a simple table to help with this. If you are using the spirometer at home, follow these instructions: SEEK MEDICAL CARE IF:  You are having difficultly using the spirometer. You have trouble using the spirometer as often as instructed. Your pain medication is not giving enough relief while using the spirometer. You develop fever of 100.5 F (38.1 C) or higher. SEEK IMMEDIATE MEDICAL CARE IF:  You cough up bloody sputum that had not been present before. You develop fever of 102 F (38.9 C) or greater. You develop worsening pain at or near the incision site. MAKE SURE YOU:  Understand these instructions. Will watch your condition. Will get help right away if you are not doing well or get  worse. Document Released: 02/22/2007 Document Revised: 01/04/2012 Document Reviewed: 04/25/2007 [facility] Patient Information 2014 [place], [place].   ________________________________________________________________________

## 2024-11-13 NOTE — Telephone Encounter (Signed)
 Pharmacy Patient Advocate Encounter   Received notification from Patient Advice Request messages that prior authorization for Repatha  is required/requested.   Insurance verification completed.   The patient is insured through [facility] ADVANTAGE/[employer].   Per test claim: PA required and submitted KEY/EOC/Request #: [id number] due to a denial already being on file.  There is a previously denied request on file, please contact [employer] for further assistance-  Msgd office back to f/u. I do not see where the PA team submitted a PA.

## 2024-11-14 ENCOUNTER — Other Ambulatory Visit: Payer: Self-pay

## 2024-11-14 ENCOUNTER — Encounter (HOSPITAL_COMMUNITY)
Admission: RE | Admit: 2024-11-14 | Discharge: 2024-11-14 | Disposition: A | Discharge status: Home / Self Care | Payer: Self-pay | Source: Ambulatory Visit | Attending: Orthopedic Surgery | Admitting: Orthopedic Surgery

## 2024-11-14 ENCOUNTER — Encounter (HOSPITAL_COMMUNITY): Payer: Self-pay

## 2024-11-14 VITALS — BP 149/79 | HR 77 | Temp 98.4°F | Ht 68.0 in | Wt 207.0 lb

## 2024-11-14 DIAGNOSIS — N189 Chronic kidney disease, unspecified: Secondary | ICD-10-CM | POA: Diagnosis not present

## 2024-11-14 DIAGNOSIS — I10 Essential (primary) hypertension: Secondary | ICD-10-CM

## 2024-11-14 DIAGNOSIS — Z01818 Encounter for other preprocedural examination: Secondary | ICD-10-CM

## 2024-11-14 DIAGNOSIS — G2581 Restless legs syndrome: Secondary | ICD-10-CM

## 2024-11-14 DIAGNOSIS — I129 Hypertensive chronic kidney disease with stage 1 through stage 4 chronic kidney disease, or unspecified chronic kidney disease: Secondary | ICD-10-CM | POA: Diagnosis not present

## 2024-11-14 DIAGNOSIS — M1711 Unilateral primary osteoarthritis, right knee: Secondary | ICD-10-CM | POA: Insufficient documentation

## 2024-11-14 DIAGNOSIS — I252 Old myocardial infarction: Secondary | ICD-10-CM | POA: Insufficient documentation

## 2024-11-14 DIAGNOSIS — Z01812 Encounter for preprocedural laboratory examination: Secondary | ICD-10-CM | POA: Insufficient documentation

## 2024-11-14 HISTORY — DX: Acute myocardial infarction, unspecified: I21.9

## 2024-11-14 HISTORY — DX: Restless legs syndrome: G25.81

## 2024-11-14 HISTORY — DX: Chronic kidney disease, unspecified: N18.9

## 2024-11-14 LAB — BASIC METABOLIC PANEL WITH GFR
Anion gap: 7 (ref 5–15)
BUN: 24 mg/dL — ABNORMAL HIGH (ref 8–23)
CO2: 28 mmol/L (ref 22–32)
Calcium: 9.5 mg/dL (ref 8.9–10.3)
Chloride: 105 mmol/L (ref 98–111)
Creatinine, Ser: 1.38 mg/dL — ABNORMAL HIGH (ref 0.61–1.24)
GFR, Estimated: 53 mL/min — ABNORMAL LOW
Glucose, Bld: 95 mg/dL (ref 70–99)
Potassium: 4.7 mmol/L (ref 3.5–5.1)
Sodium: 141 mmol/L (ref 135–145)

## 2024-11-14 LAB — CBC
HCT: 38.3 % — ABNORMAL LOW (ref 39.0–52.0)
Hemoglobin: 12.9 g/dL — ABNORMAL LOW (ref 13.0–17.0)
MCH: 30.8 pg (ref 26.0–34.0)
MCHC: 33.7 g/dL (ref 30.0–36.0)
MCV: 91.4 fL (ref 80.0–100.0)
Platelets: 235 K/uL (ref 150–400)
RBC: 4.19 MIL/uL — ABNORMAL LOW (ref 4.22–5.81)
RDW: 12.6 % (ref 11.5–15.5)
WBC: 7.3 K/uL (ref 4.0–10.5)
nRBC: 0 % (ref 0.0–0.2)

## 2024-11-14 LAB — SURGICAL PCR SCREEN
MRSA, PCR: NEGATIVE
Staphylococcus aureus: NEGATIVE

## 2024-11-14 NOTE — Progress Notes (Signed)
 For Anesthesia: PCP - Bevely Doffing, FNP  Cardiologist - Alm Clay, MD  Clearance: Darryle Thom CROME, PA-C : 08/28/24 Bowel Prep reminder:N/A  Chest x-ray - 12/03/23 EKG - 08/28/24 Stress Test -  ECHO -  Cardiac Cath - 12/06/23 Pacemaker/ICD device last checked: Pacemaker orders received: Device Rep notified:  Spinal Cord Stimulator:N/A Hypoglossal stimulator implant : Right chest.Pt. will bring control the DOS. Sleep Study - Yes CPAP - N/A  Fasting Blood Sugar - N/A Checks Blood Sugar _____ times a day Date and result of last Hgb A1c-  Last dose of GLP1 agonist- N/A GLP1 instructions: Hold 7 days prior to schedule (Hold 24 hours-daily)   Last dose of SGLT-2 inhibitors- N/A SGLT-2 instructions: Hold 72 hours prior to surgery  Blood Thinner Instructions: prasugrel  on hold since: 11/13/24 Last Dose: Time last taken:  Aspirin  Instructions: On hold since: 11/13/24 Last Dose: Time last taken:  Activity level: Can go up a flight of stairs and activities of daily living without stopping and without chest pain and/or shortness of breath   Able to exercise without chest pain and/or shortness of breath  Anesthesia review: Hx: HTN,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

## 2024-11-15 ENCOUNTER — Other Ambulatory Visit: Payer: Self-pay

## 2024-11-15 DIAGNOSIS — G4700 Insomnia, unspecified: Secondary | ICD-10-CM

## 2024-11-15 MED ORDER — ZOLPIDEM TARTRATE 10 MG PO TABS: 10.0000 mg | ORAL_TABLET | Freq: Every day | ORAL | 2 refills | Status: AC

## 2024-11-15 NOTE — Anesthesia Preprocedure Evaluation (Addendum)
"                                    Anesthesia Evaluation  Patient identified by MRN, date of birth, ID band Patient awake    Reviewed: Allergy & Precautions, H&P , NPO status , Patient's Chart, lab work & pertinent test results  Airway Mallampati: II   Neck ROM: full    Dental   Pulmonary sleep apnea , former smoker   breath sounds clear to auscultation       Cardiovascular hypertension, + CAD, + Past MI and + Cardiac Stents   Rhythm:regular Rate:Normal     Neuro/Psych  PSYCHIATRIC DISORDERS Anxiety Depression     Neuromuscular disease    GI/Hepatic   Endo/Other    Renal/GU Renal diseasestones     Musculoskeletal  (+) Arthritis ,    Abdominal   Peds  Hematology   Anesthesia Other Findings   Reproductive/Obstetrics                              Anesthesia Physical Anesthesia Plan  ASA: 3  Anesthesia Plan: MAC and Spinal   Post-op Pain Management: Regional block*   Induction: Intravenous  PONV Risk Score and Plan: 1 and Propofol  infusion and Treatment may vary due to age or medical condition  Airway Management Planned: Simple Face Mask  Additional Equipment:   Intra-op Plan:   Post-operative Plan:   Informed Consent: I have reviewed the patients History and Physical, chart, labs and discussed the procedure including the risks, benefits and alternatives for the proposed anesthesia with the patient or authorized representative who has indicated his/her understanding and acceptance.     Dental advisory given  Plan Discussed with: CRNA, Anesthesiologist and Surgeon  Anesthesia Plan Comments: (See PAT note from 1/20)         Anesthesia Quick Evaluation  "

## 2024-11-15 NOTE — Progress Notes (Addendum)
 " Case: [id number] Date/Time: 11/21/24 0700   Procedure: ARTHROPLASTY, KNEE, UNICOMPARTMENTAL (Right: Knee)   Anesthesia type: Spinal   Diagnosis: Primary osteoarthritis of right knee [M17.11]   Pre-op diagnosis: Right knee medial compartmental osteoarthritis   Location: [facility] ROOM 09 / [facility]   Surgeons: Ernie Cough, MD       DISCUSSION: [name] is a 74 yo male with PMH of former smoking, Hx of NSTEMI (11/2023) and CAD s/p PCI to LAD x 2 (11/2023), HTN, OSA s/p Inspire (2023), RLS, partial nephrectomy, CKD, anemia, anxiety, arthritis.  Patient with hx of difficult airway noted during last surgery on 09/09/2022 with following comments: Difficulty Due To: Difficult Airway- due to large tongue, Difficult Airway- due to anterior larynx and Difficulty was anticipated Comments: First attempt by CRNA with Glidescope #4, good view of glottis but unable to position ETT anterior enough to pass through cords. Second and third attempts by myself with Glidescope #4 blade, rigid stylet removed between attempts and additional bend made in stylet to be able to reach anterior glottic opening. Mask ventilated between 2nd and 3rd attempt. VSS. SpO2 >97% throughout. Lawence, MD  Patient follows with Cardiology for hx of NSTEMI in 11/2023. Underwent PCI x 2 to the proximal and mid LAD. Last seen in clinic on 08/28/24 for pre op clearance. Noted to be doing well, active without cardiac symptoms. Echo was ordered to f/u on mild aortic dilation. Cleared for surgery:  Preop evaluation Overall stable from a cardiac perspective and with good exercise capacity able to complete greater than 4 METS.  RCRI is 1.1%.  At this time he is at acceptable risk for upcoming procedure without any further testing. Procedures planned for 11/21/2024.  Ideally he would have completed uninterrupted 1 year of DAPT therapy but since this has already been scheduled and close to his 12 months, would continue on aspirin .  His Effient  can be  held 7 days prior.  Reinstate when safe to do so.  Case discussed with Dr. Anner. Will route note to surgeon.  LD Effient  1/19  VS: BP (!) 149/79   Pulse 77   Temp 36.9 C (Oral)   Ht 5' 8 (1.727 m)   Wt 93.9 kg   SpO2 99%   BMI 31.47 kg/m   PROVIDERS: Bevely Doffing, FNP   LABS: Labs reviewed: Acceptable for surgery. (all labs ordered are listed, but only abnormal results are displayed)  Labs Reviewed  BASIC METABOLIC PANEL WITH GFR - Abnormal; Notable for the following components:      Result Value   BUN 24 (*)    Creatinine, Ser 1.38 (*)    GFR, Estimated 53 (*)    All other components within normal limits  CBC - Abnormal; Notable for the following components:   RBC 4.19 (*)    Hemoglobin 12.9 (*)    HCT 38.3 (*)    All other components within normal limits  SURGICAL PCR SCREEN    EKG 08/28/2024:  Normal sinus rhythm Nonspecific T wave abnormality   LHC 12/06/23:    CULPRIT LESION (#1): 1st Diag lesion is 90% stenosed.   A drug-eluting stent was successfully placed using a SYNERGY XD 2.50X12. - Deployed to 2.6 mm; Post intervention, there is a 0% residual stenosis.  TIMI-3 flow maintained   LESION #2: Prox LAD to Mid LAD lesion is 70% stenosed.  TIMI-3 flow   A drug-eluting stent was successfully placed using a SYNERGY XD 3.0X28.-Deployed to 3.2 mm; Post intervention, there is  a 0% residual stenosis. TIMI-3 flow maintained   Mid LAD to Dist LAD lesion is 20% stenosed.  Dist LAD-1 lesion is 30% stenosed. Dist LAD-2 lesion is 60% stenosed.   LV end diastolic pressure is moderately elevated.   POST-OPERATIVE DIAGNOSIS: Left Dominant System with Two-Vessel CAD: CULPRIT LESION: Major D1 with proximal 90% stenosis (Cath Works FFR 0.56); TIMI-3 flow Successful DES PCI with Synergy XD 2.5 mm x 12 mm stent deployed to 2.6 mm-reduced to 0%, TIMI-3 flow maintained Proximal-Mid LAD segmental (from D1-SP1) 70% stenosis (Cath Works FFR 0.79), TIMI-3 flow  Successful DES  PCI with Synergy XD 3.0 mm 28 mm stent deployed to 3.2 mm; reduced to 0% (post PCI cath Works FFR 0.94) TIMI-3 flow maintained Remainder the LAD has a very small caliber D2 with sequential 20 to 30% stenoses followed by an apical 60% stenosis just before distal D3. Large dominant LCx with the major OM1, small LPL 1 and LPL 3 with moderate caliber major LPL 2 and LPDA-free of disease. Small nondominant RCA Preserved LVEF by echocardiogram-no LV gram performed to conserve contrast.  LVEDP estimated 20 mmHg     PLAN OF CARE:  Post cath hydration for 6 hours due to solitary kidney; potential discharge later this evening versus tomorrow morning after renal function assessment.  DAPT x 1 year with ASA/Effient , after 1 year will convert to Plavix monotherapy to complete a second year.  Continue GDMT titration for CAD   Echo 12/04/23:  IMPRESSIONS    1. No evidence of LV thrombus. Definity  is administered. Left ventricular ejection fraction, by estimation, is 50 to 55%. The left ventricle has low normal function. The left ventricle demonstrates regional wall motion abnormalities (see scoring diagram/findings for description). Left ventricular diastolic parameters are consistent with Grade I diastolic dysfunction (impaired relaxation).  2. Right ventricular systolic function was not well visualized. The right ventricular size is normal. There is normal pulmonary artery systolic pressure.  3. Right atrial size was moderately dilated.  4. The mitral valve is grossly normal. Mild mitral valve regurgitation. No evidence of mitral stenosis.  5. Tricuspid valve regurgitation is moderate.  6. The aortic valve is tricuspid. Aortic valve regurgitation is not visualized.  7. Aortic dilatation noted. There is mild dilatation of the aortic root, measuring 40 mm. There is borderline dilatation of the ascending aorta, measuring 39 mm.  8. The inferior vena cava is normal in size with greater than  50% respiratory variability, suggesting right atrial pressure of 3 mmHg.  Conclusion(s)/Recommendation(s): No left ventricular mural or apical thrombus/thrombi. Past Medical History:  Diagnosis Date   Anemia    Anxiety    Arthritis    spinal stenosis   Chronic kidney disease    Depression    History of kidney stones    Hyperlipidemia    Hypertension    Kidney function abnormal 1983   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   Myocardial infarction (HCC)    Restless legs    Sleep apnea    uses Cpap nightly    Past Surgical History:  Procedure Laterality Date   BACK SURGERY     lumbar 2020   CATARACT EXTRACTION     10-15 years ago   COLONOSCOPY  05/25/2012   Procedure: COLONOSCOPY;  Surgeon: Claudis RAYMOND Rivet, MD;  [facility];  Service: Endoscopy;  Laterality: N/A;  1030   COLONOSCOPY N/A 05/14/2017   Procedure: COLONOSCOPY;  Surgeon: Rivet Claudis RAYMOND,  MD;  [facility];  Service: Endoscopy;  Laterality: N/A;  10:10   CORONARY PRESSURE/FFR WITH 3D MAPPING N/A 12/06/2023   Procedure: Coronary Pressure/FFR w/3D Mapping;  Surgeon: Anner Alm ORN, MD;  Location: [facility] INVASIVE CV LAB;  Service: Cardiovascular;  Laterality: N/A;   CORONARY STENT INTERVENTION N/A 12/06/2023   Procedure: CORONARY STENT INTERVENTION;  Surgeon: Anner Alm ORN, MD;  Location: [facility] INVASIVE CV LAB;  Service: Cardiovascular;  Laterality: N/A;   CYSTOSCOPY/RETROGRADE/URETEROSCOPY Right 06/07/2014   Procedure: CYSTOSCOPY, RIGHT RETROGRADE, RIGHT URETEROSCOPY, RIGHT URETERAL BALLOON DILATATION, BIOPSY, RIGHT URETERAL STENT;  Surgeon: Garnette CHRISTELLA Shack, MD;  Location: [facility];  Service: Urology;  Laterality: Right;   DRUG INDUCED ENDOSCOPY Right 07/21/2022   Procedure: DRUG INDUCED ENDOSCOPY;  Surgeon: Carlie Clark, MD;  Location: [facility];  Service: ENT;  Laterality: Right;   I & D EXTREMITY Left  05/01/2021   Procedure: LEFT INDEX AND LONG FINGER AMPUTATION AND RING FINGER NAIL BED REPAIR.;  Surgeon: Murrell Drivers, MD;  Location: [facility];  Service: Orthopedics;  Laterality: Left;  LEFT INDEX AND LONG FINGER AMPUTATION AND RING FINGER NAIL BED REPAIR.   IMPLANTATION OF HYPOGLOSSAL NERVE STIMULATOR Right 09/09/2022   Procedure: IMPLANTATION OF HYPOGLOSSAL NERVE STIMULATOR;  Surgeon: Carlie Clark, MD;  Location: [facility];  Service: ENT;  Laterality: Right;   kidney stone  1983   Had partial right nephrectomy   KNEE ARTHROSCOPY WITH MEDIAL MENISECTOMY Right 04/14/2018   Procedure: KNEE ARTHROSCOPY WITH MEDIAL MENISECTOMY;  Surgeon: Margrette Taft BRAVO, MD;  Location[facility];  Service: Orthopedics;  Laterality: Right;   left foot surgery  2010   repair of joint   LEFT HEART CATH AND CORONARY ANGIOGRAPHY N/A 12/06/2023   Procedure: LEFT HEART CATH AND CORONARY ANGIOGRAPHY;  Surgeon: Anner Alm ORN, MD;  Location: [facility] INVASIVE CV LAB;  Service: Cardiovascular;  Laterality: N/A;   PARTIAL KNEE ARTHROPLASTY Left 09/16/2021   Procedure: UNICOMPARTMENTAL KNEE MEDIALLY;  Surgeon: Ernie Cough, MD;  Location[facility];  Service: Orthopedics;  Laterality: Left;   REPOSITION OF LENS Right 2024   WRIST SURGERY Bilateral     MEDICATIONS:  aspirin  EC 81 MG tablet   DULoxetine  (CYMBALTA ) 30 MG capsule   Evolocumab  (REPATHA  SURECLICK) 140 MG/ML SOAJ   ferrous sulfate  325 (65 FE) MG tablet   metoprolol  succinate (TOPROL -XL) 25 MG 24 hr tablet   Multiple Vitamin (MULTIVITAMIN WITH MINERALS) TABS tablet   nitroGLYCERIN  (NITROSTAT ) 0.4 MG SL tablet   prasugrel  (EFFIENT ) 10 MG TABS tablet   pregabalin (LYRICA) 150 MG capsule   traMADol  (ULTRAM ) 50 MG tablet   zolpidem  (AMBIEN ) 10 MG tablet   No current facility-administered medications for this encounter.   Burnard CHRISTELLA Odis DEVONNA [facility] Short Stay/Anesthesiology [facility] Phone [phone] 11/15/2024 2:52 PM         "

## 2024-11-16 ENCOUNTER — Other Ambulatory Visit (HOSPITAL_COMMUNITY): Payer: Self-pay

## 2024-11-16 ENCOUNTER — Telehealth: Payer: Self-pay | Admitting: Pharmacy Technician

## 2024-11-16 ENCOUNTER — Other Ambulatory Visit: Payer: Self-pay

## 2024-11-16 ENCOUNTER — Telehealth: Payer: Self-pay

## 2024-11-16 NOTE — Telephone Encounter (Signed)
Please disregard duplicate message.

## 2024-11-16 NOTE — Telephone Encounter (Signed)
 Pharmacy Patient Advocate Encounter   Received notification from [facility] KEY that prior authorization for Tramadol  is required/requested.   Insurance verification completed.   The patient is insured through [facility] ADVANTAGE/RX ADVANCE.   Per test claim: The current 7 day co-pay is, $0.  No PA needed at this time. This test claim was processed through [facility]- copay amounts may vary at other pharmacies due to pharmacy/plan contracts, or as the patient moves through the different stages of their insurance plan.    Did try to start the PA, since he's had it before. KEY: [id number]. Returned: Prior Authorization not required for patient/medication   Pt can only get 7 filled initially, but it can be a 7 day supply.

## 2024-11-16 NOTE — Telephone Encounter (Signed)
 Please advise mychart message.

## 2024-11-21 ENCOUNTER — Encounter (HOSPITAL_COMMUNITY): Payer: Self-pay | Admitting: Certified Registered Nurse Anesthetist

## 2024-11-21 ENCOUNTER — Other Ambulatory Visit: Payer: Self-pay

## 2024-11-21 ENCOUNTER — Encounter (HOSPITAL_COMMUNITY)
Admission: RE | Disposition: A | Discharge status: Home / Self Care | Payer: Self-pay | Source: Ambulatory Visit | Attending: Orthopedic Surgery

## 2024-11-21 ENCOUNTER — Encounter (HOSPITAL_COMMUNITY): Payer: Self-pay | Admitting: Orthopedic Surgery

## 2024-11-21 ENCOUNTER — Ambulatory Visit (HOSPITAL_COMMUNITY): Payer: Self-pay | Admitting: Medical

## 2024-11-21 ENCOUNTER — Ambulatory Visit (HOSPITAL_COMMUNITY)
Admission: RE | Admit: 2024-11-21 | Discharge: 2024-11-21 | Disposition: A | Discharge status: Home / Self Care | Payer: Self-pay | Source: Ambulatory Visit | Attending: Orthopedic Surgery | Admitting: Orthopedic Surgery

## 2024-11-21 DIAGNOSIS — M1711 Unilateral primary osteoarthritis, right knee: Secondary | ICD-10-CM

## 2024-11-21 DIAGNOSIS — Z87891 Personal history of nicotine dependence: Secondary | ICD-10-CM | POA: Diagnosis not present

## 2024-11-21 DIAGNOSIS — G709 Myoneural disorder, unspecified: Secondary | ICD-10-CM | POA: Insufficient documentation

## 2024-11-21 DIAGNOSIS — F32A Depression, unspecified: Secondary | ICD-10-CM | POA: Diagnosis not present

## 2024-11-21 DIAGNOSIS — I2510 Atherosclerotic heart disease of native coronary artery without angina pectoris: Secondary | ICD-10-CM | POA: Diagnosis not present

## 2024-11-21 DIAGNOSIS — I10 Essential (primary) hypertension: Secondary | ICD-10-CM

## 2024-11-21 DIAGNOSIS — I130 Hypertensive heart and chronic kidney disease with heart failure and stage 1 through stage 4 chronic kidney disease, or unspecified chronic kidney disease: Secondary | ICD-10-CM | POA: Insufficient documentation

## 2024-11-21 DIAGNOSIS — G4733 Obstructive sleep apnea (adult) (pediatric): Secondary | ICD-10-CM | POA: Insufficient documentation

## 2024-11-21 DIAGNOSIS — G4730 Sleep apnea, unspecified: Secondary | ICD-10-CM | POA: Diagnosis not present

## 2024-11-21 DIAGNOSIS — I252 Old myocardial infarction: Secondary | ICD-10-CM | POA: Insufficient documentation

## 2024-11-21 DIAGNOSIS — N183 Chronic kidney disease, stage 3 unspecified: Secondary | ICD-10-CM | POA: Diagnosis not present

## 2024-11-21 DIAGNOSIS — Z96651 Presence of right artificial knee joint: Secondary | ICD-10-CM

## 2024-11-21 DIAGNOSIS — F419 Anxiety disorder, unspecified: Secondary | ICD-10-CM | POA: Diagnosis not present

## 2024-11-21 DIAGNOSIS — Z955 Presence of coronary angioplasty implant and graft: Secondary | ICD-10-CM | POA: Diagnosis not present

## 2024-11-21 DIAGNOSIS — M25761 Osteophyte, right knee: Secondary | ICD-10-CM | POA: Insufficient documentation

## 2024-11-21 MED ORDER — PROPOFOL 500 MG/50ML IV EMUL
INTRAVENOUS | Status: DC | PRN
  Administered 2024-11-21: 150 ug/kg/min via INTRAVENOUS

## 2024-11-21 MED ORDER — CEFAZOLIN SODIUM-DEXTROSE 2-4 GM/100ML-% IV SOLN
2.0000 g | Freq: Four times a day (QID) | INTRAVENOUS | Status: DC
  Administered 2024-11-21: 2 g via INTRAVENOUS

## 2024-11-21 MED ORDER — DEXAMETHASONE SOD PHOSPHATE PF 10 MG/ML IJ SOLN
INTRAMUSCULAR | Status: DC | PRN
  Administered 2024-11-21: 5 mg via INTRAVENOUS

## 2024-11-21 MED ORDER — OXYCODONE HCL 5 MG PO TABS
ORAL_TABLET | ORAL | Status: AC
  Filled 2024-11-21: qty 1

## 2024-11-21 MED ORDER — ROPIVACAINE HCL 5 MG/ML IJ SOLN
INTRAMUSCULAR | Status: DC | PRN
  Administered 2024-11-21: 25 mL via PERINEURAL

## 2024-11-21 MED ORDER — METOCLOPRAMIDE HCL 5 MG/ML IJ SOLN: 5.0000 mg | Freq: Three times a day (TID) | INTRAMUSCULAR | Status: DC | PRN

## 2024-11-21 MED ORDER — OXYCODONE HCL 5 MG PO TABS
10.0000 mg | ORAL_TABLET | ORAL | Status: DC | PRN
  Administered 2024-11-21: 10 mg via ORAL

## 2024-11-21 MED ORDER — HYDROMORPHONE HCL 1 MG/ML IJ SOLN: 0.5000 mg | INTRAMUSCULAR | Status: DC | PRN

## 2024-11-21 MED ORDER — PROPOFOL 10 MG/ML IV BOLUS
INTRAVENOUS | Status: AC
  Filled 2024-11-21: qty 20

## 2024-11-21 MED ORDER — BUPIVACAINE-EPINEPHRINE (PF) 0.25% -1:200000 IJ SOLN
INTRAMUSCULAR | Status: AC
  Filled 2024-11-21: qty 30

## 2024-11-21 MED ORDER — DEXAMETHASONE SOD PHOSPHATE PF 10 MG/ML IJ SOLN: 8.0000 mg | Freq: Once | INTRAMUSCULAR | Status: DC

## 2024-11-21 MED ORDER — OXYCODONE HCL 5 MG/5ML PO SOLN: 5.0000 mg | Freq: Once | ORAL | Status: AC | PRN

## 2024-11-21 MED ORDER — ACETAMINOPHEN 500 MG PO TABS
ORAL_TABLET | ORAL | Status: AC
  Filled 2024-11-21: qty 2

## 2024-11-21 MED ORDER — KETOROLAC TROMETHAMINE 30 MG/ML IJ SOLN
INTRAMUSCULAR | Status: AC
  Filled 2024-11-21: qty 1

## 2024-11-21 MED ORDER — ONDANSETRON HCL 4 MG/2ML IJ SOLN
INTRAMUSCULAR | Status: DC | PRN
  Administered 2024-11-21: 4 mg via INTRAVENOUS

## 2024-11-21 MED ORDER — TRANEXAMIC ACID-NACL 1000-0.7 MG/100ML-% IV SOLN
1000.0000 mg | INTRAVENOUS | Status: AC
  Administered 2024-11-21: 1000 mg via INTRAVENOUS
  Filled 2024-11-21: qty 100

## 2024-11-21 MED ORDER — PHENYLEPHRINE HCL-NACL 20-0.9 MG/250ML-% IV SOLN
INTRAVENOUS | Status: DC | PRN
  Administered 2024-11-21: 40 ug/min via INTRAVENOUS

## 2024-11-21 MED ORDER — FENTANYL CITRATE (PF) 100 MCG/2ML IJ SOLN
INTRAMUSCULAR | Status: AC
  Filled 2024-11-21: qty 2

## 2024-11-21 MED ORDER — POLYETHYLENE GLYCOL 3350 17 G PO PACK: 17.0000 g | PACK | Freq: Two times a day (BID) | ORAL | Status: AC

## 2024-11-21 MED ORDER — SODIUM CHLORIDE (PF) 0.9 % IJ SOLN
INTRAMUSCULAR | Status: AC
  Filled 2024-11-21: qty 30

## 2024-11-21 MED ORDER — METOCLOPRAMIDE HCL 5 MG PO TABS
5.0000 mg | ORAL_TABLET | Freq: Three times a day (TID) | ORAL | Status: DC | PRN
  Filled 2024-11-21: qty 2

## 2024-11-21 MED ORDER — PROPOFOL 10 MG/ML IV BOLUS
INTRAVENOUS | Status: DC | PRN
  Administered 2024-11-21 (×4): 20 mg via INTRAVENOUS

## 2024-11-21 MED ORDER — CYCLOBENZAPRINE HCL 5 MG PO TABS: 5.0000 mg | ORAL_TABLET | Freq: Three times a day (TID) | ORAL | 0 refills | Status: AC | PRN

## 2024-11-21 MED ORDER — CEFAZOLIN SODIUM-DEXTROSE 2-4 GM/100ML-% IV SOLN
2.0000 g | INTRAVENOUS | Status: AC
  Administered 2024-11-21: 2 g via INTRAVENOUS
  Filled 2024-11-21: qty 100

## 2024-11-21 MED ORDER — SODIUM CHLORIDE (PF) 0.9 % IJ SOLN
INTRAMUSCULAR | Status: DC | PRN
  Administered 2024-11-21: 61 mL

## 2024-11-21 MED ORDER — FENTANYL CITRATE (PF) 50 MCG/ML IJ SOSY: 25.0000 ug | PREFILLED_SYRINGE | INTRAMUSCULAR | Status: DC | PRN

## 2024-11-21 MED ORDER — PROPOFOL 1000 MG/100ML IV EMUL
INTRAVENOUS | Status: AC
  Filled 2024-11-21: qty 100

## 2024-11-21 MED ORDER — OXYCODONE HCL 5 MG PO TABS
ORAL_TABLET | ORAL | Status: AC
  Filled 2024-11-21: qty 2

## 2024-11-21 MED ORDER — ORAL CARE MOUTH RINSE: 15.0000 mL | Freq: Once | OROMUCOSAL | Status: AC

## 2024-11-21 MED ORDER — OXYCODONE HCL 5 MG PO TABS: 5.0000 mg | ORAL_TABLET | ORAL | 0 refills | Status: AC | PRN

## 2024-11-21 MED ORDER — CEFAZOLIN SODIUM-DEXTROSE 2-4 GM/100ML-% IV SOLN
INTRAVENOUS | Status: AC
  Filled 2024-11-21: qty 100

## 2024-11-21 MED ORDER — ONDANSETRON HCL 4 MG/2ML IJ SOLN: 4.0000 mg | Freq: Four times a day (QID) | INTRAMUSCULAR | Status: DC | PRN

## 2024-11-21 MED ORDER — TRANEXAMIC ACID-NACL 1000-0.7 MG/100ML-% IV SOLN
1000.0000 mg | Freq: Once | INTRAVENOUS | Status: AC
  Administered 2024-11-21: 1000 mg via INTRAVENOUS

## 2024-11-21 MED ORDER — FENTANYL CITRATE (PF) 100 MCG/2ML IJ SOLN
INTRAMUSCULAR | Status: DC | PRN
  Administered 2024-11-21: 50 ug via INTRAVENOUS

## 2024-11-21 MED ORDER — ACETAMINOPHEN 500 MG PO TABS
1000.0000 mg | ORAL_TABLET | Freq: Four times a day (QID) | ORAL | Status: DC
  Administered 2024-11-21: 1000 mg via ORAL

## 2024-11-21 MED ORDER — STERILE WATER FOR IRRIGATION IR SOLN
Status: DC | PRN
  Administered 2024-11-21: 2000 mL

## 2024-11-21 MED ORDER — 0.9 % SODIUM CHLORIDE (POUR BTL) OPTIME
TOPICAL | Status: DC | PRN
  Administered 2024-11-21: 1000 mL

## 2024-11-21 MED ORDER — MEPIVACAINE HCL (PF) 2 % IJ SOLN
INTRAMUSCULAR | Status: DC | PRN
  Administered 2024-11-21: 3 mL via INTRATHECAL

## 2024-11-21 MED ORDER — ONDANSETRON HCL 4 MG PO TABS
4.0000 mg | ORAL_TABLET | Freq: Four times a day (QID) | ORAL | Status: DC | PRN
  Filled 2024-11-21: qty 1

## 2024-11-21 MED ORDER — LACTATED RINGERS IV BOLUS
500.0000 mL | Freq: Once | INTRAVENOUS | Status: AC
  Administered 2024-11-21: 500 mL via INTRAVENOUS

## 2024-11-21 MED ORDER — MEPIVACAINE HCL (PF) 2 % IJ SOLN
INTRAMUSCULAR | Status: AC
  Filled 2024-11-21: qty 20

## 2024-11-21 MED ORDER — POVIDONE-IODINE 10 % EX SWAB: 2.0000 | Freq: Once | CUTANEOUS | Status: DC

## 2024-11-21 MED ORDER — SENNA 8.6 MG PO TABS: 1.0000 | ORAL_TABLET | Freq: Every day | ORAL | 0 refills | Status: AC

## 2024-11-21 MED ORDER — CHLORHEXIDINE GLUCONATE 0.12 % MT SOLN
15.0000 mL | Freq: Once | OROMUCOSAL | Status: AC
  Administered 2024-11-21: 15 mL via OROMUCOSAL

## 2024-11-21 MED ORDER — ASPIRIN 81 MG PO CHEW: 81.0000 mg | CHEWABLE_TABLET | Freq: Two times a day (BID) | ORAL | 0 refills | Status: AC

## 2024-11-21 MED ORDER — LACTATED RINGERS IV SOLN: INTRAVENOUS | Status: DC

## 2024-11-21 MED ORDER — TRANEXAMIC ACID-NACL 1000-0.7 MG/100ML-% IV SOLN
INTRAVENOUS | Status: AC
  Filled 2024-11-21: qty 100

## 2024-11-21 MED ORDER — OXYCODONE HCL 5 MG PO TABS
5.0000 mg | ORAL_TABLET | Freq: Once | ORAL | Status: AC | PRN
  Administered 2024-11-21: 5 mg via ORAL

## 2024-11-21 MED ORDER — LACTATED RINGERS IV SOLN: INTRAVENOUS | Status: DC | PRN

## 2024-11-21 MED ORDER — OXYCODONE HCL 5 MG PO TABS: 5.0000 mg | ORAL_TABLET | ORAL | Status: DC | PRN

## 2024-11-21 NOTE — Anesthesia Procedure Notes (Signed)
 Procedure Name: MAC Date/Time: 11/21/2024 7:08 AM  Performed by: Franchot Delon RAMAN, CRNAPre-anesthesia Checklist: Patient identified, Emergency Drugs available, Suction available and Patient being monitored Oxygen Delivery Method: Simple face mask Ventilation: Oral airway inserted - appropriate to patient size Placement Confirmation: positive ETCO2 Dental Injury: Teeth and Oropharynx as per pre-operative assessment

## 2024-11-21 NOTE — Evaluation (Signed)
 Physical Therapy Evaluation Patient Details Name: [name] MRN: [id number] DOB: 1951/06/07 Today's Date: 11/21/2024  History of Present Illness  Patient is 74 y.o. male s/p  R medial unicompartmental knee arthroplasty on 11/21/2024 due to failure of conservative measures. Pt PMH includes but is not limited to: anemia, OA, GERD, HLD, HTN, NSTEMI, CKD III, OSA, spinal stenosis of cervical and lumbar region, lumbar surgery, RLS, and L unicompartmental knee arthroplasty (2022).  Clinical Impression      [name] is a 74 y.o. male POD 0 s/p R partial knee replacement. Patient reports IND with mobility at baseline. Patient is now limited by functional impairments (see PT problem list below) and requires CGA for transfers and gait with RW. Patient was able to ambulate 65 and 50 feet with RW and CGA to close S and cues for safe walker management. Patient educated on safe sequencing for stair mobility with retrograde stepping pattern and use of Rw, fall risk prevention, slowly increasing activity level, pain management and goal, use of ice man machine pt and spouse verbalized understanding of safe guarding position for people assisting with mobility. Patient instructed in exercises to facilitate ROM and circulation reviewed and HO provided. Patient will benefit from continued skilled PT interventions to address impairments and progress towards PLOF. Patient has met mobility goals at adequate level for discharge home with family support and OPPT services scheduled for Friday 1/30; will continue to follow if pt continues acute stay to progress towards Mod I goals.     If plan is discharge home, recommend the following: A little help with walking and/or transfers;A little help with bathing/dressing/bathroom;Assistance with cooking/housework;Assist for transportation;Help with stairs or ramp for entrance   Can travel by private vehicle        Equipment Recommendations Rolling walker (2 wheels)   Recommendations for Other Services       Functional Status Assessment Patient has had a recent decline in their functional status and demonstrates the ability to make significant improvements in function in a reasonable and predictable amount of time.     Precautions / Restrictions Precautions Precautions: Knee;Fall Restrictions Weight Bearing Restrictions Per Provider Order: No      Mobility  Bed Mobility Overal bed mobility: Needs Assistance Bed Mobility: Supine to Sit     Supine to sit: Supervision, HOB elevated     General bed mobility comments: min cues    Transfers Overall transfer level: Needs assistance Equipment used: Rolling walker (2 wheels) Transfers: Sit to/from Stand Sit to Stand: Contact guard assist           General transfer comment: min cues    Ambulation/Gait Ambulation/Gait assistance: Contact guard assist Gait Distance (Feet): 65 Feet Assistive device: Rolling walker (2 wheels) Gait Pattern/deviations: Step-to pattern, Decreased stance time - right, Antalgic, Trunk flexed Gait velocity: decreased     General Gait Details: slight trunk flexion with B UE support at RW to offload R LE in stance pahse,  min cues for safety, sequencing and RW management  Stairs Stairs: Yes Stairs assistance: Contact guard assist Stair Management: Two rails, Backwards, With walker Number of Stairs: 2 General stair comments: 2 steps with B handrails with cues for safety, sequencing and step to technique with min cues, to emulate home setting pt able to demonstrate abiltiy to perform retrograde stepping pattern with use of RW to ascend 2 steps with min cue and CGA with PT stabilizing Rw  Wheelchair Mobility     Tilt Bed  Modified Rankin (Stroke Patients Only)       Balance Overall balance assessment: Needs assistance Sitting-balance support: Feet supported Sitting balance-Leahy Scale: Good     Standing balance support: Bilateral upper extremity  supported, During functional activity, Reliant on assistive device for balance Standing balance-Leahy Scale: Fair Standing balance comment: static standing no UE support                             Pertinent Vitals/Pain Pain Assessment Pain Assessment: 0-10 Pain Score: 5  Pain Location: R knee and LE Pain Descriptors / Indicators: Aching, Constant, Discomfort, Dull, Grimacing, Operative site guarding Pain Intervention(s): Limited activity within patient's tolerance, Monitored during session, Premedicated before session, Repositioned, Ice applied    Home Living Family/patient expects to be discharged to:: Private residence Living Arrangements: Spouse/significant other Available Help at Discharge: Family Type of Home: House Home Access: Stairs to enter Entrance Stairs-Rails: None Entrance Stairs-Number of Steps: 2   Home Layout: One level Home Equipment: Cane - single point;Shower seat - built in      Prior Function Prior Level of Function : Independent/Modified Independent;Driving             Mobility Comments: IND no AD for all ADLs, self care tasks and IADLs       Extremity/Trunk Assessment        Lower Extremity Assessment Lower Extremity Assessment: RLE deficits/detail RLE Deficits / Details: ankle DF/PF 5/5: SLR < 10 degree lag RLE Sensation: decreased light touch (R foot)    Cervical / Trunk Assessment Cervical / Trunk Assessment: Back Surgery  Communication   Communication Communication: No apparent difficulties    Cognition Arousal: Alert Behavior During Therapy: WFL for tasks assessed/performed   PT - Cognitive impairments: No apparent impairments                         Following commands: Intact       Cueing       General Comments      Exercises Total Joint Exercises Ankle Circles/Pumps: AROM, Both, 10 reps Quad Sets: AROM, Right, 5 reps Short Arc Quad: AROM, Right, 5 reps Heel Slides: AROM, Right, 5 reps Hip  ABduction/ADduction: AROM, Right, 5 reps Straight Leg Raises: AROM, Right, 5 reps Knee Flexion: AROM, Right, 5 reps, Seated   Assessment/Plan    PT Assessment Patient needs continued PT services  PT Problem List Decreased strength;Decreased range of motion;Decreased activity tolerance;Decreased balance;Decreased mobility;Decreased coordination;Pain       PT Treatment Interventions DME instruction;Gait training;Stair training;Functional mobility training;Therapeutic activities;Therapeutic exercise;Balance training;Neuromuscular re-education;Patient/family education;Modalities    PT Goals (Current goals can be found in the Care Plan section)  Acute Rehab PT Goals Patient Stated Goal: to be able to walk further no pain or AD and do some exercise PT Goal Formulation: With patient Time For Goal Achievement: 12/05/24 Potential to Achieve Goals: Good    Frequency 7X/week     Co-evaluation               AM-PAC PT 6 Clicks Mobility  Outcome Measure Help needed turning from your back to your side while in a flat bed without using bedrails?: None Help needed moving from lying on your back to sitting on the side of a flat bed without using bedrails?: None Help needed moving to and from a bed to a chair (including a wheelchair)?: A Little Help needed standing up from a  chair using your arms (e.g., wheelchair or bedside chair)?: A Little Help needed to walk in hospital room?: A Little Help needed climbing 3-5 steps with a railing? : A Little 6 Click Score: 20    End of Session Equipment Utilized During Treatment: Gait belt Activity Tolerance: Patient tolerated treatment well;No increased pain Patient left: in chair;with family/visitor present Nurse Communication: Mobility status;Other (comment) (pt readiness for same day d/c from PT standpoint) PT Visit Diagnosis: Unsteadiness on feet (R26.81);Other abnormalities of gait and mobility (R26.89);Muscle weakness (generalized)  (M62.81);Difficulty in walking, not elsewhere classified (R26.2);Pain Pain - Right/Left: Right Pain - part of body: Knee;Leg    Time: 8863-8782 PT Time Calculation (min) (ACUTE ONLY): 41 min   Charges:   PT Evaluation $PT Eval Low Complexity: 1 Low PT Treatments $Gait Training: 8-22 mins $Therapeutic Exercise: 8-22 mins PT General Charges $$ ACUTE PT VISIT: 1 Visit         [place], PT Acute Rehab   [place] VEAR Drone 11/21/2024, 12:30 PM

## 2024-11-21 NOTE — Op Note (Signed)
 NAME: TEMILOLUWA LAREDO    MEDICAL RECORD NO.: [id number]   FACILITY: [facility]   DATE OF BIRTH: [birth year]  PHYSICIAN: Donnice BIRCH. Ernie, M.D.    DATE OF PROCEDURE: 11/21/2024    OPERATIVE REPORT   PREOPERATIVE DIAGNOSIS: right knee medial compartment osteoarthritis.   POSTOPERATIVE DIAGNOSIS: right knee medial compartment osteoarthritis.   PROCEDURE: right partial knee replacement utilizing [employer] fixed bearing knee system with a size right 3 femur, a right medial size F tibial tray with a size 8 mm insert.   SURGEON: Donnice BIRCH. Ernie, M.D.   ASSISTANT: Rosina Calin, [facility].  Please note that PA Calin was present for the entirety of the case,  utilized for preoperative positioning, perioperative retractor  management, general facilitation of the case and primary wound closure.   ANESTHESIA: regional plus spinal   SPECIMENS: None.   COMPLICATIONS: None.  DRAINS: None   TOURNIQUET TIME: no tourniquet was used   INDICATIONS FOR PROCEDURE: The patient is a 74 y.o. patient of mine who presented for evaluation of right knee pain.  They presented with primary complaints of pain on the medial side of their knee. Radiographs revealed advanced medial compartment arthritis with specifically an antero-medial wear pattern.  There was bone on bone changes noted with subchondral sclerosis and osteophytes present. The patient has had progressive problems failing to respond to conservative measures of medications, injections and activity modification. Risks of infection, DVT, component failure, need for future revision surgery were all discussed and reviewed.  Consent was obtained for benefit of pain relief.   PROCEDURE IN DETAIL: The patient was brought to the operative theater.  Once adequate anesthesia, preoperative antibiotics, 2 gm of Ancef  administered, 1 gm of Tranexamic Acid , and 10 mg of Decadron  given the patient was positioned in supine position with bony prominences padded  and protected.  The operative leg was placed into the [facility] leg holder. The right lower extremity was prepped and draped in sterile fashion. A time-out  was performed identifying the patient, planned procedure, and extremity.  A paramidline  incision was made from the proximal pole of the patella to the tibial tubercle. A soft tissue plane was created and partial median arthrotomy was then  made to allow for subluxation of the patella. Following initial synovectomy and  debridement, the osteophytes were removed off the medial aspect of the knee and within the notch as needed.  Using the extramedullary guide a proximal medial osteotomy was made referenced with a 4 mm stylus.  The was then brought to extension to confirm that the gap was stable with a 9 mm gauge.  At this point I sized the femur to be a 3.  The 3 femoral cutting guide was pinned into position and the appropriate cuts and drill hole made.  The tibia was now sized to be a F.  This trial tray was pinned into position the tibial lug holes drilled.  We now carried out a trial reduction and found that the 8 insert fit most appropriately per technique with the 2 mm feeler gauge placed.  Based on these findings the trial components were removed.  Final components were opened.The knee was irrigated with  normal saline solution.  The posterior medial synovial capsule was injected with 30 cc of quarter percent Marcaine  with epinephrine , 1 cc of Toradol , and 30 cc of normal saline.  Any final debridements of the soft tissue or osteophytes was carried out at this point.  Sclerotic bone on the distal femur  and proximal tibia were drilled for cementing.   The final components were cemented onto a clean and dried cut surfaces of bone. Excessive cement was removed and the knee was brought to extension with the 2 mm feeler gauge positioned until the cement cured. Excess cement was removed throughout the knee.   After the cement had fully cured any  remaining cement was removed throughout the knee there was no further visualized cement present.    The final size 8 insert to match the right F tibial tray was opened and then impacted into the tibial baseplate.   The extensor mechanism was then reapproximated using a #1 Vicryl and #1 Stratafix suture with the knee in flexion. The  remaining wound was closed with 2-0 Vicryl and a running 4-0 Monocryl.  The knee was cleaned, dried, and dressed sterilely using Dermabond and  Aquacel dressing. The patient  was brought to the recovery room, Ace wrap in place, tolerating the  procedure well.     Donnice CORDOBA Ernie, M.D.

## 2024-11-21 NOTE — Discharge Instructions (Signed)

## 2024-11-21 NOTE — Care Plan (Signed)
 Ortho Bundle Case Management Note  Patient Details  Name: [name] MRN: [id number] Date of Birth: August 27, 1951  Rt med [facility] on 11/21/24  DCP: Home with wife  DME: RW; ordered through [employer]  PT: OLIVA Chester                   DME Arranged:  Vannie rolling DME Agency:  [employer]  HH Arranged:    HH Agency:     Additional Comments: Please contact me with any questions of if this plan should need to change.  Burnard Dross, Case Manager [facility] [phone]  Ext. [phone]   11/21/2024, 8:11 AM

## 2024-11-21 NOTE — Anesthesia Procedure Notes (Signed)
 Spinal  Patient location during procedure: OR Start time: 11/21/2024 7:15 AM End time: 11/21/2024 7:18 AM Reason for block: surgical anesthesia  Staffing Performed: anesthesiologist  Authorized by: Maryclare Cornet, MD   Performed by: Maryclare Cornet, MD  Preanesthetic Checklist Completed: patient identified, IV checked, risks and benefits discussed, surgical consent, monitors and equipment checked, pre-op evaluation and timeout performed Spinal Block Patient position: sitting Prep: DuraPrep Patient monitoring: cardiac monitor, continuous pulse ox and blood pressure Approach: midline Location: L3-4 Injection technique: single-shot Needle Needle type: Pencan  Needle gauge: 24 G Needle length: 9 cm Assessment Sensory level: T10 Events: CSF return and second provider  Additional Notes Functioning IV was confirmed and monitors were applied. Sterile prep and drape, including hand hygiene and sterile gloves were used. The patient was positioned and the spine was prepped. The skin was anesthetized with lidocaine .  Free flow of clear CSF was obtained prior to injecting local anesthetic into the CSF.  The spinal needle aspirated freely following injection.  The needle was carefully withdrawn.  The patient tolerated the procedure well.

## 2024-11-21 NOTE — Interval H&P Note (Signed)
 History and Physical Interval Note:  11/21/2024 7:00 AM  [name]  has presented today for surgery, with the diagnosis of Right knee medial compartmental osteoarthritis.  The various methods of treatment have been discussed with the patient and family. After consideration of risks, benefits and other options for treatment, the patient has consented to  Procedures: ARTHROPLASTY, KNEE, UNICOMPARTMENTAL (Right) as a surgical intervention.  The patient's history has been reviewed, patient examined, no change in status, stable for surgery.  I have reviewed the patient's chart and labs.  Questions were answered to the patient's satisfaction.     [name]

## 2024-11-21 NOTE — H&P (Signed)
 PARTIAL KNEE ADMISSION H&P  Patient is being admitted for right partial knee arthroplasty.  Therapy Plans: outpatient therapy at [facility] Disposition: Home with wife Planned DVT Prophylaxis: aspirin  81mg  & effient  DME needed: walker PCP: Leita Mounts Cardio: Dr. Anner - clearance received TXA: IV Allergies: NKDA Anesthesia Concerns: none BMI: 31.5 Last HgbA1c: Not diabetic  Other: - SDD - ice machine at hospital - oxycodone , flexeril , tylenol   Subjective:  Chief Complaint: right knee pain.  HPI: [name], 74 y.o. male has a history of pain and functional disability in the right knee due to osteoarthritis and has failed non-surgical conservative treatments for greater than 12 weeks to include NSAID's and/or analgesics, corticosteriod injections, and activity modification. Onset of symptoms was gradual, starting 2 years ago with gradually worsening course since that time. The patient noted no past surgery on the right  knee.  Patient currently rates pain in the right knee at 8 out of 10 with activity. Patient has worsening of pain with activity and weight bearing and pain that interferes with activities of daily living. Patient has evidence of joint space narrowing by imaging studies. There is no active infection.  Patient Active Problem List   Diagnosis Date Noted   Primary osteoarthritis of right knee 09/25/2024   Coronary artery disease involving native coronary artery without angina pectoris 04/26/2024   Statin myopathy 04/26/2024   Need for pneumococcal 20-valent conjugate vaccination 03/21/2024   Presence of drug coated stent in LAD coronary artery 12/18/2023   Chronic musculoskeletal pain 12/18/2023   Aortic root dilation 12/18/2023   Hyperlipidemia with target low density lipoprotein (LDL) cholesterol less than 55 mg/dL 97/77/7974   CKD (chronic kidney disease), stage III (HCC) 12/06/2023   NSTEMI (non-ST elevated myocardial infarction) (HCC) 12/04/2023    LFT elevation 12/04/2023   Moderate recurrent major depression (HCC) 07/29/2023   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, sequela 11/11/2021   S/P left unicompartmental knee replacement 09/16/2021   Hypertensive renal disease 09/09/2021   DOE (dyspnea on exertion) 10/11/2019   Abnormal heart sounds 10/11/2019   Iron deficiency anemia 09/07/2019   Metabolic bone disease 04/25/2019   Renal stone 04/25/2019   Presence of left artificial knee joint 04/11/2019   Lumbar radiculopathy 12/07/2018   Insomnia 09/14/2018   S/P right knee arthroscopy 04/14/18    Absolute anemia 04/05/2017   Restless leg syndrome 12/07/2016   Numbness 12/07/2016   OSA (obstructive sleep apnea) 12/07/2016   Spinal stenosis of cervical region 12/07/2016   Spinal stenosis of lumbar region 06/18/2014   Medial meniscus, posterior horn derangement 12/20/2012   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   Stage 3 chronic kidney disease (HCC) 11/22/2011   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   Essential (primary) hypertension 11/22/2011    Past Medical History:  Diagnosis Date   Anemia    Anxiety    Arthritis    spinal stenosis   Chronic kidney disease    Depression    History of kidney stones    Hyperlipidemia    Hypertension    Kidney function abnormal 1983   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   Myocardial infarction (HCC)    Restless legs    Sleep apnea    uses Cpap nightly    Past Surgical History:  Procedure Laterality Date   BACK SURGERY     lumbar 2020   CATARACT EXTRACTION     10-15 years ago   COLONOSCOPY  05/25/2012  Procedure: COLONOSCOPY;  Surgeon: Claudis RAYMOND Rivet, MD;  Location: [facility];  Service: Endoscopy;  Laterality: N/A;  1030   COLONOSCOPY N/A 05/14/2017   Procedure: COLONOSCOPY;  Surgeon: Rivet Claudis RAYMOND, MD;  Location: [facility];  Service: Endoscopy;  Laterality: N/A;  10:10   CORONARY  PRESSURE/FFR WITH 3D MAPPING N/A 12/06/2023   Procedure: Coronary Pressure/FFR w/3D Mapping;  Surgeon: Anner Alm ORN, MD;  Location: [facility] INVASIVE CV LAB;  Service: Cardiovascular;  Laterality: N/A;   CORONARY STENT INTERVENTION N/A 12/06/2023   Procedure: CORONARY STENT INTERVENTION;  Surgeon: Anner Alm ORN, MD;  Location: [facility] INVASIVE CV LAB;  Service: Cardiovascular;  Laterality: N/A;   CYSTOSCOPY/RETROGRADE/URETEROSCOPY Right 06/07/2014   Procedure: CYSTOSCOPY, RIGHT RETROGRADE, RIGHT URETEROSCOPY, RIGHT URETERAL BALLOON DILATATION, BIOPSY, RIGHT URETERAL STENT;  Surgeon: Garnette CHRISTELLA Shack, MD;  Location: [facility];  Service: Urology;  Laterality: Right;   DRUG INDUCED ENDOSCOPY Right 07/21/2022   Procedure: DRUG INDUCED ENDOSCOPY;  Surgeon: Carlie Clark, MD;  Location: [facility];  Service: ENT;  Laterality: Right;   I & D EXTREMITY Left 05/01/2021   Procedure: LEFT INDEX AND LONG FINGER AMPUTATION AND RING FINGER NAIL BED REPAIR.;  Surgeon: Murrell Drivers, MD;  Location: [facility];  Service: Orthopedics;  Laterality: Left;  LEFT INDEX AND LONG FINGER AMPUTATION AND RING FINGER NAIL BED REPAIR.   IMPLANTATION OF HYPOGLOSSAL NERVE STIMULATOR Right 09/09/2022   Procedure: IMPLANTATION OF HYPOGLOSSAL NERVE STIMULATOR;  Surgeon: Carlie Clark, MD;  Location: [facility];  Service: ENT;  Laterality: Right;   kidney stone  1983   Had partial right nephrectomy   KNEE ARTHROSCOPY WITH MEDIAL MENISECTOMY Right 04/14/2018   Procedure: KNEE ARTHROSCOPY WITH MEDIAL MENISECTOMY;  Surgeon: Margrette Taft BRAVO, MD;  Location: [facility];  Service: Orthopedics;  Laterality: Right;   left foot surgery  2010   repair of joint   LEFT HEART CATH AND CORONARY ANGIOGRAPHY N/A 12/06/2023   Procedure: LEFT HEART CATH AND CORONARY ANGIOGRAPHY;  Surgeon: Anner Alm ORN, MD;  Location: [facility] INVASIVE CV LAB;  Service: Cardiovascular;  Laterality: N/A;   PARTIAL KNEE  ARTHROPLASTY Left 09/16/2021   Procedure: UNICOMPARTMENTAL KNEE MEDIALLY;  Surgeon: Ernie Cough, MD;  Location: [facility];  Service: Orthopedics;  Laterality: Left;   REPOSITION OF LENS Right 2024   WRIST SURGERY Bilateral     Prior to Admission medications  Medication Sig Start Date End Date Taking? Authorizing Provider  aspirin  EC 81 MG tablet Take 1 tablet (81 mg total) by mouth daily. Swallow whole. 12/08/23  Yes Gonfa, Taye T, MD  DULoxetine  (CYMBALTA ) 30 MG capsule TAKE TWO CAPSULES BY MOUTH EVERY DAY 08/25/24  Yes Bevely Doffing, FNP  Evolocumab  (REPATHA  SURECLICK) 140 MG/ML SOAJ Inject 140 mg into the skin every 14 (fourteen) days. 09/17/24  Yes Bevely Doffing, FNP  ferrous sulfate  325 (65 FE) MG tablet Take 1 tablet (325 mg total) by mouth 2 (two) times daily with a meal. Do not take with dairy products 08/04/24  Yes Cobb, Comer GAILS, NP  metoprolol  succinate (TOPROL -XL) 25 MG 24 hr tablet Take 0.5 tablets (12.5 mg total) by mouth daily. 11/09/24  Yes Bevely Doffing, FNP  Multiple Vitamin (MULTIVITAMIN WITH MINERALS) TABS tablet Take 1 tablet by mouth daily.   Yes [provider]  nitroGLYCERIN  (NITROSTAT ) 0.4 MG SL tablet Place 1 tablet (0.4 mg total) under the tongue every 5 (five) minutes as needed for chest pain. 12/10/23 11/10/24 Yes Anner Alm ORN, MD  prasugrel  (EFFIENT ) 10 MG TABS tablet Take 1 tablet (10 mg total) by mouth daily. 06/30/24  Yes Bevely Doffing, FNP  pregabalin (LYRICA) 150 MG capsule Take 150 mg by mouth 2 (two) times daily. 07/19/24 07/19/25 Yes [provider]  zolpidem  (AMBIEN ) 10 MG tablet Take 1 tablet (10 mg total) by mouth at bedtime. 11/15/24 02/13/25 Yes Bevely Doffing, FNP  traMADol  (ULTRAM ) 50 MG tablet Take 1 tablet (50 mg total) by mouth daily as needed for severe pain (pain score 7-10). 11/14/24   Bevely Doffing, FNP    Allergies[1]  Social History   Socioeconomic History   Marital status: Married    Spouse name: [name]    Number of children: Not on file   Years of education: 18   Highest education level: Master's degree (e.g., MA, MS, MEng, MEd, MSW, MBA)  Occupational History   Occupation: Retired    Comment: Environmental manager for [employer]  Tobacco Use   Smoking status: Former    Types: Gaffer exposure: Never   Smokeless tobacco: Never  Vaping Use   Vaping status: Never Used  Substance and Sexual Activity   Alcohol use: Yes    Comment: rare   Drug use: No   Sexual activity: Yes    Birth control/protection: None  Other Topics Concern   Not on file  Social History Narrative   He is married.  Has no children.  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     December 2020: He said he used to walk about 2 to 3 miles a day with his wife, but now she keeps going and he has to stop because of dyspnea.   Social Drivers of Health   Tobacco Use: Medium Risk (11/21/2024)   Patient History    Smoking Tobacco Use: Former    Smokeless Tobacco Use: Never    Passive Exposure: Never  Physicist, Medical Strain: Low Risk (09/25/2024)   Overall Financial Resource Strain (CARDIA)    Difficulty of Paying Living Expenses: Not hard at all  Food Insecurity: No Food Insecurity (09/25/2024)   Epic    Worried About Programme Researcher, Broadcasting/film/video in the Last Year: Never true    Ran Out of Food in the Last Year: Never true  Transportation Needs: No Transportation Needs (09/25/2024)   Epic    Lack of Transportation (Medical): No    Lack of Transportation (Non-Medical): No  Physical Activity: Insufficiently Active (09/25/2024)   Exercise Vital Sign    Days of Exercise per Week: 3 days    Minutes of Exercise per Session: 20 min  Stress: No Stress Concern Present (09/25/2024)   [employer] of Occupational Health - Occupational Stress Questionnaire    Feeling of Stress: Only a little  Social Connections: Moderately Isolated (09/25/2024)   Social Connection and Isolation Panel    Frequency of Communication with Friends and  Family: Three times a week    Frequency of Social Gatherings with Friends and Family: Once a week    Attends Religious Services: Never    Database Administrator or Organizations: No    Attends Banker Meetings: Not on file    Marital Status: Married  Intimate Partner Violence: Not At Risk (08/28/2024)   Epic    Fear of Current or Ex-Partner: No    Emotionally Abused: No    Physically Abused: No    Sexually Abused: No  Depression (PHQ2-9): Low Risk (08/28/2024)   Depression (PHQ2-9)  PHQ-2 Score: 0  Alcohol Screen: Low Risk (09/25/2024)   Alcohol Screen    Last Alcohol Screening Score (AUDIT): 1  Housing: Low Risk (09/25/2024)   Epic    Unable to Pay for Housing in the Last Year: No    Number of Times Moved in the Last Year: 0    Homeless in the Last Year: No  Utilities: Not At Risk (08/28/2024)   Epic    Threatened with loss of utilities: No  Health Literacy: Adequate Health Literacy (08/28/2024)   B1300 Health Literacy    Frequency of need for help with medical instructions: Never    Tobacco Use: Medium Risk (11/21/2024)   Patient History    Smoking Tobacco Use: Former    Smokeless Tobacco Use: Never    Passive Exposure: Never   Social History   Substance and Sexual Activity  Alcohol Use Yes   Comment: rare    Family History  Problem Relation Age of Onset   Cancer Father        Bladder cancer   Lung cancer Brother    Healthy Mother    Healthy Brother    Healthy Brother     Review Of Systems: Constitutional: Constitutional: no fever, chills, night sweats, or significant weight loss. Cardiovascular: Cardiovascular: no palpitations or chest pain. Respiratory: Respiratory: no cough or shortness of breath and No COPD. Gastrointestinal: Gastrointestinal: no vomiting or nausea. Musculoskeletal: Musculoskeletal: Joint Pain and swelling in Joints. Neurologic: Neurologic: no numbness, tingling, or difficulty with balance.  Objective:  Physical  Exam: Right knee exam: No effusion, warmth erythema No significant flexion contracture with tenderness medially Stable medial and lateral collateral ligaments Flexion 220 degrees Normal ipsilateral right hip exam without groin pain or referred pain Left knee exam: Well-healed surgical incision with full knee extension and flexion to 120 degrees  Vital signs in last 24 hours: Temp:  [98.4 F (36.9 C)] 98.4 F (36.9 C) (01/27 0555) Pulse Rate:  [81] 81 (01/27 0555) Resp:  [14] 14 (01/27 0555) BP: (125)/(73) 125/73 (01/27 0555) SpO2:  [98 %] 98 % (01/27 0555) Weight:  [93.9 kg] 93.9 kg (01/27 0543)  Imaging Review Plain radiographs demonstrate severe degenerative joint disease of the right knee.  The bone quality appears to be adequate for age and reported activity level.  Assessment/Plan:  End stage arthritis, right knee   The patient history, physical examination, clinical judgment of the provider and imaging studies are consistent with end stage degenerative joint disease of the right knee and partial knee arthroplasty is deemed medically necessary. The treatment options including medical management, injection therapy arthroscopy and arthroplasty were discussed at length. The risks and benefits of partial knee arthroplasty were presented and reviewed. The risks due to aseptic loosening, infection, stiffness, patella tracking problems, thromboembolic complications and other imponderables were discussed. The patient acknowledged the explanation, agreed to proceed with the plan and consent was signed. Patient is being admitted for inpatient treatment for surgery, pain control, PT, OT, prophylactic antibiotics, VTE prophylaxis, progressive ambulation and ADLs and discharge planning. The patient is planning to be discharged home.   Patient's anticipated LOS is less than 2 midnights, meeting these requirements: - Younger than 35 - Lives within 1 hour of care - Has a competent adult at home  to recover with post-op recover - NO history of  - Chronic pain requiring opiods  - Diabetes  - Coronary Artery Disease  - Heart failure  - Heart attack  - Stroke  - DVT/VTE  -  Cardiac arrhythmia  - Respiratory Failure/COPD  - Renal failure  - Anemia  - Advanced Liver disease    Rosina Calin, PA-C Orthopedic Surgery [facility] [place] [phone]      [1]  Allergies Allergen Reactions   Statins Other (See Comments)    Muscle pain   Nsaids Other (See Comments)    Cannot take because of my kidney disease.

## 2024-11-21 NOTE — Anesthesia Postprocedure Evaluation (Signed)
"   Anesthesia Post Note  Patient: [name]  Procedure(s) Performed: ARTHROPLASTY, KNEE, UNICOMPARTMENTAL (Right: Knee)     Patient location during evaluation: PACU Anesthesia Type: MAC, Spinal and Regional Level of consciousness: oriented and awake and alert Pain management: pain level controlled Vital Signs Assessment: post-procedure vital signs reviewed and stable Respiratory status: spontaneous breathing, respiratory function stable and patient connected to nasal cannula oxygen Cardiovascular status: blood pressure returned to baseline and stable Postop Assessment: no headache, no backache and no apparent nausea or vomiting Anesthetic complications: no   No notable events documented.  Last Vitals:  Vitals:   11/21/24 1230 11/21/24 1315  BP: (!) 150/83 133/81  Pulse: 77   Resp: 20 20  Temp: 36.6 C 36.6 C  SpO2: 100% 100%    Last Pain:  Vitals:   11/21/24 1315  TempSrc:   PainSc: 3                  Seila Liston S      "

## 2024-11-21 NOTE — Transfer of Care (Signed)
 Immediate Anesthesia Transfer of Care Note  Patient: [name]  Procedure(s) Performed: ARTHROPLASTY, KNEE, UNICOMPARTMENTAL (Right: Knee)  Patient Location: PACU  Anesthesia Type:Regional and Spinal  Level of Consciousness: drowsy and responds to stimulation  Airway & Oxygen Therapy: Patient Spontanous Breathing and Patient connected to face mask oxygen  Post-op Assessment: Report given to RN and Post -op Vital signs reviewed and stable  Post vital signs: Reviewed and stable  Last Vitals:  Vitals Value Taken Time  BP 102/58 11/21/24 08:47  Temp    Pulse 73 11/21/24 08:53  Resp 9 11/21/24 08:53  SpO2 100 % 11/21/24 08:53  Vitals shown include unfiled device data.  Last Pain:  Vitals:   11/21/24 0555  TempSrc: Oral         Complications: No notable events documented.

## 2024-11-21 NOTE — Anesthesia Procedure Notes (Signed)
 Anesthesia Regional Block: Adductor canal block   Pre-Anesthetic Checklist: , timeout performed,  Correct Patient, Correct Site, Correct Laterality,  Correct Procedure, Correct Position, site marked,  Risks and benefits discussed,  Surgical consent,  Pre-op evaluation,  At surgeon's request and post-op pain management  Laterality: Right  Prep: chloraprep       Needles:  Injection technique: Single-shot  Needle Type: Echogenic Needle     Needle Length: 9cm  Needle Gauge: 21     Additional Needles:   Narrative:  Start time: 11/21/2024 6:57 AM End time: 11/21/2024 7:01 AM Injection made incrementally with aspirations every 5 mL.  Performed by: Personally  Anesthesiologist: Maryclare Cornet, MD  Additional Notes: Pt tolerated the procedure well.

## 2024-11-22 ENCOUNTER — Encounter (HOSPITAL_COMMUNITY): Payer: Self-pay | Admitting: Orthopedic Surgery

## 2024-11-24 ENCOUNTER — Other Ambulatory Visit: Payer: Self-pay

## 2024-11-24 DIAGNOSIS — F321 Major depressive disorder, single episode, moderate: Secondary | ICD-10-CM

## 2024-12-05 ENCOUNTER — Ambulatory Visit (HOSPITAL_COMMUNITY)

## 2024-12-11 ENCOUNTER — Ambulatory Visit: Admitting: Pulmonary Disease

## 2025-01-03 ENCOUNTER — Ambulatory Visit: Payer: Self-pay | Admitting: Cardiology

## 2025-08-29 ENCOUNTER — Ambulatory Visit
